# Patient Record
Sex: Male | Born: 1955 | Race: White | Hispanic: No | Smoking: Former smoker
Health system: Southern US, Community
[De-identification: ages and names within clinical notes are randomized; demographics above are authoritative.]

## PROBLEM LIST (undated history)

## (undated) DIAGNOSIS — M1990 Unspecified osteoarthritis, unspecified site: Secondary | ICD-10-CM

## (undated) DIAGNOSIS — I5020 Unspecified systolic (congestive) heart failure: Secondary | ICD-10-CM

## (undated) DIAGNOSIS — I350 Nonrheumatic aortic (valve) stenosis: Secondary | ICD-10-CM

## (undated) DIAGNOSIS — I10 Essential (primary) hypertension: Secondary | ICD-10-CM

## (undated) DIAGNOSIS — I2510 Atherosclerotic heart disease of native coronary artery without angina pectoris: Secondary | ICD-10-CM

## (undated) DIAGNOSIS — J449 Chronic obstructive pulmonary disease, unspecified: Secondary | ICD-10-CM

## (undated) DIAGNOSIS — I509 Heart failure, unspecified: Secondary | ICD-10-CM

## (undated) DIAGNOSIS — I428 Other cardiomyopathies: Secondary | ICD-10-CM

## (undated) DIAGNOSIS — J45909 Unspecified asthma, uncomplicated: Secondary | ICD-10-CM

## (undated) DIAGNOSIS — R0600 Dyspnea, unspecified: Secondary | ICD-10-CM

## (undated) DIAGNOSIS — I499 Cardiac arrhythmia, unspecified: Secondary | ICD-10-CM

## (undated) DIAGNOSIS — R0602 Shortness of breath: Secondary | ICD-10-CM

## (undated) DIAGNOSIS — J189 Pneumonia, unspecified organism: Secondary | ICD-10-CM

## (undated) DIAGNOSIS — R011 Cardiac murmur, unspecified: Secondary | ICD-10-CM

## (undated) HISTORY — PX: CATARACT EXTRACTION: SUR2

## (undated) HISTORY — DX: Nonrheumatic aortic (valve) stenosis: I35.0

## (undated) HISTORY — DX: Shortness of breath: R06.02

## (undated) HISTORY — DX: Chronic obstructive pulmonary disease, unspecified: J44.9

---
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
## 2015-12-06 LAB — PULMONARY FUNCTION TEST

## 2015-12-19 ENCOUNTER — Telehealth: Payer: Self-pay | Admitting: Internal Medicine

## 2015-12-19 NOTE — Telephone Encounter (Signed)
Received records from [facility] for appointment on 01/19/16 with Dr Rennis Golden.  Records given to [facility] (medical records) for Dr Blanchie Dessert schedule on 01/19/16. lp

## 2016-01-19 ENCOUNTER — Encounter: Payer: Self-pay | Admitting: Internal Medicine

## 2016-01-19 ENCOUNTER — Telehealth: Payer: Self-pay | Admitting: Internal Medicine

## 2016-01-19 ENCOUNTER — Other Ambulatory Visit: Payer: Self-pay | Admitting: *Deleted

## 2016-01-19 ENCOUNTER — Ambulatory Visit
Admission: RE | Admit: 2016-01-19 | Discharge: 2016-01-19 | Disposition: A | Discharge status: Home / Self Care | Payer: BLUE CROSS/BLUE SHIELD | Source: Ambulatory Visit | Attending: Internal Medicine | Admitting: Internal Medicine

## 2016-01-19 ENCOUNTER — Ambulatory Visit (INDEPENDENT_AMBULATORY_CARE_PROVIDER_SITE_OTHER): Payer: BLUE CROSS/BLUE SHIELD | Admitting: Internal Medicine

## 2016-01-19 VITALS — BP 98/68 | HR 79 | Ht 69.5 in | Wt 168.6 lb

## 2016-01-19 DIAGNOSIS — Z01818 Encounter for other preprocedural examination: Secondary | ICD-10-CM

## 2016-01-19 DIAGNOSIS — I429 Cardiomyopathy, unspecified: Secondary | ICD-10-CM

## 2016-01-19 DIAGNOSIS — I350 Nonrheumatic aortic (valve) stenosis: Secondary | ICD-10-CM

## 2016-01-19 DIAGNOSIS — Z79899 Other long term (current) drug therapy: Secondary | ICD-10-CM

## 2016-01-19 DIAGNOSIS — R0602 Shortness of breath: Secondary | ICD-10-CM | POA: Diagnosis not present

## 2016-01-19 DIAGNOSIS — I428 Other cardiomyopathies: Secondary | ICD-10-CM | POA: Insufficient documentation

## 2016-01-19 DIAGNOSIS — R5383 Other fatigue: Secondary | ICD-10-CM

## 2016-01-19 DIAGNOSIS — I959 Hypotension, unspecified: Secondary | ICD-10-CM | POA: Insufficient documentation

## 2016-01-19 DIAGNOSIS — Z13220 Encounter for screening for lipoid disorders: Secondary | ICD-10-CM

## 2016-01-19 DIAGNOSIS — D689 Coagulation defect, unspecified: Secondary | ICD-10-CM

## 2016-01-19 DIAGNOSIS — I340 Nonrheumatic mitral (valve) insufficiency: Secondary | ICD-10-CM

## 2016-01-19 NOTE — Progress Notes (Signed)
OFFICE NOTE  Chief Complaint:  Shortness of breath  Primary Care Physician: Abner Greenspan, MD  HPI:  [name] is a 60 y.o. male he was kindly referred to me for evaluation of new onset shortness of breath. He saw his primary care provider who had been following him for an abnormal cardiac murmur in the past. In fact [name] had previously seen a cardiologist in [place], probably more than 5 years ago who did an echocardiogram and then she had valvular heart disease which could get worse over time. He had not followed up with his cardiologist. On presentation last month to his primary care provider he was notably short of breath and underwent a chest x-ray which showed some mild pulmonary edema as well as COPD changes. He underwent office spirometry which indicated obstruction and possible restriction with some bronchodilator reversibility and an FEV1 less than 80% predicted. Subsequently he was referred to an echocardiogram which was performed at [facility]. I reviewed the interpretation which indicates mild LV dilatation and mild global hypokinesis with an EF of 45-50%. There was marketed dilatation of the left atrium, mild aortic insufficiency and severe aortic stenosis. The peak and mean gradients across the valve or 71 mmHg and 50 mmHg, respectively with a calculated aortic valve area of 0.62 cm. There was also mild to moderate mitral regurgitation. The study was performed on 12/01/2015. [name], but is still able to do physical activities. He denies any chest pain. Family history indicates death by natural causes of family members but no known coronary disease.  PMHx:  Past Medical History  Diagnosis Date  . SOBOE (shortness of breath on exertion)   . Severe aortic stenosis   . COPD (chronic obstructive pulmonary disease) [facility])     Past Surgical History  Procedure Laterality Date  . Cataract extraction       FAMHx:  Family History  Problem Relation Age of Onset  . Breast cancer Mother   . Breast cancer Sister     SOCHx:   reports that he has been smoking.  He does not have any smokeless tobacco history on file. His alcohol and drug histories are not on file.  ALLERGIES:  No Known Allergies  ROS: Pertinent items noted in HPI and remainder of comprehensive ROS otherwise negative.  HOME MEDS: Current Outpatient Prescriptions  Medication Sig Dispense Refill  . albuterol (PROVENTIL) (5 MG/ML) 0.5% nebulizer solution Take 2.5 mg by nebulization 2 (two) times daily as needed for wheezing or shortness of breath.    Ailene Ards ELLIPTA 62.5-25 MCG/INH AEPB Take 1 Inhaler by mouth daily.  1  . aspirin 325 MG tablet Take 325 mg by mouth daily.    Marland Kitchen BREO ELLIPTA 100-25 MCG/INH AEPB USE 1 INHALATION DAILY  0  . doxycycline (VIBRA-TABS) 100 MG tablet Take 100 mg by mouth 2 (two) times daily. For 14 days  0  . finasteride (PROSCAR) 5 MG tablet Take 5 mg by mouth daily.  1  . tamsulosin (FLOMAX) 0.4 MG CAPS capsule Take 0.4 mg by mouth daily.  1   No current facility-administered medications for this visit.    LABS/IMAGING: No results found for this or any previous visit (from the past 48 hour(s)). No results found.  WEIGHTS: Wt Readings from Last 3 Encounters:  01/19/16 168 lb 9.6 oz (76.476 kg)    VITALS: BP 98/68 mmHg  Pulse 79  Ht 5' 9.5" (1.765  m)  Wt 168 lb 9.6 oz (76.476 kg)  BMI 24.55 kg/m2  EXAM: General appearance: alert, no distress and thin Neck: JVD - 3 cm above sternal notch and no carotid bruit Lungs: diminished breath sounds bilaterally and rales bibasilar Heart: regular rate and rhythm, S1: normal, S2: decreased intensity and systolic murmur: late systolic 3/6, crescendo at 2nd right intercostal space Abdomen: soft, non-tender; bowel sounds normal; no masses,  no organomegaly Extremities: extremities normal, atraumatic, no cyanosis or edema Pulses: 1+ pulses Skin:  Cool extremities Neurologic: Grossly normal Psych: Pleasant  EKG: Sinus rhythm with frequent PVCs at 79, left atrial enlargement, LVH with repolarization changes and poor R-wave progression anteriorly  ASSESSMENT: 1. 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 Mild to moderate mitral regurgitation with "marked" left atrial enlargement 3. COPD and ongoing tobacco abuse 4. Dilated cardiomyopathy with EF 45-50% 5. Arterial hypotension with decreased peripheral pulses, no claudication  PLAN: 1.   Mr. Nylund has severe aortic stenosis by clinical auscultation of his murmur and recent echocardiogram findings from [place]. I will request the images to review personally. I discussed the natural history of aortic stenosis in this case suspect that it may be either rheumatic or bicuspid etiology but clearly he has become symptomatic with this. He will need an expedited preoperative workup including left heart catheterization next week. We did discuss the risks and benefits of this procedure as well as alternatives and he is willing to proceed. He does appear mildly clinically volume overloaded on exam today however with his low blood pressure and hasn't added diuretic. I like to get lab work including a comprehensive metabolic profile, lipid profile, CBC, BNP and preoperative coagulation studies as soon as possible. He will need referral to CT surgery once we have completed preoperative workup. Advised that if his symptoms worsen over the next several days to weeks that he should present, hospital urgently for expedited workup.  Thanks for the kind referral.  Chrystie Nose, MD, [facility] Attending Cardiologist [facility]  Chrystie Nose 01/19/2016, 10:59 AM

## 2016-01-19 NOTE — Patient Instructions (Signed)
Your physician has requested that you have a cardiac catheterization @ [facility] - NEXT WEEK (with Dr. Rennis Golden preferably). Cardiac catheterization is used to diagnose and/or treat various heart conditions. Doctors may recommend this procedure for a number of different reasons. The most common reason is to evaluate chest pain. Chest pain can be a symptom of coronary artery disease (CAD), and cardiac catheterization can show whether plaque is narrowing or blocking your heart's arteries. This procedure is also used to evaluate the valves, as well as measure the blood flow and oxygen levels in different parts of your heart. For further information please visit [email or web address] Please follow instruction sheet, as given.  Following your catheterization, you will not be allowed to drive for 3 days.  No lifting, pushing, or pulling greater that 10 pounds is allowed for 1 week.  You will be required to have the following tests prior to the procedure:  1. Blood work - the blood work can be done no more than 14 days prior to the procedure.  It can be done at any [facility] lab. There is a lab downstairs on the first floor of this building in [address] and one at [address]. -- CMET, lipid, BNP, PT, PTT, CBC, TSH  2. Chest X-ray - this can be done at [facility] in the [facility] @ [address]

## 2016-01-19 NOTE — Telephone Encounter (Signed)
Wife called in - informed we have work note ready to be picked up. She states they checked the VM i left earlier but are back home. She requested that I email the work note to [email or web address]. Letter emailed per request. Advised to call back if not received by 4pm

## 2016-01-19 NOTE — Telephone Encounter (Signed)
Addressed in another telephone note.  

## 2016-01-19 NOTE — Telephone Encounter (Signed)
Returning your call. °

## 2016-01-19 NOTE — Telephone Encounter (Signed)
Called patient - no answer LM for wife that Dr. Rennis Golden wants him out of work at least 3 weeks and a letter stating this is ready to be picked up.

## 2016-01-20 LAB — LIPID PANEL
Cholesterol: 158 mg/dL (ref 125–200)
HDL: 67 mg/dL (ref >=40)
LDL Cholesterol: 80 mg/dL (ref <130)
Total CHOL/HDL Ratio: 2.4 Ratio (ref <=5.0)
Triglycerides: 55 mg/dL (ref <150)
VLDL: 11 mg/dL (ref <30)

## 2016-01-20 LAB — APTT: APTT: 30 s (ref 22–34)

## 2016-01-20 LAB — CBC
HCT: 49 % (ref 38.5–50.0)
HEMOGLOBIN: 16.4 g/dL (ref 13.2–17.1)
MCH: 32.6 pg (ref 27.0–33.0)
MCHC: 33.5 g/dL (ref 32.0–36.0)
MCV: 97.4 fL (ref 80.0–100.0)
MPV: 10.6 fL (ref 7.5–12.5)
PLATELETS: 152 10*3/uL (ref 140–400)
RBC: 5.03 MIL/uL (ref 4.20–5.80)
RDW: 13.8 % (ref 11.0–15.0)
WBC: 6.9 10*3/uL (ref 3.8–10.8)

## 2016-01-20 LAB — PROTIME-INR
INR: 1.1
Prothrombin Time: 11.2 s (ref 9.0–11.5)

## 2016-01-20 LAB — COMPREHENSIVE METABOLIC PANEL
ALBUMIN: 3.8 g/dL (ref 3.6–5.1)
ALT: 22 U/L (ref 9–46)
AST: 16 U/L (ref 10–35)
Alkaline Phosphatase: 38 U/L — ABNORMAL LOW (ref 40–115)
BILIRUBIN TOTAL: 0.6 mg/dL (ref 0.2–1.2)
BUN: 16 mg/dL (ref 7–25)
CO2: 25 mmol/L (ref 20–31)
CREATININE: 0.83 mg/dL (ref 0.70–1.25)
Calcium: 8.9 mg/dL (ref 8.6–10.3)
Chloride: 103 mmol/L (ref 98–110)
Glucose, Bld: 91 mg/dL (ref 65–99)
Potassium: 4.6 mmol/L (ref 3.5–5.3)
SODIUM: 140 mmol/L (ref 135–146)
TOTAL PROTEIN: 6.1 g/dL (ref 6.1–8.1)

## 2016-01-20 LAB — TSH: TSH: 2.38 m[IU]/L (ref 0.40–4.50)

## 2016-01-20 LAB — BRAIN NATRIURETIC PEPTIDE: BRAIN NATRIURETIC PEPTIDE: 1100.6 pg/mL — AB (ref <100)

## 2016-01-23 ENCOUNTER — Encounter (HOSPITAL_COMMUNITY)
Admission: AD | Disposition: A | Discharge status: Home / Self Care | Payer: Self-pay | Source: Ambulatory Visit | Attending: Cardiothoracic Surgery

## 2016-01-23 ENCOUNTER — Encounter (HOSPITAL_COMMUNITY): Payer: Self-pay | Admitting: General Practice

## 2016-01-23 ENCOUNTER — Other Ambulatory Visit: Payer: Self-pay | Admitting: *Deleted

## 2016-01-23 ENCOUNTER — Inpatient Hospital Stay (HOSPITAL_COMMUNITY): Payer: BLUE CROSS/BLUE SHIELD

## 2016-01-23 ENCOUNTER — Inpatient Hospital Stay (HOSPITAL_COMMUNITY)
Admission: AD | Admit: 2016-01-23 | Discharge: 2016-02-05 | DRG: 216 | Disposition: A | Discharge status: Home / Self Care | Payer: BLUE CROSS/BLUE SHIELD | Source: Ambulatory Visit | Attending: Cardiothoracic Surgery | Admitting: Cardiothoracic Surgery

## 2016-01-23 DIAGNOSIS — I429 Cardiomyopathy, unspecified: Secondary | ICD-10-CM | POA: Diagnosis not present

## 2016-01-23 DIAGNOSIS — N401 Enlarged prostate with lower urinary tract symptoms: Secondary | ICD-10-CM | POA: Diagnosis present

## 2016-01-23 DIAGNOSIS — Z9119 Patient's noncompliance with other medical treatment and regimen: Secondary | ICD-10-CM

## 2016-01-23 DIAGNOSIS — Q231 Congenital insufficiency of aortic valve: Secondary | ICD-10-CM

## 2016-01-23 DIAGNOSIS — F17200 Nicotine dependence, unspecified, uncomplicated: Secondary | ICD-10-CM | POA: Diagnosis present

## 2016-01-23 DIAGNOSIS — I481 Persistent atrial fibrillation: Secondary | ICD-10-CM | POA: Diagnosis not present

## 2016-01-23 DIAGNOSIS — I350 Nonrheumatic aortic (valve) stenosis: Secondary | ICD-10-CM | POA: Diagnosis not present

## 2016-01-23 DIAGNOSIS — J410 Simple chronic bronchitis: Secondary | ICD-10-CM | POA: Diagnosis not present

## 2016-01-23 DIAGNOSIS — I313 Pericardial effusion (noninflammatory): Secondary | ICD-10-CM | POA: Diagnosis present

## 2016-01-23 DIAGNOSIS — I959 Hypotension, unspecified: Secondary | ICD-10-CM | POA: Diagnosis not present

## 2016-01-23 DIAGNOSIS — D696 Thrombocytopenia, unspecified: Secondary | ICD-10-CM | POA: Diagnosis not present

## 2016-01-23 DIAGNOSIS — I509 Heart failure, unspecified: Secondary | ICD-10-CM

## 2016-01-23 DIAGNOSIS — R338 Other retention of urine: Secondary | ICD-10-CM | POA: Diagnosis present

## 2016-01-23 DIAGNOSIS — I480 Paroxysmal atrial fibrillation: Secondary | ICD-10-CM | POA: Diagnosis not present

## 2016-01-23 DIAGNOSIS — Z6823 Body mass index (BMI) 23.0-23.9, adult: Secondary | ICD-10-CM

## 2016-01-23 DIAGNOSIS — E43 Unspecified severe protein-calorie malnutrition: Secondary | ICD-10-CM | POA: Diagnosis present

## 2016-01-23 DIAGNOSIS — I4891 Unspecified atrial fibrillation: Secondary | ICD-10-CM

## 2016-01-23 DIAGNOSIS — I9589 Other hypotension: Secondary | ICD-10-CM | POA: Diagnosis present

## 2016-01-23 DIAGNOSIS — I5021 Acute systolic (congestive) heart failure: Secondary | ICD-10-CM

## 2016-01-23 DIAGNOSIS — D62 Acute posthemorrhagic anemia: Secondary | ICD-10-CM | POA: Diagnosis not present

## 2016-01-23 DIAGNOSIS — R64 Cachexia: Secondary | ICD-10-CM | POA: Diagnosis not present

## 2016-01-23 DIAGNOSIS — Z01818 Encounter for other preprocedural examination: Secondary | ICD-10-CM

## 2016-01-23 DIAGNOSIS — I2510 Atherosclerotic heart disease of native coronary artery without angina pectoris: Secondary | ICD-10-CM | POA: Diagnosis present

## 2016-01-23 DIAGNOSIS — R0602 Shortness of breath: Secondary | ICD-10-CM

## 2016-01-23 DIAGNOSIS — I352 Nonrheumatic aortic (valve) stenosis with insufficiency: Principal | ICD-10-CM | POA: Diagnosis present

## 2016-01-23 DIAGNOSIS — I340 Nonrheumatic mitral (valve) insufficiency: Secondary | ICD-10-CM | POA: Diagnosis present

## 2016-01-23 DIAGNOSIS — I9789 Other postprocedural complications and disorders of the circulatory system, not elsewhere classified: Secondary | ICD-10-CM | POA: Diagnosis not present

## 2016-01-23 DIAGNOSIS — Z952 Presence of prosthetic heart valve: Secondary | ICD-10-CM

## 2016-01-23 DIAGNOSIS — I472 Ventricular tachycardia: Secondary | ICD-10-CM | POA: Diagnosis not present

## 2016-01-23 DIAGNOSIS — N400 Enlarged prostate without lower urinary tract symptoms: Secondary | ICD-10-CM | POA: Diagnosis not present

## 2016-01-23 DIAGNOSIS — I428 Other cardiomyopathies: Secondary | ICD-10-CM

## 2016-01-23 DIAGNOSIS — J939 Pneumothorax, unspecified: Secondary | ICD-10-CM

## 2016-01-23 DIAGNOSIS — J449 Chronic obstructive pulmonary disease, unspecified: Secondary | ICD-10-CM | POA: Diagnosis present

## 2016-01-23 HISTORY — PX: CARDIAC CATHETERIZATION: SHX172

## 2016-01-23 HISTORY — DX: Atherosclerotic heart disease of native coronary artery without angina pectoris: I25.10

## 2016-01-23 HISTORY — DX: Other cardiomyopathies: I42.8

## 2016-01-23 LAB — ECHOCARDIOGRAM COMPLETE
AOPV: 0.14 m/s
AOVTI: 99.1 cm
AV Area VTI index: 0.31 cm2/m2
AV Area VTI: 0.57 cm2
AV VEL mean LVOT/AV: 0.14
AV peak Index: 0.3
AVAREAMEANV: 0.57 cm2
AVAREAMEANVIN: 0.3 cm2/m2
AVG: 48 mmHg
AVPG: 79 mmHg
AVPKVEL: 445 cm/s
CHL CUP AV VEL: 0.6
DOP CAL AO MEAN VELOCITY: 322 cm/s
FS: 13 % — AB (ref 28–44)
Height: 69.5 in
IVS/LV PW RATIO, ED: 0.97
LA ID, A-P, ES: 47 mm
LA diam end sys: 47 mm
LA diam index: 2.44 cm/m2
LA vol index: 49.5 mL/m2
LAVOL: 95.6 mL
LAVOLA4C: 81 mL
LV PW d: 10.4 mm — AB (ref 0.6–1.1)
LVOT SV: 59 mL
LVOT VTI: 14.3 cm
LVOT area: 4.15 cm2
LVOT diameter: 23 mm
LVOT peak VTI: 0.14 cm
LVOT peak vel: 61.3 cm/s
Lateral S' vel: 10.8 cm/s
RV TAPSE: 19.4 mm
Valve area index: 0.31
Valve area: 0.6 cm2
Weight: 2704 oz

## 2016-01-23 LAB — POCT I-STAT 3, VENOUS BLOOD GAS (G3P V)
BICARBONATE: 24.6 meq/L — AB (ref 20.0–24.0)
O2 SAT: 58 %
PO2 VEN: 31 mmHg (ref 31.0–45.0)
TCO2: 26 mmol/L (ref 0–100)
pCO2, Ven: 40.8 mmHg — ABNORMAL LOW (ref 45.0–50.0)
pH, Ven: 7.388 — ABNORMAL HIGH (ref 7.250–7.300)

## 2016-01-23 LAB — CBC
HCT: 46 % (ref 39.0–52.0)
Hemoglobin: 15.5 g/dL (ref 13.0–17.0)
MCH: 32.6 pg (ref 26.0–34.0)
MCHC: 33.7 g/dL (ref 30.0–36.0)
MCV: 96.6 fL (ref 78.0–100.0)
PLATELETS: 125 10*3/uL — AB (ref 150–400)
RBC: 4.76 MIL/uL (ref 4.22–5.81)
RDW: 13.5 % (ref 11.5–15.5)
WBC: 5 10*3/uL (ref 4.0–10.5)

## 2016-01-23 LAB — URINALYSIS, ROUTINE W REFLEX MICROSCOPIC
Bilirubin Urine: NEGATIVE
Glucose, UA: NEGATIVE mg/dL
Hgb urine dipstick: NEGATIVE
Ketones, ur: NEGATIVE mg/dL
Leukocytes, UA: NEGATIVE
Nitrite: NEGATIVE
Protein, ur: NEGATIVE mg/dL
Specific Gravity, Urine: 1.018 (ref 1.005–1.030)
pH: 5 (ref 5.0–8.0)

## 2016-01-23 LAB — CREATININE, SERUM: CREATININE: 0.69 mg/dL (ref 0.61–1.24)

## 2016-01-23 LAB — POCT I-STAT 3, ART BLOOD GAS (G3+)
Acid-base deficit: 3 mmol/L — ABNORMAL HIGH (ref 0.0–2.0)
Bicarbonate: 21.5 mEq/L (ref 20.0–24.0)
O2 SAT: 91 %
PCO2 ART: 36.1 mmHg (ref 35.0–45.0)
TCO2: 23 mmol/L (ref 0–100)
pH, Arterial: 7.383 (ref 7.350–7.450)
pO2, Arterial: 62 mmHg — ABNORMAL LOW (ref 80.0–100.0)

## 2016-01-23 LAB — SURGICAL PCR SCREEN
MRSA, PCR: NEGATIVE
Staphylococcus aureus: NEGATIVE

## 2016-01-23 SURGERY — RIGHT/LEFT HEART CATH AND CORONARY ANGIOGRAPHY

## 2016-01-23 MED ORDER — SODIUM CHLORIDE 0.9 % IV SOLN: 250.0000 mL | INTRAVENOUS | Status: DC | PRN

## 2016-01-23 MED ORDER — ENSURE ENLIVE PO LIQD
237.0000 mL | Freq: Two times a day (BID) | ORAL | Status: DC
  Administered 2016-01-24 (×2): 237 mL via ORAL

## 2016-01-23 MED ORDER — SODIUM CHLORIDE 0.9% FLUSH
3.0000 mL | Freq: Two times a day (BID) | INTRAVENOUS | Status: DC
  Administered 2016-01-24 – 2016-01-28 (×9): 3 mL via INTRAVENOUS

## 2016-01-23 MED ORDER — SODIUM CHLORIDE 0.9% FLUSH: 3.0000 mL | INTRAVENOUS | Status: DC | PRN

## 2016-01-23 MED ORDER — ASPIRIN 81 MG PO CHEW
81.0000 mg | CHEWABLE_TABLET | Freq: Every day | ORAL | Status: DC
  Administered 2016-01-24 – 2016-01-28 (×5): 81 mg via ORAL
  Filled 2016-01-23 (×5): qty 1

## 2016-01-23 MED ORDER — IOPAMIDOL (ISOVUE-370) INJECTION 76%
INTRAVENOUS | Status: AC
  Filled 2016-01-23: qty 100

## 2016-01-23 MED ORDER — VERAPAMIL HCL 2.5 MG/ML IV SOLN
INTRAVENOUS | Status: AC
  Filled 2016-01-23: qty 2

## 2016-01-23 MED ORDER — PNEUMOCOCCAL VAC POLYVALENT 25 MCG/0.5ML IJ INJ
0.5000 mL | INJECTION | INTRAMUSCULAR | Status: AC
  Administered 2016-01-24: 0.5 mL via INTRAMUSCULAR
  Filled 2016-01-23: qty 0.5

## 2016-01-23 MED ORDER — SODIUM CHLORIDE 0.9% FLUSH
3.0000 mL | Freq: Two times a day (BID) | INTRAVENOUS | Status: DC
  Administered 2016-01-23 – 2016-01-27 (×6): 3 mL via INTRAVENOUS

## 2016-01-23 MED ORDER — SODIUM CHLORIDE 0.9% FLUSH
3.0000 mL | Freq: Two times a day (BID) | INTRAVENOUS | Status: DC
  Administered 2016-01-24 – 2016-01-27 (×5): 3 mL via INTRAVENOUS

## 2016-01-23 MED ORDER — TAMSULOSIN HCL 0.4 MG PO CAPS
0.4000 mg | ORAL_CAPSULE | Freq: Every day | ORAL | Status: DC
  Administered 2016-01-23 – 2016-01-25 (×3): 0.4 mg via ORAL
  Filled 2016-01-23 (×3): qty 1

## 2016-01-23 MED ORDER — HEPARIN (PORCINE) IN NACL 2-0.9 UNIT/ML-% IJ SOLN
INTRAMUSCULAR | Status: AC
  Filled 2016-01-23: qty 1000

## 2016-01-23 MED ORDER — FINASTERIDE 5 MG PO TABS
5.0000 mg | ORAL_TABLET | Freq: Every day | ORAL | Status: DC
  Administered 2016-01-23 – 2016-02-05 (×13): 5 mg via ORAL
  Filled 2016-01-23 (×13): qty 1

## 2016-01-23 MED ORDER — ALBUTEROL SULFATE (2.5 MG/3ML) 0.083% IN NEBU: 2.5000 mg | INHALATION_SOLUTION | Freq: Two times a day (BID) | RESPIRATORY_TRACT | Status: DC | PRN

## 2016-01-23 MED ORDER — LIDOCAINE HCL (PF) 1 % IJ SOLN
INTRAMUSCULAR | Status: AC
  Filled 2016-01-23: qty 30

## 2016-01-23 MED ORDER — ONDANSETRON HCL 4 MG/2ML IJ SOLN: 4.0000 mg | Freq: Four times a day (QID) | INTRAMUSCULAR | Status: DC | PRN

## 2016-01-23 MED ORDER — HEPARIN (PORCINE) IN NACL 2-0.9 UNIT/ML-% IJ SOLN
INTRAMUSCULAR | Status: DC | PRN
  Administered 2016-01-23: 1500 mL

## 2016-01-23 MED ORDER — LIDOCAINE HCL (PF) 1 % IJ SOLN
INTRAMUSCULAR | Status: DC | PRN
  Administered 2016-01-23: 10 mL
  Administered 2016-01-23: 20 mL
  Administered 2016-01-23: 1 mL
  Administered 2016-01-23: 20 mL
  Administered 2016-01-23: 2 mL

## 2016-01-23 MED ORDER — SODIUM CHLORIDE 0.9 % IV SOLN
INTRAVENOUS | Status: DC
Start: 2016-01-23 — End: 2016-01-23
  Administered 2016-01-23: 09:00:00 via INTRAVENOUS

## 2016-01-23 MED ORDER — FENTANYL CITRATE (PF) 100 MCG/2ML IJ SOLN
INTRAMUSCULAR | Status: DC | PRN
  Administered 2016-01-23: 25 ug via INTRAVENOUS

## 2016-01-23 MED ORDER — UMECLIDINIUM BROMIDE 62.5 MCG/INH IN AEPB
1.0000 | INHALATION_SPRAY | Freq: Every day | RESPIRATORY_TRACT | Status: DC
  Filled 2016-01-23: qty 7

## 2016-01-23 MED ORDER — UMECLIDINIUM BROMIDE 62.5 MCG/INH IN AEPB
1.0000 | INHALATION_SPRAY | Freq: Every day | RESPIRATORY_TRACT | Status: DC
  Administered 2016-01-24 – 2016-02-05 (×12): 1 via RESPIRATORY_TRACT
  Filled 2016-01-23 (×3): qty 7

## 2016-01-23 MED ORDER — FENTANYL CITRATE (PF) 100 MCG/2ML IJ SOLN
INTRAMUSCULAR | Status: AC
  Filled 2016-01-23: qty 2

## 2016-01-23 MED ORDER — FLUTICASONE FUROATE-VILANTEROL 100-25 MCG/INH IN AEPB
1.0000 | INHALATION_SPRAY | Freq: Every day | RESPIRATORY_TRACT | Status: DC
  Filled 2016-01-23: qty 28

## 2016-01-23 MED ORDER — FLUTICASONE FUROATE-VILANTEROL 100-25 MCG/INH IN AEPB
1.0000 | INHALATION_SPRAY | Freq: Every day | RESPIRATORY_TRACT | Status: DC
  Administered 2016-01-24 – 2016-02-05 (×12): 1 via RESPIRATORY_TRACT
  Filled 2016-01-23 (×3): qty 28

## 2016-01-23 MED ORDER — HEPARIN SODIUM (PORCINE) 5000 UNIT/ML IJ SOLN
5000.0000 [IU] | Freq: Three times a day (TID) | INTRAMUSCULAR | Status: DC
  Administered 2016-01-23 – 2016-01-28 (×16): 5000 [IU] via SUBCUTANEOUS
  Filled 2016-01-23 (×16): qty 1

## 2016-01-23 MED ORDER — ASPIRIN 81 MG PO CHEW: 81.0000 mg | CHEWABLE_TABLET | ORAL | Status: DC

## 2016-01-23 MED ORDER — ACETAMINOPHEN 325 MG PO TABS: 650.0000 mg | ORAL_TABLET | ORAL | Status: DC | PRN

## 2016-01-23 MED ORDER — FUROSEMIDE 10 MG/ML IJ SOLN
40.0000 mg | Freq: Two times a day (BID) | INTRAMUSCULAR | Status: DC
  Administered 2016-01-23 – 2016-01-24 (×2): 40 mg via INTRAVENOUS
  Filled 2016-01-23 (×3): qty 4

## 2016-01-23 MED ORDER — IOPAMIDOL (ISOVUE-370) INJECTION 76%
INTRAVENOUS | Status: DC | PRN
  Administered 2016-01-23: 60 mL via INTRAVENOUS

## 2016-01-23 SURGICAL SUPPLY — 18 items
CATH BALLN WEDGE 5F 110CM (CATHETERS) ×2 IMPLANT
CATH INFINITI 5FR MULTPACK ANG (CATHETERS) ×2 IMPLANT
CATH SWAN GANZ 7F STRAIGHT (CATHETERS) ×2 IMPLANT
COVER PRB 48X5XTLSCP FOLD TPE (BAG) ×1 IMPLANT
COVER PROBE 5X48 (BAG) ×1
GLIDESHEATH SLEND SS 6F .021 (SHEATH) ×2 IMPLANT
KIT HEART LEFT (KITS) ×2 IMPLANT
NEEDLE SMART REG 18GX2-3/4 (NEEDLE) ×2 IMPLANT
PACK CARDIAC CATHETERIZATION (CUSTOM PROCEDURE TRAY) ×2 IMPLANT
SHEATH FAST CATH BRACH 5F 5CM (SHEATH) ×2 IMPLANT
SHEATH PINNACLE 5F 10CM (SHEATH) ×2 IMPLANT
SHEATH PINNACLE 7F 10CM (SHEATH) ×2 IMPLANT
SYR MEDRAD MARK V 150ML (SYRINGE) IMPLANT
TRANSDUCER W/STOPCOCK (MISCELLANEOUS) ×2 IMPLANT
TUBING CIL FLEX 10 FLL-RA (TUBING) ×2 IMPLANT
WIRE EMERALD 3MM-J .025X260CM (WIRE) ×2 IMPLANT
WIRE EMERALD 3MM-J .035X150CM (WIRE) ×2 IMPLANT
WIRE SAFE-T 1.5MM-J .035X260CM (WIRE) ×2 IMPLANT

## 2016-01-23 NOTE — Plan of Care (Signed)
Problem: Phase I Progression Outcomes Goal: Voiding-avoid urinary catheter unless indicated Outcome: Completed/Met Date Met: 01/23/16 Pt voiding well in urinal. Pt has voided about 2L this shift.  Goal: Vascular site scale level 0 - I Vascular Site Scale Level 0: No bruising/bleeding/hematoma Level I (Mild): Bruising/Ecchymosis, minimal bleeding/ooozing, palpable hematoma < 3 cm Level II (Moderate): Bleeding not affecting hemodynamic parameters, pseudoaneurysm, palpable hematoma > 3 cm Level III  (Severe) Bleeding which affects hemodynamic parameters or retroperitoneal hemorrhage   Outcome: Completed/Met Date Met: 01/23/16 Pt's groin is a level 0. Pt's dressing was removed & a bandaid was placed.

## 2016-01-23 NOTE — Progress Notes (Signed)
5 FR Arterial sheath and 7 FR venous sheath removed from right femoral area without complication. Groin is a level zero at this time and patient has been given instructions. Site bandage applied at this time.

## 2016-01-23 NOTE — Progress Notes (Signed)
  Echocardiogram 2D Echocardiogram has been performed.  Delcie Roch 01/23/2016, 4:25 PM

## 2016-01-23 NOTE — Plan of Care (Signed)
Problem: Phase I Progression Outcomes Goal: Voiding-avoid urinary catheter unless indicated Outcome: Completed/Met Date Met: 01/23/16 Pt is voiding well in his urinal. Goal: Vascular site scale level 0 - I Vascular Site Scale Level 0: No bruising/bleeding/hematoma Level I (Mild): Bruising/Ecchymosis, minimal bleeding/ooozing, palpable hematoma < 3 cm Level II (Moderate): Bleeding not affecting hemodynamic parameters, pseudoaneurysm, palpable hematoma > 3 cm Level III  (Severe) Bleeding which affects hemodynamic parameters or retroperitoneal hemorrhage   Outcome: Completed/Met Date Met: 01/23/16 Pt's vascular site is a level 0. Pt's gauze was removed &  A band-aid was placed.

## 2016-01-23 NOTE — Progress Notes (Signed)
Patient blood pressure while laying in bed with head of bed elevated 83/57, retake was 89/65.  RN entered room to assess and patient had just got done using urinal (stood beside bed to use) and was sitting on side of bed when RN entered room.  Blood pressure taken and was 92/62.  Patient asymptomatic denies lightheadedness and dizziness even with position changes.  Azeem on call with Cardiology paged and returned page, was updated of all information in this note.  No new orders at this time.  Will continue to monitor.

## 2016-01-23 NOTE — H&P (Signed)
     INTERVAL PROCEDURE H&P  History and Physical Interval Note:  01/23/2016 8:48 AM  [name] has presented today for their planned procedure. The various methods of treatment have been discussed with the patient and family. After consideration of risks, benefits and other options for treatment, the patient has consented to the procedure.  The patients' outpatient history has been reviewed, patient examined, and no change in status from most recent office note within the past 30 days. I have reviewed the patients' chart and labs and will proceed as planned. Questions were answered to the patient's satisfaction.   Chrystie Nose, MD, [facility] Attending Cardiologist [facility]  Chrystie Nose 01/23/2016, 8:48 AM

## 2016-01-24 ENCOUNTER — Inpatient Hospital Stay (HOSPITAL_COMMUNITY): Payer: BLUE CROSS/BLUE SHIELD

## 2016-01-24 ENCOUNTER — Encounter (HOSPITAL_COMMUNITY): Payer: Self-pay | Admitting: Cardiology

## 2016-01-24 DIAGNOSIS — I5021 Acute systolic (congestive) heart failure: Secondary | ICD-10-CM

## 2016-01-24 DIAGNOSIS — I959 Hypotension, unspecified: Secondary | ICD-10-CM

## 2016-01-24 DIAGNOSIS — I429 Cardiomyopathy, unspecified: Secondary | ICD-10-CM

## 2016-01-24 DIAGNOSIS — I350 Nonrheumatic aortic (valve) stenosis: Secondary | ICD-10-CM

## 2016-01-24 DIAGNOSIS — I2510 Atherosclerotic heart disease of native coronary artery without angina pectoris: Secondary | ICD-10-CM | POA: Diagnosis present

## 2016-01-24 DIAGNOSIS — J449 Chronic obstructive pulmonary disease, unspecified: Secondary | ICD-10-CM | POA: Diagnosis present

## 2016-01-24 DIAGNOSIS — N400 Enlarged prostate without lower urinary tract symptoms: Secondary | ICD-10-CM | POA: Diagnosis present

## 2016-01-24 LAB — BASIC METABOLIC PANEL
Anion gap: 8 (ref 5–15)
BUN: 22 mg/dL — ABNORMAL HIGH (ref 6–20)
CALCIUM: 9.3 mg/dL (ref 8.9–10.3)
CO2: 25 mmol/L (ref 22–32)
CREATININE: 0.69 mg/dL (ref 0.61–1.24)
Chloride: 106 mmol/L (ref 101–111)
GFR calc non Af Amer: >60 mL/min (ref >60)
Glucose, Bld: 104 mg/dL — ABNORMAL HIGH (ref 65–99)
Potassium: 4.1 mmol/L (ref 3.5–5.1)
SODIUM: 139 mmol/L (ref 135–145)

## 2016-01-24 MED ORDER — MORPHINE SULFATE (PF) 4 MG/ML IV SOLN: 4.0000 mg | INTRAVENOUS | Status: DC | PRN

## 2016-01-24 MED ORDER — ENSURE ENLIVE PO LIQD
237.0000 mL | Freq: Three times a day (TID) | ORAL | Status: DC
  Administered 2016-01-24 – 2016-01-28 (×10): 237 mL via ORAL

## 2016-01-24 MED FILL — Verapamil HCl IV Soln 2.5 MG/ML: INTRAVENOUS | Qty: 2 | Status: AC

## 2016-01-24 NOTE — Progress Notes (Signed)
CARDIAC REHAB PHASE I   PRE:  Rate/Rhythm: 68 SR c/ PVCs  BP:  Sitting: 93/73        SaO2: 95 RA  MODE:  Ambulation: 550 ft   POST:  Rate/Rhythm: 93 SR c/ PVCs  BP:  Sitting: 97/76         SaO2: 94 RA  Pt ambulated 550 ft on RA, assist x1, steady gait, tolerated well. Pt c/o mild DOE, states it is much improved, denies any other complaints, appreciative of walk. Cardiac surgery pre-op education completed with pt and wife at bedside. Reviewed IS, sternal precautions, activity progression, cardiac surgery booklet and cardiac surgery guidelines. Left instructions to view cardiac surgery videos as well. Pt and wife verbalized understanding. Pt to recliner after walk, call bell within reach. Will follow.   [id number] Joylene Grapes, RN, BSN 01/24/2016 11:55 AM

## 2016-01-24 NOTE — Progress Notes (Signed)
Initial Nutrition Assessment  DOCUMENTATION CODES:   Severe malnutrition in context of chronic illness  INTERVENTION:    Ensure Enlive PO TID, each supplement provides 350 kcal and 20 grams of protein  NUTRITION DIAGNOSIS:   Malnutrition related to chronic illness as evidenced by severe depletion of body fat, severe depletion of muscle mass, percent weight loss (15%).  GOAL:   Patient will meet greater than or equal to 90% of their needs  MONITOR:   PO intake, Supplement acceptance, Weight trends, Labs, I & O's  REASON FOR ASSESSMENT:   Malnutrition Screening Tool    ASSESSMENT:   60 year old male who presented to the hospital on 7/25 with severe aortic stenosis, class four CHF with severe LV dysfunction and no significant coronary artery disease.   Patient reports that he has been losing weight for the past 6 months or so. He has been eating well, but continues to lose weight. He has had 15% weight loss over the past 6 months. Patient with good intake of meals today. Wife is bringing in food from home. Discussed ways to increase protein intake. Patient likes Ensure.  Nutrition-Focused physical exam completed. Findings are severe fat depletion, severe muscle depletion, and no edema.  Patient with severe PCM. Labs reviewed. Medications reviewed and include lasix, flomax.  Diet Order:  Diet Heart Room service appropriate? Yes; Fluid consistency: Thin  Skin:  Reviewed, no issues  Last BM:  7/25  Height:   Ht Readings from Last 1 Encounters:  01/23/16 5' 9.5" (1.765 m)    Weight:   Wt Readings from Last 1 Encounters:  01/24/16 162 lb 1.6 oz (73.5 kg)    Ideal Body Weight:  74.1 kg  BMI:  Body mass index is 23.59 kg/m.  Estimated Nutritional Needs:   Kcal:  2000-2200  Protein:  100-110 gm  Fluid:  2 L  EDUCATION NEEDS:   No education needs identified at this time  Joaquin Courts, RD, LDN, CNSC Pager [phone] After Hours Pager [phone]

## 2016-01-24 NOTE — Progress Notes (Signed)
Subjective:  SOB improved from admission  Objective:  Vital Signs in the last 24 hours: Temp:  [97.6 F (36.4 C)-98.2 F (36.8 C)] 97.6 F (36.4 C) (07/26 0413) Pulse Rate:  [0-93] 76 (07/26 0413) Resp:  [0-35] 13 (07/26 0413) BP: (83-118)/(57-88) 94/66 (07/26 0413) SpO2:  [0 %-96 %] 92 % (07/26 0413) Weight:  [162 lb 1.6 oz (73.5 kg)] 162 lb 1.6 oz (73.5 kg) (07/26 0413)  Intake/Output from previous day:  Intake/Output Summary (Last 24 hours) at 01/24/16 0834 Last data filed at 01/24/16 0414  Gross per 24 hour  Intake              240 ml  Output             1825 ml  Net            -1585 ml    Physical Exam: General appearance: alert, cooperative, cachectic and no distress Lungs: decreased breath sounds, few crackles Lt base Heart: regular rate and rhythm and 2/6 systolic murmur, decfeased S2 Extremities: no edema Skin: Skin color, texture, turgor normal. No rashes or lesions Neurologic: Grossly normal   Rate: 76  Rhythm: normal sinus rhythm, premature ventricular contractions (PVC) and 4 bt NSVT  Lab Results:  Recent Labs  01/23/16 1436  WBC 5.0  HGB 15.5  PLT 125*    Recent Labs  01/23/16 1436 01/24/16 0539  NA  --  139  K  --  4.1  CL  --  106  CO2  --  25  GLUCOSE  --  104*  BUN  --  22*  CREATININE 0.69 0.69   No results for input(s): TROPONINI in the last 72 hours.  Invalid input(s): CK, MB No results for input(s): INR in the last 72 hours.  Scheduled Meds: . aspirin  81 mg Oral Daily  . feeding supplement (ENSURE ENLIVE)  237 mL Oral BID BM  . finasteride  5 mg Oral Daily  . fluticasone furoate-vilanterol  1 puff Inhalation Daily  . furosemide  40 mg Intravenous BID  . heparin  5,000 Units Subcutaneous Q8H  . sodium chloride flush  3 mL Intravenous Q12H  . sodium chloride flush  3 mL Intravenous Q12H  . sodium chloride flush  3 mL Intravenous Q12H  . tamsulosin  0.4 mg Oral Daily  . umeclidinium bromide  1 puff Inhalation Daily    Continuous Infusions:  PRN Meds:.sodium chloride, sodium chloride, sodium chloride, acetaminophen, albuterol, ondansetron (ZOFRAN) IV, sodium chloride flush, sodium chloride flush, sodium chloride flush   Imaging: Imaging results have been reviewed  Cardiac Studies: Echo 01/23/16 Study Conclusions  - Left ventricle: The cavity size was severely dilated. Systolic   function was moderately to severely reduced. The estimated   ejection fraction was in the range of 30% to 35%. Diffuse   hypokinesis. - Aortic valve: Valve mobility was restricted. There was severe   stenosis. There was moderate regurgitation. Peak velocity (S):   445 cm/s. Mean gradient (S): 48 mm Hg. Valve area (VTI): 0.6   cm^2. Valve area (Vmax): 0.57 cm^2. Valve area (Vmean): 0.57   cm^2. - Mitral valve: Transvalvular velocity was within the normal range.   There was no evidence for stenosis. There was mild regurgitation. - Left atrium: The atrium was severely dilated. - Right ventricle: The cavity size was normal. Wall thickness was   normal. Systolic function was normal. - Right atrium: The atrium was mildly dilated. - Tricuspid valve: There was  no regurgitation. - Inferior vena cava: The vessel was normal in size. The   respirophasic diameter changes were in the normal range (>= 50%),   consistent with normal central venous pressure. - Pericardium, extracardiac: A trivial pericardial effusion was   identified. There was a large left pleural effusion.   Assessment/Plan:   60 y.o. Caucasian male with a history of COPD, AS, and cardiomyopathy, refered to Dr Rennis Golden for new onset dyspnea. Pt noted to be in pulmonary edema, documented by CXR and BNP. He has diuresed 1.5L -7 lbs. Chronic low B/P has otherwise limited medical Rx. Cath done 01/23/16 showed minor CAD, echo showed EF 30-35% with severe AS.   Principal Problem:   Acute systolic (congestive) heart failure (HCC) Active Problems:   Severe aortic stenosis    Non-ischemic cardiomyopathy (HCC)   COPD (chronic obstructive pulmonary disease) (HCC)   Arterial hypotension   CAD- minor CAD at cath    BPH (benign prostatic hyperplasia)   PLAN: MD to see- pt is to get another dose of IV Lasix this am.  Corine Shelter PA-C 01/24/2016, 8:34 AM [phone]

## 2016-01-24 NOTE — Progress Notes (Signed)
1 Day Post-Op Procedure(s) (LRB): Right/Left Heart Cath and Coronary Angiography (N/A) Subjective: Patient examined, echocardiogram, coronary angiogram and right heart cath data personally reviewed and counseled with patient.  Patient presents with severe aortic stenosis, class for CHF with severe LV dysfunction and no significant coronary artery disease. The patient has bilateral pleural effusions and had respiratory distress on admission. The patient has improved with diuretics. He is in sinus rhythm. The patient also has significant COPD and active smoking. Because the patient's severe LV dysfunction and class IV symptoms he should remain hospitalized, his medical condition should be optimized including pulmonary status then aortic valve placement later this admission. Plan surgery  on Monday, July 31.  Objective: Vital signs in last 24 hours: Temp:  [97.6 F (36.4 C)-98.2 F (36.8 C)] 97.6 F (36.4 C) (07/26 0413) Pulse Rate:  [0-93] 76 (07/26 0413) Cardiac Rhythm: Normal sinus rhythm;Bundle branch block (07/26 0703) Resp:  [0-35] 13 (07/26 0413) BP: (83-118)/(57-88) 94/66 (07/26 0413) SpO2:  [0 %-96 %] 92 % (07/26 0413) Weight:  [162 lb 1.6 oz (73.5 kg)] 162 lb 1.6 oz (73.5 kg) (07/26 0413)  Hemodynamic parameters for last 24 hours:  sinus rhythm  Intake/Output from previous day: 07/25 0701 - 07/26 0700 In: 240 [P.O.:240] Out: 1825 [Urine:1825] Intake/Output this shift: No intake/output data recorded.      Physical Exam  General: Chronically ill middle-aged Caucasian male no acute distress HEENT: Normocephalic pupils equal , dentition adequate Neck: Supple without JVD, adenopathy, or bruit Chest: Basilar rales, symmetrical breath sounds, scattered rhonchi rhonchi, no tenderness             or deformity Cardiovascular: Regular rate and rhythm, 3/6 murmur of AS, mild AI murmur, no gallop, peripheral pulses             palpable in all extremities Abdomen:  Soft, nontender,  no palpable mass or organomegaly Extremities: Warm, well-perfused, no clubbing cyanosis edema or tenderness,              no venous stasis changes of the legs Rectal/GU: Deferred Neuro: Grossly non--focal and symmetrical throughout Skin: Clean and dry without rash or ulceration   Lab Results:  Recent Labs  01/23/16 1436  WBC 5.0  HGB 15.5  HCT 46.0  PLT 125*   BMET:  Recent Labs  01/23/16 1436 01/24/16 0539  NA  --  139  K  --  4.1  CL  --  106  CO2  --  25  GLUCOSE  --  104*  BUN  --  22*  CREATININE 0.69 0.69  CALCIUM  --  9.3    PT/INR: No results for input(s): LABPROT, INR in the last 72 hours. ABG    Component Value Date/Time   PHART 7.383 01/23/2016 1136   HCO3 24.6 (H) 01/23/2016 1136   HCO3 21.5 01/23/2016 1136   TCO2 26 01/23/2016 1136   TCO2 23 01/23/2016 1136   ACIDBASEDEF 3.0 (H) 01/23/2016 1136   O2SAT 58.0 01/23/2016 1136   O2SAT 91.0 01/23/2016 1136   CBG (last 3)  No results for input(s): GLUCAP in the last 72 hours.  Assessment/Plan: S/P Procedure(s) (LRB): Right/Left Heart Cath and Coronary Angiography (N/A) Continue with aggressive diuresis for wedge of 28 mmHg Continue bronchodilator therapy for COPD Plan aortic valve replacement with a biologic valve on Monday, July 31.   LOS: 1 day    [name] 01/24/2016

## 2016-01-25 ENCOUNTER — Inpatient Hospital Stay (HOSPITAL_COMMUNITY): Payer: BLUE CROSS/BLUE SHIELD

## 2016-01-25 ENCOUNTER — Encounter (HOSPITAL_COMMUNITY): Payer: BLUE CROSS/BLUE SHIELD

## 2016-01-25 DIAGNOSIS — I350 Nonrheumatic aortic (valve) stenosis: Secondary | ICD-10-CM

## 2016-01-25 DIAGNOSIS — J410 Simple chronic bronchitis: Secondary | ICD-10-CM

## 2016-01-25 LAB — BASIC METABOLIC PANEL
Anion gap: 7 (ref 5–15)
BUN: 28 mg/dL — ABNORMAL HIGH (ref 6–20)
CALCIUM: 9.2 mg/dL (ref 8.9–10.3)
CO2: 27 mmol/L (ref 22–32)
CREATININE: 0.77 mg/dL (ref 0.61–1.24)
Chloride: 104 mmol/L (ref 101–111)
GFR calc non Af Amer: >60 mL/min (ref >60)
GLUCOSE: 108 mg/dL — AB (ref 65–99)
Potassium: 3.9 mmol/L (ref 3.5–5.1)
Sodium: 138 mmol/L (ref 135–145)

## 2016-01-25 LAB — VAS US DOPPLER PRE CABG
LEFT ECA DIAS: -14 cm/s
LEFT VERTEBRAL DIAS: -10 cm/s
Left CCA dist dias: 22 cm/s
Left CCA dist sys: 64 cm/s
Left CCA prox dias: 20 cm/s
Left CCA prox sys: 70 cm/s
Left ICA dist dias: -33 cm/s
Left ICA dist sys: -71 cm/s
Left ICA prox dias: -20 cm/s
Left ICA prox sys: -48 cm/s
RIGHT ECA DIAS: -12 cm/s
Right CCA prox dias: 14 cm/s
Right CCA prox sys: 45 cm/s
Right cca dist sys: -42 cm/s

## 2016-01-25 LAB — HEMOGLOBIN A1C
Hgb A1c MFr Bld: 5.5 % (ref 4.8–5.6)
Mean Plasma Glucose: 111 mg/dL

## 2016-01-25 MED ORDER — FUROSEMIDE 40 MG PO TABS: 40.0000 mg | ORAL_TABLET | Freq: Every day | ORAL | Status: DC

## 2016-01-25 MED ORDER — ALBUMIN HUMAN 25 % IV SOLN
12.5000 g | Freq: Four times a day (QID) | INTRAVENOUS | Status: AC
  Administered 2016-01-25 – 2016-01-26 (×2): 12.5 g via INTRAVENOUS
  Filled 2016-01-25 (×3): qty 50

## 2016-01-25 NOTE — Progress Notes (Signed)
BP 89/56 40 IV lasix held. Pt asymptomatic. Lungs clear. 02 sats 96% room air. Cards paged.

## 2016-01-25 NOTE — Progress Notes (Signed)
Pre-op Cardiac Surgery  Carotid Findings:  No evidence of a significant stenosis noted in bilateral carotid arteries 1-39%.  Upper Extremity Right Left  Brachial Pressures 93 86  Radial Waveforms Triphasic Triphasic  Ulnar Waveforms Triphasic Triphasic  Palmar Arch (Allen's Test) WNL WNL   Findings:   Bilateral palmar arch evaluation appeared within normal limits

## 2016-01-25 NOTE — Progress Notes (Signed)
   Discussed case with Dr. Rennis Golden. Patient is hypotensive with systolic pressures in the upper 70s low 80s, however he is completely asymptomatic. His lasix was held today but he did get Flomax at 0830. We will hold Lasix and Flomax for now. No fluids for now given EF of 30% and fact that he is asymptomatic. RN notified. Hopefully his BP will improve after surgery. Will continue to monitor.

## 2016-01-25 NOTE — Plan of Care (Signed)
Problem: Phase I Progression Outcomes Goal: Pain controlled with appropriate interventions Outcome: Progressing No complaints of pain at this time.  BP 81/66 Cardiology made aware. Discussed continuing to hold lasix, and possibly flomax in am. Pt asymptomatic. Pt educated on not standing alone, call light within reach. Wife at bedside. Sitting in chair comfortably. Will continue to monitor.

## 2016-01-25 NOTE — Care Management Note (Addendum)
Case Management Note  Patient Details  Name: [name] MRN: [id number] Date of Birth: 04-15-1956  Subjective/Objective:  Pt presented for CHF. Pt is from home with family support of wife.                   Action/Plan: CM discussed HH Needs with patient and the significance to daily weights and monitoring nutrition and fluid intake. Per pt he will not need a HHRN at d/c. No further needs from CM at this time.   Expected Discharge Date:                  Expected Discharge Plan:  Home/Self Care  In-House Referral:  NA  Discharge planning Services  CM Consult  Post Acute Care Choice:  NA Choice offered to:  NA  DME Arranged:  N/A DME Agency:  NA  HH Arranged:  NA HH Agency:     Status of Service:  Completed, signed off  If discussed at Long Length of Stay Meetings, dates discussed:    Additional Comments: 01-26-16 [address] Tomi Bamberger, RN,BSN [phone] Cath done 01/23/16 showed minor CAD, echo showed EF 30-35% with severe AS. Plan for AVR on Monday. CM will reassess home needs before d/c.    Elbert Ewings Lamar Laundry, RN 01/25/2016, 3:06 PM

## 2016-01-25 NOTE — Progress Notes (Signed)
Subjective:  No events overnight. Diuresed an additional 1L negative overnight. Now 2.5L Negative. Blood pressure remains low. Creatinine is up slightly at 0.77 (from 0.69)  Objective:  Vital Signs in the last 24 hours: Temp:  [97.3 F (36.3 C)-98.2 F (36.8 C)] 97.7 F (36.5 C) (07/27 0844) Pulse Rate:  [28-77] 65 (07/27 0830) Resp:  [15-31] 21 (07/27 0830) BP: (82-107)/(50-76) 89/56 (07/27 0830) SpO2:  [91 %-95 %] 95 % (07/27 0920) Weight:  [161 lb 12.8 oz (73.4 kg)] 161 lb 12.8 oz (73.4 kg) (07/27 0401)  Intake/Output from previous day:  Intake/Output Summary (Last 24 hours) at 01/25/16 0955 Last data filed at 01/25/16 1610  Gross per 24 hour  Intake              480 ml  Output             1451 ml  Net             -971 ml    Physical Exam: General appearance: alert, cooperative, cachectic and no distress Lungs: decreased breath sounds, few crackles Lt base Heart: regular rate and rhythm and 3/6 systolic murmur, decreased S2 Extremities: no edema Skin: Skin color, texture, turgor normal. No rashes or lesions Neurologic: Grossly normal   Rate: 76  Rhythm: normal sinus rhythm  Lab Results:  Recent Labs  01/23/16 1436  WBC 5.0  HGB 15.5  PLT 125*    Recent Labs  01/24/16 0539 01/25/16 0258  NA 139 138  K 4.1 3.9  CL 106 104  CO2 25 27  GLUCOSE 104* 108*  BUN 22* 28*  CREATININE 0.69 0.77   No results for input(s): TROPONINI in the last 72 hours.  Invalid input(s): CK, MB No results for input(s): INR in the last 72 hours.  Scheduled Meds: . aspirin  81 mg Oral Daily  . feeding supplement (ENSURE ENLIVE)  237 mL Oral TID BM  . finasteride  5 mg Oral Daily  . fluticasone furoate-vilanterol  1 puff Inhalation Daily  . furosemide  40 mg Intravenous BID  . heparin  5,000 Units Subcutaneous Q8H  . sodium chloride flush  3 mL Intravenous Q12H  . sodium chloride flush  3 mL Intravenous Q12H  . sodium chloride flush  3 mL Intravenous Q12H  .  tamsulosin  0.4 mg Oral Daily  . umeclidinium bromide  1 puff Inhalation Daily   Continuous Infusions:  PRN Meds:.sodium chloride, sodium chloride, sodium chloride, acetaminophen, albuterol, morphine injection, ondansetron (ZOFRAN) IV, sodium chloride flush, sodium chloride flush, sodium chloride flush   Imaging: Imaging results have been reviewed  Cardiac Studies: Echo 01/23/16 Study Conclusions  - Left ventricle: The cavity size was severely dilated. Systolic   function was moderately to severely reduced. The estimated   ejection fraction was in the range of 30% to 35%. Diffuse   hypokinesis. - Aortic valve: Valve mobility was restricted. There was severe   stenosis. There was moderate regurgitation. Peak velocity (S):   445 cm/s. Mean gradient (S): 48 mm Hg. Valve area (VTI): 0.6   cm^2. Valve area (Vmax): 0.57 cm^2. Valve area (Vmean): 0.57   cm^2. - Mitral valve: Transvalvular velocity was within the normal range.   There was no evidence for stenosis. There was mild regurgitation. - Left atrium: The atrium was severely dilated. - Right ventricle: The cavity size was normal. Wall thickness was   normal. Systolic function was normal. - Right atrium: The atrium was mildly dilated. -  Tricuspid valve: There was no regurgitation. - Inferior vena cava: The vessel was normal in size. The   respirophasic diameter changes were in the normal range (>= 50%),   consistent with normal central venous pressure. - Pericardium, extracardiac: A trivial pericardial effusion was   identified. There was a large left pleural effusion.   Assessment/Plan:   60 y.o. Caucasian male with a history of COPD, AS, and cardiomyopathy, refered to Dr Rennis Golden for new onset dyspnea. Pt noted to be in pulmonary edema, documented by CXR and BNP. He has diuresed 1.5L -7 lbs. Chronic low B/P has otherwise limited medical Rx. Cath done 01/23/16 showed minor CAD, echo showed EF 30-35% with severe AS.   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 Good response to diuresis - lasix held last night due to hypotension. Will switch to po lasix today for slower diuresis and noted that creatinine starting to rise, but still normal. Monitor bp - he has been asymptomatic with this. Plan for AVR on Monday.  Chrystie Nose, MD, [facility] Attending Cardiologist [facility] 01/25/2016, 9:55 AM [phone]

## 2016-01-26 ENCOUNTER — Inpatient Hospital Stay (HOSPITAL_COMMUNITY): Payer: BLUE CROSS/BLUE SHIELD

## 2016-01-26 DIAGNOSIS — N400 Enlarged prostate without lower urinary tract symptoms: Secondary | ICD-10-CM

## 2016-01-26 LAB — PULMONARY FUNCTION TEST
DL/VA % pred: 93 %
DL/VA: 4.2 ml/min/mmHg/L
DLCO cor % pred: 67 %
DLCO cor: 20.11 ml/min/mmHg
DLCO unc % pred: 69 %
DLCO unc: 20.6 ml/min/mmHg
FEF 25-75 Post: 1.77 L/sec
FEF 25-75 Pre: 1.98 L/sec
FEF2575-%Change-Post: -10 %
FEF2575-%Pred-Post: 66 %
FEF2575-%Pred-Pre: 74 %
FEV1-%Change-Post: -2 %
FEV1-%Pred-Post: 69 %
FEV1-%Pred-Pre: 71 %
FEV1-Post: 2.28 L
FEV1-Pre: 2.35 L
FEV1FVC-%Change-Post: 0 %
FEV1FVC-%Pred-Pre: 101 %
FEV6-%Change-Post: -2 %
FEV6-%Pred-Post: 72 %
FEV6-%Pred-Pre: 74 %
FEV6-Post: 3 L
FEV6-Pre: 3.08 L
FEV6FVC-%Change-Post: 0 %
FEV6FVC-%Pred-Post: 104 %
FEV6FVC-%Pred-Pre: 105 %
FVC-%Change-Post: -2 %
FVC-%Pred-Post: 68 %
FVC-%Pred-Pre: 70 %
FVC-Post: 3.01 L
FVC-Pre: 3.08 L
Post FEV1/FVC ratio: 76 %
Post FEV6/FVC ratio: 100 %
Pre FEV1/FVC ratio: 76 %
Pre FEV6/FVC Ratio: 100 %
RV % pred: 94 %
RV: 2.07 L
TLC % pred: 86 %
TLC: 5.73 L

## 2016-01-26 LAB — BASIC METABOLIC PANEL
Anion gap: 7 (ref 5–15)
BUN: 24 mg/dL — AB (ref 6–20)
CALCIUM: 9.1 mg/dL (ref 8.9–10.3)
CO2: 27 mmol/L (ref 22–32)
CREATININE: 0.74 mg/dL (ref 0.61–1.24)
Chloride: 105 mmol/L (ref 101–111)
GFR calc Af Amer: >60 mL/min (ref >60)
GFR calc non Af Amer: >60 mL/min (ref >60)
GLUCOSE: 104 mg/dL — AB (ref 65–99)
Potassium: 4.3 mmol/L (ref 3.5–5.1)
Sodium: 139 mmol/L (ref 135–145)

## 2016-01-26 MED ORDER — ALBUTEROL SULFATE (2.5 MG/3ML) 0.083% IN NEBU
2.5000 mg | INHALATION_SOLUTION | Freq: Once | RESPIRATORY_TRACT | Status: AC
Start: 2016-01-26 — End: 2016-01-26
  Administered 2016-01-26: 2.5 mg via RESPIRATORY_TRACT

## 2016-01-26 NOTE — Progress Notes (Signed)
Subjective:  No events overnight. Labs stable today. Lasix held yesterday due to hypotension. He is 2L negative. Flomax held as well. BP still low - close to baseline and asymptomatic.  Objective:  Vital Signs in the last 24 hours: Temp:  [97.9 F (36.6 C)-98 F (36.7 C)] 98 F (36.7 C) (07/28 0424) Pulse Rate:  [35-83] 79 (07/28 0424) Resp:  [15-31] 19 (07/28 0424) BP: (77-100)/(48-71) 95/69 (07/28 0424) SpO2:  [90 %-95 %] 90 % (07/28 0424) Weight:  [165 lb 3.2 oz (74.9 kg)] 165 lb 3.2 oz (74.9 kg) (07/28 0424)  Intake/Output from previous day:  Intake/Output Summary (Last 24 hours) at 01/26/16 0854 Last data filed at 01/26/16 0425  Gross per 24 hour  Intake             1250 ml  Output              775 ml  Net              475 ml    Physical Exam: General appearance: alert, cooperative, cachectic and no distress Lungs: decreased breath sounds, few crackles Lt base Heart: regular rate and rhythm and 3/6 systolic murmur, decreased S2 Extremities: no edema Skin: Skin color, texture, turgor normal. No rashes or lesions Neurologic: Grossly normal   Rate: 76  Rhythm: normal sinus rhythm  Lab Results:  Recent Labs  01/23/16 1436  WBC 5.0  HGB 15.5  PLT 125*    Recent Labs  01/25/16 0258 01/26/16 0418  NA 138 139  K 3.9 4.3  CL 104 105  CO2 27 27  GLUCOSE 108* 104*  BUN 28* 24*  CREATININE 0.77 0.74   No results for input(s): TROPONINI in the last 72 hours.  Invalid input(s): CK, MB No results for input(s): INR in the last 72 hours.  Scheduled Meds: . aspirin  81 mg Oral Daily  . feeding supplement (ENSURE ENLIVE)  237 mL Oral TID BM  . finasteride  5 mg Oral Daily  . fluticasone furoate-vilanterol  1 puff Inhalation Daily  . heparin  5,000 Units Subcutaneous Q8H  . sodium chloride flush  3 mL Intravenous Q12H  . sodium chloride flush  3 mL Intravenous Q12H  . sodium chloride flush  3 mL Intravenous Q12H  . umeclidinium bromide  1 puff  Inhalation Daily   Continuous Infusions:  PRN Meds:.sodium chloride, sodium chloride, sodium chloride, acetaminophen, albuterol, morphine injection, ondansetron (ZOFRAN) IV, sodium chloride flush, sodium chloride flush, sodium chloride flush   Imaging: Imaging results have been reviewed  Cardiac Studies: Echo 01/23/16 Study Conclusions  - Left ventricle: The cavity size was severely dilated. Systolic   function was moderately to severely reduced. The estimated   ejection fraction was in the range of 30% to 35%. Diffuse   hypokinesis. - Aortic valve: Valve mobility was restricted. There was severe   stenosis. There was moderate regurgitation. Peak velocity (S):   445 cm/s. Mean gradient (S): 48 mm Hg. Valve area (VTI): 0.6   cm^2. Valve area (Vmax): 0.57 cm^2. Valve area (Vmean): 0.57   cm^2. - Mitral valve: Transvalvular velocity was within the normal range.   There was no evidence for stenosis. There was mild regurgitation. - Left atrium: The atrium was severely dilated. - Right ventricle: The cavity size was normal. Wall thickness was   normal. Systolic function was normal. - Right atrium: The atrium was mildly dilated. - Tricuspid valve: There was no regurgitation. - Inferior vena cava:  The vessel was normal in size. The   respirophasic diameter changes were in the normal range (>= 50%),   consistent with normal central venous pressure. - Pericardium, extracardiac: A trivial pericardial effusion was   identified. There was a large left pleural effusion.   Assessment/Plan:   60 y.o. Caucasian male with a history of COPD, AS, and cardiomyopathy, refered to Dr Rennis Golden for new onset dyspnea. Pt noted to be in pulmonary edema, documented by CXR and BNP. He has diuresed 1.5L -7 lbs. Chronic low B/P has otherwise limited medical Rx. Cath done 01/23/16 showed minor CAD, echo showed EF 30-35% with severe AS.   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 Good response to diuresis. 2L negative, but lasix held due to hypotension. Flomax held as well, however, bp remains low. Suspect it will not improve until after surgery on Monday.  Chrystie Nose, MD, [facility] Attending Cardiologist [facility] 01/26/2016, 8:54 AM [phone]

## 2016-01-27 LAB — SURGICAL PCR SCREEN
MRSA, PCR: NEGATIVE
STAPHYLOCOCCUS AUREUS: NEGATIVE

## 2016-01-27 NOTE — Progress Notes (Signed)
Patient ID: [name], male   DOB: [birth year], 60 y.o.   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 No syncope.  CURRENT MEDS . aspirin  81 mg Oral Daily  . feeding supplement (ENSURE ENLIVE)  237 mL Oral TID BM  . finasteride  5 mg Oral Daily  . fluticasone furoate-vilanterol  1 puff Inhalation Daily  . heparin  5,000 Units Subcutaneous Q8H  . sodium chloride flush  3 mL Intravenous Q12H  . sodium chloride flush  3 mL Intravenous Q12H  . sodium chloride flush  3 mL Intravenous Q12H  . umeclidinium bromide  1 puff Inhalation Daily    OBJECTIVE  Vitals:   01/27/16 0807 01/27/16 1008 01/27/16 1009 01/27/16 1208  BP: 98/64   92/68  Pulse: 71   70  Resp: 18   (!) 25  Temp: 97.6 F (36.4 C)   97.9 F (36.6 C)  TempSrc: Oral   Oral  SpO2: 92% 95% 95% 97%  Weight:      Height:        Intake/Output Summary (Last 24 hours) at 01/27/16 1455 Last data filed at 01/27/16 1209  Gross per 24 hour  Intake              480 ml  Output             1775 ml  Net            -1295 ml   Filed Weights   01/25/16 0401 01/26/16 0424 01/27/16 0425  Weight: 161 lb 12.8 oz (73.4 kg) 165 lb 3.2 oz (74.9 kg) 167 lb 8 oz (76 kg)    PHYSICAL EXAM  General: Pleasant, NAD. Neuro: Alert and oriented X 3. Moves all extremities spontaneously. Psych: Normal affect. HEENT:  Normal  Neck: Supple without bruits or JVD. Lungs:  Resp regular and unlabored, CTA. Heart: RRR with a 2/6 systolic murmur. Reduced A2 Abdomen: Soft, non-tender, non-distended, BS + x 4.  Extremities: No clubbing, cyanosis or edema. DP/PT/Radials 2+ and equal  bilaterally.  Accessory Clinical Findings  CBC No results for input(s): WBC, NEUTROABS, HGB, HCT, MCV, PLT in the last 72 hours. Basic Metabolic Panel  Recent Labs  01/25/16 0258 01/26/16 0418  NA 138 139  K 3.9 4.3  CL 104 105  CO2 27 27  GLUCOSE 108* 104*  BUN 28* 24*  CREATININE 0.77 0.74  CALCIUM 9.2 9.1   Liver Function Tests No results for input(s): AST, ALT, ALKPHOS, BILITOT, PROT, ALBUMIN in the last 72 hours. No results for input(s): LIPASE, AMYLASE in the last 72 hours. Cardiac Enzymes No results for input(s): CKTOTAL, CKMB, CKMBINDEX, TROPONINI in the last 72 hours. BNP Invalid input(s): POCBNP D-Dimer No results for input(s): DDIMER in the last 72 hours. Hemoglobin A1C No results for input(s): HGBA1C in the last 72 hours. Fasting Lipid Panel No results for input(s): CHOL, HDL, LDLCALC, TRIG, CHOLHDL, LDLDIRECT in the last 72 hours. Thyroid Function Tests No results for input(s): TSH, T4TOTAL, T3FREE, THYROIDAB in the last 72 hours.  Invalid input(s): FREET3  TELE  nsr  Radiology/Studies  Dg Chest 2 View  Result Date: 01/19/2016 CLINICAL DATA:  Pre cardiac cath procedure, shortness of breath, swelling EXAM: CHEST  2 VIEW COMPARISON:  None available FINDINGS: Mild cardiomegaly with vascular congestion and interstitial prominence. Moderate pleural effusions present bilaterally with bibasilar compressive atelectasis/ consolidation. CHF is favored over pneumonia. Atherosclerosis of the aorta. No pneumothorax. Trachea is midline. IMPRESSION: Diffuse interstitial edema pattern with moderate pleural effusions. Compressive bibasilar atelectasis/consolidation. Electronically Signed   By: Judie Petit.  Shick M.D.   On: 01/19/2016 15:28   Ct Chest Wo Contrast  Result Date: 01/24/2016 CLINICAL DATA:  Aortic stenosis.  Cough last few days. EXAM: CT CHEST WITHOUT CONTRAST TECHNIQUE: Multidetector CT imaging of the chest was performed following the standard protocol without IV  contrast. COMPARISON:  Chest x-ray 01/19/2016 FINDINGS: Cardiovascular: Mild cardiomegaly. Mild calcified plaque over the left anterior descending, lateral circumflex and right coronary arteries. Calcified plaque over the thoracic aorta. Moderate calcification over the region of the aortic valve. Mild ectasia of the ascending thoracic aorta measuring 3.7 cm in AP diameter. Mediastinum/Nodes: No mediastinal or hilar adenopathy. Remaining mediastinal structures are within normal. Lungs/Pleura: Lungs are adequately inflated with mild centrilobular emphysematous disease over the mid to upper lungs. Tiny calcified granuloma over the right upper lobe. Moderate size bilateral pleural effusions with associated basilar consolidation likely compressive atelectasis. Airways are within normal. Upper Abdomen: 3.1 cm and 2.4 cm left adrenal adenomas. Minimal calcified plaque over the abdominal aorta. Musculoskeletal: Mild degenerate change of the spine. IMPRESSION: Moderate bilateral pleural effusions with associated bibasilar consolidation likely compressive atelectasis. Minimal emphysematous disease. Mild cardiomegaly with atherosclerotic coronary artery disease. Moderate calcification over the region of the aortic valve. Aortic atherosclerosis. Mild ectasia of the ascending thoracic aorta measuring 3.7 cm in AP diameter. Recommend annual imaging followup by CTA or MRA. This recommendation follows 2010 ACCF/AHA/AATS/ACR/ASA/SCA/SCAI/SIR/STS/SVM Guidelines for the Diagnosis and Management of Patients with Thoracic Aortic Disease. Circulation.2010; 121: M841-L244. Left adrenal adenomas. Electronically Signed   By: Elberta Fortis M.D.   On: 01/24/2016 11:52   ASSESSMENT AND PLAN  1. Aortic stenosis - he is pending aortic valve replacement on Monday. No questions.  2. Dyspnea - multi-factorial. He is improved using the incentive spirometry. 3. Non-obstructive CAD - no apparent indication for bypass at time of AVR.  Kayla Weekes,M.D.  01/27/2016 2:55 PM

## 2016-01-27 NOTE — Progress Notes (Signed)
4 Days Post-Op Procedure(s) (LRB): Right/Left Heart Cath and Coronary Angiography (N/A) Subjective: Severe AS with significant LV dysfunction, normal coronaries Maintaining sinus rhythm Stable waiting for aVR July 31 Complains of some shortness of breath with exertion today  Objective: Vital signs in last 24 hours: Temp:  [97.6 F (36.4 C)-98.2 F (36.8 C)] 97.9 F (36.6 C) (07/29 1208) Pulse Rate:  [68-75] 70 (07/29 1208) Cardiac Rhythm: Normal sinus rhythm (07/29 0817) Resp:  [18-25] 25 (07/29 1208) BP: (91-99)/(60-68) 92/68 (07/29 1208) SpO2:  [92 %-97 %] 97 % (07/29 1208) Weight:  [167 lb 8 oz (76 kg)] 167 lb 8 oz (76 kg) (07/29 0425)  Hemodynamic parameters for last 24 hours:  afebrile sinus Intake/Output from previous day: 07/28 0701 - 07/29 0700 In: -  Out: 900 [Urine:900] Intake/Output this shift: Total I/O In: 480 [P.O.:480] Out: 875 [Urine:875]  No evidence of edema Distant breath sounds Systolic murmur consistent with a ES  Lab Results: No results for input(s): WBC, HGB, HCT, PLT in the last 72 hours. BMET:  Recent Labs  01/25/16 0258 01/26/16 0418  NA 138 139  K 3.9 4.3  CL 104 105  CO2 27 27  GLUCOSE 108* 104*  BUN 28* 24*  CREATININE 0.77 0.74  CALCIUM 9.2 9.1    PT/INR: No results for input(s): LABPROT, INR in the last 72 hours. ABG    Component Value Date/Time   PHART 7.383 01/23/2016 1136   HCO3 24.6 (H) 01/23/2016 1136   HCO3 21.5 01/23/2016 1136   TCO2 26 01/23/2016 1136   TCO2 23 01/23/2016 1136   ACIDBASEDEF 3.0 (H) 01/23/2016 1136   O2SAT 58.0 01/23/2016 1136   O2SAT 91.0 01/23/2016 1136   CBG (last 3)  No results for input(s): GLUCAP in the last 72 hours.  Assessment/Plan: S/P Procedure(s) (LRB): Right/Left Heart Cath and Coronary Angiography (N/A) AVR tissue with  valve Monday, July 31   LOS: 4 days    [name] 01/27/2016

## 2016-01-28 LAB — PROTIME-INR
INR: 0.98
Prothrombin Time: 13 seconds (ref 11.4–15.2)

## 2016-01-28 LAB — ABO/RH: ABO/RH(D): O POS

## 2016-01-28 LAB — PREPARE RBC (CROSSMATCH)

## 2016-01-28 LAB — APTT: aPTT: 30 seconds (ref 24–36)

## 2016-01-28 MED ORDER — DIAZEPAM 5 MG PO TABS
5.0000 mg | ORAL_TABLET | Freq: Once | ORAL | Status: AC
  Administered 2016-01-29: 5 mg via ORAL
  Filled 2016-01-28: qty 1

## 2016-01-28 MED ORDER — SODIUM CHLORIDE 0.9 % IV SOLN
INTRAVENOUS | Status: AC
  Administered 2016-01-29: 70 mL/h via INTRAVENOUS
  Administered 2016-01-29: 14 mL/h via INTRAVENOUS
  Filled 2016-01-28: qty 40

## 2016-01-28 MED ORDER — NITROGLYCERIN IN D5W 200-5 MCG/ML-% IV SOLN
2.0000 ug/min | INTRAVENOUS | Status: DC
  Filled 2016-01-28: qty 250

## 2016-01-28 MED ORDER — DEXTROSE 5 % IV SOLN
750.0000 mg | INTRAVENOUS | Status: DC
  Filled 2016-01-28: qty 750

## 2016-01-28 MED ORDER — DEXTROSE 5 % IV SOLN
1.5000 g | INTRAVENOUS | Status: AC
  Administered 2016-01-29: 1.5 g via INTRAVENOUS
  Administered 2016-01-29: .75 g via INTRAVENOUS
  Filled 2016-01-28: qty 1.5

## 2016-01-28 MED ORDER — SODIUM CHLORIDE 0.9 % IV SOLN
INTRAVENOUS | Status: DC
  Filled 2016-01-28: qty 30

## 2016-01-28 MED ORDER — DOPAMINE-DEXTROSE 3.2-5 MG/ML-% IV SOLN
0.0000 ug/kg/min | INTRAVENOUS | Status: AC
  Administered 2016-01-29: 3 ug/kg/min via INTRAVENOUS
  Filled 2016-01-28: qty 250

## 2016-01-28 MED ORDER — TEMAZEPAM 15 MG PO CAPS: 15.0000 mg | ORAL_CAPSULE | Freq: Once | ORAL | Status: DC | PRN

## 2016-01-28 MED ORDER — ALPRAZOLAM 0.25 MG PO TABS: 0.2500 mg | ORAL_TABLET | ORAL | Status: DC | PRN

## 2016-01-28 MED ORDER — CHLORHEXIDINE GLUCONATE 0.12 % MT SOLN
15.0000 mL | Freq: Once | OROMUCOSAL | Status: AC
  Administered 2016-01-29: 15 mL via OROMUCOSAL

## 2016-01-28 MED ORDER — PLASMA-LYTE 148 IV SOLN
INTRAVENOUS | Status: AC
  Administered 2016-01-29: 500 mL
  Filled 2016-01-28: qty 2.5

## 2016-01-28 MED ORDER — MAGNESIUM SULFATE 50 % IJ SOLN
40.0000 meq | INTRAMUSCULAR | Status: DC
  Filled 2016-01-28: qty 10

## 2016-01-28 MED ORDER — SODIUM CHLORIDE 0.9 % IV SOLN
INTRAVENOUS | Status: AC
  Administered 2016-01-29: .8 [IU]/h via INTRAVENOUS
  Filled 2016-01-28: qty 2.5

## 2016-01-28 MED ORDER — PHENYLEPHRINE HCL 10 MG/ML IJ SOLN
30.0000 ug/min | INTRAMUSCULAR | Status: DC
  Filled 2016-01-28: qty 2

## 2016-01-28 MED ORDER — CHLORHEXIDINE GLUCONATE 4 % EX LIQD
60.0000 mL | Freq: Once | CUTANEOUS | Status: AC
  Administered 2016-01-29: 4 via TOPICAL
  Filled 2016-01-28: qty 60

## 2016-01-28 MED ORDER — POTASSIUM CHLORIDE 2 MEQ/ML IV SOLN
80.0000 meq | INTRAVENOUS | Status: DC
  Filled 2016-01-28: qty 40

## 2016-01-28 MED ORDER — BISACODYL 5 MG PO TBEC
5.0000 mg | DELAYED_RELEASE_TABLET | Freq: Once | ORAL | Status: AC
  Administered 2016-01-28: 5 mg via ORAL
  Filled 2016-01-28: qty 1

## 2016-01-28 MED ORDER — CHLORHEXIDINE GLUCONATE 4 % EX LIQD
60.0000 mL | Freq: Once | CUTANEOUS | Status: AC
  Administered 2016-01-28: 4 via TOPICAL
  Filled 2016-01-28: qty 60

## 2016-01-28 MED ORDER — VANCOMYCIN HCL 10 G IV SOLR
1250.0000 mg | INTRAVENOUS | Status: AC
  Administered 2016-01-29: 1250 mg via INTRAVENOUS
  Filled 2016-01-28: qty 1250

## 2016-01-28 MED ORDER — METOPROLOL TARTRATE 12.5 MG HALF TABLET
12.5000 mg | ORAL_TABLET | Freq: Once | ORAL | Status: AC
  Administered 2016-01-29: 12.5 mg via ORAL
  Filled 2016-01-28: qty 1

## 2016-01-28 MED ORDER — DEXMEDETOMIDINE HCL IN NACL 400 MCG/100ML IV SOLN
0.1000 ug/kg/h | INTRAVENOUS | Status: AC
  Administered 2016-01-29: .4 ug/kg/h via INTRAVENOUS
  Filled 2016-01-28: qty 100

## 2016-01-28 MED ORDER — EPINEPHRINE HCL 1 MG/ML IJ SOLN
0.0000 ug/min | INTRAMUSCULAR | Status: DC
  Filled 2016-01-28: qty 4

## 2016-01-28 NOTE — Plan of Care (Signed)
Problem: Consults Goal: Nutrition Consult-if indicated Outcome: Completed/Met Date Met: 01/28/16 Pt receiving ensure supplements per RD recs.

## 2016-01-28 NOTE — Progress Notes (Signed)
PATIENT ID: Mr. Kelsay is a 60 year old man with severe aortic stenosis, COPD, and chronic systolic and diastolic heart failure currently awaiting aortic valve replacement.  SUBJECTIVE:  Feeling well. Denies chest pain or shortness of breath. Denies lightheadedness or presyncope.   PHYSICAL EXAM Vitals:   01/27/16 1941 01/28/16 0010 01/28/16 0500 01/28/16 0803  BP: 90/63 90/64 108/73   Pulse: 75 70 72 65  Resp: (!) 23 17 (!) 25 (!) 24  Temp: 98 F (36.7 C) 97.9 F (36.6 C) 97.8 F (36.6 C) 97.4 F (36.3 C)  TempSrc:    Oral  SpO2: 96% 93% 92% 97%  Weight:   166 lb 4.8 oz (75.4 kg)   Height:       General:  Well-appearing. No acute distress Neck: No JVD Lungs:  Clear to auscultation bilaterally. No crackles, rhonchi, or wheezes. Heart:  Regular rate and rhythm.  III/VI late-peaking systolic murmur at the left upper sternal border. Normal S1. Diminished S2. Abdomen:  Soft, nontender, nondistended. Active bowel sounds Extremities:  Warm and well-perfused. No edema  LABS: No results found for: TROPONINI Results for orders placed or performed during the hospital encounter of 01/23/16 (from the past 24 hour(s))  Surgical pcr screen     Status: None   Collection Time: 01/27/16 11:55 AM  Result Value Ref Range   MRSA, PCR NEGATIVE NEGATIVE   Staphylococcus aureus NEGATIVE NEGATIVE    Intake/Output Summary (Last 24 hours) at 01/28/16 0906 Last data filed at 01/28/16 0600  Gross per 24 hour  Intake              863 ml  Output             2150 ml  Net            -1287 ml    Telemetry:  No events  Echo 01/23/16: Study Conclusions  - Left ventricle: The cavity size was severely dilated. Systolic   function was moderately to severely reduced. The estimated   ejection fraction was in the range of 30% to 35%. Diffuse   hypokinesis. - Aortic valve: Valve mobility was restricted. There was severe   stenosis. There was moderate regurgitation. Peak velocity (S):   445 cm/s.  Mean gradient (S): 48 mm Hg. Valve area (VTI): 0.6   cm^2. Valve area (Vmax): 0.57 cm^2. Valve area (Vmean): 0.57   cm^2. - Mitral valve: Transvalvular velocity was within the normal range.   There was no evidence for stenosis. There was mild regurgitation. - Left atrium: The atrium was severely dilated. - Right ventricle: The cavity size was normal. Wall thickness was   normal. Systolic function was normal. - Right atrium: The atrium was mildly dilated. - Tricuspid valve: There was no regurgitation. - Inferior vena cava: The vessel was normal in size. The   respirophasic diameter changes were in the normal range (>= 50%),   consistent with normal central venous pressure. - Pericardium, extracardiac: A trivial pericardial effusion was   identified. There was a large left pleural effusion.  LHC/RHC 01/23/16:  Mid RCA lesion, 40 %stenosed.  Prox RCA lesion, 10 %stenosed.  Hemodynamic findings consistent with moderate pulmonary hypertension, mitral valve regurgitation and aortic valve stenosis.   Non-obstructive CAD. Mildly reduced cardiac index. PA 58%, PCWP 28,  Mean PA 45.  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 Donata Clay on Monday.   # Chronic systolic and diastolic heart failure:  Mr. Mcbane is not on a beta blocker or ACE-I/ARB due to hypotension.  Will need consideration of ICD if no LVEF improvement in 3 months.   # Non-obstructive CAD: Continue aspirin.  LDL 80.   John Vasconcelos C. Duke Salvia, MD, [facility] 01/28/2016 9:06 AM

## 2016-01-29 ENCOUNTER — Inpatient Hospital Stay (HOSPITAL_COMMUNITY): Payer: BLUE CROSS/BLUE SHIELD

## 2016-01-29 ENCOUNTER — Encounter (HOSPITAL_COMMUNITY): Payer: Self-pay | Admitting: Anesthesiology

## 2016-01-29 ENCOUNTER — Encounter (HOSPITAL_COMMUNITY)
Admission: AD | Disposition: A | Discharge status: Home / Self Care | Payer: Self-pay | Source: Ambulatory Visit | Attending: Cardiothoracic Surgery

## 2016-01-29 ENCOUNTER — Inpatient Hospital Stay (HOSPITAL_COMMUNITY): Payer: BLUE CROSS/BLUE SHIELD | Admitting: Anesthesiology

## 2016-01-29 DIAGNOSIS — I350 Nonrheumatic aortic (valve) stenosis: Secondary | ICD-10-CM

## 2016-01-29 HISTORY — PX: TEE WITHOUT CARDIOVERSION: SHX5443

## 2016-01-29 HISTORY — PX: AORTIC VALVE REPLACEMENT: SHX41

## 2016-01-29 LAB — POCT I-STAT 3, ART BLOOD GAS (G3+)
ACID-BASE EXCESS: 2 mmol/L (ref 0.0–2.0)
Acid-Base Excess: 5 mmol/L — ABNORMAL HIGH (ref 0.0–2.0)
Acid-base deficit: 1 mmol/L (ref 0.0–2.0)
Acid-base deficit: 2 mmol/L (ref 0.0–2.0)
BICARBONATE: 28 meq/L — AB (ref 20.0–24.0)
Bicarbonate: 26.7 mEq/L — ABNORMAL HIGH (ref 20.0–24.0)
Bicarbonate: 30 mEq/L — ABNORMAL HIGH (ref 20.0–24.0)
Bicarbonate: 26.2 mEq/L — ABNORMAL HIGH (ref 20.0–24.0)
Bicarbonate: 24 mEq/L (ref 20.0–24.0)
Bicarbonate: 25 mEq/L — ABNORMAL HIGH (ref 20.0–24.0)
O2 SAT: 100 %
O2 SAT: 96 %
O2 SAT: 95 %
O2 Saturation: 100 %
O2 Saturation: 100 %
O2 Saturation: 96 %
PCO2 ART: 43.9 mmHg (ref 35.0–45.0)
PCO2 ART: 49.6 mmHg — AB (ref 35.0–45.0)
PCO2 ART: 53.9 mmHg — AB (ref 35.0–45.0)
PCO2 ART: 42.1 mmHg (ref 35.0–45.0)
PH ART: 7.314 — AB (ref 7.350–7.450)
PH ART: 7.443 (ref 7.350–7.450)
PH ART: 7.291 — AB (ref 7.350–7.450)
PH ART: 7.33 — AB (ref 7.350–7.450)
PH ART: 7.38 (ref 7.350–7.450)
PO2 ART: 466 mmHg — AB (ref 80.0–100.0)
PO2 ART: 393 mmHg — AB (ref 80.0–100.0)
PO2 ART: 87 mmHg (ref 80.0–100.0)
Patient temperature: 36.1
TCO2: 28 mmol/L (ref 0–100)
TCO2: 31 mmol/L (ref 0–100)
TCO2: 29 mmol/L (ref 0–100)
TCO2: 28 mmol/L (ref 0–100)
TCO2: 25 mmol/L (ref 0–100)
TCO2: 26 mmol/L (ref 0–100)
pCO2 arterial: 52.6 mmHg — ABNORMAL HIGH (ref 35.0–45.0)
pCO2 arterial: 45.5 mmHg — ABNORMAL HIGH (ref 35.0–45.0)
pH, Arterial: 7.359 (ref 7.350–7.450)
pO2, Arterial: 269 mmHg — ABNORMAL HIGH (ref 80.0–100.0)
pO2, Arterial: 91 mmHg (ref 80.0–100.0)
pO2, Arterial: 76 mmHg — ABNORMAL LOW (ref 80.0–100.0)

## 2016-01-29 LAB — GLUCOSE, CAPILLARY
GLUCOSE-CAPILLARY: 118 mg/dL — AB (ref 65–99)
GLUCOSE-CAPILLARY: 120 mg/dL — AB (ref 65–99)
GLUCOSE-CAPILLARY: 134 mg/dL — AB (ref 65–99)
Glucose-Capillary: 129 mg/dL — ABNORMAL HIGH (ref 65–99)
Glucose-Capillary: 50 mg/dL — ABNORMAL LOW (ref 65–99)

## 2016-01-29 LAB — CBC
HCT: 46.3 % (ref 39.0–52.0)
HCT: 39.4 % (ref 39.0–52.0)
HEMATOCRIT: 38.9 % — AB (ref 39.0–52.0)
HEMOGLOBIN: 12.8 g/dL — AB (ref 13.0–17.0)
Hemoglobin: 15.3 g/dL (ref 13.0–17.0)
Hemoglobin: 13 g/dL (ref 13.0–17.0)
MCH: 32.6 pg (ref 26.0–34.0)
MCH: 32.2 pg (ref 26.0–34.0)
MCH: 31.6 pg (ref 26.0–34.0)
MCHC: 33 g/dL (ref 30.0–36.0)
MCHC: 32.9 g/dL (ref 30.0–36.0)
MCHC: 33 g/dL (ref 30.0–36.0)
MCV: 98.5 fL (ref 78.0–100.0)
MCV: 97.7 fL (ref 78.0–100.0)
MCV: 95.6 fL (ref 78.0–100.0)
PLATELETS: 126 10*3/uL — AB (ref 150–400)
Platelets: 86 10*3/uL — ABNORMAL LOW (ref 150–400)
Platelets: 106 10*3/uL — ABNORMAL LOW (ref 150–400)
RBC: 4.7 MIL/uL (ref 4.22–5.81)
RBC: 3.98 MIL/uL — AB (ref 4.22–5.81)
RBC: 4.12 MIL/uL — ABNORMAL LOW (ref 4.22–5.81)
RDW: 13.2 % (ref 11.5–15.5)
RDW: 13.1 % (ref 11.5–15.5)
RDW: 12.7 % (ref 11.5–15.5)
WBC: 5.3 10*3/uL (ref 4.0–10.5)
WBC: 6.6 10*3/uL (ref 4.0–10.5)
WBC: 9 10*3/uL (ref 4.0–10.5)

## 2016-01-29 LAB — POCT I-STAT, CHEM 8
BUN: 20 mg/dL (ref 6–20)
BUN: 16 mg/dL (ref 6–20)
BUN: 19 mg/dL (ref 6–20)
BUN: 16 mg/dL (ref 6–20)
BUN: 16 mg/dL (ref 6–20)
BUN: 12 mg/dL (ref 6–20)
CALCIUM ION: 1.27 mmol/L — AB (ref 1.12–1.23)
CALCIUM ION: 1.24 mmol/L — AB (ref 1.12–1.23)
CHLORIDE: 100 mmol/L — AB (ref 101–111)
CHLORIDE: 100 mmol/L — AB (ref 101–111)
CHLORIDE: 100 mmol/L — AB (ref 101–111)
CREATININE: 0.4 mg/dL — AB (ref 0.61–1.24)
Calcium, Ion: 0.95 mmol/L — ABNORMAL LOW (ref 1.12–1.23)
Calcium, Ion: 1.2 mmol/L (ref 1.12–1.23)
Calcium, Ion: 1.19 mmol/L (ref 1.12–1.23)
Calcium, Ion: 1.17 mmol/L (ref 1.12–1.23)
Chloride: 99 mmol/L — ABNORMAL LOW (ref 101–111)
Chloride: 97 mmol/L — ABNORMAL LOW (ref 101–111)
Chloride: 98 mmol/L — ABNORMAL LOW (ref 101–111)
Creatinine, Ser: 0.6 mg/dL — ABNORMAL LOW (ref 0.61–1.24)
Creatinine, Ser: 0.2 mg/dL — ABNORMAL LOW (ref 0.61–1.24)
Creatinine, Ser: 0.4 mg/dL — ABNORMAL LOW (ref 0.61–1.24)
Creatinine, Ser: 0.4 mg/dL — ABNORMAL LOW (ref 0.61–1.24)
Creatinine, Ser: 0.5 mg/dL — ABNORMAL LOW (ref 0.61–1.24)
GLUCOSE: 98 mg/dL (ref 65–99)
Glucose, Bld: 111 mg/dL — ABNORMAL HIGH (ref 65–99)
Glucose, Bld: 94 mg/dL (ref 65–99)
Glucose, Bld: 129 mg/dL — ABNORMAL HIGH (ref 65–99)
Glucose, Bld: 139 mg/dL — ABNORMAL HIGH (ref 65–99)
Glucose, Bld: 147 mg/dL — ABNORMAL HIGH (ref 65–99)
HCT: 44 % (ref 39.0–52.0)
HCT: 43 % (ref 39.0–52.0)
HEMATOCRIT: 34 % — AB (ref 39.0–52.0)
HEMATOCRIT: 36 % — AB (ref 39.0–52.0)
HEMATOCRIT: 36 % — AB (ref 39.0–52.0)
HEMATOCRIT: 39 % (ref 39.0–52.0)
HEMOGLOBIN: 11.6 g/dL — AB (ref 13.0–17.0)
HEMOGLOBIN: 13.3 g/dL (ref 13.0–17.0)
Hemoglobin: 15 g/dL (ref 13.0–17.0)
Hemoglobin: 14.6 g/dL (ref 13.0–17.0)
Hemoglobin: 12.2 g/dL — ABNORMAL LOW (ref 13.0–17.0)
Hemoglobin: 12.2 g/dL — ABNORMAL LOW (ref 13.0–17.0)
POTASSIUM: 5.3 mmol/L — AB (ref 3.5–5.1)
POTASSIUM: 4.2 mmol/L (ref 3.5–5.1)
POTASSIUM: 4.1 mmol/L (ref 3.5–5.1)
Potassium: 4.5 mmol/L (ref 3.5–5.1)
Potassium: 4.6 mmol/L (ref 3.5–5.1)
Potassium: 4.7 mmol/L (ref 3.5–5.1)
SODIUM: 137 mmol/L (ref 135–145)
SODIUM: 139 mmol/L (ref 135–145)
SODIUM: 137 mmol/L (ref 135–145)
Sodium: 137 mmol/L (ref 135–145)
Sodium: 139 mmol/L (ref 135–145)
Sodium: 137 mmol/L (ref 135–145)
TCO2: 30 mmol/L (ref 0–100)
TCO2: 31 mmol/L (ref 0–100)
TCO2: 37 mmol/L (ref 0–100)
TCO2: 29 mmol/L (ref 0–100)
TCO2: 28 mmol/L (ref 0–100)
TCO2: 25 mmol/L (ref 0–100)

## 2016-01-29 LAB — HEMOGLOBIN AND HEMATOCRIT, BLOOD
HCT: 36.5 % — ABNORMAL LOW (ref 39.0–52.0)
Hemoglobin: 12.6 g/dL — ABNORMAL LOW (ref 13.0–17.0)

## 2016-01-29 LAB — POCT I-STAT 4, (NA,K, GLUC, HGB,HCT)
GLUCOSE: 106 mg/dL — AB (ref 65–99)
HCT: 39 % (ref 39.0–52.0)
Hemoglobin: 13.3 g/dL (ref 13.0–17.0)
POTASSIUM: 4 mmol/L (ref 3.5–5.1)
SODIUM: 141 mmol/L (ref 135–145)

## 2016-01-29 LAB — BASIC METABOLIC PANEL
Anion gap: 7 (ref 5–15)
BUN: 22 mg/dL — AB (ref 6–20)
CO2: 26 mmol/L (ref 22–32)
CREATININE: 0.66 mg/dL (ref 0.61–1.24)
Calcium: 9.5 mg/dL (ref 8.9–10.3)
Chloride: 103 mmol/L (ref 101–111)
GFR calc Af Amer: >60 mL/min (ref >60)
Glucose, Bld: 97 mg/dL (ref 65–99)
POTASSIUM: 4.2 mmol/L (ref 3.5–5.1)
SODIUM: 136 mmol/L (ref 135–145)

## 2016-01-29 LAB — CREATININE, SERUM
Creatinine, Ser: 0.58 mg/dL — ABNORMAL LOW (ref 0.61–1.24)
GFR calc Af Amer: >60 mL/min (ref >60)
GFR calc non Af Amer: >60 mL/min (ref >60)

## 2016-01-29 LAB — PROTIME-INR
INR: 1.35
PROTHROMBIN TIME: 16.8 s — AB (ref 11.4–15.2)

## 2016-01-29 LAB — APTT: APTT: 32 s (ref 24–36)

## 2016-01-29 LAB — MAGNESIUM: Magnesium: 2.4 mg/dL (ref 1.7–2.4)

## 2016-01-29 LAB — PLATELET COUNT: Platelets: 103 10*3/uL — ABNORMAL LOW (ref 150–400)

## 2016-01-29 SURGERY — REPLACEMENT, AORTIC VALVE, OPEN
Anesthesia: General | Site: Chest

## 2016-01-29 MED ORDER — VANCOMYCIN HCL IN DEXTROSE 1-5 GM/200ML-% IV SOLN
1000.0000 mg | Freq: Once | INTRAVENOUS | Status: AC
  Administered 2016-01-29: 1000 mg via INTRAVENOUS
  Filled 2016-01-29: qty 200

## 2016-01-29 MED ORDER — ACETAMINOPHEN 500 MG PO TABS
1000.0000 mg | ORAL_TABLET | Freq: Four times a day (QID) | ORAL | Status: AC
  Administered 2016-01-29 – 2016-02-03 (×13): 1000 mg via ORAL
  Filled 2016-01-29 (×15): qty 2

## 2016-01-29 MED ORDER — LACTATED RINGERS IV SOLN: 500.0000 mL | Freq: Once | INTRAVENOUS | Status: DC | PRN

## 2016-01-29 MED ORDER — PHENYLEPHRINE HCL 10 MG/ML IJ SOLN
0.0000 ug/min | INTRAVENOUS | Status: DC
  Administered 2016-01-29: 60 ug/min via INTRAVENOUS
  Administered 2016-01-30: 10 ug/min via INTRAVENOUS
  Administered 2016-01-30: 70 ug/min via INTRAVENOUS
  Filled 2016-01-29 (×5): qty 2

## 2016-01-29 MED ORDER — HEMOSTATIC AGENTS (NO CHARGE) OPTIME
TOPICAL | Status: DC | PRN
  Administered 2016-01-29: 1 via TOPICAL

## 2016-01-29 MED ORDER — HEPARIN SODIUM (PORCINE) 1000 UNIT/ML IJ SOLN
INTRAMUSCULAR | Status: DC | PRN
  Administered 2016-01-29: 23000 [IU] via INTRAVENOUS

## 2016-01-29 MED ORDER — ACETAMINOPHEN 650 MG RE SUPP
650.0000 mg | Freq: Once | RECTAL | Status: AC
  Administered 2016-01-29: 650 mg via RECTAL

## 2016-01-29 MED ORDER — ANTISEPTIC ORAL RINSE SOLUTION (CORINZ)
7.0000 mL | OROMUCOSAL | Status: DC
  Administered 2016-01-29: 7 mL via OROMUCOSAL

## 2016-01-29 MED ORDER — MIDAZOLAM HCL 10 MG/2ML IJ SOLN
INTRAMUSCULAR | Status: AC
  Filled 2016-01-29: qty 2

## 2016-01-29 MED ORDER — DOCUSATE SODIUM 100 MG PO CAPS
200.0000 mg | ORAL_CAPSULE | Freq: Every day | ORAL | Status: DC
  Administered 2016-01-30 – 2016-02-05 (×7): 200 mg via ORAL
  Filled 2016-01-29 (×7): qty 2

## 2016-01-29 MED ORDER — DEXTROSE 5 % IV SOLN
INTRAVENOUS | Status: DC | PRN
  Administered 2016-01-29: 09:00:00 via INTRAVENOUS

## 2016-01-29 MED ORDER — SODIUM CHLORIDE 0.45 % IV SOLN
INTRAVENOUS | Status: DC | PRN
  Administered 2016-01-29: 14:00:00 via INTRAVENOUS

## 2016-01-29 MED ORDER — INSULIN ASPART 100 UNIT/ML ~~LOC~~ SOLN
0.0000 [IU] | SUBCUTANEOUS | Status: DC
  Administered 2016-01-29 – 2016-01-31 (×4): 2 [IU] via SUBCUTANEOUS
  Administered 2016-01-31: 3 [IU] via SUBCUTANEOUS
  Administered 2016-01-31: 2 [IU] via SUBCUTANEOUS

## 2016-01-29 MED ORDER — PROTAMINE SULFATE 10 MG/ML IV SOLN
INTRAVENOUS | Status: AC
  Filled 2016-01-29: qty 25

## 2016-01-29 MED ORDER — ONDANSETRON HCL 4 MG/2ML IJ SOLN: 4.0000 mg | Freq: Four times a day (QID) | INTRAMUSCULAR | Status: DC | PRN

## 2016-01-29 MED ORDER — SODIUM CHLORIDE 0.9% FLUSH: 3.0000 mL | INTRAVENOUS | Status: DC | PRN

## 2016-01-29 MED ORDER — POTASSIUM CHLORIDE 10 MEQ/50ML IV SOLN: 10.0000 meq | INTRAVENOUS | Status: AC

## 2016-01-29 MED ORDER — ROCURONIUM BROMIDE 50 MG/5ML IV SOLN
INTRAVENOUS | Status: AC
  Filled 2016-01-29: qty 1

## 2016-01-29 MED ORDER — DEXTROSE 5 % IV SOLN
INTRAVENOUS | Status: DC | PRN
  Administered 2016-01-29: 20 ug/min via INTRAVENOUS

## 2016-01-29 MED ORDER — SODIUM CHLORIDE 0.9% FLUSH: 10.0000 mL | INTRAVENOUS | Status: DC | PRN

## 2016-01-29 MED ORDER — SODIUM CHLORIDE 0.9 % IJ SOLN
OROMUCOSAL | Status: DC | PRN
  Administered 2016-01-29: 08:00:00 via TOPICAL

## 2016-01-29 MED ORDER — AMIODARONE HCL IN DEXTROSE 360-4.14 MG/200ML-% IV SOLN: 60.0000 mg/h | INTRAVENOUS | Status: DC

## 2016-01-29 MED ORDER — PROPOFOL 10 MG/ML IV BOLUS
INTRAVENOUS | Status: DC | PRN
  Administered 2016-01-29: 80 mg via INTRAVENOUS

## 2016-01-29 MED ORDER — DEXTROSE 50 % IV SOLN
INTRAVENOUS | Status: AC
  Administered 2016-01-29: 20 mL
  Filled 2016-01-29: qty 50

## 2016-01-29 MED ORDER — VECURONIUM BROMIDE 10 MG IV SOLR
INTRAVENOUS | Status: DC | PRN
  Administered 2016-01-29 (×6): 5 mg via INTRAVENOUS
  Administered 2016-01-29: 10 mg via INTRAVENOUS

## 2016-01-29 MED ORDER — MIDAZOLAM HCL 2 MG/2ML IJ SOLN: 2.0000 mg | INTRAMUSCULAR | Status: DC | PRN

## 2016-01-29 MED ORDER — FENTANYL CITRATE (PF) 100 MCG/2ML IJ SOLN
INTRAMUSCULAR | Status: DC | PRN
  Administered 2016-01-29 (×3): 250 ug via INTRAVENOUS
  Administered 2016-01-29: 500 ug via INTRAVENOUS
  Administered 2016-01-29: 250 ug via INTRAVENOUS

## 2016-01-29 MED ORDER — DEXMEDETOMIDINE HCL IN NACL 200 MCG/50ML IV SOLN
0.0000 ug/kg/h | INTRAVENOUS | Status: DC
  Filled 2016-01-29: qty 50

## 2016-01-29 MED ORDER — ARTIFICIAL TEARS OP OINT
TOPICAL_OINTMENT | OPHTHALMIC | Status: AC
  Filled 2016-01-29: qty 3.5

## 2016-01-29 MED ORDER — ALBUMIN HUMAN 5 % IV SOLN
INTRAVENOUS | Status: DC | PRN
  Administered 2016-01-29 (×2): via INTRAVENOUS

## 2016-01-29 MED ORDER — SODIUM CHLORIDE 0.9 % IV SOLN: INTRAVENOUS | Status: DC

## 2016-01-29 MED ORDER — NITROGLYCERIN IN D5W 200-5 MCG/ML-% IV SOLN: 0.0000 ug/min | INTRAVENOUS | Status: DC

## 2016-01-29 MED ORDER — ALBUMIN HUMAN 5 % IV SOLN
250.0000 mL | INTRAVENOUS | Status: AC | PRN
Start: 2016-01-29 — End: 2016-01-30
  Administered 2016-01-29 (×3): 250 mL via INTRAVENOUS
  Filled 2016-01-29: qty 250

## 2016-01-29 MED ORDER — GLYCOPYRROLATE 0.2 MG/ML IJ SOLN
INTRAMUSCULAR | Status: DC | PRN
  Administered 2016-01-29 (×2): 0.2 mg via INTRAVENOUS

## 2016-01-29 MED ORDER — OXYCODONE HCL 5 MG PO TABS
5.0000 mg | ORAL_TABLET | ORAL | Status: DC | PRN
  Administered 2016-01-30: 5 mg via ORAL
  Administered 2016-01-31: 10 mg via ORAL
  Filled 2016-01-29: qty 2
  Filled 2016-01-29: qty 1

## 2016-01-29 MED ORDER — ASPIRIN 81 MG PO CHEW
324.0000 mg | CHEWABLE_TABLET | Freq: Every day | ORAL | Status: DC
  Filled 2016-01-29: qty 4

## 2016-01-29 MED ORDER — INSULIN REGULAR BOLUS VIA INFUSION
0.0000 [IU] | Freq: Three times a day (TID) | INTRAVENOUS | Status: DC
  Filled 2016-01-29: qty 10

## 2016-01-29 MED ORDER — ASPIRIN EC 325 MG PO TBEC
325.0000 mg | DELAYED_RELEASE_TABLET | Freq: Every day | ORAL | Status: DC
  Administered 2016-01-30 – 2016-02-03 (×5): 325 mg via ORAL
  Filled 2016-01-29 (×5): qty 1

## 2016-01-29 MED ORDER — VECURONIUM BROMIDE 10 MG IV SOLR: INTRAVENOUS | Status: DC | PRN

## 2016-01-29 MED ORDER — MILRINONE LACTATE IN DEXTROSE 20-5 MG/100ML-% IV SOLN
0.3750 ug/kg/min | INTRAVENOUS | Status: AC
  Administered 2016-01-29: .3 ug/kg/min via INTRAVENOUS
  Filled 2016-01-29: qty 100

## 2016-01-29 MED ORDER — AMIODARONE HCL IN DEXTROSE 360-4.14 MG/200ML-% IV SOLN
60.0000 mg/h | INTRAVENOUS | Status: DC
  Administered 2016-01-29: 60 mg/h via INTRAVENOUS
  Filled 2016-01-29: qty 200

## 2016-01-29 MED ORDER — MIDAZOLAM HCL 5 MG/5ML IJ SOLN
INTRAMUSCULAR | Status: DC | PRN
  Administered 2016-01-29: 2 mg via INTRAVENOUS
  Administered 2016-01-29 (×2): 3 mg via INTRAVENOUS
  Administered 2016-01-29: 4 mg via INTRAVENOUS
  Administered 2016-01-29: 2 mg via INTRAVENOUS

## 2016-01-29 MED ORDER — DEXTROSE 5 % IV SOLN
1.5000 g | Freq: Two times a day (BID) | INTRAVENOUS | Status: AC
  Administered 2016-01-29 – 2016-01-31 (×4): 1.5 g via INTRAVENOUS
  Filled 2016-01-29 (×4): qty 1.5

## 2016-01-29 MED ORDER — SODIUM CHLORIDE 0.9 % IV SOLN
INTRAVENOUS | Status: DC
  Filled 2016-01-29: qty 2.5

## 2016-01-29 MED ORDER — AMIODARONE IV BOLUS ONLY 150 MG/100ML
INTRAVENOUS | Status: DC | PRN
  Administered 2016-01-29: 150 mg via INTRAVENOUS

## 2016-01-29 MED ORDER — HEPARIN SODIUM (PORCINE) 1000 UNIT/ML IJ SOLN
INTRAMUSCULAR | Status: AC
  Filled 2016-01-29: qty 1

## 2016-01-29 MED ORDER — BISACODYL 5 MG PO TBEC
10.0000 mg | DELAYED_RELEASE_TABLET | Freq: Every day | ORAL | Status: DC
  Administered 2016-01-30 – 2016-02-05 (×6): 10 mg via ORAL
  Filled 2016-01-29 (×6): qty 2

## 2016-01-29 MED ORDER — TRAMADOL HCL 50 MG PO TABS
50.0000 mg | ORAL_TABLET | ORAL | Status: DC | PRN
  Administered 2016-01-31 (×2): 50 mg via ORAL
  Administered 2016-02-01: 100 mg via ORAL
  Filled 2016-01-29: qty 2
  Filled 2016-01-29 (×2): qty 1

## 2016-01-29 MED ORDER — FAMOTIDINE IN NACL 20-0.9 MG/50ML-% IV SOLN
20.0000 mg | Freq: Two times a day (BID) | INTRAVENOUS | Status: AC
  Administered 2016-01-29: 20 mg via INTRAVENOUS

## 2016-01-29 MED ORDER — LACTATED RINGERS IV SOLN
INTRAVENOUS | Status: DC | PRN
  Administered 2016-01-29 (×2): via INTRAVENOUS

## 2016-01-29 MED ORDER — MIDAZOLAM HCL 2 MG/2ML IJ SOLN
INTRAMUSCULAR | Status: AC
  Filled 2016-01-29: qty 4

## 2016-01-29 MED ORDER — PROTAMINE SULFATE 10 MG/ML IV SOLN
INTRAVENOUS | Status: DC | PRN
  Administered 2016-01-29: 230 mg via INTRAVENOUS

## 2016-01-29 MED ORDER — VECURONIUM BROMIDE 10 MG IV SOLR
INTRAVENOUS | Status: AC
  Filled 2016-01-29: qty 10

## 2016-01-29 MED ORDER — CHLORHEXIDINE GLUCONATE 0.12 % MT SOLN
15.0000 mL | OROMUCOSAL | Status: AC
  Administered 2016-01-29: 15 mL via OROMUCOSAL

## 2016-01-29 MED ORDER — SODIUM CHLORIDE 0.9 % IV SOLN: 250.0000 mL | INTRAVENOUS | Status: DC

## 2016-01-29 MED ORDER — FENTANYL CITRATE (PF) 250 MCG/5ML IJ SOLN
INTRAMUSCULAR | Status: AC
  Filled 2016-01-29: qty 30

## 2016-01-29 MED ORDER — MORPHINE SULFATE (PF) 2 MG/ML IV SOLN
2.0000 mg | INTRAVENOUS | Status: DC | PRN
  Administered 2016-01-30 (×6): 2 mg via INTRAVENOUS
  Filled 2016-01-29 (×5): qty 1

## 2016-01-29 MED ORDER — AMIODARONE HCL IN DEXTROSE 360-4.14 MG/200ML-% IV SOLN
30.0000 mg/h | INTRAVENOUS | Status: DC
  Filled 2016-01-29: qty 200

## 2016-01-29 MED ORDER — ROCURONIUM BROMIDE 100 MG/10ML IV SOLN
INTRAVENOUS | Status: DC | PRN
  Administered 2016-01-29: 50 mg via INTRAVENOUS

## 2016-01-29 MED ORDER — SODIUM CHLORIDE 0.9 % IV SOLN
INTRAVENOUS | Status: DC | PRN
  Administered 2016-01-29: 09:00:00 via INTRAVENOUS

## 2016-01-29 MED ORDER — BISACODYL 10 MG RE SUPP
10.0000 mg | Freq: Every day | RECTAL | Status: DC
  Administered 2016-02-02: 10 mg via RECTAL
  Filled 2016-01-29: qty 1

## 2016-01-29 MED ORDER — METOPROLOL TARTRATE 5 MG/5ML IV SOLN: 2.5000 mg | INTRAVENOUS | Status: DC | PRN

## 2016-01-29 MED ORDER — GLYCOPYRROLATE 0.2 MG/ML IV SOSY
PREFILLED_SYRINGE | INTRAVENOUS | Status: AC
  Filled 2016-01-29: qty 9

## 2016-01-29 MED ORDER — LEVALBUTEROL HCL 1.25 MG/0.5ML IN NEBU: 1.2500 mg | INHALATION_SOLUTION | Freq: Four times a day (QID) | RESPIRATORY_TRACT | Status: DC

## 2016-01-29 MED ORDER — LACTATED RINGERS IV SOLN: INTRAVENOUS | Status: DC

## 2016-01-29 MED ORDER — SODIUM CHLORIDE 0.9% FLUSH
10.0000 mL | Freq: Two times a day (BID) | INTRAVENOUS | Status: DC
  Administered 2016-01-29 – 2016-02-01 (×3): 10 mL

## 2016-01-29 MED ORDER — CETYLPYRIDINIUM CHLORIDE 0.05 % MT LIQD
7.0000 mL | Freq: Two times a day (BID) | OROMUCOSAL | Status: DC
  Administered 2016-01-29 – 2016-02-05 (×8): 7 mL via OROMUCOSAL

## 2016-01-29 MED ORDER — SODIUM CHLORIDE 0.9% FLUSH
3.0000 mL | Freq: Two times a day (BID) | INTRAVENOUS | Status: DC
  Administered 2016-01-30 – 2016-02-05 (×10): 3 mL via INTRAVENOUS

## 2016-01-29 MED ORDER — DOPAMINE-DEXTROSE 3.2-5 MG/ML-% IV SOLN: 0.0000 ug/kg/min | INTRAVENOUS | Status: DC

## 2016-01-29 MED ORDER — MILRINONE LACTATE IN DEXTROSE 20-5 MG/100ML-% IV SOLN
0.1250 ug/kg/min | INTRAVENOUS | Status: DC
  Administered 2016-01-29: 0.3 ug/kg/min via INTRAVENOUS
  Administered 2016-01-30 – 2016-01-31 (×2): 0.125 ug/kg/min via INTRAVENOUS
  Filled 2016-01-29 (×3): qty 100

## 2016-01-29 MED ORDER — LEVALBUTEROL HCL 1.25 MG/0.5ML IN NEBU
1.2500 mg | INHALATION_SOLUTION | Freq: Four times a day (QID) | RESPIRATORY_TRACT | Status: DC
  Administered 2016-01-29: 1.25 mg via RESPIRATORY_TRACT
  Filled 2016-01-29 (×3): qty 0.5

## 2016-01-29 MED ORDER — CHLORHEXIDINE GLUCONATE 0.12% ORAL RINSE (MEDLINE KIT)
15.0000 mL | Freq: Two times a day (BID) | OROMUCOSAL | Status: DC
  Administered 2016-01-29: 15 mL via OROMUCOSAL

## 2016-01-29 MED ORDER — AMIODARONE HCL IN DEXTROSE 360-4.14 MG/200ML-% IV SOLN
30.0000 mg/h | INTRAVENOUS | Status: DC
Start: 2016-01-29 — End: 2016-01-30
  Administered 2016-01-30: 30 mg/h via INTRAVENOUS
  Filled 2016-01-29: qty 200

## 2016-01-29 MED ORDER — LACTATED RINGERS IV SOLN
INTRAVENOUS | Status: DC
  Administered 2016-01-29: 14:00:00 via INTRAVENOUS

## 2016-01-29 MED ORDER — PROPOFOL 10 MG/ML IV BOLUS
INTRAVENOUS | Status: AC
  Filled 2016-01-29: qty 40

## 2016-01-29 MED ORDER — PANTOPRAZOLE SODIUM 40 MG PO TBEC
40.0000 mg | DELAYED_RELEASE_TABLET | Freq: Every day | ORAL | Status: DC
  Administered 2016-01-31 – 2016-02-05 (×6): 40 mg via ORAL
  Filled 2016-01-29 (×6): qty 1

## 2016-01-29 MED ORDER — ARTIFICIAL TEARS OP OINT
TOPICAL_OINTMENT | OPHTHALMIC | Status: DC | PRN
  Administered 2016-01-29: 1 via OPHTHALMIC

## 2016-01-29 MED ORDER — MORPHINE SULFATE (PF) 2 MG/ML IV SOLN
1.0000 mg | INTRAVENOUS | Status: AC | PRN
  Filled 2016-01-29: qty 1

## 2016-01-29 MED ORDER — METOPROLOL TARTRATE 25 MG/10 ML ORAL SUSPENSION: 12.5000 mg | Freq: Two times a day (BID) | ORAL | Status: DC

## 2016-01-29 MED ORDER — EPHEDRINE 5 MG/ML INJ
INTRAVENOUS | Status: AC
  Filled 2016-01-29: qty 10

## 2016-01-29 MED ORDER — METOPROLOL TARTRATE 12.5 MG HALF TABLET
12.5000 mg | ORAL_TABLET | Freq: Two times a day (BID) | ORAL | Status: DC
  Administered 2016-01-31: 12.5 mg via ORAL
  Filled 2016-01-29: qty 1

## 2016-01-29 MED ORDER — MAGNESIUM SULFATE 4 GM/100ML IV SOLN
4.0000 g | Freq: Once | INTRAVENOUS | Status: AC
  Administered 2016-01-29: 4 g via INTRAVENOUS
  Filled 2016-01-29: qty 100

## 2016-01-29 MED ORDER — PHENYLEPHRINE 40 MCG/ML (10ML) SYRINGE FOR IV PUSH (FOR BLOOD PRESSURE SUPPORT)
PREFILLED_SYRINGE | INTRAVENOUS | Status: AC
  Filled 2016-01-29: qty 10

## 2016-01-29 MED ORDER — ACETAMINOPHEN 160 MG/5ML PO SOLN: 1000.0000 mg | Freq: Four times a day (QID) | ORAL | Status: AC

## 2016-01-29 MED ORDER — ACETAMINOPHEN 160 MG/5ML PO SOLN: 650.0000 mg | Freq: Once | ORAL | Status: AC

## 2016-01-29 MED ORDER — 0.9 % SODIUM CHLORIDE (POUR BTL) OPTIME
TOPICAL | Status: DC | PRN
  Administered 2016-01-29: 6000 mL

## 2016-01-29 MED ORDER — ATROPINE SULFATE 0.4 MG/ML IV SOSY
PREFILLED_SYRINGE | INTRAVENOUS | Status: AC
  Filled 2016-01-29: qty 2.5

## 2016-01-29 MED FILL — Magnesium Sulfate Inj 50%: INTRAMUSCULAR | Qty: 10 | Status: AC

## 2016-01-29 MED FILL — Heparin Sodium (Porcine) Inj 1000 Unit/ML: INTRAMUSCULAR | Qty: 30 | Status: AC

## 2016-01-29 MED FILL — Potassium Chloride Inj 2 mEq/ML: INTRAVENOUS | Qty: 40 | Status: AC

## 2016-01-29 SURGICAL SUPPLY — 77 items
ADAPTER CARDIO PERF ANTE/RETRO (ADAPTER) ×4 IMPLANT
BAG DECANTER FOR FLEXI CONT (MISCELLANEOUS) ×4 IMPLANT
BLADE STERNUM SYSTEM 6 (BLADE) ×4 IMPLANT
BLADE SURG 12 STRL SS (BLADE) ×4 IMPLANT
BLADE SURG 15 STRL LF DISP TIS (BLADE) ×2 IMPLANT
BLADE SURG 15 STRL SS (BLADE) ×2
CANISTER SUCTION 2500CC (MISCELLANEOUS) ×4 IMPLANT
CANNULA GUNDRY RCSP 15FR (MISCELLANEOUS) ×4 IMPLANT
CATH CPB KIT VANTRIGT (MISCELLANEOUS) ×4 IMPLANT
CATH HEART VENT LEFT (CATHETERS) ×2 IMPLANT
CATH RETROPLEGIA CORONARY 14FR (CATHETERS) IMPLANT
CATH ROBINSON RED A/P 18FR (CATHETERS) ×12 IMPLANT
CATH THORACIC 36FR RT ANG (CATHETERS) ×4 IMPLANT
CLIP FOGARTY SPRING 6M (CLIP) IMPLANT
CONT SPEC 4OZ CLIKSEAL STRL BL (MISCELLANEOUS) ×4 IMPLANT
COVER SURGICAL LIGHT HANDLE (MISCELLANEOUS) ×8 IMPLANT
CRADLE DONUT ADULT HEAD (MISCELLANEOUS) ×4 IMPLANT
DRAIN CHANNEL 32F RND 10.7 FF (WOUND CARE) ×4 IMPLANT
DRAPE CARDIOVASCULAR INCISE (DRAPES) ×2
DRAPE SLUSH/WARMER DISC (DRAPES) ×4 IMPLANT
DRAPE SRG 135X102X78XABS (DRAPES) ×2 IMPLANT
DRSG AQUACEL AG ADV 3.5X14 (GAUZE/BANDAGES/DRESSINGS) ×4 IMPLANT
ELECT BLADE 4.0 EZ CLEAN MEGAD (MISCELLANEOUS) ×4
ELECT BLADE 6.5 EXT (BLADE) ×4 IMPLANT
ELECT CAUTERY BLADE 6.4 (BLADE) ×4 IMPLANT
ELECT REM PT RETURN 9FT ADLT (ELECTROSURGICAL) ×8
ELECTRODE BLDE 4.0 EZ CLN MEGD (MISCELLANEOUS) ×2 IMPLANT
ELECTRODE REM PT RTRN 9FT ADLT (ELECTROSURGICAL) ×4 IMPLANT
FELT TEFLON 1X6 (MISCELLANEOUS) ×4 IMPLANT
GAUZE SPONGE 4X4 12PLY STRL (GAUZE/BANDAGES/DRESSINGS) ×4 IMPLANT
GLOVE BIO SURGEON STRL SZ7.5 (GLOVE) ×8 IMPLANT
GLOVE BIOGEL M 6.5 STRL (GLOVE) ×12 IMPLANT
GLOVE BIOGEL M 7.0 STRL (GLOVE) ×12 IMPLANT
GLOVE BIOGEL PI IND STRL 6 (GLOVE) ×6 IMPLANT
GLOVE BIOGEL PI INDICATOR 6 (GLOVE) ×6
GOWN STRL REUS W/ TWL LRG LVL3 (GOWN DISPOSABLE) ×12 IMPLANT
GOWN STRL REUS W/TWL LRG LVL3 (GOWN DISPOSABLE) ×12
HEMOSTAT POWDER SURGIFOAM 1G (HEMOSTASIS) ×12 IMPLANT
HEMOSTAT SURGICEL 2X14 (HEMOSTASIS) ×4 IMPLANT
INSERT FOGARTY XLG (MISCELLANEOUS) IMPLANT
KIT BASIN OR (CUSTOM PROCEDURE TRAY) ×4 IMPLANT
KIT ROOM TURNOVER OR (KITS) ×4 IMPLANT
KIT SUCTION CATH 14FR (SUCTIONS) ×4 IMPLANT
LEAD PACING MYOCARDI (MISCELLANEOUS) ×4 IMPLANT
LINE VENT (MISCELLANEOUS) ×8 IMPLANT
NS IRRIG 1000ML POUR BTL (IV SOLUTION) ×28 IMPLANT
PACK OPEN HEART (CUSTOM PROCEDURE TRAY) ×4 IMPLANT
PAD ARMBOARD 7.5X6 YLW CONV (MISCELLANEOUS) ×8 IMPLANT
SET CARDIOPLEGIA MPS 5001102 (MISCELLANEOUS) ×4 IMPLANT
SURGIFLO W/THROMBIN 8M KIT (HEMOSTASIS) ×4 IMPLANT
SUT BONE WAX W31G (SUTURE) ×4 IMPLANT
SUT ETHIBON 2 0 V 52N 30 (SUTURE) ×8 IMPLANT
SUT ETHIBOND 2 0 SH (SUTURE) ×2
SUT ETHIBOND 2 0 SH 36X2 (SUTURE) ×2 IMPLANT
SUT PROLENE 3 0 RB 1 (SUTURE) ×4 IMPLANT
SUT PROLENE 3 0 SH 1 (SUTURE) IMPLANT
SUT PROLENE 3 0 SH DA (SUTURE) IMPLANT
SUT PROLENE 4 0 RB 1 (SUTURE) ×18
SUT PROLENE 4 0 SH DA (SUTURE) ×12 IMPLANT
SUT PROLENE 4-0 RB1 .5 CRCL 36 (SUTURE) ×18 IMPLANT
SUT STEEL 6MS V (SUTURE) ×8 IMPLANT
SUT STEEL SZ 6 DBL 3X14 BALL (SUTURE) ×4 IMPLANT
SUT VIC AB 1 CTX 36 (SUTURE) ×4
SUT VIC AB 1 CTX36XBRD ANBCTR (SUTURE) ×4 IMPLANT
SUT VIC AB 2-0 CTX 27 (SUTURE) IMPLANT
SUT VIC AB 3-0 X1 27 (SUTURE) IMPLANT
SYR 10ML KIT SKIN ADHESIVE (MISCELLANEOUS) IMPLANT
SYSTEM SAHARA CHEST DRAIN ATS (WOUND CARE) ×4 IMPLANT
TAPE CLOTH SURG 4X10 WHT LF (GAUZE/BANDAGES/DRESSINGS) ×4 IMPLANT
TAPE PAPER 2X10 WHT MICROPORE (GAUZE/BANDAGES/DRESSINGS) ×4 IMPLANT
TOWEL OR 17X24 6PK STRL BLUE (TOWEL DISPOSABLE) ×8 IMPLANT
TOWEL OR 17X26 10 PK STRL BLUE (TOWEL DISPOSABLE) ×8 IMPLANT
TRAY FOLEY IC TEMP SENS 16FR (CATHETERS) ×4 IMPLANT
UNDERPAD 30X30 INCONTINENT (UNDERPADS AND DIAPERS) ×4 IMPLANT
VALVE MAGNA EASE AORTIC 25MM (Prosthesis & Implant Heart) ×4 IMPLANT
VENT LEFT HEART 12002 (CATHETERS) ×4
WATER STERILE IRR 1000ML POUR (IV SOLUTION) ×8 IMPLANT

## 2016-01-29 NOTE — Transfer of Care (Signed)
Immediate Anesthesia Transfer of Care Note  Patient: Aikeem Bronstein  Procedure(s) Performed: Procedure(s): AORTIC VALVE REPLACEMENT (AVR) with Magna Ease size 41mm (N/A) TRANSESOPHAGEAL ECHOCARDIOGRAM (TEE) (N/A)  Patient Location: SICU  Anesthesia Type:General  Level of Consciousness: sedated and Patient remains intubated per anesthesia plan  Airway & Oxygen Therapy: Patient remains intubated per anesthesia plan and Patient placed on Ventilator (see vital sign flow sheet for setting)  Post-op Assessment: Report given to RN and Post -op Vital signs reviewed and stable  Post vital signs: Reviewed and stable  Last Vitals:  Vitals:   01/28/16 2054 01/29/16 0031  BP: (!) 86/62 108/73  Pulse:  74  Resp:  (!) 23  Temp:  36.7 C    Last Pain:  Vitals:   01/29/16 0031  TempSrc: Oral  PainSc:       Patients Stated Pain Goal: 0 (01/28/16 2000)  Complications: No apparent anesthesia complications

## 2016-01-29 NOTE — Progress Notes (Signed)
TCTS BRIEF SICU PROGRESS NOTE  Day of Surgery  S/P Procedure(s) (LRB): AORTIC VALVE REPLACEMENT (AVR) with Magna Ease size 46mm (N/A) TRANSESOPHAGEAL ECHOCARDIOGRAM (TEE) (N/A)   Extubated uneventfully NSR w/ PVC's Stable hemodynamics on milrinone, neo and dopamine drips Breathing comfortably w/ O2 sats 95% on 3 L/min Chest tube output low UOP excellent  Plan: Continue routine early postop  Purcell Nails, MD 01/29/2016 7:46 PM

## 2016-01-29 NOTE — Brief Op Note (Signed)
01/23/2016 - 01/29/2016  12:07 PM  PATIENT:  [name]  60 y.o. male  PRE-OPERATIVE DIAGNOSIS:  1. SEVERE AS with LVEF 30-35% 2. MODERATE AI  POST-OPERATIVE DIAGNOSIS:  1. SEVERE AS with LVEF 30-35% 2. MODERATE AI  PROCEDURE:  TRANSESOPHAGEAL ECHOCARDIOGRAM (TEE), MEDIAN STERNOTOMY for AORTIC VALVE REPLACEMENT (AVR) (using a Magna Ease Pericardial Tissue Valve, size 25 mm),   SURGEON:  Surgeon(s) and Role:    * [name], MD - Primary  PHYSICIAN ASSISTANT: Doree Fudge PA-C  ANESTHESIA:   general  EBL:  Total I/O In: 2000 [I.V.:2000] Out: 1550 [Urine:1550]  BLOOD ADMINISTERED:Twp FFP and one PLTS  DRAINS: Chest tubes placed in the mediastinal and pleural spaces   SPECIMEN:  Source of Specimen:  Native AV leaflets  DISPOSITION OF SPECIMEN:  PATHOLOGY  COUNTS CORRECT:  YES  DICTATION: .Dragon Dictation  PLAN OF CARE: Admit to inpatient   PATIENT DISPOSITION:  ICU - intubated and hemodynamically stable.   Delay start of Pharmacological VTE agent (>24hrs) due to surgical blood loss or risk of bleeding: yes  BASELINE WEIGHT: 74 kg

## 2016-01-29 NOTE — Anesthesia Preprocedure Evaluation (Signed)
Anesthesia Evaluation  Patient identified by MRN, date of birth, ID band Patient awake    Reviewed: Allergy & Precautions, NPO status , Patient's Chart, lab work & pertinent test results  Airway Mallampati: II   Neck ROM: full    Dental   Pulmonary shortness of breath, COPD, Current Smoker,    breath sounds clear to auscultation       Cardiovascular + CAD and +CHF  + Valvular Problems/Murmurs AS  Rhythm:regular Rate:Normal  EF 30%.  Diffuse HK.  Severe AS.  AVA 0.6 cm2   Neuro/Psych    GI/Hepatic   Endo/Other    Renal/GU      Musculoskeletal   Abdominal   Peds  Hematology   Anesthesia Other Findings   Reproductive/Obstetrics                             Anesthesia Physical Anesthesia Plan  ASA: IV  Anesthesia Plan: General   Post-op Pain Management:    Induction: Intravenous  Airway Management Planned: Oral ETT  Additional Equipment: Arterial line, PA Cath, TEE, Ultrasound Guidance Line Placement and CVP  Intra-op Plan:   Post-operative Plan: Post-operative intubation/ventilation  Informed Consent: I have reviewed the patients History and Physical, chart, labs and discussed the procedure including the risks, benefits and alternatives for the proposed anesthesia with the patient or authorized representative who has indicated his/her understanding and acceptance.     Plan Discussed with: CRNA, Anesthesiologist and Surgeon  Anesthesia Plan Comments:         Anesthesia Quick Evaluation

## 2016-01-29 NOTE — Anesthesia Procedure Notes (Signed)
Procedure Name: Intubation Date/Time: 01/29/2016 8:42 AM Performed by: Wray Kearns A Pre-anesthesia Checklist: Patient identified, Emergency Drugs available, Suction available and Patient being monitored Patient Re-evaluated:Patient Re-evaluated prior to inductionOxygen Delivery Method: Circle System Utilized Preoxygenation: Pre-oxygenation with 100% oxygen Intubation Type: IV induction and Cricoid Pressure applied Ventilation: Mask ventilation without difficulty Laryngoscope Size: Mac and 4 Grade View: Grade II Tube type: Oral Tube size: 8.0 mm Number of attempts: 1 Airway Equipment and Method: Stylet and Oral airway Placement Confirmation: ETT inserted through vocal cords under direct vision,  positive ETCO2 and breath sounds checked- equal and bilateral Secured at: 23 cm Tube secured with: Tape Dental Injury: Teeth and Oropharynx as per pre-operative assessment

## 2016-01-29 NOTE — Progress Notes (Signed)
The patient was examined and preop studies reviewed. There has been no change from the prior exam and the patient is ready for surgery.  plan AVR on T [place] today

## 2016-01-29 NOTE — Progress Notes (Signed)
Pt assessed for SICU wean protocol. Pt is not awake enough at this time.

## 2016-01-29 NOTE — Procedures (Signed)
Extubation Procedure Note  Patient Details:   Name: Kinley Yarnell DOB: 01-24-1956 MRN: [id number]   Airway Documentation:     Evaluation  O2 sats: stable throughout Complications: No apparent complications Patient did tolerate procedure well. Bilateral Breath Sounds: Clear, Diminished   Yes  Pt extubated to a 4lpm [place], tolerating well  Melanee Spry 01/29/2016, 6:10 PM

## 2016-01-29 NOTE — Anesthesia Procedure Notes (Signed)
Procedures

## 2016-01-29 NOTE — Progress Notes (Signed)
  Echocardiogram Echocardiogram Transesophageal has been performed.  Leta Jungling M 01/29/2016, 9:02 AM

## 2016-01-30 ENCOUNTER — Encounter (HOSPITAL_COMMUNITY): Payer: Self-pay | Admitting: Cardiothoracic Surgery

## 2016-01-30 ENCOUNTER — Inpatient Hospital Stay (HOSPITAL_COMMUNITY): Payer: BLUE CROSS/BLUE SHIELD

## 2016-01-30 LAB — CBC
HCT: 38.5 % — ABNORMAL LOW (ref 39.0–52.0)
HEMATOCRIT: 37.7 % — AB (ref 39.0–52.0)
HEMOGLOBIN: 12.5 g/dL — AB (ref 13.0–17.0)
Hemoglobin: 13 g/dL (ref 13.0–17.0)
MCH: 31.3 pg (ref 26.0–34.0)
MCH: 32.3 pg (ref 26.0–34.0)
MCHC: 33.2 g/dL (ref 30.0–36.0)
MCHC: 33.8 g/dL (ref 30.0–36.0)
MCV: 94.5 fL (ref 78.0–100.0)
MCV: 95.5 fL (ref 78.0–100.0)
PLATELETS: 99 10*3/uL — AB (ref 150–400)
Platelets: 106 10*3/uL — ABNORMAL LOW (ref 150–400)
RBC: 3.99 MIL/uL — ABNORMAL LOW (ref 4.22–5.81)
RBC: 4.03 MIL/uL — ABNORMAL LOW (ref 4.22–5.81)
RDW: 12.6 % (ref 11.5–15.5)
RDW: 13 % (ref 11.5–15.5)
WBC: 7.8 10*3/uL (ref 4.0–10.5)
WBC: 7.9 10*3/uL (ref 4.0–10.5)

## 2016-01-30 LAB — GLUCOSE, CAPILLARY
GLUCOSE-CAPILLARY: 18 mg/dL — AB (ref 65–99)
GLUCOSE-CAPILLARY: 108 mg/dL — AB (ref 65–99)
GLUCOSE-CAPILLARY: 100 mg/dL — AB (ref 65–99)
Glucose-Capillary: 113 mg/dL — ABNORMAL HIGH (ref 65–99)
Glucose-Capillary: 114 mg/dL — ABNORMAL HIGH (ref 65–99)
Glucose-Capillary: 117 mg/dL — ABNORMAL HIGH (ref 65–99)
Glucose-Capillary: 139 mg/dL — ABNORMAL HIGH (ref 65–99)

## 2016-01-30 LAB — POCT I-STAT, CHEM 8
BUN: 10 mg/dL (ref 6–20)
CALCIUM ION: 1.18 mmol/L (ref 1.12–1.23)
CHLORIDE: 97 mmol/L — AB (ref 101–111)
CREATININE: 0.5 mg/dL — AB (ref 0.61–1.24)
GLUCOSE: 121 mg/dL — AB (ref 65–99)
HCT: 38 % — ABNORMAL LOW (ref 39.0–52.0)
Hemoglobin: 12.9 g/dL — ABNORMAL LOW (ref 13.0–17.0)
Potassium: 4.4 mmol/L (ref 3.5–5.1)
Sodium: 133 mmol/L — ABNORMAL LOW (ref 135–145)
TCO2: 24 mmol/L (ref 0–100)

## 2016-01-30 LAB — CREATININE, SERUM
CREATININE: 0.55 mg/dL — AB (ref 0.61–1.24)
GFR calc Af Amer: >60 mL/min (ref >60)

## 2016-01-30 LAB — MAGNESIUM
MAGNESIUM: 1.9 mg/dL (ref 1.7–2.4)
Magnesium: 1.8 mg/dL (ref 1.7–2.4)

## 2016-01-30 LAB — BASIC METABOLIC PANEL
Anion gap: 4 — ABNORMAL LOW (ref 5–15)
BUN: 10 mg/dL (ref 6–20)
CHLORIDE: 101 mmol/L (ref 101–111)
CO2: 27 mmol/L (ref 22–32)
CREATININE: 0.55 mg/dL — AB (ref 0.61–1.24)
Calcium: 8.5 mg/dL — ABNORMAL LOW (ref 8.9–10.3)
GFR calc non Af Amer: >60 mL/min (ref >60)
GLUCOSE: 125 mg/dL — AB (ref 65–99)
Potassium: 4 mmol/L (ref 3.5–5.1)
Sodium: 132 mmol/L — ABNORMAL LOW (ref 135–145)

## 2016-01-30 MED ORDER — BOOST / RESOURCE BREEZE PO LIQD
1.0000 | Freq: Three times a day (TID) | ORAL | Status: DC
  Administered 2016-01-30 – 2016-02-04 (×4): 1 via ORAL

## 2016-01-30 MED ORDER — GUAIFENESIN ER 600 MG PO TB12
1200.0000 mg | ORAL_TABLET | Freq: Two times a day (BID) | ORAL | Status: DC
  Administered 2016-01-30 – 2016-02-05 (×12): 1200 mg via ORAL
  Filled 2016-01-30 (×12): qty 2

## 2016-01-30 MED ORDER — LEVALBUTEROL HCL 1.25 MG/0.5ML IN NEBU
1.2500 mg | INHALATION_SOLUTION | Freq: Four times a day (QID) | RESPIRATORY_TRACT | Status: DC | PRN
  Administered 2016-01-30: 1.25 mg via RESPIRATORY_TRACT
  Filled 2016-01-30: qty 0.5

## 2016-01-30 MED ORDER — TAMSULOSIN HCL 0.4 MG PO CAPS
0.4000 mg | ORAL_CAPSULE | Freq: Every day | ORAL | Status: DC
  Administered 2016-01-31 – 2016-02-05 (×6): 0.4 mg via ORAL
  Filled 2016-01-30 (×6): qty 1

## 2016-01-30 MED ORDER — AMIODARONE HCL 200 MG PO TABS
200.0000 mg | ORAL_TABLET | Freq: Two times a day (BID) | ORAL | Status: DC
  Administered 2016-01-30 – 2016-02-02 (×8): 200 mg via ORAL
  Filled 2016-01-30 (×8): qty 1

## 2016-01-30 MED ORDER — INSULIN ASPART 100 UNIT/ML ~~LOC~~ SOLN: 0.0000 [IU] | SUBCUTANEOUS | Status: DC

## 2016-01-30 MED FILL — Lidocaine HCl IV Inj 20 MG/ML: INTRAVENOUS | Qty: 5 | Status: AC

## 2016-01-30 MED FILL — Electrolyte-R (PH 7.4) Solution: INTRAVENOUS | Qty: 3000 | Status: AC

## 2016-01-30 MED FILL — Sodium Chloride IV Soln 0.9%: INTRAVENOUS | Qty: 3000 | Status: AC

## 2016-01-30 MED FILL — Heparin Sodium (Porcine) Inj 1000 Unit/ML: INTRAMUSCULAR | Qty: 20 | Status: AC

## 2016-01-30 MED FILL — Sodium Bicarbonate IV Soln 8.4%: INTRAVENOUS | Qty: 50 | Status: AC

## 2016-01-30 MED FILL — Mannitol IV Soln 20%: INTRAVENOUS | Qty: 500 | Status: AC

## 2016-01-30 NOTE — Anesthesia Postprocedure Evaluation (Signed)
Anesthesia Post Note  Patient: [name]  Procedure(s) Performed: Procedure(s) (LRB): AORTIC VALVE REPLACEMENT (AVR) with Magna Ease size 26mm (N/A) TRANSESOPHAGEAL ECHOCARDIOGRAM (TEE) (N/A)  Patient location during evaluation: ICU Anesthesia Type: General Level of consciousness: sedated and patient remains intubated per anesthesia plan Pain management: pain level controlled Vital Signs Assessment: post-procedure vital signs reviewed and stable Respiratory status: patient on ventilator - see flowsheet for VS and patient remains intubated per anesthesia plan Cardiovascular status: stable Anesthetic complications: no    Last Vitals:  Vitals:   01/30/16 0915 01/30/16 0930  BP:    Pulse: 77 74  Resp: (!) 36 (!) 32  Temp: 37.7 C     Last Pain:  Vitals:   01/30/16 0800  TempSrc:   PainSc: 1                  Damary Doland S

## 2016-01-30 NOTE — Progress Notes (Signed)
Dr. Donata Clay performed NTS at bedside to remove mucus/phlegm that pt was struggling to cough up on his own.  Pt now resting comfortably in bed, educated with return demonstration about deep breathing and coughing and incentive spirometry.  Per MD, mucinex and flutter valve added to orders. Will obtain flutter valve from RRT tonight.  Will continue to monitor.  Peyton Bottoms, RN 7:59 PM 01/30/16

## 2016-01-30 NOTE — Progress Notes (Signed)
1 Day Post-Op Procedure(s) (LRB): AORTIC VALVE REPLACEMENT (AVR) with Magna Ease size 53mm (N/A) TRANSESOPHAGEAL ECHOCARDIOGRAM (TEE) (N/A) Subjective: Patient mobilize out of bed to chair Heavy airway secretions-history of COPD Nasotracheal suctioned at bedside with heavy secretions Will add Mucinex to bronchodilators  Objective: Vital signs in last 24 hours: Temp:  [97.8 F (36.6 C)-99.9 F (37.7 C)] 98.9 F (37.2 C) (08/01 1940) Pulse Rate:  [39-87] 74 (08/01 2000) Cardiac Rhythm: Normal sinus rhythm (08/01 1600) Resp:  [19-38] 26 (08/01 2000) BP: (91-109)/(54-79) 98/76 (08/01 1915) SpO2:  [93 %-99 %] 96 % (08/01 2000) Weight:  [164 lb 10.9 oz (74.7 kg)] 164 lb 10.9 oz (74.7 kg) (08/01 0500)  Hemodynamic parameters for last 24 hours: PAP: (24-38)/(7-20) 29/15 CO:  [4.9 L/min-8.6 L/min] 4.9 L/min CI:  [2.5 L/min/m2-4.4 L/min/m2] 2.5 L/min/m2  Intake/Output from previous day: 07/31 0701 - 08/01 0700 In: 6784.9 [I.V.:5287.9; Blood:347; IV Piggyback:1150] Out: 6445 [Urine:5225; Blood:750; Chest Tube:470] Intake/Output this shift: Total I/O In: 380.3 [I.V.:380.3] Out: 145 [Urine:135; Chest Tube:10]       Exam    General- alert and comfortable   Lungs- clear without rales, wheezes   Cor- regular rate and rhythm, no murmur , gallop   Abdomen- soft, non-tender   Extremities - warm, non-tender, minimal edema   Neuro- oriented, appropriate, no focal weakness   Lab Results:  Recent Labs  01/30/16 0328 01/30/16 1737 01/30/16 1739  WBC 7.8 7.9  --   HGB 12.5* 13.0 12.9*  HCT 37.7* 38.5* 38.0*  PLT 106* 99*  --    BMET:  Recent Labs  01/29/16 0426  01/30/16 0328 01/30/16 1737 01/30/16 1739  NA 136  < > 132*  --  133*  K 4.2  < > 4.0  --  4.4  CL 103  < > 101  --  97*  CO2 26  --  27  --   --   GLUCOSE 97  < > 125*  --  121*  BUN 22*  < > 10  --  10  CREATININE 0.66  < > 0.55* 0.55* 0.50*  CALCIUM 9.5  --  8.5*  --   --   < > = values in this interval not  displayed.  PT/INR:  Recent Labs  01/29/16 1358  LABPROT 16.8*  INR 1.35   ABG    Component Value Date/Time   PHART 7.380 01/29/2016 1859   HCO3 25.0 (H) 01/29/2016 1859   TCO2 24 01/30/2016 1739   ACIDBASEDEF 2.0 01/29/2016 1801   O2SAT 95.0 01/29/2016 1859   CBG (last 3)   Recent Labs  01/30/16 1139 01/30/16 1658 01/30/16 1920  GLUCAP 108* 100* 139*    Assessment/Plan: S/P Procedure(s) (LRB): AORTIC VALVE REPLACEMENT (AVR) with Magna Ease size 67mm (N/A) TRANSESOPHAGEAL ECHOCARDIOGRAM (TEE) (N/A) Continue pulmonary toilet and diuresis   LOS: 7 days    [name] 01/30/2016

## 2016-01-30 NOTE — Progress Notes (Addendum)
Nutrition Follow Up  DOCUMENTATION CODES:   Severe malnutrition in context of chronic illness  INTERVENTION:    Boost Breeze po TID, each supplement provides 250 kcal and 9 grams of protein  NUTRITION DIAGNOSIS:   Malnutrition related to chronic illness as evidenced by severe depletion of body fat, severe depletion of muscle mass, percent weight loss (15%), ongoing  GOAL:   Patient will meet greater than or equal to 90% of their needs, progressing  MONITOR:   PO intake, Supplement acceptance, Weight trends, Labs, I & O's  ASSESSMENT:   60 year old male who presented to the hospital on 7/25 with severe aortic stenosis, class four CHF with severe LV dysfunction and no significant coronary artery disease.   Patient s/p procedures 7/31: MEDIAN STERNOTOMY for AORTIC VALVE REPLACEMENT (AVR)  Transferred from 3W-CPCU to 2S-SICU post-op. Pt identified with malnutrition which is ongoing. Eating well prior to surgery; was receiving Ensure Enlive. Wife was also bringing pt food from home. Will add Boost Breeze as pt on Clear Liquids at this time.  Diet Order:  Diet clear liquid Room service appropriate? Yes; Fluid consistency: Thin  Skin:  Reviewed, no issues  Last BM:  7/25  Height:   Ht Readings from Last 1 Encounters:  01/23/16 5' 9.5" (1.765 m)    Weight:   Wt Readings from Last 1 Encounters:  01/30/16 164 lb 10.9 oz (74.7 kg)    Ideal Body Weight:  74.1 kg  BMI:  Body mass index is 23.97 kg/m.  Estimated Nutritional Needs:   Kcal:  2000-2200  Protein:  100-110 gm  Fluid:  2 L  EDUCATION NEEDS:   No education needs identified at this time  Maureen Chatters, RD, LDN Pager #: [phone] After-Hours Pager #: [phone]

## 2016-01-30 NOTE — Op Note (Signed)
[name], [name] NO.:  [id number]  MEDICAL RECORD NO.:  [id number]  LOCATION:  2S14C                        FACILITY:  [facility]  PHYSICIAN:  [name], M.D.  DATE OF BIRTH:  06/08/1956  DATE OF PROCEDURE:  01/29/2016 DATE OF DISCHARGE:                              OPERATIVE REPORT   OPERATION:  Aortic valve replacement with a 25 mm pericardial tissue valve [facility] Ease, serial [id number]).  PREOPERATIVE DIAGNOSIS:  Severe bicuspid aortic stenosis and insufficiency.  POSTOPERATIVE DIAGNOSIS:  Severe bicuspid aortic stenosis and insufficiency.  ANESTHESIA:  General.  SURGEON:  [name], M.D.  ASSISTANT:  Doree Fudge, PA-C  CLINICAL NOTE:  The patient is a 60 year old, Caucasian male, smoker with COPD who presented to the hospital with symptoms of class 4 CHF including orthopnea, PND, edema, and pleural effusions.  Echocardiogram showed a severely calcified bicuspid aortic valve with severe stenosis, LVH, with LV dysfunction.  Right heart catheterization demonstrated moderate pulmonary hypertension.  Coronary angiogram showed no significant CAD.  He was hospitalized and was treated for his heart failure and prepared for aortic valve replacement.  Prior to surgery, I examined the patient in his hospital room and reviewed results of his echo and cardiac catheterization.  I discussed the indications and expected benefits of aortic valve replacement for treatment of his severe aortic stenosis and insufficiency.  I discussed the major aspects of the operation including the use of general anesthesia and cardiopulmonary bypass, the location of the surgical incisions, and the expected postoperative hospital recovery.  I reviewed with the patient the potential risks to him of aortic valve replacement including risk of stroke, bleeding requiring transfusion, MI, postoperative pulmonary problems including pleural effusion,  infection, arrhythmias including heart block requiring pacemaker, and death.  After reviewing these issues, he demonstrated his understanding and agreed to proceed with surgery under what I felt was an informed consent.  OPERATIVE FINDINGS: 1. Severely calcified and stenotic bicuspid aortic valve with almost     no mobility. 2. Severe LV dysfunction which improved after AVR. 3. No packed cell transfusions required for the surgery. 4. Successful replacement of AVR with a normal functioning 25 mm     pericardial tissue valve without AI.  OPERATIVE PROCEDURE:  The patient was brought to the operating room and placed supine on the operating room table.  General anesthesia was induced under invasive hemodynamic monitoring.  The chest, abdomen, and legs were prepped with Betadine and draped as a sterile field.  A sternal incision was made.  The sternum was retracted and the pericardium was opened.  There was moderate pericardial effusion.  Both pleural spaces were opened and 900 mL of clear fluid was drained from each pleural space.  Pursestrings were placed in the ascending aorta and right atrium and heparin was administered.  When the ACT was documented as being therapeutic, the patient was cannulated and placed on cardiopulmonary bypass.  An LV vent was placed via the right superior pulmonary vein and cardioplegia cannulas were placed both antegrade aortic and retrograde coronary sinus cardioplegia.  The patient was cooled to 32 degrees and an aortic crossclamp was applied.  A liter of cold blood  cardioplegia was delivered in split doses between the antegrade aortic and retrograde coronary sinus catheters.  There was good cardioplegic arrest and septal temperature dropped less than 12 degrees.  Cardioplegia was delivered every 20 minutes while the crossclamp was placed.  A transverse aortotomy was performed.  The aortic valve was inspected. It was heavily calcified, bicuspid, and  immobile.  It was excised.  The annulus was debrided of calcium.  The outflow tract was irrigated with copious amounts of cold saline.  The annulus was sized to a 29 mm pericardial Edwards valve.  Subannular 2-0 Ethibond pledgeted sutures were then placed around the circumference of the annulus numbering 15 sutures total.  The valve was prepared according to protocol.  The sutures were placed through the sewing ring of the valve was seated and sutures were tied.  The valve was checked and found to confirm the annulus well. There was no evidence of obstruction of the coronary ostia or paces between the sutures which could cause a perivalvular leak.  The aortotomy was closed in two layers using running 4-0 Prolene.  The patient was rewarmed.  Air was vented from the coronaries with a dose of retrograde warm blood cardioplegia in the usual de-airing maneuvers.  The crossclamp was removed.  The heart resumed a spontaneous rhythm.  The cardioplegia cannulas were removed.  The patient was rewarmed and reperfused.  Temporary pacing wires were applied.  The aortotomy was hemostatic.  The patient was adequately reperfused and he was weaned off cardiopulmonary bypass on low-dose Milrinone.  Echo showed improved global LV function.  The prosthetic valve was working normally.  Protamine was administered without adverse reaction.  The cannulas were removed.  The mediastinum was irrigated.  Superior pericardial fat was closed over the aorta. Anterior mediastinal chest tube was placed and brought out through separate incision.  The sternum was closed with interrupted steel wire. The pectoralis fascia was closed running #1 Vicryl.  The subcutaneous and skin layers were closed in running Vicryl and sterile dressings were applied.  Total cardiopulmonary bypass time was 104 minutes.     [name], M.D.     PV/MEDQ  D:  01/30/2016  T:  01/30/2016  Job:  [id number]

## 2016-01-30 NOTE — Care Management Note (Signed)
Case Management Note  Patient Details  Name: [name] MRN: [id number] Date of Birth: August 17, 1955  Subjective/Objective:  Pt presented for CHF. Pt is from home with family support of wife.                   Action/Plan: CM discussed HH Needs with patient and the significance to daily weights and monitoring nutrition and fluid intake. Per pt he will not need a HHRN at d/c. No further needs from CM at this time.   Expected Discharge Date:                  Expected Discharge Plan:  Home/Self Care  In-House Referral:  NA  Discharge planning Services  CM Consult  Post Acute Care Choice:  NA Choice offered to:  NA  DME Arranged:  N/A DME Agency:  NA  HH Arranged:  NA HH Agency:     Status of Service:  Completed, signed off  If discussed at Long Length of Stay Meetings, dates discussed:    Additional Comments: 01-26-16 [address] Tomi Bamberger, RN,BSN [phone] Cath done 01/23/16 showed minor CAD, echo showed EF 30-35% with severe AS. Plan for AVR on Monday. CM will reassess home needs before d/c.   01/30/2016 Pt continues to pleasantly refuse HH services  - CM will continue to follow for discharge needs Raynald Blend, RN, BSN [phone]    Cherylann Parr, RN 01/30/2016, 11:06 AM

## 2016-01-31 ENCOUNTER — Inpatient Hospital Stay (HOSPITAL_COMMUNITY): Payer: BLUE CROSS/BLUE SHIELD

## 2016-01-31 ENCOUNTER — Telehealth: Payer: Self-pay | Admitting: Internal Medicine

## 2016-01-31 LAB — BASIC METABOLIC PANEL
Anion gap: 6 (ref 5–15)
BUN: 11 mg/dL (ref 6–20)
CO2: 27 mmol/L (ref 22–32)
Calcium: 8.7 mg/dL — ABNORMAL LOW (ref 8.9–10.3)
Chloride: 98 mmol/L — ABNORMAL LOW (ref 101–111)
Creatinine, Ser: 0.55 mg/dL — ABNORMAL LOW (ref 0.61–1.24)
GFR calc Af Amer: >60 mL/min (ref >60)
GFR calc non Af Amer: >60 mL/min (ref >60)
Glucose, Bld: 118 mg/dL — ABNORMAL HIGH (ref 65–99)
Potassium: 4.3 mmol/L (ref 3.5–5.1)
Sodium: 131 mmol/L — ABNORMAL LOW (ref 135–145)

## 2016-01-31 LAB — GLUCOSE, CAPILLARY
GLUCOSE-CAPILLARY: 111 mg/dL — AB (ref 65–99)
GLUCOSE-CAPILLARY: 120 mg/dL — AB (ref 65–99)
GLUCOSE-CAPILLARY: 126 mg/dL — AB (ref 65–99)
Glucose-Capillary: 124 mg/dL — ABNORMAL HIGH (ref 65–99)
Glucose-Capillary: 119 mg/dL — ABNORMAL HIGH (ref 65–99)

## 2016-01-31 LAB — CBC
HCT: 37.7 % — ABNORMAL LOW (ref 39.0–52.0)
Hemoglobin: 12.4 g/dL — ABNORMAL LOW (ref 13.0–17.0)
MCH: 31.8 pg (ref 26.0–34.0)
MCHC: 32.9 g/dL (ref 30.0–36.0)
MCV: 96.7 fL (ref 78.0–100.0)
Platelets: 95 10*3/uL — ABNORMAL LOW (ref 150–400)
RBC: 3.9 MIL/uL — ABNORMAL LOW (ref 4.22–5.81)
RDW: 13.2 % (ref 11.5–15.5)
WBC: 7.1 10*3/uL (ref 4.0–10.5)

## 2016-01-31 MED ORDER — FUROSEMIDE 10 MG/ML IJ SOLN
20.0000 mg | Freq: Two times a day (BID) | INTRAMUSCULAR | Status: AC
  Administered 2016-01-31 (×2): 20 mg via INTRAVENOUS
  Filled 2016-01-31 (×2): qty 2

## 2016-01-31 MED ORDER — POTASSIUM CHLORIDE CRYS ER 20 MEQ PO TBCR
20.0000 meq | EXTENDED_RELEASE_TABLET | Freq: Once | ORAL | Status: AC
  Administered 2016-01-31: 20 meq via ORAL
  Filled 2016-01-31: qty 1

## 2016-01-31 NOTE — Progress Notes (Addendum)
[facility] DAILY ICU PROGRESS NOTE                   [address]            [place] [address]          [phone]   2 Days Post-Op Procedure(s) (LRB): AORTIC VALVE REPLACEMENT (AVR) with Magna Ease size 25mm (N/A) TRANSESOPHAGEAL ECHOCARDIOGRAM (TEE) (N/A)  Total Length of Stay:  LOS: 8 days   Subjective: Patient states Mucinex helped with cough.  Objective: Vital signs in last 24 hours: Temp:  [97.8 F (36.6 C)-99.9 F (37.7 C)] 98.5 F (36.9 C) (08/02 0841) Pulse Rate:  [39-88] 74 (08/02 0800) Cardiac Rhythm: Normal sinus rhythm (08/02 0800) Resp:  [18-38] 28 (08/02 0800) BP: (91-116)/(51-82) 106/72 (08/02 0800) SpO2:  [92 %-99 %] 95 % (08/02 0800) Weight:  [163 lb 9.3 oz (74.2 kg)] 163 lb 9.3 oz (74.2 kg) (08/02 0500)  Filed Weights   01/29/16 0600 01/30/16 0500 01/31/16 0500  Weight: 163 lb 3.2 oz (74 kg) 164 lb 10.9 oz (74.7 kg) 163 lb 9.3 oz (74.2 kg)    Weight change: -1 lb 1.6 oz (-0.5 kg)   Hemodynamic parameters for last 24 hours: PAP: (27-33)/(9-17) 29/15 CO:  [4.9 L/min-6.8 L/min] 4.9 L/min CI:  [2.5 L/min/m2-3.5 L/min/m2] 2.5 L/min/m2  Intake/Output from previous day: 08/01 0701 - 08/02 0700 In: 2806.4 [P.O.:1350; I.V.:1406.4; IV Piggyback:50] Out: 2025 [Urine:1915; Chest Tube:110]  Intake/Output this shift: Total I/O In: 162.8 [P.O.:120; I.V.:42.8] Out: 225 [Urine:225]  Current Meds: Scheduled Meds: . acetaminophen  1,000 mg Oral Q6H   Or  . acetaminophen (TYLENOL) oral liquid 160 mg/5 mL  1,000 mg Per Tube Q6H  . amiodarone  200 mg Oral BID  . antiseptic oral rinse  7 mL Mouth Rinse BID  . aspirin EC  325 mg Oral Daily   Or  . aspirin  324 mg Per Tube Daily  . bisacodyl  10 mg Oral Daily   Or  . bisacodyl  10 mg Rectal Daily  . cefUROXime (ZINACEF)  IV  1.5 g Intravenous Q12H  . docusate sodium  200 mg Oral Daily  . feeding supplement  1 Container Oral TID BM  . finasteride  5 mg Oral Daily  . fluticasone  furoate-vilanterol  1 puff Inhalation Daily  . guaiFENesin  1,200 mg Oral BID  . insulin aspart  0-24 Units Subcutaneous Q4H  . metoprolol tartrate  12.5 mg Oral BID   Or  . metoprolol tartrate  12.5 mg Per Tube BID  . pantoprazole  40 mg Oral Daily  . sodium chloride flush  10-40 mL Intracatheter Q12H  . sodium chloride flush  3 mL Intravenous Q12H  . tamsulosin  0.4 mg Oral Daily  . umeclidinium bromide  1 puff Inhalation Daily   Continuous Infusions: . sodium chloride Stopped (01/30/16 2100)  . sodium chloride    . sodium chloride Stopped (01/30/16 2000)  . lactated ringers 20 mL/hr at 01/31/16 0800  . lactated ringers Stopped (01/30/16 2000)  . milrinone 0.125 mcg/kg/min (01/31/16 0800)  . phenylephrine (NEO-SYNEPHRINE) Adult infusion Stopped (01/31/16 0145)   PRN Meds:.sodium chloride, levalbuterol, metoprolol, midazolam, morphine injection, ondansetron (ZOFRAN) IV, oxyCODONE, sodium chloride flush, sodium chloride flush, traMADol  General appearance: alert, cooperative and no distress Neurologic: intact Heart: RRR, no murmur Lungs: Slightly diminished at bases Abdomen: soft, non-tender; bowel sounds normal; no masses,  no organomegaly Extremities: No lower extremity edema Wound: Aquacel intact  Lab Results: CBC: Recent Labs  01/30/16 1737 01/30/16 1739 01/31/16 0235  WBC 7.9  --  7.1  HGB 13.0 12.9* 12.4*  HCT 38.5* 38.0* 37.7*  PLT 99*  --  95*   BMET:  Recent Labs  01/30/16 0328  01/30/16 1739 01/31/16 0235  NA 132*  --  133* 131*  K 4.0  --  4.4 4.3  CL 101  --  97* 98*  CO2 27  --   --  27  GLUCOSE 125*  --  121* 118*  BUN 10  --  10 11  CREATININE 0.55*  < > 0.50* 0.55*  CALCIUM 8.5*  --   --  8.7*  < > = values in this interval not displayed.  CMET: Lab Results  Component Value Date   WBC 7.1 01/31/2016   HGB 12.4 (L) 01/31/2016   HCT 37.7 (L) 01/31/2016   PLT 95 (L) 01/31/2016   GLUCOSE 118 (H) 01/31/2016   CHOL 158 01/19/2016   TRIG 55  01/19/2016   HDL 67 01/19/2016   LDLCALC 80 01/19/2016   ALT 22 01/19/2016   AST 16 01/19/2016   NA 131 (L) 01/31/2016   K 4.3 01/31/2016   CL 98 (L) 01/31/2016   CREATININE 0.55 (L) 01/31/2016   BUN 11 01/31/2016   CO2 27 01/31/2016   TSH 2.38 01/19/2016   INR 1.35 01/29/2016   HGBA1C 5.5 01/24/2016    PT/INR:  Recent Labs  01/29/16 1358  LABPROT 16.8*  INR 1.35   Radiology: Dg Chest Port 1 View  Result Date: 01/31/2016 CLINICAL DATA:  Aortic valve replacement EXAM: PORTABLE CHEST 1 VIEW COMPARISON:  01/30/2016 FINDINGS: Interval removal of Swan-Ganz catheter. Right jugular sheath remains in place. No pneumothorax Bibasilar airspace disease left greater than right is unchanged. Probable small left effusion also unchanged. Negative for edema. Aortic valve replacement unchanged IMPRESSION: Swan-Ganz catheter removed Bibasilar airspace disease left greater than right is unchanged. This may represent atelectasis or pneumonia. Electronically Signed   By: Marlan Palau M.D.   On: 01/31/2016 07:15     Assessment/Plan: S/P Procedure(s) (LRB): AORTIC VALVE REPLACEMENT (AVR) with Magna Ease size 50mm (N/A) TRANSESOPHAGEAL ECHOCARDIOGRAM (TEE) (N/A)  1. CV-SR in the 70's this am. On Milrinone drip,Amiodarone 200 mg bid and Lopressor 12.5 mg bid. Will check Co ox in am as trying to wean Milrinone. 2. Pulmonary-History of COPD. On 2 liters of oxygen via [place]. Wean to room air as tolerates. CXR this am shows cardiomegaly, small left pleural effusion, bibasilar atelectasis.Remove chest tube.Continue Mucinex and Ellipta. Encourage incentive spirometer and flutter valve. 3. Volume Overload- Will give Lasix 20 mg bid today 4. ABL anemia-H and H stable at 12.4 and 37.7 5. Mild thrombocytopenia-platelets 95,000 6. CBGs 113/111/120. Pre op HGA1C 5.5.  Will stop accu checks and SS PRN at transfer. 7. Remove foley 8. Advance diet 9. Hope to transfer to floor in am    Elenore Rota 01/31/2016 8:55 AM   Patient with severe CHF and low EF from long-standing severe aortic stenosis Patient with preop severe COPD Weaning milrinone off slowly Continue with diuresis Amiodarone to maintain sinus rhythm for patient with severe LVH and poor LV function  patient examined and medical record reviewed,agree with above note. [name] 01/31/2016

## 2016-01-31 NOTE — Progress Notes (Signed)
Patient ID: Hilman Catapano, male   DOB: January 29, 1956, 60 y.o.   MRN: [id number] EVENING ROUNDS NOTE :     [address]       [place] [address]             [phone]                 2 Days Post-Op Procedure(s) (LRB): AORTIC VALVE REPLACEMENT (AVR) with Magna Ease size 41mm (N/A) TRANSESOPHAGEAL ECHOCARDIOGRAM (TEE) (N/A)  Total Length of Stay:  LOS: 8 days  BP 105/67   Pulse 67   Temp 98.6 F (37 C) (Oral)   Resp (!) 26   Ht 5' 9.5" (1.765 m)   Wt 163 lb 9.3 oz (74.2 kg)   SpO2 93%   BMI 23.81 kg/m   .Intake/Output      08/02 0701 - 08/03 0700   P.O. 120   I.V. (mL/kg) 241 (3.2)   IV Piggyback 50   Total Intake(mL/kg) 411 (5.5)   Urine (mL/kg/hr) 1525 (1.7)   Chest Tube    Total Output 1525   Net -1114         . sodium chloride Stopped (01/30/16 2100)  . sodium chloride    . sodium chloride Stopped (01/30/16 2000)  . lactated ringers Stopped (01/31/16 1700)  . lactated ringers Stopped (01/30/16 2000)  . phenylephrine (NEO-SYNEPHRINE) Adult infusion Stopped (01/31/16 0145)     Lab Results  Component Value Date   WBC 7.1 01/31/2016   HGB 12.4 (L) 01/31/2016   HCT 37.7 (L) 01/31/2016   PLT 95 (L) 01/31/2016   GLUCOSE 118 (H) 01/31/2016   CHOL 158 01/19/2016   TRIG 55 01/19/2016   HDL 67 01/19/2016   LDLCALC 80 01/19/2016   ALT 22 01/19/2016   AST 16 01/19/2016   NA 131 (L) 01/31/2016   K 4.3 01/31/2016   CL 98 (L) 01/31/2016   CREATININE 0.55 (L) 01/31/2016   BUN 11 01/31/2016   CO2 27 01/31/2016   TSH 2.38 01/19/2016   INR 1.35 01/29/2016   HGBA1C 5.5 01/24/2016   Stable day Renal function stable  Delight Ovens MD  Beeper [phone] Office [phone] 01/31/2016 7:18 PM

## 2016-01-31 NOTE — Telephone Encounter (Signed)
Received Echo CD from [facility] as requested by DR [facility].  Given to [facility] for Dr Rennis Golden to review. lp

## 2016-02-01 ENCOUNTER — Inpatient Hospital Stay (HOSPITAL_COMMUNITY): Payer: BLUE CROSS/BLUE SHIELD

## 2016-02-01 LAB — TYPE AND SCREEN
ABO/RH(D): O POS
Antibody Screen: NEGATIVE
Unit division: 0
Unit division: 0

## 2016-02-01 LAB — GLUCOSE, CAPILLARY
GLUCOSE-CAPILLARY: 123 mg/dL — AB (ref 65–99)
GLUCOSE-CAPILLARY: 154 mg/dL — AB (ref 65–99)
Glucose-Capillary: 109 mg/dL — ABNORMAL HIGH (ref 65–99)
Glucose-Capillary: 116 mg/dL — ABNORMAL HIGH (ref 65–99)
Glucose-Capillary: 129 mg/dL — ABNORMAL HIGH (ref 65–99)
Glucose-Capillary: 143 mg/dL — ABNORMAL HIGH (ref 65–99)

## 2016-02-01 LAB — CARBOXYHEMOGLOBIN
CARBOXYHEMOGLOBIN: 2 % — AB (ref 0.5–1.5)
METHEMOGLOBIN: 0.8 % (ref 0.0–1.5)
O2 SAT: 72.2 %
Total hemoglobin: 12 g/dL — ABNORMAL LOW (ref 13.5–18.0)

## 2016-02-01 LAB — CBC
HEMATOCRIT: 35.8 % — AB (ref 39.0–52.0)
Hemoglobin: 11.9 g/dL — ABNORMAL LOW (ref 13.0–17.0)
MCH: 32.2 pg (ref 26.0–34.0)
MCHC: 33.2 g/dL (ref 30.0–36.0)
MCV: 96.8 fL (ref 78.0–100.0)
PLATELETS: 95 10*3/uL — AB (ref 150–400)
RBC: 3.7 MIL/uL — AB (ref 4.22–5.81)
RDW: 12.9 % (ref 11.5–15.5)
WBC: 6.8 10*3/uL (ref 4.0–10.5)

## 2016-02-01 LAB — BASIC METABOLIC PANEL
ANION GAP: 7 (ref 5–15)
BUN: 13 mg/dL (ref 6–20)
CO2: 32 mmol/L (ref 22–32)
Calcium: 9 mg/dL (ref 8.9–10.3)
Chloride: 95 mmol/L — ABNORMAL LOW (ref 101–111)
Creatinine, Ser: 0.53 mg/dL — ABNORMAL LOW (ref 0.61–1.24)
GLUCOSE: 120 mg/dL — AB (ref 65–99)
POTASSIUM: 4.1 mmol/L (ref 3.5–5.1)
Sodium: 134 mmol/L — ABNORMAL LOW (ref 135–145)

## 2016-02-01 MED ORDER — ONDANSETRON HCL 4 MG/2ML IJ SOLN: 4.0000 mg | Freq: Four times a day (QID) | INTRAMUSCULAR | Status: DC | PRN

## 2016-02-01 MED ORDER — INSULIN ASPART 100 UNIT/ML ~~LOC~~ SOLN
0.0000 [IU] | Freq: Three times a day (TID) | SUBCUTANEOUS | Status: DC
  Administered 2016-02-01 (×2): 2 [IU] via SUBCUTANEOUS

## 2016-02-01 MED ORDER — MOVING RIGHT ALONG BOOK
Freq: Once | Status: AC
  Administered 2016-02-01: 08:00:00
  Filled 2016-02-01: qty 1

## 2016-02-01 MED ORDER — SODIUM CHLORIDE 0.9% FLUSH: 3.0000 mL | INTRAVENOUS | Status: DC | PRN

## 2016-02-01 MED ORDER — ENSURE ENLIVE PO LIQD
237.0000 mL | Freq: Three times a day (TID) | ORAL | Status: DC
  Administered 2016-02-02 – 2016-02-04 (×4): 237 mL via ORAL

## 2016-02-01 MED ORDER — ONDANSETRON HCL 4 MG PO TABS
4.0000 mg | ORAL_TABLET | Freq: Four times a day (QID) | ORAL | Status: DC | PRN
Start: 2016-02-01 — End: 2016-02-05

## 2016-02-01 MED ORDER — FUROSEMIDE 10 MG/ML IJ SOLN
20.0000 mg | Freq: Two times a day (BID) | INTRAMUSCULAR | Status: AC
  Administered 2016-02-01 (×2): 20 mg via INTRAVENOUS
  Filled 2016-02-01 (×2): qty 2

## 2016-02-01 MED ORDER — ALUM & MAG HYDROXIDE-SIMETH 200-200-20 MG/5ML PO SUSP
15.0000 mL | ORAL | Status: DC | PRN
Start: 2016-02-01 — End: 2016-02-05

## 2016-02-01 MED ORDER — SODIUM CHLORIDE 0.9 % IV SOLN: 250.0000 mL | INTRAVENOUS | Status: DC | PRN

## 2016-02-01 MED ORDER — FUROSEMIDE 40 MG PO TABS
40.0000 mg | ORAL_TABLET | Freq: Every day | ORAL | Status: DC
  Administered 2016-02-01: 40 mg via ORAL
  Filled 2016-02-01: qty 1

## 2016-02-01 MED ORDER — POTASSIUM CHLORIDE CRYS ER 20 MEQ PO TBCR
20.0000 meq | EXTENDED_RELEASE_TABLET | Freq: Every day | ORAL | Status: DC
  Administered 2016-02-02 – 2016-02-05 (×4): 20 meq via ORAL
  Filled 2016-02-01 (×4): qty 1

## 2016-02-01 MED ORDER — CARVEDILOL 6.25 MG PO TABS
6.2500 mg | ORAL_TABLET | Freq: Two times a day (BID) | ORAL | Status: DC
  Administered 2016-02-01: 6.25 mg via ORAL
  Filled 2016-02-01 (×2): qty 1

## 2016-02-01 MED ORDER — SODIUM CHLORIDE 0.9% FLUSH
3.0000 mL | Freq: Two times a day (BID) | INTRAVENOUS | Status: DC
  Administered 2016-02-01 – 2016-02-03 (×4): 3 mL via INTRAVENOUS

## 2016-02-01 MED ORDER — MAGNESIUM SULFATE 2 GM/50ML IV SOLN
2.0000 g | Freq: Once | INTRAVENOUS | Status: AC
  Administered 2016-02-01: 2 g via INTRAVENOUS
  Filled 2016-02-01: qty 50

## 2016-02-01 MED ORDER — POTASSIUM CHLORIDE 10 MEQ/50ML IV SOLN
10.0000 meq | INTRAVENOUS | Status: AC
  Administered 2016-02-01 (×2): 10 meq via INTRAVENOUS
  Filled 2016-02-01: qty 50

## 2016-02-01 NOTE — Progress Notes (Signed)
CARDIAC REHAB PHASE I   PRE:  Rate/Rhythm: 64 SR c/ PVCs  BP:  Sitting: 90/67        SaO2: 93 RA  MODE:  Ambulation: 440 ft   POST:  Rate/Rhythm: 78 SR c/ PVCs  BP:  Sitting: 106/65         SaO2: 98 RA  Pt ambulated 440 ft on RA, IV, foley catheter, rolling walker, assist x1, steady gait, tolerated well with no complaints. Pt BP somewhat low, pt asymptomatic. Frequent PVCs. Pt states he can sometimes feel his "heart skip." Pt to recliner after walk, feet elevated, call bell within reach. Will follow.  [id number] Joylene Grapes, RN, BSN 02/01/2016 2:21 PM

## 2016-02-01 NOTE — Progress Notes (Signed)
Introducer d/c'd per order and per protocol. Pressure bandage applied. Pt instructed to lie in bed for one hour. Call bell and phone within reach. Wife at bedside. Will continue to monitor.

## 2016-02-01 NOTE — Progress Notes (Addendum)
3 Days Post-Op Procedure(s) (LRB): AORTIC VALVE REPLACEMENT (AVR) with Magna Ease size 9mm (N/A) TRANSESOPHAGEAL ECHOCARDIOGRAM (TEE) (N/A) Subjective: Status post aVR with bioprosthetic valve for severe bicuspid AS-AI with LV dilatation and cardiomyopathy  Patient with postop intermittent A. fib and PVCs on oral amiodarone Patient had severe preoperative heart failure with large bilateral pleural effusions and is on postoperative Lasix  Patient has severe COPD and has been on preop and postop bronchodilator therapy.  Patient has severe malnutrition and is on nutritional supplementation  Patient has been ambulating, he is now off milrinone and his transferred to stepdown for further recovery and titration of medications.  Objective: Vital signs in last 24 hours: Temp:  [97.8 F (36.6 C)-98.6 F (37 C)] 98.1 F (36.7 C) (08/03 1530) Pulse Rate:  [59-79] 67 (08/03 1530) Cardiac Rhythm: Heart block;Normal sinus rhythm;Bundle branch block (08/03 1016) Resp:  [15-34] 20 (08/03 1530) BP: (94-116)/(56-81) 94/56 (08/03 1530) SpO2:  [90 %-96 %] 93 % (08/03 1530) Weight:  [160 lb 0.9 oz (72.6 kg)] 160 lb 0.9 oz (72.6 kg) (08/03 0500)  Hemodynamic parameters for last 24 hours:  stable  Intake/Output from previous day: 08/02 0701 - 08/03 0700 In: 781 [P.O.:480; I.V.:251; IV Piggyback:50] Out: 3345 [Urine:3345] Intake/Output this shift: Total I/O In: -  Out: 140 [Urine:140]  Alert and comfortable Scattered rhonchi No murmur of AI Minimal peripheral edema Neuro intact  Lab Results:  Recent Labs  01/31/16 0235 02/01/16 0510  WBC 7.1 6.8  HGB 12.4* 11.9*  HCT 37.7* 35.8*  PLT 95* 95*   BMET:  Recent Labs  01/31/16 0235 02/01/16 0510  NA 131* 134*  K 4.3 4.1  CL 98* 95*  CO2 27 32  GLUCOSE 118* 120*  BUN 11 13  CREATININE 0.55* 0.53*  CALCIUM 8.7* 9.0    PT/INR: No results for input(s): LABPROT, INR in the last 72 hours. ABG    Component Value Date/Time   PHART 7.380 01/29/2016 1859   HCO3 25.0 (H) 01/29/2016 1859   TCO2 24 01/30/2016 1739   ACIDBASEDEF 2.0 01/29/2016 1801   O2SAT 72.2 02/01/2016 0515   CBG (last 3)   Recent Labs  02/01/16 0341 02/01/16 0712 02/01/16 1133  GLUCAP 123* 109* 143*    Assessment/Plan: S/P Procedure(s) (LRB): AORTIC VALVE REPLACEMENT (AVR) with Magna Ease size 23mm (N/A) TRANSESOPHAGEAL ECHOCARDIOGRAM (TEE) (N/A) Mobilize Diuresis Plan for transfer to step-down: see transfer orders Do not plan Coumadin for this patient due to compliance issues. Smoking cessation has been discussed multiple times with patient.  LOS: 9 days    [name] 02/01/2016

## 2016-02-01 NOTE — Progress Notes (Signed)
CCMD called stating pt had a 22 beat run of V-tach. RN checked pt. Pt AAOx4. No complaints of pain. Just states he's ready for a nap. PA paged. Will continue to monitor.

## 2016-02-01 NOTE — Progress Notes (Signed)
Patient lying in bed, no needs at this time. Will let us know if he wants to walk again Call light within reach.

## 2016-02-01 NOTE — Progress Notes (Signed)
Patient ambulated from 2S14 to 2W21 with wheelchair-minimal assist., patient ambulated well. Patient up to chair, placed on tele, call bell within reach, receiving RN at bedside. SCDs and other patient belongings at bedside. Wife walked over with [place]. No questions from receiving RN or patient/family.  Hermina Barters, RN

## 2016-02-02 ENCOUNTER — Inpatient Hospital Stay (HOSPITAL_COMMUNITY): Payer: BLUE CROSS/BLUE SHIELD

## 2016-02-02 DIAGNOSIS — I4891 Unspecified atrial fibrillation: Secondary | ICD-10-CM

## 2016-02-02 LAB — CBC
HCT: 33.3 % — ABNORMAL LOW (ref 39.0–52.0)
Hemoglobin: 11.2 g/dL — ABNORMAL LOW (ref 13.0–17.0)
MCH: 32.2 pg (ref 26.0–34.0)
MCHC: 33.6 g/dL (ref 30.0–36.0)
MCV: 95.7 fL (ref 78.0–100.0)
Platelets: 127 10*3/uL — ABNORMAL LOW (ref 150–400)
RBC: 3.48 MIL/uL — ABNORMAL LOW (ref 4.22–5.81)
RDW: 12.8 % (ref 11.5–15.5)
WBC: 6.1 10*3/uL (ref 4.0–10.5)

## 2016-02-02 LAB — BASIC METABOLIC PANEL
Anion gap: 8 (ref 5–15)
BUN: 16 mg/dL (ref 6–20)
CO2: 31 mmol/L (ref 22–32)
Calcium: 8.9 mg/dL (ref 8.9–10.3)
Chloride: 94 mmol/L — ABNORMAL LOW (ref 101–111)
Creatinine, Ser: 0.66 mg/dL (ref 0.61–1.24)
GFR calc Af Amer: >60 mL/min (ref >60)
GFR calc non Af Amer: >60 mL/min (ref >60)
Glucose, Bld: 110 mg/dL — ABNORMAL HIGH (ref 65–99)
Potassium: 3.8 mmol/L (ref 3.5–5.1)
Sodium: 133 mmol/L — ABNORMAL LOW (ref 135–145)

## 2016-02-02 LAB — GLUCOSE, CAPILLARY
GLUCOSE-CAPILLARY: 113 mg/dL — AB (ref 65–99)
GLUCOSE-CAPILLARY: 115 mg/dL — AB (ref 65–99)

## 2016-02-02 MED ORDER — CARVEDILOL 3.125 MG PO TABS
3.1250 mg | ORAL_TABLET | Freq: Two times a day (BID) | ORAL | Status: DC
  Administered 2016-02-02 – 2016-02-03 (×2): 3.125 mg via ORAL
  Filled 2016-02-02 (×2): qty 1

## 2016-02-02 MED ORDER — FUROSEMIDE 40 MG PO TABS
40.0000 mg | ORAL_TABLET | Freq: Every day | ORAL | Status: DC
  Administered 2016-02-02 – 2016-02-05 (×4): 40 mg via ORAL
  Filled 2016-02-02 (×4): qty 1

## 2016-02-02 MED ORDER — POTASSIUM CHLORIDE CRYS ER 20 MEQ PO TBCR
20.0000 meq | EXTENDED_RELEASE_TABLET | Freq: Once | ORAL | Status: AC
  Administered 2016-02-02: 20 meq via ORAL
  Filled 2016-02-02: qty 1

## 2016-02-02 NOTE — Progress Notes (Addendum)
    Subjective:  Denies CP or dyspnea   Objective:  Vitals:   02/01/16 1014 02/01/16 1530 02/01/16 2107 02/02/16 0420  BP: 101/69 (!) 94/56 (!) 107/56 (!) 93/53  Pulse: 67 67 72   Resp: (!) 22 20 18    Temp: 98.1 F (36.7 C) 98.1 F (36.7 C) 98.2 F (36.8 C) 97.8 F (36.6 C)  TempSrc: Oral Oral Oral Oral  SpO2: 95% 93% 95% 93%  Weight:    157 lb (71.2 kg)  Height:        Intake/Output from previous day:  Intake/Output Summary (Last 24 hours) at 02/02/16 0856 Last data filed at 02/02/16 0434  Gross per 24 hour  Intake              240 ml  Output             2605 ml  Net            -2365 ml    Physical Exam: Physical exam: Well-developed well-nourished in no acute distress.  Skin is warm and dry.  HEENT is normal.  Neck is supple.  Chest mildly diminished BS bases Cardiovascular exam is regular rate and rhythm.  Abdominal exam nontender or distended. No masses palpated. Extremities show no edema. neuro grossly intact    Lab Results: Basic Metabolic Panel:  Recent Labs  91/91/66 1737  02/01/16 0510 02/02/16 0255  NA  --   < > 134* 133*  K  --   < > 4.1 3.8  CL  --   < > 95* 94*  CO2  --   < > 32 31  GLUCOSE  --   < > 120* 110*  BUN  --   < > 13 16  CREATININE 0.55*  < > 0.53* 0.66  CALCIUM  --   < > 9.0 8.9  MG 1.9  --   --   --   < > = values in this interval not displayed. CBC:  Recent Labs  02/01/16 0510 02/02/16 0255  WBC 6.8 6.1  HGB 11.9* 11.2*  HCT 35.8* 33.3*  MCV 96.8 95.7  PLT 95* 127*     Assessment/Plan:  1 status post aortic valve replacement-patient doing well postoperatively. We'll arrange repeat echocardiogram approximately 3 months after discharge for baseline scan status post AVR. 2 Cardiomyopathy-hopefully LV function will improve following AVR. Continue carvedilol. We will try and add an ACE inhibitor later if blood pressure allows. LV function will be reassessed in 3 months with repeat echocardiogram. 3 nonsustained  ventricular tachycardia-noted on telemetry. Continue beta blocker. 4 postoperative volume excess-continue Lasix at present dose. 5 postoperative atrial fibrillation-patient remains in sinus rhythm. Continue amiodarone. Decrease to 200 mg daily in approximately 4 weeks. 6 severe COPD  Olga Millers 02/02/2016, 8:56 AM

## 2016-02-02 NOTE — Progress Notes (Signed)
CARDIAC REHAB PHASE I   PRE:  Rate/Rhythm: 67 SR PVCs  BP:  Supine:   Sitting: 105/56  Standing:    SaO2: 97%RA  MODE:  Ambulation: 550 ft   POST:  Rate/Rhythm: 90 SR PVCs  BP:  Supine:   Sitting: 113/77  Standing:    SaO2: 95%RA 1025-1122 Pt walked 550 ft with hand held asst with steady gait. Tolerated well. Gave CHF booklet and reviewed zones with pt and wife who voiced understanding. Gave low sodium handouts and discussed 2000 mg restriction. Discussed smoking cessation and gave handout and fake cigarette. Pt plans to quit cold [place]. Discussed CRP 2 and will refer to [place]. Pt has seen discharge video. Encouraged IS , flutter valve and sternal precautions.   Luetta Nutting, RN BSN  02/02/2016 11:20 AM

## 2016-02-02 NOTE — Progress Notes (Signed)
Patient woke up sweating. Glucose was 113, not in any pain vitals stable. Room door opened so room would cool down, pt giving cold water. Will continue to monitor.

## 2016-02-02 NOTE — Discharge Summary (Signed)
Physician Discharge Summary  Patient ID: [name] MRN: [id number] DOB/AGE: 1956/04/20 60 y.o.  Admit date: 01/23/2016 Discharge date: 02/05/2016  Admission Diagnoses: Severe aortic stenosis  Discharge Diagnoses:  Principal Problem:   Acute systolic (congestive) heart failure (HCC) Active Problems:   Severe aortic stenosis   Non-ischemic cardiomyopathy (HCC)   Arterial hypotension   COPD (chronic obstructive pulmonary disease) (HCC)   CAD- minor CAD at cath    BPH (benign prostatic hyperplasia)   Aortic stenosis, severe   Atrial fibrillation [facility])  Patient Active Problem List   Diagnosis Date Noted  . Atrial fibrillation (HCC) 02/02/2016  . Aortic stenosis, severe 01/29/2016  . CAD- minor CAD at cath  01/24/2016  . BPH (benign prostatic hyperplasia) 01/24/2016  . COPD (chronic obstructive pulmonary disease) (HCC)   . Acute systolic (congestive) heart failure (HCC) 01/23/2016  . Severe aortic stenosis 01/19/2016  . Non-ischemic cardiomyopathy (HCC) 01/19/2016  . Shortness of breath 01/19/2016  . Other fatigue 01/19/2016  . Screening for lipid disorders 01/19/2016  . Arterial hypotension 01/19/2016  . MR (mitral regurgitation) 01/19/2016    HPI:  [name] is a 60 y.o. male he was kindly referred to me for evaluation of new onset shortness of breath. He saw his primary care provider who had been following him for an abnormal cardiac murmur in the past. In fact [name] had previously seen a cardiologist in [place], probably more than 5 years ago who did an echocardiogram and then she had valvular heart disease which could get worse over time. He had not followed up with his cardiologist. On presentation last month to his primary care provider he was notably short of breath and underwent a chest x-ray which showed some mild pulmonary edema as well as COPD changes. He underwent office spirometry which indicated obstruction and possible restriction with some bronchodilator  reversibility and an FEV1 less than 80% predicted. Subsequently he was referred to an echocardiogram which was performed at [facility]. I reviewed the interpretation which indicates mild LV dilatation and mild global hypokinesis with an EF of 45-50%. There was marketed dilatation of the left atrium, mild aortic insufficiency and severe aortic stenosis. The peak and mean gradients across the valve or 71 mmHg and 50 mmHg, respectively with a calculated aortic valve area of 0.62 cm. There was also mild to moderate mitral regurgitation. The study was performed on 12/01/2015. [name], but is still able to do physical activities. He denies any chest pain. Family history indicates death by natural causes of family members but no known coronary disease.  The patient underwent cardiac catheterization on 01/23/2016. He was then referred to [name] MD for surgical opinion and he agreed with recommendations to proceed with aortic valve replacement.    Discharged Condition: good  Hospital Course: The patient presented with severe aortic stenosis and class IV CHF with severe LV dysfunction. He underwent cardiac catheterization. He was not felt to require CABG and on 01/29/2016 he was taken to the operating room where he underwent aortic valve replacement as described low in the note. He tolerated the procedure well was taken to the surgical intensive care unit in stable condition  Post operative Hospital course: He was extubated uneventfully and has remained neurologically intact. All routine lines, monitors and drainage devices have been discontinued in the standard fashion. He did have postoperative atrial fibrillation and was placed on amiodarone. He has required aggressive postop bronchodilator  and pulmonary toilet therapy for COPD. He did initially require some inotropic support but this has been weaned off. He has significant  postoperative volume overload but is responding to Lasix and will need further as an outpatient. Patient is not felt to be a Coumadin candidate per Dr. Donata Clay. He has an expected acute blood loss anemia which has stabilized. He has a mild thrombocytopenia which is improved. Blood sugars have been under adequate control. He did have some difficulty with urinary retention and has been started on Flomax in addition to his Proscar. He has been able to void on his own. Incisions are healing well. He is progressing well with his rehabilitation. He unfortunately was in a fib with a rate of 90-110's the morning of 08/05. As a result, I increased his Amiodarone to 400 mg bid and Coreg to 6.25 mg bid. He was also started on Coumadin. He is currently on Coumadin 5 mg and his last INR was 1.05.He will be given 7.5 mg of Coumadin tonight and go home on 5 mg of Coumadin. He will need to call to have his PT and INR checked on Wednesday 02/07/2016. He remains in a flutter with a controlled rate. Because of his labile BP, he has not been receiving Coreg 6.25 mg bid daily. As a result, I will decrease this to 3.125 mg bid and decrease his Amiodarone was well. In addition, per Dr. Donata Clay, he is to remain on Lasix 40 mg daily (has low LVEF). He is felt to be stable for discharge today.   Consults: cardiology  Significant Diagnostic Studies: routine post-op labs/CXR's  Treatments: surgery:  DATE OF PROCEDURE:  01/29/2016 DATE OF DISCHARGE:                              OPERATIVE REPORT   OPERATION:  Aortic valve replacement with a 25 mm pericardial tissue valve [facility] Ease, serial #[id number]).  PREOPERATIVE DIAGNOSIS:  Severe bicuspid aortic stenosis and insufficiency.  POSTOPERATIVE DIAGNOSIS:  Severe bicuspid aortic stenosis and insufficiency.  ANESTHESIA:  General.  SURGEON:  [name], M.D.  ASSISTANT:  Doree Fudge, PA-C Discharge Exam: Blood pressure 115/76, pulse (!)  101, temperature 98.7 F (37.1 C), temperature source Oral, resp. rate 20, height 5' 9.5" (1.765 m), weight 157 lb 6.4 oz (71.4 kg), SpO2 95 %.  Cardiovascular: IRRR IRRR, no murmur Pulmonary: Slightly diminished at bases Abdomen: Soft, non tender, bowel sounds present. Extremities: No lower extremity edema. Wounds: Clean and dry.  No erythema or signs of infection.  Disposition: Final discharge disposition not confirmed  Discharge Instructions    Amb Referral to Cardiac Rehabilitation    Complete by:  As directed   Referring to [place] Phase 2   Diagnosis:  Valve Replacement   Valve:  Aortic       Medication List    TAKE these medications   albuterol (5 MG/ML) 0.5% nebulizer solution Commonly known as:  PROVENTIL Take 2.5 mg by nebulization 2 (two) times daily as needed for wheezing or shortness of breath.   amiodarone 200 MG tablet Commonly known as:  PACERONE Take 1 tablet (200 mg total) by mouth 2 (two) times daily. For one week;then take Amiodarone 200 mg by mouth daily thereafter   aspirin 81 MG tablet Take 1 tablet (81 mg total) by mouth daily. What changed:  medication strength  how much to take   BREO ELLIPTA 100-25 MCG/INH Aepb Generic  drug:  fluticasone furoate-vilanterol USE 1 INHALATION DAILY   carvedilol 3.125 MG tablet Commonly known as:  COREG Take 1 tablet (3.125 mg total) by mouth 2 (two) times daily with a meal.   finasteride 5 MG tablet Commonly known as:  PROSCAR Take 5 mg by mouth daily.   furosemide 40 MG tablet Commonly known as:  LASIX Take 1 tablet (40 mg total) by mouth daily.   INCRUSE ELLIPTA 62.5 MCG/INH Aepb Generic drug:  umeclidinium bromide Take 1 puff by mouth daily.   oxyCODONE 5 MG immediate release tablet Commonly known as:  Oxy IR/ROXICODONE Take 1 tablet (5 mg total) by mouth every 4 (four) hours as needed for severe pain.   potassium chloride SA 20 MEQ tablet Commonly known as:  K-DUR,KLOR-CON Take 1 tablet (20  mEq total) by mouth daily.   tamsulosin 0.4 MG Caps capsule Commonly known as:  FLOMAX Take 0.4 mg by mouth daily.   warfarin 5 MG tablet Commonly known as:  COUMADIN Take 1 tablet (5 mg total) by mouth one time only at 6 PM.     The patient has been discharged on:   1.Beta Blocker:  Yes [ y  ]                              No   [   ]                              If No, reason:  2.Ace Inhibitor/ARB: Yes [   ]                                     No  [ n   ]                                     If No, reason:BP low  3.Statin:   Yes [   ]                  No  [n   ]                  If No, reason:No CAD  4.Marlowe KaysValentino Hue  [ y  ]                  No   [   ]                  If No, reason:   Follow-up Information    Mikey Bussing, MD .   Specialty:  Cardiothoracic Surgery Why:  Appointment to see the surgeon on 02/28/16  at 2 PM. Please obtain a chest x-ray [facility] at 1:30 PM. [place] is located in the same office complex. [place] [facility] is located in the same office complex as the [facility] adviser. Contact information: [address] [place] [address] [phone]        Chrystie Nose, MD .   Specialty:  Cardiology Why:  Call to have PT and INR (on Coumadin for a fib, INR goal 2-2.5) drawn on Wednesday 02/07/2016. Also, call for a 2 week follow up appointment Call for a 2 week follow up appointment  Contact information: [address] [place] [address] [place] [address] [phone]            Signed: Ardelle Balls PA-C 02/05/2016, 9:30 AM

## 2016-02-02 NOTE — Progress Notes (Addendum)
External pacing wires discontinued per order.  Pt tolerated well.  Bedrest until 1220.  Prune juice also given per PA care instruction.

## 2016-02-02 NOTE — Progress Notes (Addendum)
      [address]       [place] [address]             [phone]        4 Days Post-Op Procedure(s) (LRB): AORTIC VALVE REPLACEMENT (AVR) with Magna Ease size 79mm (N/A) TRANSESOPHAGEAL ECHOCARDIOGRAM (TEE) (N/A)  Subjective: Patient without specific complaints this am.  Objective: Vital signs in last 24 hours: Temp:  [97.8 F (36.6 C)-98.2 F (36.8 C)] 97.8 F (36.6 C) (08/04 0420) Pulse Rate:  [67-72] 72 (08/03 2107) Cardiac Rhythm: Heart block;Normal sinus rhythm (08/04 0702) Resp:  [18-25] 18 (08/03 2107) BP: (93-107)/(53-80) 93/53 (08/04 0420) SpO2:  [92 %-95 %] 93 % (08/04 0420) Weight:  [157 lb (71.2 kg)] 157 lb (71.2 kg) (08/04 0420)  Pre op weight 74 kg Current Weight  02/02/16 157 lb (71.2 kg)       Intake/Output from previous day: 08/03 0701 - 08/04 0700 In: 240 [P.O.:240] Out: 2640 [Urine:2640]   Physical Exam:  Cardiovascular: RRR, no murmur Pulmonary: Slightly diminished at bases Abdomen: Soft, non tender, bowel sounds present. Extremities: No lower extremity edema. Wounds: Clean and dry.  No erythema or signs of infection.  Lab Results: CBC: Recent Labs  02/01/16 0510 02/02/16 0255  WBC 6.8 6.1  HGB 11.9* 11.2*  HCT 35.8* 33.3*  PLT 95* 127*   BMET:  Recent Labs  02/01/16 0510 02/02/16 0255  NA 134* 133*  K 4.1 3.8  CL 95* 94*  CO2 32 31  GLUCOSE 120* 110*  BUN 13 16  CREATININE 0.53* 0.66  CALCIUM 9.0 8.9    PT/INR:  Lab Results  Component Value Date   INR 1.35 01/29/2016   INR 0.98 01/28/2016   INR 1.1 01/19/2016   ABG:  INR: Will add last result for INR, ABG once components are confirmed Will add last 4 CBG results once components are confirmed  Assessment/Plan:  1. CV - Previous intermittent a fib.First degree heart block and labile BP this am. On Coreg 6.25 mg bid and Amiodarone 200 mg bid. Will decrease Coreg and monitor BP and HR. Per Dr. Donata Clay, NOT a Coumadin candidate. 2.   Pulmonary - On room air. History of COPD. Continue current medication. CXR this am shows no pneumothorax, small bilateral pleural effusions and atelectasis, and improving pulmonary edema. Encourage incentive spirometer 3. Volume Overload - On Lasix 40 mg daily. Will hold this am and give this afternoon if BP improved 4.  Acute blood loss anemia - H and H stable at 11.2 and 33.3 5. Mild thrombocytopenia-platelets up to 127,000 6. Supplement potassium 7. CBGs 154/113/115. Pre op HGA1C 5.5. Stop accu checks and SS PRN 8. Per patient request, prune juice this am 9. Remove EPW 10. Urinary retention-started on Flomax and already on Proscar. Will give voiding trial. If fails, will go home with foley 11. Hopefully, home this weekend  ZIMMERMAN,DONIELLE MPA-C 02/02/2016,8:40 AM  I have seen and examined the patient and agree with the assessment and plan as outlined.  Voiding urine w/out difficulty since foley removed.  Tentatively plan d/c home tomorrow  Purcell Nails, MD 02/02/2016 3:40 PM

## 2016-02-02 NOTE — Progress Notes (Signed)
Pt voiding without difficulty, bladder scan showed 45cc urine.  Pt also reports successfully having three bowel movements.

## 2016-02-02 NOTE — Progress Notes (Signed)
Patient sitting up in chair, no needs at this time, call light within reach 

## 2016-02-02 NOTE — Progress Notes (Signed)
Foley catheter discontinued per order.  RN to complete bladder scan at 1430 and notify PA if 200cc or greater.

## 2016-02-03 DIAGNOSIS — I480 Paroxysmal atrial fibrillation: Secondary | ICD-10-CM

## 2016-02-03 MED ORDER — WARFARIN - PHYSICIAN DOSING INPATIENT: Freq: Every day | Status: DC

## 2016-02-03 MED ORDER — WARFARIN VIDEO
Freq: Once | Status: AC
  Administered 2016-02-03: 16:00:00

## 2016-02-03 MED ORDER — CARVEDILOL 6.25 MG PO TABS
6.2500 mg | ORAL_TABLET | Freq: Two times a day (BID) | ORAL | Status: DC
  Administered 2016-02-04: 6.25 mg via ORAL
  Filled 2016-02-03 (×3): qty 1

## 2016-02-03 MED ORDER — WARFARIN SODIUM 5 MG PO TABS
5.0000 mg | ORAL_TABLET | Freq: Every day | ORAL | Status: DC
  Administered 2016-02-03 – 2016-02-04 (×2): 5 mg via ORAL
  Filled 2016-02-03 (×2): qty 1

## 2016-02-03 MED ORDER — COUMADIN BOOK
Freq: Once | Status: AC
  Administered 2016-02-03: 16:00:00
  Filled 2016-02-03: qty 1

## 2016-02-03 MED ORDER — AMIODARONE HCL 200 MG PO TABS
400.0000 mg | ORAL_TABLET | Freq: Two times a day (BID) | ORAL | Status: DC
  Administered 2016-02-03 – 2016-02-05 (×5): 400 mg via ORAL
  Filled 2016-02-03 (×5): qty 2

## 2016-02-03 MED ORDER — ASPIRIN EC 81 MG PO TBEC
81.0000 mg | DELAYED_RELEASE_TABLET | Freq: Every day | ORAL | Status: DC
  Administered 2016-02-04 – 2016-02-05 (×2): 81 mg via ORAL
  Filled 2016-02-03 (×2): qty 1

## 2016-02-03 NOTE — Progress Notes (Signed)
Central monitoring called to advise patient in A fib. HR went up briefly to 129 and came back down. Patient lying in bed, currently asymptomatic. Patient has also been doing a lot of coughing trying to get phlegm up. She will advise if any changes, will continue to monitor

## 2016-02-03 NOTE — Progress Notes (Signed)
CARDIAC REHAB PHASE I   PRE:  Rate/Rhythm: 105 a fib  BP:  Sitting: 118/63        SaO2: 97 RA  MODE:  Ambulation: 690 ft   POST:  Rate/Rhythm: 106 a fib  BP:  Sitting: 127/85         SaO2: 95 RA  Pt ambulated 690 ft on RA, independent, steady gait, tolerated well with no complaints. Pt states he has no questions regarding education at this time. Encouraged additional ambulation x2 today. Pt to recliner after walk, call bell within reach.  [phone] Joylene Grapes, RN, BSN 02/03/2016 10:53 AM

## 2016-02-03 NOTE — Progress Notes (Signed)
Per RN report this am patient in atrial fib,currently on monitor patient rate between 90-121 Atrial Fib. Doree Fudge PAC on floor into see patient will monitor patient. BP 101/70 Trystan Akhtar, Randall An RN

## 2016-02-03 NOTE — Progress Notes (Signed)
    Subjective:  Denies CP or dyspnea; mild palpitations   Objective:  Vitals:   02/02/16 1215 02/02/16 2018 02/03/16 0443 02/03/16 0759  BP: 105/67 (!) 96/56 100/64 101/70  Pulse: 71 71 68 (!) 116  Resp:  20 20   Temp:  98 F (36.7 C) 98.7 F (37.1 C)   TempSrc:   Oral   SpO2: 97% 98% 94%   Weight:   157 lb 12.8 oz (71.6 kg)   Height:        Intake/Output from previous day:  Intake/Output Summary (Last 24 hours) at 02/03/16 0834 Last data filed at 02/03/16 0441  Gross per 24 hour  Intake                0 ml  Output              700 ml  Net             -700 ml    Physical Exam: Physical exam: Well-developed well-nourished in no acute distress.  Skin is warm and dry.  HEENT is normal.  Neck is supple.  Chest diminished BS LLL Cardiovascular exam is irregular Abdominal exam nontender or distended. No masses palpated. Extremities show no edema. neuro grossly intact    Lab Results: Basic Metabolic Panel:  Recent Labs  41/74/08 0510 02/02/16 0255  NA 134* 133*  K 4.1 3.8  CL 95* 94*  CO2 32 31  GLUCOSE 120* 110*  BUN 13 16  CREATININE 0.53* 0.66  CALCIUM 9.0 8.9   CBC:  Recent Labs  02/01/16 0510 02/02/16 0255  WBC 6.8 6.1  HGB 11.9* 11.2*  HCT 35.8* 33.3*  MCV 96.8 95.7  PLT 95* 127*     Assessment/Plan:  1 status post aortic valve replacement-patient doing well postoperatively. We'll arrange repeat echocardiogram approximately 3 months after discharge for baseline scan status post AVR. 2 Cardiomyopathy-hopefully LV function will improve following AVR. Continue carvedilol. We will try and add an ACE inhibitor later if blood pressure allows. LV function will be reassessed in 3 months with repeat echocardiogram. 3 nonsustained ventricular tachycardia-Continue beta blocker. 4 postoperative volume excess-continue Lasix at present dose. 5 postoperative atrial fibrillation-patient back in atrial fibrillation. Continue amiodarone but increase to  400 mg BID for 2 weeks; then 200 mg daily thereafter; increase coreg to 6.25 BID; watch BP; would begin coumadin if ok with CVTS.  6 severe COPD  Olga Millers 02/03/2016, 8:34 AM

## 2016-02-03 NOTE — Progress Notes (Signed)
Patient ambulated in hallway independently will monitor patient. Luevenia Mcavoy Jessup RN  

## 2016-02-03 NOTE — Progress Notes (Signed)
Patient has watched the coumadin video and has the coumadin book. Tionne Dayhoff, Randall An RN

## 2016-02-03 NOTE — Progress Notes (Addendum)
      [address]       [place] [address]             [phone]        5 Days Post-Op Procedure(s) (LRB): AORTIC VALVE REPLACEMENT (AVR) with Magna Ease size 60mm (N/A) TRANSESOPHAGEAL ECHOCARDIOGRAM (TEE) (N/A)  Subjective: Patient was hoping to go home today.  Objective: Vital signs in last 24 hours: Temp:  [98 F (36.7 C)-98.7 F (37.1 C)] 98.7 F (37.1 C) (08/05 0443) Pulse Rate:  [67-116] 116 (08/05 0759) Cardiac Rhythm: Atrial fibrillation (08/05 0752) Resp:  [20] 20 (08/05 0443) BP: (96-117)/(56-73) 101/70 (08/05 0759) SpO2:  [94 %-98 %] 94 % (08/05 0443) Weight:  [157 lb 12.8 oz (71.6 kg)] 157 lb 12.8 oz (71.6 kg) (08/05 0443)  Pre op weight 74 kg Current Weight  02/03/16 157 lb 12.8 oz (71.6 kg)       Intake/Output from previous day: 08/04 0701 - 08/05 0700 In: -  Out: 700 [Urine:700]   Physical Exam:  Cardiovascular: IRRR IRRR, no murmur Pulmonary: Slightly diminished at bases Abdomen: Soft, non tender, bowel sounds present. Extremities: No lower extremity edema. Wounds: Clean and dry.  No erythema or signs of infection.  Lab Results: CBC:  Recent Labs  02/01/16 0510 02/02/16 0255  WBC 6.8 6.1  HGB 11.9* 11.2*  HCT 35.8* 33.3*  PLT 95* 127*   BMET:   Recent Labs  02/01/16 0510 02/02/16 0255  NA 134* 133*  K 4.1 3.8  CL 95* 94*  CO2 32 31  GLUCOSE 120* 110*  BUN 13 16  CREATININE 0.53* 0.66  CALCIUM 9.0 8.9    PT/INR:  Lab Results  Component Value Date   INR 1.35 01/29/2016   INR 0.98 01/28/2016   INR 1.1 01/19/2016   ABG:  INR: Will add last result for INR, ABG once components are confirmed Will add last 4 CBG results once components are confirmed  Assessment/Plan:  1. CV - Previous intermittent a fib.In a fib with HR 90-110's this am.. On Coreg 3.125 mg bid and Amiodarone 200 mg bid.  Will increase Amiodarone and Coreg. He will need anticoagulation so will start Coumadin. Per cardiology, will  need echo in 3 months to assess LV function. 2.  Pulmonary - On room air. History of COPD. Continue current medication. Encourage incentive spirometer 3.  Acute blood loss anemia - H and H stable at 11.2 and 33.3 4. Mild thrombocytopenia-platelets up to 127,000 5. Urinary retention resolved-ContinueFlomax and already on Proscar. Voiding on own. 6. Volume overload-On Lasix 40 mg daily 7. Patient to be started on Coumadin for persistent a fib. Once able to determine dose, will discharge  ZIMMERMAN,DONIELLE MPA-C 02/03/2016,8:20 AM    I have seen and examined the patient and agree with the assessment and plan as outlined.  Back in persistent rate-controlled Afib.  Start warfarin and hold D/C  Purcell Nails, MD 02/03/2016 1:43 PM

## 2016-02-04 LAB — BASIC METABOLIC PANEL
ANION GAP: 7 (ref 5–15)
BUN: 18 mg/dL (ref 6–20)
CHLORIDE: 99 mmol/L — AB (ref 101–111)
CO2: 30 mmol/L (ref 22–32)
CREATININE: 0.68 mg/dL (ref 0.61–1.24)
Calcium: 9 mg/dL (ref 8.9–10.3)
GFR calc non Af Amer: >60 mL/min (ref >60)
Glucose, Bld: 110 mg/dL — ABNORMAL HIGH (ref 65–99)
POTASSIUM: 3.9 mmol/L (ref 3.5–5.1)
SODIUM: 136 mmol/L (ref 135–145)

## 2016-02-04 LAB — PROTIME-INR
INR: 1.06
Prothrombin Time: 13.9 seconds (ref 11.4–15.2)

## 2016-02-04 MED ORDER — POTASSIUM CHLORIDE CRYS ER 20 MEQ PO TBCR
20.0000 meq | EXTENDED_RELEASE_TABLET | Freq: Once | ORAL | Status: AC
  Administered 2016-02-04: 20 meq via ORAL
  Filled 2016-02-04: qty 1

## 2016-02-04 NOTE — Progress Notes (Signed)
Patient SBP consistently  In 90s this AM, per hold parameters Coreg not given this AM will monitor patient. Billyjoe Go, Randall An RN

## 2016-02-04 NOTE — Progress Notes (Signed)
Patient ambulated in hallway this AM independently, will monitor patient. Nirvan Laban, [employer]

## 2016-02-04 NOTE — Progress Notes (Signed)
    Subjective:  Denies CP or dyspnea; mild palpitations; no dizziness   Objective:  Vitals:   02/04/16 0433 02/04/16 0806 02/04/16 0811 02/04/16 0812  BP: 106/73  (!) 98/57 (!) 97/57  Pulse: 63     Resp: 20     Temp: 97.8 F (36.6 C)     TempSrc: Oral     SpO2: 96% 97%    Weight: 158 lb 12.8 oz (72 kg)     Height:        Intake/Output from previous day:  Intake/Output Summary (Last 24 hours) at 02/04/16 0826 Last data filed at 02/03/16 1517  Gross per 24 hour  Intake              240 ml  Output                0 ml  Net              240 ml    Physical Exam: Physical exam: Well-developed well-nourished in no acute distress.  Skin is warm and dry.  HEENT is normal.  Neck is supple.  Chest diminished BS LLL Cardiovascular exam is irregular Abdominal exam nontender or distended. No masses palpated. Extremities show no edema. neuro grossly intact    Lab Results: Basic Metabolic Panel:  Recent Labs  84/66/59 0255 02/04/16 0256  NA 133* 136  K 3.8 3.9  CL 94* 99*  CO2 31 30  GLUCOSE 110* 110*  BUN 16 18  CREATININE 0.66 0.68  CALCIUM 8.9 9.0   CBC:  Recent Labs  02/02/16 0255  WBC 6.1  HGB 11.2*  HCT 33.3*  MCV 95.7  PLT 127*     Assessment/Plan:  1 status post aortic valve replacement-patient doing well postoperatively. We will arrange repeat echocardiogram approximately 3 months after discharge for baseline scan status post AVR. 2 Cardiomyopathy-hopefully LV function will improve following AVR. Continue carvedilol. We will try and add an ACE inhibitor later as outpt if blood pressure allows. LV function will be reassessed in 3 months with repeat echocardiogram. 3 nonsustained ventricular tachycardia-no further episodes on telemetry; continue beta blocker. 4 postoperative volume excess-continue Lasix at present dose. 5 postoperative atrial fibrillation-patient remains in atrial fibrillation. Continue amiodarone 400 mg BID for 2 weeks; then 200  mg daily thereafter; continue coreg 6.25 BID; BP borderline and cannot advance; HR appears to be reasonably well controlled; continue coumadin; fu as outpt and if atrial fibrillation persists, could arrange DCCV. 6 severe COPD  Olga Millers 02/04/2016, 8:26 AM

## 2016-02-04 NOTE — Progress Notes (Addendum)
      [address]       [place] [address]             [phone]        6 Days Post-Op Procedure(s) (LRB): AORTIC VALVE REPLACEMENT (AVR) with Magna Ease size 6mm (N/A) TRANSESOPHAGEAL ECHOCARDIOGRAM (TEE) (N/A)  Subjective: Patient was hoping to go home today.  Objective: Vital signs in last 24 hours: Temp:  [97.5 F (36.4 C)-97.9 F (36.6 C)] 97.8 F (36.6 C) (08/06 0433) Pulse Rate:  [63-95] 63 (08/06 0433) Cardiac Rhythm: Atrial fibrillation (08/06 0812) Resp:  [18-20] 20 (08/06 0433) BP: (86-106)/(50-73) 97/57 (08/06 0812) SpO2:  [96 %-99 %] 97 % (08/06 0806) Weight:  [158 lb 12.8 oz (72 kg)] 158 lb 12.8 oz (72 kg) (08/06 0433)  Pre op weight 74 kg Current Weight  02/04/16 158 lb 12.8 oz (72 kg)       Intake/Output from previous day: 08/05 0701 - 08/06 0700 In: 240 [P.O.:240] Out: -    Physical Exam:  Cardiovascular: IRRR IRRR, no murmur Pulmonary: Slightly diminished at bases Abdomen: Soft, non tender, bowel sounds present. Extremities: No lower extremity edema. Wounds: Clean and dry.  No erythema or signs of infection.  Lab Results: CBC:  Recent Labs  02/02/16 0255  WBC 6.1  HGB 11.2*  HCT 33.3*  PLT 127*   BMET:   Recent Labs  02/02/16 0255 02/04/16 0256  NA 133* 136  K 3.8 3.9  CL 94* 99*  CO2 31 30  GLUCOSE 110* 110*  BUN 16 18  CREATININE 0.66 0.68  CALCIUM 8.9 9.0    PT/INR:  Lab Results  Component Value Date   INR 1.06 02/04/2016   INR 1.35 01/29/2016   INR 0.98 01/28/2016   ABG:  INR: Will add last result for INR, ABG once components are confirmed Will add last 4 CBG results once components are confirmed  Assessment/Plan:  1. CV -A fib/fl with controlled rate. On Coreg 6.25 mg bid, Amiodarone 400 mg bid, and Coumadin. INR 1.06. Continue current Coumadin dose.Per cardiology, will need echo in 3 months to assess LV function. 2.  Pulmonary - On room air. History of COPD. Continue current  medication. Encourage incentive spirometer 3.  Acute blood loss anemia - Last H and H stable at 11.2 and 33.3 4. Mild thrombocytopenia-platelets up to 127,000 5. Urinary retention resolved-ContinueFlomax and already on Proscar. Voiding on own. 6. Volume overload-On Lasix 40 mg daily 7. Supplement potassium 8. Possibly discharge in am,once know Coumadin dose  ZIMMERMAN,DONIELLE MPA-C 02/04/2016,8:40 AM   8:40 AM   I have seen and examined the patient and agree with the assessment and plan as outlined.  Purcell Nails, MD 02/04/2016 10:53 AM

## 2016-02-04 NOTE — Progress Notes (Signed)
Patient ambulated in hallway independently will monitor patient. Lori Liew Jessup RN  

## 2016-02-05 LAB — PROTIME-INR
INR: 1.05
Prothrombin Time: 13.7 seconds (ref 11.4–15.2)

## 2016-02-05 MED ORDER — CARVEDILOL 3.125 MG PO TABS: 3.1250 mg | ORAL_TABLET | Freq: Two times a day (BID) | ORAL | 1 refills | Status: DC

## 2016-02-05 MED ORDER — AMIODARONE HCL 200 MG PO TABS: 200.0000 mg | ORAL_TABLET | Freq: Two times a day (BID) | ORAL | 1 refills | Status: DC

## 2016-02-05 MED ORDER — OXYCODONE HCL 5 MG PO TABS: 5.0000 mg | ORAL_TABLET | ORAL | 0 refills | Status: DC | PRN

## 2016-02-05 MED ORDER — ASPIRIN 81 MG PO TABS: 81.0000 mg | ORAL_TABLET | Freq: Every day | ORAL | Status: DC

## 2016-02-05 MED ORDER — CARVEDILOL 3.125 MG PO TABS
3.1250 mg | ORAL_TABLET | Freq: Two times a day (BID) | ORAL | Status: DC
  Administered 2016-02-05: 3.125 mg via ORAL
  Filled 2016-02-05: qty 1

## 2016-02-05 MED ORDER — WARFARIN SODIUM 7.5 MG PO TABS: 7.5000 mg | ORAL_TABLET | Freq: Once | ORAL | Status: DC

## 2016-02-05 MED ORDER — WARFARIN SODIUM 5 MG PO TABS: 5.0000 mg | ORAL_TABLET | Freq: Once | ORAL | 1 refills | Status: DC

## 2016-02-05 MED ORDER — POTASSIUM CHLORIDE CRYS ER 20 MEQ PO TBCR: 20.0000 meq | EXTENDED_RELEASE_TABLET | Freq: Every day | ORAL | 0 refills | Status: DC

## 2016-02-05 MED ORDER — FUROSEMIDE 40 MG PO TABS: 40.0000 mg | ORAL_TABLET | Freq: Every day | ORAL | 0 refills | Status: DC

## 2016-02-05 NOTE — Progress Notes (Addendum)
      [address]       [name] [address]             [phone]        7 Days Post-Op Procedure(s) (LRB): AORTIC VALVE REPLACEMENT (AVR) with Magna Ease size 80mm (N/A) TRANSESOPHAGEAL ECHOCARDIOGRAM (TEE) (N/A)  Subjective: Patient is hoping to go home today.  Objective: Vital signs in last 24 hours: Temp:  [98.4 F (36.9 C)-98.7 F (37.1 C)] 98.7 F (37.1 C) (08/07 0322) Pulse Rate:  [81-101] 81 (08/07 0322) Cardiac Rhythm: Atrial fibrillation (08/06 1902) Resp:  [19-20] 20 (08/07 0322) BP: (78-115)/(42-100) 115/76 (08/07 0322) SpO2:  [95 %-97 %] 95 % (08/07 0322) Weight:  [157 lb 6.4 oz (71.4 kg)] 157 lb 6.4 oz (71.4 kg) (08/07 0322)  Pre op weight 74 kg Current Weight  02/05/16 157 lb 6.4 oz (71.4 kg)       Intake/Output from previous day: 08/06 0701 - 08/07 0700 In: 360 [P.O.:360] Out: -    Physical Exam:  Cardiovascular: IRRR IRRR, no murmur Pulmonary: Slightly diminished at bases Abdomen: Soft, non tender, bowel sounds present. Extremities: No lower extremity edema. Wounds: Clean and dry.  No erythema or signs of infection.  Lab Results: CBC: No results for input(s): WBC, HGB, HCT, PLT in the last 72 hours. BMET:   Recent Labs  02/04/16 0256  NA 136  K 3.9  CL 99*  CO2 30  GLUCOSE 110*  BUN 18  CREATININE 0.68  CALCIUM 9.0    PT/INR:  Lab Results  Component Value Date   INR 1.05 02/05/2016   INR 1.06 02/04/2016   INR 1.35 01/29/2016   ABG:  INR: Will add last result for INR, ABG once components are confirmed Will add last 4 CBG results once components are confirmed  Assessment/Plan:  1. CV -A fib/fl with controlled rate. On Coreg 6.25 mg bid, Amiodarone 400 mg bid, and Coumadin. INR 1.05. SBP mostly low 100's and 90's this am. Will decrease Coreg to 3.125 mg bid (as not consistently getting 6.25 mg) and decrease Amiodarone.Will give Coumadin 7.5 mg tonight and send home on 5 mg..Per cardiology, will need  echo in 3 months to assess LV function. 2.  Pulmonary - On room air. History of COPD. Continue current medication. Encourage incentive spirometer 3.  Acute blood loss anemia - Last H and H stable at 11.2 and 33.3 4. Mild thrombocytopenia-platelets up to 127,000 5. Urinary retention resolved-ContinueFlomax and already on Proscar. Voiding on own. 6. Volume overload-On Lasix 40 mg daily. Per Dr. Donata Clay, daily Lasix to continue at discharge (low EF) 7. Hope to discharge later today  ZIMMERMAN,DONIELLE MPA-C 02/05/2016,7:29 AM   7:29 AM   Patient seen and examined, agree with above I would prefer to keep until INR bumps, but he is anxious to go home and understands the risks associated with a fib/ flutter with regards to stroke. Will need close outpatient follow up.  Salvatore Decent Dorris Fetch, MD [facility] [phone]

## 2016-02-05 NOTE — Progress Notes (Signed)
Patient given discharge instructions, medication list, and paper prescriptions. Follow up appointments reviewed and listed on discharge papers. IV was dcd and telemetry box removed. Will discharge home as ordered. Lilyanah Celestin, Randall An RN

## 2016-02-07 ENCOUNTER — Encounter (INDEPENDENT_AMBULATORY_CARE_PROVIDER_SITE_OTHER): Payer: Self-pay

## 2016-02-07 ENCOUNTER — Ambulatory Visit (INDEPENDENT_AMBULATORY_CARE_PROVIDER_SITE_OTHER): Payer: BLUE CROSS/BLUE SHIELD | Admitting: Pharmacist Clinician (PhC)/ Clinical Pharmacy Specialist

## 2016-02-07 DIAGNOSIS — I480 Paroxysmal atrial fibrillation: Secondary | ICD-10-CM | POA: Diagnosis not present

## 2016-02-07 DIAGNOSIS — Z7901 Long term (current) use of anticoagulants: Secondary | ICD-10-CM | POA: Diagnosis not present

## 2016-02-07 LAB — POCT INR: INR: 1.1

## 2016-02-07 NOTE — Progress Notes (Signed)

## 2016-02-12 ENCOUNTER — Ambulatory Visit (INDEPENDENT_AMBULATORY_CARE_PROVIDER_SITE_OTHER): Payer: BLUE CROSS/BLUE SHIELD | Admitting: Pharmacist

## 2016-02-12 ENCOUNTER — Telehealth: Payer: Self-pay | Admitting: Internal Medicine

## 2016-02-12 DIAGNOSIS — Z7901 Long term (current) use of anticoagulants: Secondary | ICD-10-CM

## 2016-02-12 DIAGNOSIS — I480 Paroxysmal atrial fibrillation: Secondary | ICD-10-CM

## 2016-02-12 LAB — POCT INR: INR: 1.9

## 2016-02-12 NOTE — Telephone Encounter (Signed)
Noted.   Routed to MD as FYI.

## 2016-02-12 NOTE — Telephone Encounter (Signed)
New message         Gardiner Ramus called to inform you that pt is now enrolled in a case management program. If you have any questions please call.

## 2016-02-20 ENCOUNTER — Ambulatory Visit (INDEPENDENT_AMBULATORY_CARE_PROVIDER_SITE_OTHER): Payer: BLUE CROSS/BLUE SHIELD | Admitting: Pharmacist Clinician (PhC)/ Clinical Pharmacy Specialist

## 2016-02-20 ENCOUNTER — Encounter: Payer: Self-pay | Admitting: Internal Medicine

## 2016-02-20 ENCOUNTER — Ambulatory Visit (INDEPENDENT_AMBULATORY_CARE_PROVIDER_SITE_OTHER): Payer: BLUE CROSS/BLUE SHIELD | Admitting: Internal Medicine

## 2016-02-20 VITALS — BP 100/62 | HR 82 | Ht 69.0 in | Wt 166.6 lb

## 2016-02-20 DIAGNOSIS — Z7901 Long term (current) use of anticoagulants: Secondary | ICD-10-CM | POA: Diagnosis not present

## 2016-02-20 DIAGNOSIS — I429 Cardiomyopathy, unspecified: Secondary | ICD-10-CM

## 2016-02-20 DIAGNOSIS — I5022 Chronic systolic (congestive) heart failure: Secondary | ICD-10-CM

## 2016-02-20 DIAGNOSIS — I483 Typical atrial flutter: Secondary | ICD-10-CM | POA: Diagnosis not present

## 2016-02-20 DIAGNOSIS — Z954 Presence of other heart-valve replacement: Secondary | ICD-10-CM | POA: Diagnosis not present

## 2016-02-20 DIAGNOSIS — I480 Paroxysmal atrial fibrillation: Secondary | ICD-10-CM | POA: Diagnosis not present

## 2016-02-20 DIAGNOSIS — Z952 Presence of prosthetic heart valve: Secondary | ICD-10-CM

## 2016-02-20 LAB — POCT INR: INR: 2.5

## 2016-02-20 MED ORDER — FINASTERIDE 5 MG PO TABS: 5.0000 mg | ORAL_TABLET | Freq: Every day | ORAL | 1 refills | Status: AC

## 2016-02-20 NOTE — Patient Instructions (Addendum)
Your physician recommends that you schedule a follow-up appointment in 3-4 weeks with Dr. Rennis Golden.  Dr. Rennis Golden has refilled your finasteride. Please obtain future refills from PCP.

## 2016-02-20 NOTE — Progress Notes (Signed)
OFFICE NOTE  Chief Complaint:  Hospital follow-up  Primary Care Physician: Abner Greenspan, MD  HPI:  [name] is a 60 y.o. male he was kindly referred to me for evaluation of new onset shortness of breath. He saw his primary care provider who had been following him for an abnormal cardiac murmur in the past. In fact [name] had previously seen a cardiologist in [place], probably more than 5 years ago who did an echocardiogram and then she had valvular heart disease which could get worse over time. He had not followed up with his cardiologist. On presentation last month to his primary care provider he was notably short of breath and underwent a chest x-ray which showed some mild pulmonary edema as well as COPD changes. He underwent office spirometry which indicated obstruction and possible restriction with some bronchodilator reversibility and an FEV1 less than 80% predicted. Subsequently he was referred to an echocardiogram which was performed at [facility]. I reviewed the interpretation which indicates mild LV dilatation and mild global hypokinesis with an EF of 45-50%. There was marketed dilatation of the left atrium, mild aortic insufficiency and severe aortic stenosis. The peak and mean gradients across the valve or 71 mmHg and 50 mmHg, respectively with a calculated aortic valve area of 0.62 cm. There was also mild to moderate mitral regurgitation. The study was performed on 12/01/2015. Over the past month [name] is become somewhat more short of breath, but is still able to do physical activities. He denies any chest pain. Family history indicates death by natural causes of family members but no known coronary disease.  02/20/2016  [name] today returns today for hospital follow-up. He underwent fairly urgent cardiac catheterization by myself, the results of which were as follows:   Mid RCA lesion, 40 %stenosed.  Prox RCA lesion, 10 %stenosed.  Hemodynamic findings  consistent with moderate pulmonary hypertension, mitral valve regurgitation and aortic valve stenosis.   Non-obstructive CAD. Mildly reduced cardiac index. PA 58%, PCWP 28,  Mean PA 45. BNP was 1100 and CXR shows pulmonary edema. A repeat echocardiogram showed that EF has decreased even lower to 20-25%. He was admitted for diuresis and CT surgery was consulted for aortic valve replacement. After diuresis he underwent aortic valve replacement with a 25 mm pericardial Edwards magna ease valve on 01/29/2016, at which time he was found to have severe bicuspid aortic valve stenosis and insufficiency. He also had significant bilateral pleural effusions which were drained with chest tubes. Recovery was uneventful except that he developed atrial fibrillation on or about 02/01/2016. He was started on oral amiodarone and warfarin. After discharge he is INR remains subtherapeutic, but recently had increased to 1.9. A repeat INR checked today was 2.5. An EKG in the office today demonstrates atrial flutter with variable A-V response at 82. There is LVH by voltage. [name] reports reports feeling much better. He is not symptomatic with his atrial flutter. He is about to start cardiac rehabilitation at [facility].  PMHx:  Past Medical History:  Diagnosis Date  . CAD (coronary artery disease) 01/23/2016   minor CAD at cath-40% RCA  . COPD (chronic obstructive pulmonary disease) (HCC)   . Non-ischemic cardiomyopathy (HCC) 01/23/2016   EF 30-35%  . Severe aortic stenosis   . SOBOE (shortness of breath on exertion)     Past Surgical History:  Procedure Laterality Date  . AORTIC VALVE REPLACEMENT N/A 01/29/2016   Procedure: AORTIC VALVE REPLACEMENT (AVR) with Magna Ease size  25mm;  Surgeon: Kerin PernaPeter Van Trigt, MD;  [facility];  Service: Open Heart Surgery;  Laterality: N/A;  . CARDIAC CATHETERIZATION N/A 01/23/2016   Procedure: Right/Left Heart Cath and Coronary Angiography;  Surgeon: Chrystie NoseKenneth C Adonay Scheier, MD;   [facility];  Service: Cardiovascular;  Laterality: N/A;  . CATARACT EXTRACTION  12/2002 & 09/2007  . SHOULDER SURGERY Right 1971  . TEE WITHOUT CARDIOVERSION N/A 01/29/2016   Procedure: TRANSESOPHAGEAL ECHOCARDIOGRAM (TEE);  Surgeon: Kerin PernaPeter Van Trigt, MD;  [facility];  Service: Open Heart Surgery;  Laterality: N/A;    FAMHx:  Family History  Problem Relation Age of Onset  . Breast cancer Mother   . Breast cancer Sister     SOCHx:   reports that he has quit smoking. His smoking use included Cigarettes. He has a 45.00 pack-year smoking history. He uses smokeless tobacco. He reports that he drinks about 7.2 oz of alcohol per week . He reports that he does not use drugs.  ALLERGIES:  Allergies  Allergen Reactions  . No Known Allergies     ROS: Pertinent items noted in HPI and remainder of comprehensive ROS otherwise negative.  HOME MEDS: Current Outpatient Prescriptions  Medication Sig Dispense Refill  . albuterol (PROVENTIL) (5 MG/ML) 0.5% nebulizer solution Take 2.5 mg by nebulization 2 (two) times daily as needed for wheezing or shortness of breath.    Marland Kitchen. amiodarone (PACERONE) 200 MG tablet Take 1 tablet (200 mg total) by mouth 2 (two) times daily. For one week;then take Amiodarone 200 mg by mouth daily thereafter 60 tablet 1  . aspirin 81 MG tablet Take 1 tablet (81 mg total) by mouth daily.    Marland Kitchen. BREO ELLIPTA 100-25 MCG/INH AEPB USE 1 INHALATION DAILY  0  . carvedilol (COREG) 3.125 MG tablet Take 1 tablet (3.125 mg total) by mouth 2 (two) times daily with a meal. 60 tablet 1  . finasteride (PROSCAR) 5 MG tablet Take 1 tablet (5 mg total) by mouth daily. 30 tablet 1  . furosemide (LASIX) 40 MG tablet Take 1 tablet (40 mg total) by mouth daily. 30 tablet 0  . INCRUSE ELLIPTA 62.5 MCG/INH AEPB Take 1 puff by mouth daily.  1  . oxyCODONE (OXY IR/ROXICODONE) 5 MG immediate release tablet Take 1 tablet (5 mg total) by mouth every 4 (four) hours as needed for severe  pain. 30 tablet 0  . potassium chloride SA (K-DUR,KLOR-CON) 20 MEQ tablet Take 1 tablet (20 mEq total) by mouth daily. 30 tablet 0  . tamsulosin (FLOMAX) 0.4 MG CAPS capsule Take 0.4 mg by mouth daily.  1  . warfarin (COUMADIN) 5 MG tablet Take 1 tablet (5 mg total) by mouth one time only at 6 PM. 30 tablet 1   No current facility-administered medications for this visit.     LABS/IMAGING: No results found for this or any previous visit (from the past 48 hour(s)). No results found.  WEIGHTS: Wt Readings from Last 3 Encounters:  02/20/16 166 lb 9.6 oz (75.6 kg)  02/05/16 157 lb 6.4 oz (71.4 kg)  01/19/16 168 lb 9.6 oz (76.5 kg)    VITALS: BP 100/62 (BP Location: Right Arm, Patient Position: Sitting, Cuff Size: Normal)   Pulse 82   Ht 5\' 9"  (1.753 m)   Wt 166 lb 9.6 oz (75.6 kg)   BMI 24.60 kg/m   EXAM: General appearance: alert and no distress Neck: no carotid bruit and no JVD Lungs: clear to auscultation bilaterally Heart: regular  rate and rhythm, S1, S2 normal, no murmur, click, rub or gallop Abdomen: soft, non-tender; bowel sounds normal; no masses,  no organomegaly Extremities: extremities normal, atraumatic, no cyanosis or edema Pulses: 2+ and symmetric Skin: Skin color, texture, turgor normal. No rashes or lesions Neurologic: Grossly normal Psych: Pleasant  EKG: Atrial flutter with variable A-V response at 82, incomplete left bundle branch block and LVH by voltage  ASSESSMENT: 1. Severe symptomatic bicuspid calcific aortic stenosis - s/p 25 mm Edwards MagnaEase tissue valve (12/2015) 2. Mild to moderate mitral regurgitation with "marked" left atrial enlargement 3. COPD and ongoing tobacco abuse 4. Dilated cardiomyopathy with EF 45-50% -> reduced to 20-25% perioperatively 5. Persistent postoperative atrial flutter-CHADSVASC score of 3 (on warfarin)  PLAN: 1.   Mr. Sabori is recovering well from recent aortic valve surgery. LV EF had dropped significantly to 20-25%  perioperatively from 45-50% about one month earlier. This is likely due to LV failure in the setting of severe fixed afterload given his aortic stenosis. I suspect LV function has probably started to recover although he did go into A. fib/atrial flutter and remains in atrial flutter with rate control currently. Given his CHADSVASC score 3 he is on warfarin. We talked about reestablishing sinus rhythm which I would recommend after one month of consecutive therapeutic INR is on a weekly basis. Today his INR is therapeutic and therefore will continue to check it weekly with the plan of cardioversion next month. I would like to leave him on Lasix daily for now although he appears euvolemic. We will reassess LV function and probably 1-2 months. He has follow-up with Dr. Maren Beach in about 2 weeks.  Follow-up with me in one month.  Chrystie Nose, MD, [facility] Attending Cardiologist [facility]  Chrystie Nose 02/20/2016, 12:11 PM

## 2016-02-27 ENCOUNTER — Other Ambulatory Visit: Payer: Self-pay | Admitting: Cardiothoracic Surgery

## 2016-02-27 ENCOUNTER — Telehealth: Payer: Self-pay | Admitting: Internal Medicine

## 2016-02-27 DIAGNOSIS — Z952 Presence of prosthetic heart valve: Secondary | ICD-10-CM

## 2016-02-27 NOTE — Telephone Encounter (Signed)
Faxed Cardiac Rehab Referral Form signed by Dr Rennis Golden to  [facility].  lp

## 2016-02-28 ENCOUNTER — Ambulatory Visit
Admission: RE | Admit: 2016-02-28 | Discharge: 2016-02-28 | Disposition: A | Discharge status: Home / Self Care | Payer: BLUE CROSS/BLUE SHIELD | Source: Ambulatory Visit | Attending: Cardiothoracic Surgery | Admitting: Cardiothoracic Surgery

## 2016-02-28 ENCOUNTER — Ambulatory Visit (INDEPENDENT_AMBULATORY_CARE_PROVIDER_SITE_OTHER): Payer: Self-pay | Admitting: Cardiothoracic Surgery

## 2016-02-28 VITALS — BP 119/74 | HR 64 | Resp 20 | Ht 69.0 in | Wt 166.0 lb

## 2016-02-28 DIAGNOSIS — Z952 Presence of prosthetic heart valve: Secondary | ICD-10-CM

## 2016-02-28 DIAGNOSIS — Z954 Presence of other heart-valve replacement: Secondary | ICD-10-CM

## 2016-02-28 NOTE — Progress Notes (Signed)
PCP is HODGES,BETH, MD Referring Provider is Rennis Golden Lisette Abu, MD  Chief Complaint  Patient presents with  . Routine Post Op    f/u from surgery with CXR s/p AVR 01/29/16  Patient returns for one month follow-up after aVR  HPI: Very nice 60 year old gentleman reformed smoker presented with advanced heart failure with bilateral pleural effusions and low ejection fraction from severe aortic stenosis. He underwent aortic valve replacement with a biologic pericardial tissue valve. Ejection fraction is around 30%. Symptomatically he is significantly improved since aVR. The patient indicates he can sleep now supine and denies PND. Surgical incision is well-healed. He is walking daily. He has started cardiac rehabilitation.  The patient has had postoperative atrial fibrillation-flutter and remains on Coumadin. Outpatient cardioversion has been recommended by Dr. Rennis Golden if the patient does not convert to sinus rhythm on amiodarone.  Chest x-ray taken today shows no pleural effusions and stable cardiac silhouette.  Past Medical History:  Diagnosis Date  . CAD (coronary artery disease) 01/23/2016   minor CAD at cath-40% RCA  . COPD (chronic obstructive pulmonary disease) (HCC)   . Non-ischemic cardiomyopathy (HCC) 01/23/2016   EF 30-35%  . Severe aortic stenosis   . SOBOE (shortness of breath on exertion)     Past Surgical History:  Procedure Laterality Date  . AORTIC VALVE REPLACEMENT N/A 01/29/2016   Procedure: AORTIC VALVE REPLACEMENT (AVR) with Magna Ease size 56mm;  Surgeon: [name], MD;  Location: [facility] OR;  Service: Open Heart Surgery;  Laterality: N/A;  . CARDIAC CATHETERIZATION N/A 01/23/2016   Procedure: Right/Left Heart Cath and Coronary Angiography;  Surgeon: Chrystie Nose, MD;  Location: [facility] INVASIVE CV LAB;  Service: Cardiovascular;  Laterality: N/A;  . CATARACT EXTRACTION  12/2002 & 09/2007  . SHOULDER SURGERY Right 1971  . TEE WITHOUT CARDIOVERSION N/A 01/29/2016   Procedure:  TRANSESOPHAGEAL ECHOCARDIOGRAM (TEE);  Surgeon: [name], MD;  Location: [facility] OR;  Service: Open Heart Surgery;  Laterality: N/A;    Family History  Problem Relation Age of Onset  . Breast cancer Mother   . Breast cancer Sister     Social History Social History  Substance Use Topics  . Smoking status: Former Smoker    Packs/day: 1.00    Years: 45.00    Types: Cigarettes  . Smokeless tobacco: Current User  . Alcohol use 7.2 oz/week    12 Standard drinks or equivalent per week     Comment: beer    Current Outpatient Prescriptions  Medication Sig Dispense Refill  . albuterol (PROVENTIL) (5 MG/ML) 0.5% nebulizer solution Take 2.5 mg by nebulization 2 (two) times daily as needed for wheezing or shortness of breath.    Marland Kitchen amiodarone (PACERONE) 200 MG tablet Take 1 tablet (200 mg total) by mouth 2 (two) times daily. For one week;then take Amiodarone 200 mg by mouth daily thereafter 60 tablet 1  . aspirin 81 MG tablet Take 1 tablet (81 mg total) by mouth daily.    Marland Kitchen BREO ELLIPTA 100-25 MCG/INH AEPB USE 1 INHALATION DAILY  0  . carvedilol (COREG) 3.125 MG tablet Take 1 tablet (3.125 mg total) by mouth 2 (two) times daily with a meal. 60 tablet 1  . finasteride (PROSCAR) 5 MG tablet Take 1 tablet (5 mg total) by mouth daily. 30 tablet 1  . furosemide (LASIX) 40 MG tablet Take 1 tablet (40 mg total) by mouth daily. 30 tablet 0  . INCRUSE ELLIPTA 62.5 MCG/INH AEPB Take 1 puff by mouth  daily.  1  . oxyCODONE (OXY IR/ROXICODONE) 5 MG immediate release tablet Take 1 tablet (5 mg total) by mouth every 4 (four) hours as needed for severe pain. 30 tablet 0  . potassium chloride SA (K-DUR,KLOR-CON) 20 MEQ tablet Take 1 tablet (20 mEq total) by mouth daily. 30 tablet 0  . tamsulosin (FLOMAX) 0.4 MG CAPS capsule Take 0.4 mg by mouth daily.  1  . warfarin (COUMADIN) 5 MG tablet Take 1 tablet (5 mg total) by mouth one time only at 6 PM. 30 tablet 1   No current facility-administered medications for  this visit.     Allergies  Allergen Reactions  . No Known Allergies     Review of Systems   Improving appetite and strength Walking 30 minutes daily No ankle edema No fever No cough  BP 119/74 (BP Location: Left Arm, Patient Position: Sitting, Cuff Size: Normal)   Pulse 64   Resp 20   Ht 5\' 9"  (1.753 m)   Wt 166 lb (75.3 kg)   SpO2 98% Comment: RA  BMI 24.51 kg/m  Physical Exam      Exam    Sternal incision stable and well-healed   General- alert and comfortable   Lungs- clear without rales, wheezes   Cor- irregular heart rate rate and rhythm, no murmur , gallop   Abdomen- soft, non-tender   Extremities - warm, non-tender, minimal edema   Neuro- oriented, appropriate, no focal weakness   Diagnostic Tests: Chest x-ray clear  Impression: Excellent early recovery after aVR for severe AS  with severe LV dysfunction and heart failure Postoperative atrial fibrillation on Coumadin  Plan: Patient can resume driving and normal activity levels but lifting limit is 20 pounds or less until 3 months after surgery. He knows to continue aspirin 81 mg daily. Hopefully after he regains sinus rhythm Coumadin can be stopped later this year. Patient return here as needed. Mikey BussingPeter Van Trigt III, MD [facility] [phone]

## 2016-02-29 ENCOUNTER — Ambulatory Visit (INDEPENDENT_AMBULATORY_CARE_PROVIDER_SITE_OTHER): Payer: BLUE CROSS/BLUE SHIELD | Admitting: Pharmacist

## 2016-02-29 DIAGNOSIS — Z7901 Long term (current) use of anticoagulants: Secondary | ICD-10-CM | POA: Diagnosis not present

## 2016-02-29 DIAGNOSIS — I480 Paroxysmal atrial fibrillation: Secondary | ICD-10-CM | POA: Diagnosis not present

## 2016-02-29 LAB — POCT INR: INR: 2.7

## 2016-02-29 MED ORDER — WARFARIN SODIUM 5 MG PO TABS: ORAL_TABLET | ORAL | 1 refills | Status: DC

## 2016-02-29 MED ORDER — POTASSIUM CHLORIDE CRYS ER 20 MEQ PO TBCR: 20.0000 meq | EXTENDED_RELEASE_TABLET | Freq: Every day | ORAL | 1 refills | Status: DC

## 2016-02-29 MED ORDER — FUROSEMIDE 40 MG PO TABS: 40.0000 mg | ORAL_TABLET | Freq: Every day | ORAL | 1 refills | Status: DC

## 2016-03-01 ENCOUNTER — Other Ambulatory Visit: Payer: Self-pay | Admitting: Physician Assistant

## 2016-03-02 ENCOUNTER — Other Ambulatory Visit: Payer: Self-pay | Admitting: Physician Assistant

## 2016-03-05 ENCOUNTER — Other Ambulatory Visit: Payer: Self-pay | Admitting: *Deleted

## 2016-03-05 MED ORDER — CARVEDILOL 3.125 MG PO TABS: 3.1250 mg | ORAL_TABLET | Freq: Two times a day (BID) | ORAL | 3 refills | Status: DC

## 2016-03-05 MED ORDER — CARVEDILOL 3.125 MG PO TABS
3.1250 mg | ORAL_TABLET | Freq: Two times a day (BID) | ORAL | 3 refills | Status: DC
Start: 2016-03-05 — End: 2017-04-04

## 2016-03-05 MED ORDER — FUROSEMIDE 40 MG PO TABS: 40.0000 mg | ORAL_TABLET | Freq: Every day | ORAL | 3 refills | Status: DC

## 2016-03-05 MED ORDER — POTASSIUM CHLORIDE CRYS ER 20 MEQ PO TBCR: 20.0000 meq | EXTENDED_RELEASE_TABLET | Freq: Every day | ORAL | 3 refills | Status: DC

## 2016-03-05 MED ORDER — AMIODARONE HCL 200 MG PO TABS: 200.0000 mg | ORAL_TABLET | Freq: Every day | ORAL | 3 refills | Status: DC

## 2016-03-07 ENCOUNTER — Ambulatory Visit (INDEPENDENT_AMBULATORY_CARE_PROVIDER_SITE_OTHER): Payer: BLUE CROSS/BLUE SHIELD | Admitting: Pharmacist

## 2016-03-07 DIAGNOSIS — I480 Paroxysmal atrial fibrillation: Secondary | ICD-10-CM

## 2016-03-07 DIAGNOSIS — Z7901 Long term (current) use of anticoagulants: Secondary | ICD-10-CM | POA: Diagnosis not present

## 2016-03-07 LAB — POCT INR: INR: 2.7

## 2016-03-07 NOTE — Progress Notes (Signed)
That's fine .Marland Kitchen Lets wait until then.  Dr. Rexene Edison

## 2016-03-14 ENCOUNTER — Ambulatory Visit (INDEPENDENT_AMBULATORY_CARE_PROVIDER_SITE_OTHER): Payer: BLUE CROSS/BLUE SHIELD | Admitting: Pharmacist Clinician (PhC)/ Clinical Pharmacy Specialist

## 2016-03-14 DIAGNOSIS — I480 Paroxysmal atrial fibrillation: Secondary | ICD-10-CM

## 2016-03-14 DIAGNOSIS — Z7901 Long term (current) use of anticoagulants: Secondary | ICD-10-CM

## 2016-03-14 LAB — POCT INR: INR: 2.2

## 2016-03-22 ENCOUNTER — Ambulatory Visit (INDEPENDENT_AMBULATORY_CARE_PROVIDER_SITE_OTHER): Payer: BLUE CROSS/BLUE SHIELD | Admitting: Internal Medicine

## 2016-03-22 ENCOUNTER — Encounter: Payer: Self-pay | Admitting: Internal Medicine

## 2016-03-22 ENCOUNTER — Ambulatory Visit (INDEPENDENT_AMBULATORY_CARE_PROVIDER_SITE_OTHER): Payer: BLUE CROSS/BLUE SHIELD | Admitting: Pharmacist

## 2016-03-22 VITALS — BP 110/66 | HR 89 | Ht 69.0 in | Wt 178.4 lb

## 2016-03-22 DIAGNOSIS — Z7901 Long term (current) use of anticoagulants: Secondary | ICD-10-CM | POA: Diagnosis not present

## 2016-03-22 DIAGNOSIS — I480 Paroxysmal atrial fibrillation: Secondary | ICD-10-CM

## 2016-03-22 DIAGNOSIS — R5383 Other fatigue: Secondary | ICD-10-CM | POA: Diagnosis not present

## 2016-03-22 DIAGNOSIS — Z954 Presence of other heart-valve replacement: Secondary | ICD-10-CM

## 2016-03-22 DIAGNOSIS — I483 Typical atrial flutter: Secondary | ICD-10-CM

## 2016-03-22 DIAGNOSIS — Z952 Presence of prosthetic heart valve: Secondary | ICD-10-CM

## 2016-03-22 DIAGNOSIS — Z01818 Encounter for other preprocedural examination: Secondary | ICD-10-CM

## 2016-03-22 DIAGNOSIS — D689 Coagulation defect, unspecified: Secondary | ICD-10-CM

## 2016-03-22 LAB — BASIC METABOLIC PANEL
BUN: 22 mg/dL (ref 7–25)
CALCIUM: 9.2 mg/dL (ref 8.6–10.3)
CHLORIDE: 101 mmol/L (ref 98–110)
CO2: 28 mmol/L (ref 20–31)
Creat: 0.94 mg/dL (ref 0.70–1.25)
GLUCOSE: 117 mg/dL — AB (ref 65–99)
POTASSIUM: 4.4 mmol/L (ref 3.5–5.3)
SODIUM: 140 mmol/L (ref 135–146)

## 2016-03-22 LAB — CBC
HCT: 43.1 % (ref 38.5–50.0)
HEMOGLOBIN: 14.3 g/dL (ref 13.2–17.1)
MCH: 31.2 pg (ref 27.0–33.0)
MCHC: 33.2 g/dL (ref 32.0–36.0)
MCV: 93.9 fL (ref 80.0–100.0)
MPV: 10.3 fL (ref 7.5–12.5)
PLATELETS: 216 10*3/uL (ref 140–400)
RBC: 4.59 MIL/uL (ref 4.20–5.80)
RDW: 14.3 % (ref 11.0–15.0)
WBC: 6.4 10*3/uL (ref 3.8–10.8)

## 2016-03-22 LAB — TSH: TSH: 7.64 mIU/L — ABNORMAL HIGH (ref 0.40–4.50)

## 2016-03-22 LAB — POCT INR: INR: 2.9

## 2016-03-22 NOTE — Patient Instructions (Addendum)
Medication Instructions:  Your physician recommends that you continue on your current medications as directed. Please refer to the Current Medication list given to you today.  Labwork: Your physician recommends that you return for lab work in: 5-7 DAYS BEFORE CARDIOVERSION.  Testing/Procedures: Your physician has recommended that you have a Cardioversion (DCCV). Electrical Cardioversion uses a jolt of electricity to your heart either through paddles or wired patches attached to your chest. This is a controlled, usually prescheduled, procedure. Defibrillation is done under light anesthesia in the hospital, and you usually go home the day of the procedure. This is done to get your heart back into a normal rhythm. You are not awake for the procedure. Please see the instruction sheet given to you today.  Follow-Up: Your physician recommends that you schedule a follow-up appointment in: AFTER CARDIOVERION.  Any Other Special Instructions Will Be Listed Below (If Applicable).  NEXT APPT  DATE:______________________________________  TIME:______________________________________AM/PM    If you need a refill on your cardiac medications before your next appointment, please call your pharmacy.

## 2016-03-22 NOTE — Progress Notes (Signed)
OFFICE NOTE  Chief Complaint:  Follow-up atrial flutter  Primary Care Physician: Nonnie Done., MD  HPI:  [name] is a 60 y.o. male he was kindly referred to me for evaluation of new onset shortness of breath. He saw his primary care provider who had been following him for an abnormal cardiac murmur in the past. In fact Mr. Rion had previously seen a cardiologist in [place], probably more than 5 years ago who did an echocardiogram and then she had valvular heart disease which could get worse over time. He had not followed up with his cardiologist. On presentation last month to his primary care provider he was notably short of breath and underwent a chest x-ray which showed some mild pulmonary edema as well as COPD changes. He underwent office spirometry which indicated obstruction and possible restriction with some bronchodilator reversibility and an FEV1 less than 80% predicted. Subsequently he was referred to an echocardiogram which was performed at [facility]. I reviewed the interpretation which indicates mild LV dilatation and mild global hypokinesis with an EF of 45-50%. There was marketed dilatation of the left atrium, mild aortic insufficiency and severe aortic stenosis. The peak and mean gradients across the valve or 71 mmHg and 50 mmHg, respectively with a calculated aortic valve area of 0.62 cm. There was also mild to moderate mitral regurgitation. The study was performed on 12/01/2015. [name], but is still able to do physical activities. He denies any chest pain. Family history indicates death by natural causes of family members but no known coronary disease.  02/20/2016  Mr. Eble today returns today for hospital follow-up. He underwent fairly urgent cardiac catheterization by myself, the results of which were as follows:   Mid RCA lesion, 40 %stenosed.  Prox RCA lesion, 10 %stenosed.  Hemodynamic  findings consistent with moderate pulmonary hypertension, mitral valve regurgitation and aortic valve stenosis.   Non-obstructive CAD. Mildly reduced cardiac index. PA 58%, PCWP 28,  Mean PA 45. BNP was 1100 and CXR shows pulmonary edema. A repeat echocardiogram showed that EF has decreased even lower to 20-25%. He was admitted for diuresis and CT surgery was consulted for aortic valve replacement. After diuresis he underwent aortic valve replacement with a 25 mm pericardial Edwards magna ease valve on 01/29/2016, at which time he was found to have severe bicuspid aortic valve stenosis and insufficiency. He also had significant bilateral pleural effusions which were drained with chest tubes. Recovery was uneventful except that he developed atrial fibrillation on or about 02/01/2016. He was started on oral amiodarone and warfarin. After discharge he is INR remains subtherapeutic, but recently had increased to 1.9. A repeat INR checked today was 2.5. An EKG in the office today demonstrates atrial flutter with variable A-V response at 82. There is LVH by voltage. [name] reports reports feeling much better. He is not symptomatic with his atrial flutter. He is about to start cardiac rehabilitation at [facility].  03/22/2016  [name] returns today for follow-up. He remains in atrial flutter which is rate controlled. Overall he seems to be recovering from surgery. He's now had at least one month of consistent therapeutic INRs between 2 and 3. He recently saw Dr. Donata Clay in follow-up who felt that he was doing well from a cardiac standpoint. We had discussed cardioversion as an option if he remained in atrial flutter which is persistent. I did discuss risks and benefits of  electrical cardioversion with him today and he is agreeable to proceed.  PMHx:  Past Medical History:  Diagnosis Date  . CAD (coronary artery disease) 01/23/2016   minor CAD at cath-40% RCA  . COPD (chronic obstructive pulmonary  disease) (HCC)   . Non-ischemic cardiomyopathy (HCC) 01/23/2016   EF 30-35%  . Severe aortic stenosis   . SOBOE (shortness of breath on exertion)     Past Surgical History:  Procedure Laterality Date  . AORTIC VALVE REPLACEMENT N/A 01/29/2016   Procedure: AORTIC VALVE REPLACEMENT (AVR) with Magna Ease size 25mm;  Surgeon: Kerin PernaPeter Van Trigt, MD;  [facility];  Service: Open Heart Surgery;  Laterality: N/A;  . CARDIAC CATHETERIZATION N/A 01/23/2016   Procedure: Right/Left Heart Cath and Coronary Angiography;  Surgeon: Chrystie NoseKenneth C Y[place]ocks, MD;  [facility];  Service: Cardiovascular;  Laterality: N/A;  . CATARACT EXTRACTION  12/2002 & 09/2007  . SHOULDER SURGERY Right 1971  . TEE WITHOUT CARDIOVERSION N/A 01/29/2016   Procedure: TRANSESOPHAGEAL ECHOCARDIOGRAM (TEE);  Surgeon: Kerin PernaPeter Van Trigt, MD;  [facility];  Service: Open Heart Surgery;  Laterality: N/A;    FAMHx:  Family History  Problem Relation Age of Onset  . Breast cancer Mother   . Breast cancer Sister     SOCHx:   reports that he has quit smoking. His smoking use included Cigarettes. He has a 45.00 pack-year smoking history. He uses smokeless tobacco. He reports that he drinks about 7.2 oz of alcohol per week . He reports that he does not use drugs.  ALLERGIES:  Allergies  Allergen Reactions  . No Known Allergies     ROS: Pertinent items noted in HPI and remainder of comprehensive ROS otherwise negative.  HOME MEDS: Current Outpatient Prescriptions  Medication Sig Dispense Refill  . albuterol (PROVENTIL) (5 MG/ML) 0.5% nebulizer solution Take 2.5 mg by nebulization 2 (two) times daily as needed for wheezing or shortness of breath.    Marland Kitchen. amiodarone (PACERONE) 200 MG tablet Take 1 tablet (200 mg total) by mouth daily. 90 tablet 3  . aspirin 81 MG tablet Take 1 tablet (81 mg total) by mouth daily.    Marland Kitchen. BREO ELLIPTA 100-25 MCG/INH AEPB USE 1 INHALATION DAILY  0  . carvedilol (COREG) 3.125 MG tablet Take 1  tablet (3.125 mg total) by mouth 2 (two) times daily with a meal. 180 tablet 3  . finasteride (PROSCAR) 5 MG tablet Take 1 tablet (5 mg total) by mouth daily. 30 tablet 1  . furosemide (LASIX) 40 MG tablet Take 1 tablet (40 mg total) by mouth daily. 90 tablet 3  . INCRUSE ELLIPTA 62.5 MCG/INH AEPB Take 1 puff by mouth daily.  1  . oxyCODONE (OXY IR/ROXICODONE) 5 MG immediate release tablet Take 1 tablet (5 mg total) by mouth every 4 (four) hours as needed for severe pain. 30 tablet 0  . potassium chloride SA (K-DUR,KLOR-CON) 20 MEQ tablet Take 1 tablet (20 mEq total) by mouth daily. 90 tablet 3  . tamsulosin (FLOMAX) 0.4 MG CAPS capsule Take 0.4 mg by mouth daily.  1  . warfarin (COUMADIN) 5 MG tablet Take 1 or 1.5 tablets daily as directed by Coumadin clinic 45 tablet 1   No current facility-administered medications for this visit.     LABS/IMAGING: No results found for this or any previous visit (from the past 48 hour(s)). No results found.  WEIGHTS: Wt Readings from Last 3 Encounters:  03/22/16 178 lb 6.4 oz (80.9 kg)  02/28/16 166 lb (75.3 kg)  02/20/16 166 lb 9.6 oz (75.6 kg)    VITALS: BP 110/66 (BP Location: Right Arm, Patient Position: Sitting, Cuff Size: Normal)   Pulse 89   Ht 5\' 9"  (1.753 m)   Wt 178 lb 6.4 oz (80.9 kg)   SpO2 96%   BMI 26.35 kg/m   EXAM: General appearance: alert and no distress Neck: no carotid bruit and no JVD Lungs: clear to auscultation bilaterally Heart: regular rate and rhythm, S1, S2 normal, no murmur, click, rub or gallop Abdomen: soft, non-tender; bowel sounds normal; no masses,  no organomegaly Extremities: extremities normal, atraumatic, no cyanosis or edema Pulses: 2+ and symmetric Skin: Skin color, texture, turgor normal. No rashes or lesions Neurologic: Grossly normal Psych: Pleasant  EKG: Atrial flutter with variable A-V response at 89, LVH with repolarization abnormality  ASSESSMENT: 1. Severe symptomatic bicuspid calcific  aortic stenosis - s/p 25 mm Edwards MagnaEase tissue valve (12/2015) 2. Mild to moderate mitral regurgitation with "marked" left atrial enlargement 3. COPD and ongoing tobacco abuse 4. Dilated cardiomyopathy with EF 45-50% -> reduced to 20-25% perioperatively 5. Persistent postoperative atrial flutter-CHADSVASC score of 3 (on warfarin)  PLAN: 1.   Mr. Colin remains in persistent atrial flutter. His INR has been therapeutic with consecutive weekly checks. He is a good candidate for electrical cardioversion. We discussed the risks and benefits as well as alternatives to cardioversion today and he is willing to proceed. We'll try to schedule that next week. Follow-up with me approximately one month afterwards.  Chrystie Nose, MD, [facility] Attending Cardiologist [facility]  Lisette Abu [facility] 03/22/2016, 9:24 AM

## 2016-03-23 ENCOUNTER — Other Ambulatory Visit: Payer: Self-pay | Admitting: Physician Assistant

## 2016-03-26 ENCOUNTER — Encounter (HOSPITAL_COMMUNITY): Payer: Self-pay | Admitting: *Deleted

## 2016-03-26 ENCOUNTER — Ambulatory Visit (HOSPITAL_COMMUNITY): Payer: BLUE CROSS/BLUE SHIELD | Admitting: Anesthesiology

## 2016-03-26 ENCOUNTER — Other Ambulatory Visit: Payer: Self-pay | Admitting: *Deleted

## 2016-03-26 ENCOUNTER — Ambulatory Visit (HOSPITAL_COMMUNITY)
Admission: RE | Admit: 2016-03-26 | Discharge: 2016-03-26 | Disposition: A | Discharge status: Home / Self Care | Payer: BLUE CROSS/BLUE SHIELD | Source: Ambulatory Visit | Attending: Internal Medicine | Admitting: Internal Medicine

## 2016-03-26 ENCOUNTER — Encounter (HOSPITAL_COMMUNITY)
Admission: RE | Disposition: A | Discharge status: Home / Self Care | Payer: Self-pay | Source: Ambulatory Visit | Attending: Internal Medicine

## 2016-03-26 DIAGNOSIS — I2510 Atherosclerotic heart disease of native coronary artery without angina pectoris: Secondary | ICD-10-CM | POA: Insufficient documentation

## 2016-03-26 DIAGNOSIS — Z87891 Personal history of nicotine dependence: Secondary | ICD-10-CM | POA: Insufficient documentation

## 2016-03-26 DIAGNOSIS — I4892 Unspecified atrial flutter: Secondary | ICD-10-CM | POA: Diagnosis present

## 2016-03-26 DIAGNOSIS — I483 Typical atrial flutter: Secondary | ICD-10-CM

## 2016-03-26 DIAGNOSIS — I509 Heart failure, unspecified: Secondary | ICD-10-CM | POA: Insufficient documentation

## 2016-03-26 DIAGNOSIS — I4891 Unspecified atrial fibrillation: Secondary | ICD-10-CM | POA: Insufficient documentation

## 2016-03-26 DIAGNOSIS — J449 Chronic obstructive pulmonary disease, unspecified: Secondary | ICD-10-CM | POA: Diagnosis not present

## 2016-03-26 HISTORY — PX: CARDIOVERSION: SHX1299

## 2016-03-26 LAB — PROTIME-INR
INR: 2.58
PROTHROMBIN TIME: 28.1 s — AB (ref 11.4–15.2)

## 2016-03-26 SURGERY — CARDIOVERSION
Anesthesia: Monitor Anesthesia Care

## 2016-03-26 MED ORDER — PROPOFOL 10 MG/ML IV BOLUS
INTRAVENOUS | Status: DC | PRN
  Administered 2016-03-26: 100 mg via INTRAVENOUS

## 2016-03-26 MED ORDER — SODIUM CHLORIDE 0.9 % IV SOLN
INTRAVENOUS | Status: DC | PRN
  Administered 2016-03-26: 11:00:00 via INTRAVENOUS

## 2016-03-26 MED ORDER — SODIUM CHLORIDE 0.9 % IV SOLN: INTRAVENOUS | Status: DC

## 2016-03-26 MED ORDER — LIDOCAINE HCL (CARDIAC) 20 MG/ML IV SOLN
INTRAVENOUS | Status: DC | PRN
  Administered 2016-03-26: 30 mg via INTRATRACHEAL

## 2016-03-26 NOTE — Transfer of Care (Signed)
Immediate Anesthesia Transfer of Care Note  Patient: [name]  Procedure(s) Performed: Procedure(s): CARDIOVERSION (N/A)  Patient Location: Endoscopy Unit  Anesthesia Type:MAC  Level of Consciousness: awake, alert  and oriented  Airway & Oxygen Therapy: Patient Spontanous Breathing and Patient connected to nasal cannula oxygen  Post-op Assessment: Report given to RN, Post -op Vital signs reviewed and stable and Patient moving all extremities X 4  Post vital signs: Reviewed and stable  Last Vitals:  Vitals:   03/26/16 1026 03/26/16 1123  BP: (!) 157/87 120/70  Pulse: 90 70  Resp: 20 20  Temp: 36.9 C     Last Pain:  Vitals:   03/26/16 1026  TempSrc: Oral         Complications: No apparent anesthesia complications

## 2016-03-26 NOTE — Anesthesia Postprocedure Evaluation (Signed)
Anesthesia Post Note  Patient: [name]  Procedure(s) Performed: Procedure(s) (LRB): CARDIOVERSION (N/A)  Patient location during evaluation: Endoscopy Anesthesia Type: General Level of consciousness: awake, awake and alert and oriented Pain management: pain level controlled Vital Signs Assessment: post-procedure vital signs reviewed and stable Respiratory status: spontaneous breathing, nonlabored ventilation and respiratory function stable Cardiovascular status: blood pressure returned to baseline Anesthetic complications: no    Last Vitals:  Vitals:   03/26/16 1130 03/26/16 1140  BP: 120/70 134/83  Pulse: 67 71  Resp: (!) 23 19  Temp:      Last Pain:  Vitals:   03/26/16 1123  TempSrc: Oral                 Janene Yousuf COKER

## 2016-03-26 NOTE — Anesthesia Procedure Notes (Signed)
Procedure Name: MAC Date/Time: 03/26/2016 1:11 AM Performed by: Carmela Rima Pre-anesthesia Checklist: Timeout performed, Patient being monitored, Suction available, Emergency Drugs available and Patient identified Patient Re-evaluated:Patient Re-evaluated prior to inductionOxygen Delivery Method: Ambu bag Preoxygenation: Pre-oxygenation with 100% oxygen Intubation Type: IV induction

## 2016-03-26 NOTE — Discharge Instructions (Signed)
Electrical Cardioversion °Electrical cardioversion is the delivery of a jolt of electricity to change the rhythm of the heart. Sticky patches or metal paddles are placed on the chest to deliver the electricity from a device. This is done to restore a normal rhythm. A rhythm that is too fast or not regular keeps the heart from pumping well. °Electrical cardioversion is done in an emergency if:  °· There is low or no blood pressure as a result of the heart rhythm.   °· Normal rhythm must be restored as fast as possible to protect the brain and heart from further damage.   °· It may save a life. °Cardioversion may be done for heart rhythms that are not immediately life threatening, such as atrial fibrillation or flutter, in which:  °· The heart is beating too fast or is not regular.   °· Medicine to change the rhythm has not worked.   °· It is safe to wait in order to allow time for preparation. °· Symptoms of the abnormal rhythm are bothersome. °· The risk of stroke and other serious problems can be reduced. °LET YOUR HEALTH CARE PROVIDER KNOW ABOUT:  °· Any allergies you have. °· All medicines you are taking, including vitamins, herbs, eye drops, creams, and over-the-counter medicines. °· Previous problems you or members of your family have had with the use of anesthetics.   °· Any blood disorders you have.   °· Previous surgeries you have had.   °· Medical conditions you have. °RISKS AND COMPLICATIONS  °Generally, this is a safe procedure. However, problems can occur and include:  °· Breathing problems related to the anesthetic used. °· A blood clot that breaks free and travels to other parts of your body. This could cause a stroke or other problems. The risk of this is lowered by use of blood-thinning medicine (anticoagulant) prior to the procedure. °· Cardiac arrest (rare). °BEFORE THE PROCEDURE  °· You may have tests to detect blood clots in your heart and to evaluate heart function.  °· You may start taking  anticoagulants so your blood does not clot as easily.   °· Medicines may be given to help stabilize your heart rate and rhythm. °PROCEDURE °· You will be given medicine through an IV tube to reduce discomfort and make you sleepy (sedative).   °· An electrical shock will be delivered. °AFTER THE PROCEDURE °Your heart rhythm will be watched to make sure it does not change.  °  °This information is not intended to replace advice given to you by your health care provider. Make sure you discuss any questions you have with your health care provider. °  °Document Released: 06/07/2002 Document Revised: 07/08/2014 Document Reviewed: 12/30/2012 °Elsevier Interactive Patient Education ©2016 [employer] Inc[employer] ° °

## 2016-03-26 NOTE — CV Procedure (Signed)
    CARDIOVERSION NOTE  Procedure: Electrical Cardioversion Indications:  Atrial Flutter  Procedure Details:  Consent: Risks of procedure as well as the alternatives and risks of each were explained to the (patient/caregiver).  Consent for procedure obtained.  Time Out: Verified patient identification, verified procedure, site/side was marked, verified correct patient position, special equipment/implants available, medications/allergies/relevent history reviewed, required imaging and test results available.  Performed  Patient placed on cardiac monitor, pulse oximetry, supplemental oxygen as necessary.  Sedation given: Propofol per anesthesia Pacer pads placed anterior and posterior chest.  Cardioverted 1 time(s).  Cardioverted at 150J biphasic.  Impression: Findings: Post procedure EKG shows: NSR Complications: None Patient did tolerate procedure well.  Plan: 1. Successful DCCV to NSR with a single 150J biphasic shock. 2. Continue warfarin.  3. Follow-up with me in November.  Time Spent Directly with the Patient:  30 minutes   Chrystie Nose, MD, [facility] Attending Cardiologist [facility]  Chrystie Nose 03/26/2016, 11:27 AM

## 2016-03-26 NOTE — Anesthesia Preprocedure Evaluation (Addendum)
Anesthesia Evaluation  Patient identified by MRN, date of birth, ID band Patient awake    Reviewed: Allergy & Precautions, H&P , NPO status , Patient's Chart, lab work & pertinent test results  Airway Mallampati: I  TM Distance: >3 FB     Dental  (+) Edentulous Upper, Edentulous Lower, Dental Advidsory Given   Pulmonary shortness of breath and with exertion, COPD,  COPD inhaler, former smoker,    breath sounds clear to auscultation       Cardiovascular + CAD and +CHF  Atrial Fibrillation  Rhythm:Irregular Rate:Normal     Neuro/Psych    GI/Hepatic   Endo/Other    Renal/GU      Musculoskeletal   Abdominal   Peds  Hematology   Anesthesia Other Findings - Left ventricle: The cavity size was severely dilated. Systolic   function was moderately to severely reduced. The estimated   ejection fraction was in the range of 30% to 35%. Diffuse   hypokinesis. - Aortic valve: Valve mobility was restricted. There was severe   stenosis. There was moderate regurgitation. Peak velocity (S):   445 cm/s. Mean gradient (S): 48 mm Hg. Valve area (VTI): 0.6   cm^2. Valve area (Vmax): 0.57 cm^2. Valve area (Vmean): 0.57   cm^2. - Mitral valve: Transvalvular velocity was within the normal range.   There was no evidence for stenosis. There was mild regurgitation. - Left atrium: The atrium was severely dilated. - Right ventricle: The cavity size was normal. Wall thickness was   normal. Systolic function was normal. - Right atrium: The atrium was mildly dilated. - Tricuspid valve: There was no regurgitation. - Inferior vena cava: The vessel was normal in size. The   respirophasic diameter changes were in the normal range (>= 50%),   consistent with normal central venous pressure. - Pericardium, extracardiac: A trivial pericardial effusion was   identified. There was a large left pleural effusion.  Reproductive/Obstetrics                            Anesthesia Physical Anesthesia Plan  ASA: III  Anesthesia Plan:    Post-op Pain Management:    Induction:   Airway Management Planned:   Additional Equipment:   Intra-op Plan:   Post-operative Plan:   Informed Consent:   Dental Advisory Given  Plan Discussed with: Anesthesiologist and CRNA  Anesthesia Plan Comments:         Anesthesia Quick Evaluation

## 2016-03-26 NOTE — H&P (Signed)
    INTERVAL PROCEDURE H&P  History and Physical Interval Note:  03/26/2016 10:58 AM  [name] has presented today for their planned procedure. The various methods of treatment have been discussed with the patient and family. After consideration of risks, benefits and other options for treatment, the patient has consented to the procedure.  The patients' outpatient history has been reviewed, patient examined, and no change in status from most recent office note within the past 30 days. I have reviewed the patients' chart and labs and will proceed as planned. Questions were answered to the patient's satisfaction.   Chrystie Nose, MD, [facility] Attending Cardiologist [facility]  Chrystie Nose 03/26/2016, 10:58 AM

## 2016-03-28 ENCOUNTER — Ambulatory Visit (INDEPENDENT_AMBULATORY_CARE_PROVIDER_SITE_OTHER): Payer: BLUE CROSS/BLUE SHIELD | Admitting: Pharmacist Clinician (PhC)/ Clinical Pharmacy Specialist

## 2016-03-28 DIAGNOSIS — I480 Paroxysmal atrial fibrillation: Secondary | ICD-10-CM | POA: Diagnosis not present

## 2016-03-28 DIAGNOSIS — Z7901 Long term (current) use of anticoagulants: Secondary | ICD-10-CM | POA: Diagnosis not present

## 2016-03-28 LAB — POCT INR: INR: 2.9

## 2016-04-10 ENCOUNTER — Telehealth: Payer: Self-pay | Admitting: Internal Medicine

## 2016-04-10 NOTE — Telephone Encounter (Signed)
New message      Pt is returning to work oct 23rd.  He needs a work to return to work.  Please include any restrictions in the note.  Pt will pick up the note this Friday the 13th when he is here for a coumadin check.  Please call if there is a problem

## 2016-04-10 NOTE — Telephone Encounter (Signed)
Message sent to Dr.Hilty for advice when can patient return to work and if any restrictions.

## 2016-04-12 ENCOUNTER — Ambulatory Visit (INDEPENDENT_AMBULATORY_CARE_PROVIDER_SITE_OTHER): Payer: BLUE CROSS/BLUE SHIELD | Admitting: Pharmacist Clinician (PhC)/ Clinical Pharmacy Specialist

## 2016-04-12 DIAGNOSIS — I480 Paroxysmal atrial fibrillation: Secondary | ICD-10-CM

## 2016-04-12 DIAGNOSIS — Z7901 Long term (current) use of anticoagulants: Secondary | ICD-10-CM | POA: Diagnosis not present

## 2016-04-12 LAB — POCT INR: INR: 3.3

## 2016-04-12 NOTE — Telephone Encounter (Signed)
No restrictions.  -Dr. Rexene Edison

## 2016-04-12 NOTE — Telephone Encounter (Signed)
Informed patient awaiting for information from Dr Rennis Golden in regards to  Any restriction and return back to work

## 2016-04-12 NOTE — Telephone Encounter (Signed)
LEFT MESSAGE FOR PATIENT  LETTER HAS BEEN SENT TO PATIENT VIA MYCHART IF ACTUAL LETTER IS NEEDED. PLEASE CONTACT OFFICE.

## 2016-04-12 NOTE — Telephone Encounter (Signed)
Spoke to wife. She states patient is not able to access mychart. Patient or wife will pick letter on Monday 10/ 16/17 from office

## 2016-04-15 ENCOUNTER — Other Ambulatory Visit: Payer: Self-pay | Admitting: Pharmacist Clinician (PhC)/ Clinical Pharmacy Specialist

## 2016-04-15 MED ORDER — RIVAROXABAN 20 MG PO TABS: 20.0000 mg | ORAL_TABLET | Freq: Every day | ORAL | 6 refills | Status: DC

## 2016-04-23 ENCOUNTER — Other Ambulatory Visit: Payer: Self-pay | Admitting: Physician Assistant

## 2016-04-23 ENCOUNTER — Other Ambulatory Visit: Payer: Self-pay | Admitting: Internal Medicine

## 2016-05-10 ENCOUNTER — Other Ambulatory Visit: Payer: Self-pay | Admitting: Physician Assistant

## 2016-05-21 ENCOUNTER — Encounter: Payer: Self-pay | Admitting: Internal Medicine

## 2016-05-21 ENCOUNTER — Ambulatory Visit (INDEPENDENT_AMBULATORY_CARE_PROVIDER_SITE_OTHER): Payer: BLUE CROSS/BLUE SHIELD | Admitting: Internal Medicine

## 2016-05-21 VITALS — BP 148/80 | HR 57 | Ht 69.0 in | Wt 187.5 lb

## 2016-05-21 DIAGNOSIS — I480 Paroxysmal atrial fibrillation: Secondary | ICD-10-CM | POA: Diagnosis not present

## 2016-05-21 DIAGNOSIS — I428 Other cardiomyopathies: Secondary | ICD-10-CM

## 2016-05-21 DIAGNOSIS — R5383 Other fatigue: Secondary | ICD-10-CM

## 2016-05-21 DIAGNOSIS — Z952 Presence of prosthetic heart valve: Secondary | ICD-10-CM | POA: Diagnosis not present

## 2016-05-21 MED ORDER — AMIODARONE HCL 200 MG PO TABS: 200.0000 mg | ORAL_TABLET | Freq: Every day | ORAL | 0 refills | Status: DC

## 2016-05-21 NOTE — Progress Notes (Signed)
OFFICE NOTE  Chief Complaint:  Follow-up cardioversion  Primary Care Physician: Nonnie Done., MD  HPI:  [name] is a 60 y.o. male he was kindly referred to me for evaluation of new onset shortness of breath. He saw his primary care provider who had been following him for an abnormal cardiac murmur in the past. In fact [name] had previously seen a cardiologist in [place], probably more than 5 years ago who did an echocardiogram and then she had valvular heart disease which could get worse over time. He had not followed up with his cardiologist. On presentation last month to his primary care provider he was notably short of breath and underwent a chest x-ray which showed some mild pulmonary edema as well as COPD changes. He underwent office spirometry which indicated obstruction and possible restriction with some bronchodilator reversibility and an FEV1 less than 80% predicted. Subsequently he was referred to an echocardiogram which was performed at [facility]. I reviewed the interpretation which indicates mild LV dilatation and mild global hypokinesis with an EF of 45-50%. There was marketed dilatation of the left atrium, mild aortic insufficiency and severe aortic stenosis. The peak and mean gradients across the valve or 71 mmHg and 50 mmHg, respectively with a calculated aortic valve area of 0.62 cm. There was also mild to moderate mitral regurgitation. The study was performed on 12/01/2015. [name], but is still able to do physical activities. He denies any chest pain. Family history indicates death by natural causes of family members but no known coronary disease.  02/20/2016  [name] today returns today for hospital follow-up. He underwent fairly urgent cardiac catheterization by myself, the results of which were as follows:   Mid RCA lesion, 40 %stenosed.  Prox RCA lesion, 10 %stenosed.  Hemodynamic  findings consistent with moderate pulmonary hypertension, mitral valve regurgitation and aortic valve stenosis.   Non-obstructive CAD. Mildly reduced cardiac index. PA 58%, PCWP 28,  Mean PA 45. BNP was 1100 and CXR shows pulmonary edema. A repeat echocardiogram showed that EF has decreased even lower to 20-25%. He was admitted for diuresis and CT surgery was consulted for aortic valve replacement. After diuresis he underwent aortic valve replacement with a 25 mm pericardial Edwards magna ease valve on 01/29/2016, at which time he was found to have severe bicuspid aortic valve stenosis and insufficiency. He also had significant bilateral pleural effusions which were drained with chest tubes. Recovery was uneventful except that he developed atrial fibrillation on or about 02/01/2016. He was started on oral amiodarone and warfarin. After discharge he is INR remains subtherapeutic, but recently had increased to 1.9. A repeat INR checked today was 2.5. An EKG in the office today demonstrates atrial flutter with variable A-V response at 82. There is LVH by voltage. [name] reports reports feeling much better. He is not symptomatic with his atrial flutter. He is about to start cardiac rehabilitation at [facility].  03/22/2016  [name] returns today for follow-up. He remains in atrial flutter which is rate controlled. Overall he seems to be recovering from surgery. He's now had at least one month of consistent therapeutic INRs between 2 and 3. He recently saw Dr. Donata Clay in follow-up who felt that he was doing well from a cardiac standpoint. We had discussed cardioversion as an option if he remained in atrial flutter which is persistent. I did discuss risks and benefits of electrical  cardioversion with him today and he is agreeable to proceed.  05/21/2016  [name] returns for follow-up today. He is maintaining sinus rhythm on low dose amiodarone after cardioversion. He reports some improvement in  his symptoms. He still reports daytime fatigue and is noted to snore - I wonder if he has OSA. He says he is almost out of amiodarone. He denies worsening dyspnea or weight gain.  PMHx:  Past Medical History:  Diagnosis Date  . CAD (coronary artery disease) 01/23/2016   minor CAD at cath-40% RCA  . COPD (chronic obstructive pulmonary disease) (HCC)   . Non-ischemic cardiomyopathy (HCC) 01/23/2016   EF 30-35%  . Severe aortic stenosis   . SOBOE (shortness of breath on exertion)     Past Surgical History:  Procedure Laterality Date  . AORTIC VALVE REPLACEMENT N/A 01/29/2016   Procedure: AORTIC VALVE REPLACEMENT (AVR) with Magna Ease size 25mm;  Surgeon: [name], MD;  Location: [facility] OR;  Service: Open Heart Surgery;  Laterality: N/A;  . CARDIAC CATHETERIZATION N/A 01/23/2016   Procedure: Right/Left Heart Cath and Coronary Angiography;  Surgeon: Chrystie Nose, MD;  Location: [facility] INVASIVE CV LAB;  Service: Cardiovascular;  Laterality: N/A;  . CARDIOVERSION N/A 03/26/2016   Procedure: CARDIOVERSION;  Surgeon: Chrystie Nose, MD;  Location: [facility] ENDOSCOPY;  Service: Cardiovascular;  Laterality: N/A;  . CATARACT EXTRACTION  12/2002 & 09/2007  . SHOULDER SURGERY Right 1971  . TEE WITHOUT CARDIOVERSION N/A 01/29/2016   Procedure: TRANSESOPHAGEAL ECHOCARDIOGRAM (TEE);  Surgeon: [name], MD;  Location: [facility] OR;  Service: Open Heart Surgery;  Laterality: N/A;    FAMHx:  Family History  Problem Relation Age of Onset  . Breast cancer Mother   . Breast cancer Sister     SOCHx:   reports that he has quit smoking. His smoking use included Cigarettes. He has a 45.00 pack-year smoking history. He uses smokeless tobacco. He reports that he drinks about 7.2 oz of alcohol per week . He reports that he does not use drugs.  ALLERGIES:  Allergies  Allergen Reactions  . No Known Allergies     ROS: Pertinent items noted in HPI and remainder of comprehensive ROS otherwise negative.  HOME  MEDS: Current Outpatient Prescriptions  Medication Sig Dispense Refill  . albuterol (PROVENTIL) (5 MG/ML) 0.5% nebulizer solution Take 2.5 mg by nebulization 2 (two) times daily as needed for wheezing or shortness of breath.    Marland Kitchen amiodarone (PACERONE) 200 MG tablet Take 1 tablet (200 mg total) by mouth daily. 90 tablet 3  . aspirin 81 MG tablet Take 1 tablet (81 mg total) by mouth daily.    Marland Kitchen BREO ELLIPTA 100-25 MCG/INH AEPB USE 1 INHALATION DAILY  0  . carvedilol (COREG) 3.125 MG tablet Take 1 tablet (3.125 mg total) by mouth 2 (two) times daily with a meal. 180 tablet 3  . finasteride (PROSCAR) 5 MG tablet Take 1 tablet (5 mg total) by mouth daily. 30 tablet 1  . furosemide (LASIX) 40 MG tablet Take 1 tablet (40 mg total) by mouth daily. 90 tablet 3  . INCRUSE ELLIPTA 62.5 MCG/INH AEPB Take 1 puff by mouth daily.  1  . oxyCODONE (OXY IR/ROXICODONE) 5 MG immediate release tablet Take 1 tablet (5 mg total) by mouth every 4 (four) hours as needed for severe pain. 30 tablet 0  . potassium chloride SA (K-DUR,KLOR-CON) 20 MEQ tablet Take 1 tablet (20 mEq total) by mouth daily. 90 tablet 3  .  rivaroxaban (XARELTO) 20 MG TABS tablet Take 1 tablet (20 mg total) by mouth daily with supper. 30 tablet 6  . tamsulosin (FLOMAX) 0.4 MG CAPS capsule Take 0.4 mg by mouth daily.  1   No current facility-administered medications for this visit.     LABS/IMAGING: No results found for this or any previous visit (from the past 48 hour(s)). No results found.  WEIGHTS: Wt Readings from Last 3 Encounters:  05/21/16 187 lb 8 oz (85 kg)  03/26/16 178 lb (80.7 kg)  03/22/16 178 lb 6.4 oz (80.9 kg)    VITALS: BP (!) 148/80 (BP Location: Left Arm, Patient Position: Sitting, Cuff Size: Normal)   Pulse (!) 57   Ht 5\' 9"  (1.753 m)   Wt 187 lb 8 oz (85 kg)   BMI 27.69 kg/m   EXAM: General appearance: alert and no distress Neck: no carotid bruit and no JVD Lungs: clear to auscultation bilaterally Heart:  regular rate and rhythm, S1, S2 normal, no murmur, click, rub or gallop Abdomen: soft, non-tender; bowel sounds normal; no masses,  no organomegaly Extremities: extremities normal, atraumatic, no cyanosis or edema Pulses: 2+ and symmetric Skin: Skin color, texture, turgor normal. No rashes or lesions Neurologic: Grossly normal Psych: Pleasant  EKG: Sinus bradycardia, T wave abnormalities laterally  ASSESSMENT: 1. Severe symptomatic bicuspid calcific aortic stenosis - s/p 25 mm Edwards MagnaEase tissue valve (12/2015) 2. Mild to moderate mitral regurgitation with "marked" left atrial enlargement 3. COPD and ongoing tobacco abuse 4. Dilated cardiomyopathy with EF 45-50% -> reduced to 20-25% perioperatively 5. Persistent postoperative atrial flutter-CHADSVASC score of 3 (on warfarin), s/p cardioversion and maintaining sinus  PLAN: 1.   Mr. Jeanella Crazeierce is doing better now that he is back in sinus rhythm. I would like for him to remain on amiodarone until at least the beginning of 2018. Plan to see him back then. He will need a repeat echo and hopefully he will have had some improvement in LVEF.  Chrystie NoseKenneth C. Normajean Nash, MD, [facility] Attending Cardiologist [facility]  Chrystie NoseKenneth C Ryn Peine 05/21/2016, 9:16 AM

## 2016-05-21 NOTE — Patient Instructions (Signed)
Medication Instructions:  Your physician recommends that you continue on your current medications as directed. Please refer to the Current Medication list given to you today.`  Labwork: None   Testing/Procedures: Your physician has requested that you have an echocardiogram. Echocardiography is a painless test that uses sound waves to create images of your heart. It provides your doctor with information about the size and shape of your heart and how well your heart's chambers and valves are working. This procedure takes approximately one hour. There are no restrictions for this procedure.  SCHEDULE IN 3 MONTHS   Follow-Up: Your physician recommends that you schedule a follow-up appointment in: After ECHO has been completed WITH DR HILTY  Any Other Special Instructions Will Be Listed Below (If Applicable).     If you need a refill on your cardiac medications before your next appointment, please call your pharmacy.

## 2016-08-12 ENCOUNTER — Ambulatory Visit (HOSPITAL_COMMUNITY): Payer: BLUE CROSS/BLUE SHIELD | Attending: Internal Medicine

## 2016-08-12 ENCOUNTER — Other Ambulatory Visit: Payer: Self-pay

## 2016-08-12 DIAGNOSIS — I341 Nonrheumatic mitral (valve) prolapse: Secondary | ICD-10-CM | POA: Diagnosis not present

## 2016-08-12 DIAGNOSIS — Z953 Presence of xenogenic heart valve: Secondary | ICD-10-CM | POA: Diagnosis not present

## 2016-08-12 DIAGNOSIS — Z87891 Personal history of nicotine dependence: Secondary | ICD-10-CM | POA: Diagnosis not present

## 2016-08-12 DIAGNOSIS — J449 Chronic obstructive pulmonary disease, unspecified: Secondary | ICD-10-CM | POA: Diagnosis not present

## 2016-08-12 DIAGNOSIS — Z952 Presence of prosthetic heart valve: Secondary | ICD-10-CM

## 2016-08-12 DIAGNOSIS — I428 Other cardiomyopathies: Secondary | ICD-10-CM | POA: Insufficient documentation

## 2016-08-22 ENCOUNTER — Ambulatory Visit: Payer: BLUE CROSS/BLUE SHIELD | Admitting: Internal Medicine

## 2016-08-28 ENCOUNTER — Encounter: Payer: Self-pay | Admitting: Internal Medicine

## 2016-08-28 ENCOUNTER — Ambulatory Visit (INDEPENDENT_AMBULATORY_CARE_PROVIDER_SITE_OTHER): Payer: BLUE CROSS/BLUE SHIELD | Admitting: Internal Medicine

## 2016-08-28 VITALS — BP 182/94 | HR 63 | Ht 69.5 in | Wt 203.6 lb

## 2016-08-28 DIAGNOSIS — I739 Peripheral vascular disease, unspecified: Secondary | ICD-10-CM | POA: Diagnosis not present

## 2016-08-28 DIAGNOSIS — I428 Other cardiomyopathies: Secondary | ICD-10-CM

## 2016-08-28 DIAGNOSIS — Z952 Presence of prosthetic heart valve: Secondary | ICD-10-CM | POA: Diagnosis not present

## 2016-08-28 DIAGNOSIS — I480 Paroxysmal atrial fibrillation: Secondary | ICD-10-CM | POA: Diagnosis not present

## 2016-08-28 DIAGNOSIS — Z79899 Other long term (current) drug therapy: Secondary | ICD-10-CM

## 2016-08-28 MED ORDER — VALSARTAN 160 MG PO TABS: 160.0000 mg | ORAL_TABLET | Freq: Every day | ORAL | 11 refills | Status: DC

## 2016-08-28 MED ORDER — BREO ELLIPTA 100-25 MCG/INH IN AEPB: INHALATION_SPRAY | RESPIRATORY_TRACT | 0 refills | Status: DC

## 2016-08-28 NOTE — Progress Notes (Signed)
OFFICE NOTE  Chief Complaint:  No complaints  Primary Care Physician: Nonnie Done., MD  HPI:  [name] is a 61 y.o. male he was kindly referred to me for evaluation of new onset shortness of breath. He saw his primary care provider who had been following him for an abnormal cardiac murmur in the past. In fact [name] had previously seen a cardiologist in [place], probably more than 5 years ago who did an echocardiogram and then she had valvular heart disease which could get worse over time. He had not followed up with his cardiologist. On presentation last month to his primary care provider he was notably short of breath and underwent a chest x-ray which showed some mild pulmonary edema as well as COPD changes. He underwent office spirometry which indicated obstruction and possible restriction with some bronchodilator reversibility and an FEV1 less than 80% predicted. Subsequently he was referred to an echocardiogram which was performed at [facility]. I reviewed the interpretation which indicates mild LV dilatation and mild global hypokinesis with an EF of 45-50%. There was marketed dilatation of the left atrium, mild aortic insufficiency and severe aortic stenosis. The peak and mean gradients across the valve or 71 mmHg and 50 mmHg, respectively with a calculated aortic valve area of 0.62 cm. There was also mild to moderate mitral regurgitation. The study was performed on 12/01/2015. [name], but is still able to do physical activities. He denies any chest pain. Family history indicates death by natural causes of family members but no known coronary disease.  02/20/2016  [name] today returns today for hospital follow-up. He underwent fairly urgent cardiac catheterization by myself, the results of which were as follows:   Mid RCA lesion, 40 %stenosed.  Prox RCA lesion, 10 %stenosed.  Hemodynamic findings  consistent with moderate pulmonary hypertension, mitral valve regurgitation and aortic valve stenosis.   Non-obstructive CAD. Mildly reduced cardiac index. PA 58%, PCWP 28,  Mean PA 45. BNP was 1100 and CXR shows pulmonary edema. A repeat echocardiogram showed that EF has decreased even lower to 20-25%. He was admitted for diuresis and CT surgery was consulted for aortic valve replacement. After diuresis he underwent aortic valve replacement with a 25 mm pericardial Edwards magna ease valve on 01/29/2016, at which time he was found to have severe bicuspid aortic valve stenosis and insufficiency. He also had significant bilateral pleural effusions which were drained with chest tubes. Recovery was uneventful except that he developed atrial fibrillation on or about 02/01/2016. He was started on oral amiodarone and warfarin. After discharge he is INR remains subtherapeutic, but recently had increased to 1.9. A repeat INR checked today was 2.5. An EKG in the office today demonstrates atrial flutter with variable A-V response at 82. There is LVH by voltage. [name] reports reports feeling much better. He is not symptomatic with his atrial flutter. He is about to start cardiac rehabilitation at [facility].  03/22/2016  [name] returns today for follow-up. He remains in atrial flutter which is rate controlled. Overall he seems to be recovering from surgery. He's now had at least one month of consistent therapeutic INRs between 2 and 3. He recently saw Dr. Donata Clay in follow-up who felt that he was doing well from a cardiac standpoint. We had discussed cardioversion as an option if he remained in atrial flutter which is persistent. I did discuss risks and benefits of electrical  cardioversion with him today and he is agreeable to proceed.  05/21/2016  [name] returns for follow-up today. He is maintaining sinus rhythm on low dose amiodarone after cardioversion. He reports some improvement in his  symptoms. He still reports daytime fatigue and is noted to snore - I wonder if he has OSA. He says he is almost out of amiodarone. He denies worsening dyspnea or weight gain.  08/28/2016  [name] is seen today in follow-up. Overall he feels well although he recently has noted elevated blood pressure. He was seen at his pulmonologist office and blood pressure was over 200 systolic and greater than 100 diastolic. Today blood pressure was 182/94. Previously blood pressure had been well controlled. He has had an improvement in LV function after surgery - echo performed this month showed an improvement in LVEF up to 45-50% however it is not totally normalized. He denies any recurrent atrial fibrillation since he underwent recent cardioversion. He is on amiodarone but has weaned down to 200 mg daily. EKG today shows sinus rhythm with mild lateral ST and T-wave changes. He also reports today that he's been having some problems with left lower extremity pain. Particularly notes that after walking about 150 yards he gets some pain in his left calf. He noted this during cardiac rehabilitation and it does seem to persist. He's concerned about PAD.  PMHx:  Past Medical History:  Diagnosis Date  . CAD (coronary artery disease) 01/23/2016   minor CAD at cath-40% RCA  . COPD (chronic obstructive pulmonary disease) (HCC)   . Non-ischemic cardiomyopathy (HCC) 01/23/2016   EF 30-35%  . Severe aortic stenosis   . SOBOE (shortness of breath on exertion)     Past Surgical History:  Procedure Laterality Date  . AORTIC VALVE REPLACEMENT N/A 01/29/2016   Procedure: AORTIC VALVE REPLACEMENT (AVR) with Magna Ease size 25mm;  Surgeon: [name], MD;  Location: [facility] OR;  Service: Open Heart Surgery;  Laterality: N/A;  . CARDIAC CATHETERIZATION N/A 01/23/2016   Procedure: Right/Left Heart Cath and Coronary Angiography;  Surgeon: Chrystie Nose, MD;  Location: [facility] INVASIVE CV LAB;  Service: Cardiovascular;  Laterality:  N/A;  . CARDIOVERSION N/A 03/26/2016   Procedure: CARDIOVERSION;  Surgeon: Chrystie Nose, MD;  Location: [facility] ENDOSCOPY;  Service: Cardiovascular;  Laterality: N/A;  . CATARACT EXTRACTION  12/2002 & 09/2007  . SHOULDER SURGERY Right 1971  . TEE WITHOUT CARDIOVERSION N/A 01/29/2016   Procedure: TRANSESOPHAGEAL ECHOCARDIOGRAM (TEE);  Surgeon: [name], MD;  Location: [facility] OR;  Service: Open Heart Surgery;  Laterality: N/A;    FAMHx:  Family History  Problem Relation Age of Onset  . Breast cancer Mother   . Breast cancer Sister     SOCHx:   reports that he has quit smoking. His smoking use included Cigarettes. He has a 45.00 pack-year smoking history. He uses smokeless tobacco. He reports that he drinks about 7.2 oz of alcohol per week . He reports that he does not use drugs.  ALLERGIES:  Allergies  Allergen Reactions  . No Known Allergies     ROS: Pertinent items noted in HPI and remainder of comprehensive ROS otherwise negative.  HOME MEDS: Current Outpatient Prescriptions  Medication Sig Dispense Refill  . aspirin 81 MG tablet Take 1 tablet (81 mg total) by mouth daily.    . carvedilol (COREG) 3.125 MG tablet Take 1 tablet (3.125 mg total) by mouth 2 (two) times daily with a meal. 180 tablet 3  .  finasteride (PROSCAR) 5 MG tablet Take 1 tablet (5 mg total) by mouth daily. 30 tablet 1  . furosemide (LASIX) 40 MG tablet Take 1 tablet (40 mg total) by mouth daily. 90 tablet 3  . potassium chloride SA (K-DUR,KLOR-CON) 20 MEQ tablet Take 1 tablet (20 mEq total) by mouth daily. 90 tablet 3  . PROAIR RESPICLICK 108 (90 Base) MCG/ACT AEPB Inhale 2 puffs into the lungs as directed.  3  . rivaroxaban (XARELTO) 20 MG TABS tablet Take 1 tablet (20 mg total) by mouth daily with supper. 30 tablet 6  . tamsulosin (FLOMAX) 0.4 MG CAPS capsule Take 0.4 mg by mouth daily.  1  . BREO ELLIPTA 100-25 MCG/INH AEPB USE 1 INHALATION DAILY  0  . valsartan (DIOVAN) 160 MG tablet Take 1 tablet (160 mg  total) by mouth daily. 30 tablet 11   No current facility-administered medications for this visit.     LABS/IMAGING: No results found for this or any previous visit (from the past 48 hour(s)). No results found.  WEIGHTS: Wt Readings from Last 3 Encounters:  08/28/16 203 lb 9.6 oz (92.4 kg)  05/21/16 187 lb 8 oz (85 kg)  03/26/16 178 lb (80.7 kg)    VITALS: BP (!) 182/94   Pulse 63   Ht 5' 9.5" (1.765 m)   Wt 203 lb 9.6 oz (92.4 kg)   BMI 29.64 kg/m   EXAM: General appearance: alert and no distress Neck: no carotid bruit and no JVD Lungs: clear to auscultation bilaterally Heart: regular rate and rhythm, S1, S2 normal, no murmur, click, rub or gallop Abdomen: soft, non-tender; bowel sounds normal; no masses,  no organomegaly Extremities: extremities normal, atraumatic, no cyanosis or edema Pulses: Diminished left dorsalis pedis pulse Skin: Skin color, texture, turgor normal. No rashes or lesions Neurologic: Grossly normal Psych: Pleasant  EKG: Normal sinus rhythm at 63, ST and T-wave abdomen was laterally  ASSESSMENT: 1. Severe symptomatic bicuspid calcific aortic stenosis - s/p 25 mm Edwards MagnaEase tissue valve (12/2015) 2. Mild to moderate mitral regurgitation with "marked" left atrial enlargement 3. COPD and ongoing tobacco abuse 4. Dilated cardiomyopathy with EF 45-50% -> reduced to 20-25% perioperatively, now improved to 45-50% (2018) 5. Persistent postoperative atrial flutter-CHADSVASC score of 3 (on warfarin), s/p cardioversion and maintaining sinus 6. LLE pain with ambulation, possible claudication  PLAN: 1.   [name] is reporting some left lower extremity pain with ambulation, which could be claudication. This could also be pseudo-claudication. He does have a mildly reduced posterior tibial pulse although his left foot is warm. I would like to get lower extremity arterial Dopplers. Surprisingly he did not have arterial Dopplers prior to valve surgery,  probably because he had no significant coronary disease and did not require coronary artery bypass grafting. Shanley, blood pressure is markedly elevated today. This is been a recent phenomenon. He is guideline indicated to be on ACE inhibitor or ARB for his heart failure in addition to carvedilol. I would like to start a valsartan 160 mg daily in addition to his current dose of carvedilol. We may need to uptitrate medications which I'll review when he returns in about a month. Finally advised him to discontinue his amiodarone today and I think there is a high likelihood of maintaining sinus rhythm as his A. fib most likely was related to his surgery. He should remain on Xarelto and we provided a co-pay assistance card today as well.  Chrystie Nose, MD, [facility] Attending Cardiologist [facility]  Lisette Abu Annabella Elford 08/28/2016, 10:21 AM

## 2016-08-28 NOTE — Patient Instructions (Signed)
Medication Instructions:  STOP AMIODARONE START VALSARTAN 160MG  DAILY   If you need a refill on your cardiac medications before your next appointment, please call your pharmacy.  Labwork: BMP AFTER TAKING VALSARTAN FOR 1 WEEK AT [facility] ON THE 1ST FLOOR  Testing/Procedures: Your physician has requested that you have a lower extremity arterial duplex. This test is an ultrasound of the arteries in the legs or arms. It looks at arterial blood flow in the legs and arms. Allow one hour for Lower and Upper Arterial scans. There are no restrictions or special instructions  Follow-Up: Your physician recommends that you schedule a follow-up appointment in: AFTER Us~ 1 MONTH WITH DR HILTY   Thank you for choosing [facility] at [facility]!!    DR Rennis Golden, MD Marcelino Duster, LPN

## 2016-09-03 ENCOUNTER — Other Ambulatory Visit: Payer: Self-pay | Admitting: Internal Medicine

## 2016-09-03 DIAGNOSIS — I739 Peripheral vascular disease, unspecified: Secondary | ICD-10-CM

## 2016-09-06 LAB — BASIC METABOLIC PANEL
BUN: 15 mg/dL (ref 7–25)
CALCIUM: 9 mg/dL (ref 8.6–10.3)
CHLORIDE: 100 mmol/L (ref 98–110)
CO2: 29 mmol/L (ref 20–31)
CREATININE: 0.91 mg/dL (ref 0.70–1.25)
Glucose, Bld: 99 mg/dL (ref 65–99)
Potassium: 4.9 mmol/L (ref 3.5–5.3)
Sodium: 137 mmol/L (ref 135–146)

## 2016-09-11 ENCOUNTER — Ambulatory Visit (HOSPITAL_COMMUNITY)
Admission: RE | Admit: 2016-09-11 | Discharge: 2016-09-11 | Disposition: A | Discharge status: Home / Self Care | Payer: BLUE CROSS/BLUE SHIELD | Source: Ambulatory Visit | Attending: Cardiovascular Disease | Admitting: Cardiovascular Disease

## 2016-09-11 DIAGNOSIS — J449 Chronic obstructive pulmonary disease, unspecified: Secondary | ICD-10-CM | POA: Diagnosis not present

## 2016-09-11 DIAGNOSIS — I10 Essential (primary) hypertension: Secondary | ICD-10-CM | POA: Diagnosis not present

## 2016-09-11 DIAGNOSIS — I2510 Atherosclerotic heart disease of native coronary artery without angina pectoris: Secondary | ICD-10-CM | POA: Insufficient documentation

## 2016-09-11 DIAGNOSIS — I739 Peripheral vascular disease, unspecified: Secondary | ICD-10-CM | POA: Diagnosis not present

## 2016-09-11 DIAGNOSIS — I70203 Unspecified atherosclerosis of native arteries of extremities, bilateral legs: Secondary | ICD-10-CM | POA: Insufficient documentation

## 2016-10-03 ENCOUNTER — Encounter: Payer: Self-pay | Admitting: Internal Medicine

## 2016-10-03 ENCOUNTER — Ambulatory Visit (INDEPENDENT_AMBULATORY_CARE_PROVIDER_SITE_OTHER): Payer: BLUE CROSS/BLUE SHIELD | Admitting: Internal Medicine

## 2016-10-03 VITALS — BP 162/84 | HR 66 | Ht 69.0 in | Wt 210.0 lb

## 2016-10-03 DIAGNOSIS — I428 Other cardiomyopathies: Secondary | ICD-10-CM

## 2016-10-03 DIAGNOSIS — I10 Essential (primary) hypertension: Secondary | ICD-10-CM | POA: Insufficient documentation

## 2016-10-03 DIAGNOSIS — Z952 Presence of prosthetic heart valve: Secondary | ICD-10-CM

## 2016-10-03 DIAGNOSIS — I739 Peripheral vascular disease, unspecified: Secondary | ICD-10-CM

## 2016-10-03 DIAGNOSIS — K9289 Other specified diseases of the digestive system: Secondary | ICD-10-CM | POA: Insufficient documentation

## 2016-10-03 DIAGNOSIS — Z7901 Long term (current) use of anticoagulants: Secondary | ICD-10-CM

## 2016-10-03 MED ORDER — VALSARTAN 320 MG PO TABS: 160.0000 mg | ORAL_TABLET | Freq: Every day | ORAL | 3 refills | Status: DC

## 2016-10-03 MED ORDER — VALSARTAN 320 MG PO TABS: 320.0000 mg | ORAL_TABLET | Freq: Every day | ORAL | 3 refills | Status: DC

## 2016-10-03 NOTE — Patient Instructions (Addendum)
Medication Instructions: The Valsartan has been increased to 320 mg once a day.   Follow-Up: Your physician recommends that you schedule a follow-up appointment in 3 months with Dr. Rennis Golden.   Any Additional Special Instructions Will Be Listed Below (If Applicable). Dr. Rennis Golden recommends ALIGN probiotic over the counter   If you need a refill on your cardiac medications before your next appointment, please call your pharmacy.

## 2016-10-03 NOTE — Progress Notes (Signed)
OFFICE NOTE  Chief Complaint:  Blood pressure is high, "lots of gas"  Primary Care Physician: Nonnie Done., MD  HPI:  [name] is a 61 y.o. male he was kindly referred to me for evaluation of new onset shortness of breath. He saw his primary care provider who had been following him for an abnormal cardiac murmur in the past. In fact [name] had previously seen a cardiologist in [place], probably more than 5 years ago who did an echocardiogram and then she had valvular heart disease which could get worse over time. He had not followed up with his cardiologist. On presentation last month to his primary care provider he was notably short of breath and underwent a chest x-ray which showed some mild pulmonary edema as well as COPD changes. He underwent office spirometry which indicated obstruction and possible restriction with some bronchodilator reversibility and an FEV1 less than 80% predicted. Subsequently he was referred to an echocardiogram which was performed at [facility]. I reviewed the interpretation which indicates mild LV dilatation and mild global hypokinesis with an EF of 45-50%. There was marketed dilatation of the left atrium, mild aortic insufficiency and severe aortic stenosis. The peak and mean gradients across the valve or 71 mmHg and 50 mmHg, respectively with a calculated aortic valve area of 0.62 cm. There was also mild to moderate mitral regurgitation. The study was performed on 12/01/2015. [name], but is still able to do physical activities. He denies any chest pain. Family history indicates death by natural causes of family members but no known coronary disease.  02/20/2016  [name] today returns today for hospital follow-up. He underwent fairly urgent cardiac catheterization by myself, the results of which were as follows:   Mid RCA lesion, 40 %stenosed.  Prox RCA lesion, 10  %stenosed.  Hemodynamic findings consistent with moderate pulmonary hypertension, mitral valve regurgitation and aortic valve stenosis.   Non-obstructive CAD. Mildly reduced cardiac index. PA 58%, PCWP 28,  Mean PA 45. BNP was 1100 and CXR shows pulmonary edema. A repeat echocardiogram showed that EF has decreased even lower to 20-25%. He was admitted for diuresis and CT surgery was consulted for aortic valve replacement. After diuresis he underwent aortic valve replacement with a 25 mm pericardial Edwards magna ease valve on 01/29/2016, at which time he was found to have severe bicuspid aortic valve stenosis and insufficiency. He also had significant bilateral pleural effusions which were drained with chest tubes. Recovery was uneventful except that he developed atrial fibrillation on or about 02/01/2016. He was started on oral amiodarone and warfarin. After discharge he is INR remains subtherapeutic, but recently had increased to 1.9. A repeat INR checked today was 2.5. An EKG in the office today demonstrates atrial flutter with variable A-V response at 82. There is LVH by voltage. Mr. Leung reports reports feeling much better. He is not symptomatic with his atrial flutter. He is about to start cardiac rehabilitation at [facility].  03/22/2016  [name] returns today for follow-up. He remains in atrial flutter which is rate controlled. Overall he seems to be recovering from surgery. He's now had at least one month of consistent therapeutic INRs between 2 and 3. He recently saw Dr. Donata Clay in follow-up who felt that he was doing well from a cardiac standpoint. We had discussed cardioversion as an option if he remained in atrial flutter which is persistent. I did discuss  risks and benefits of electrical cardioversion with him today and he is agreeable to proceed.  05/21/2016  [name] returns for follow-up today. He is maintaining sinus rhythm on low dose amiodarone after cardioversion. He  reports some improvement in his symptoms. He still reports daytime fatigue and is noted to snore - I wonder if he has OSA. He says he is almost out of amiodarone. He denies worsening dyspnea or weight gain.  08/28/2016  [name] is seen today in follow-up. Overall he feels well although he recently has noted elevated blood pressure. He was seen at his pulmonologist office and blood pressure was over 200 systolic and greater than 100 diastolic. Today blood pressure was 182/94. Previously blood pressure had been well controlled. He has had an improvement in LV function after surgery - echo performed this month showed an improvement in LVEF up to 45-50% however it is not totally normalized. He denies any recurrent atrial fibrillation since he underwent recent cardioversion. He is on amiodarone but has weaned down to 200 mg daily. EKG today shows sinus rhythm with mild lateral ST and T-wave changes. He also reports today that he's been having some problems with left lower extremity pain. Particularly notes that after walking about 150 yards he gets some pain in his left calf. He noted this during cardiac rehabilitation and it does seem to persist. He's concerned about PAD.  10/03/2016  [name] is seen today follow-up. He reports that his blood pressure recently has been very elevated. Generally at home it runs between 1 6170 systolic. He says he doesn't feel well when it's at that level. He recently had peripheral lower extremity arterial Dopplers which indicated a reduced ABI 0.61 on the left and 1.1 on the right. He has been complaining of left lower extremity claudication. I've gone ahead and referred him to Dr. Kirke Corin for evaluation of this. He is also complaining of a lot of gas and bloating. He says he's tried simethicone without relief. His bowel movements are fairly normal. He says he thinks is related to medications however it is not clear which medication would cause this and I do not typically hear  complaints of this. He could've been related to surgery.  PMHx:  Past Medical History:  Diagnosis Date  . CAD (coronary artery disease) 01/23/2016   minor CAD at cath-40% RCA  . COPD (chronic obstructive pulmonary disease) (HCC)   . Non-ischemic cardiomyopathy (HCC) 01/23/2016   EF 30-35%  . Severe aortic stenosis   . SOBOE (shortness of breath on exertion)     Past Surgical History:  Procedure Laterality Date  . AORTIC VALVE REPLACEMENT N/A 01/29/2016   Procedure: AORTIC VALVE REPLACEMENT (AVR) with Magna Ease size 44mm;  Surgeon: [name], MD;  Location: [facility] OR;  Service: Open Heart Surgery;  Laterality: N/A;  . CARDIAC CATHETERIZATION N/A 01/23/2016   Procedure: Right/Left Heart Cath and Coronary Angiography;  Surgeon: Chrystie Nose, MD;  Location: [facility] INVASIVE CV LAB;  Service: Cardiovascular;  Laterality: N/A;  . CARDIOVERSION N/A 03/26/2016   Procedure: CARDIOVERSION;  Surgeon: Chrystie Nose, MD;  Location: [facility] ENDOSCOPY;  Service: Cardiovascular;  Laterality: N/A;  . CATARACT EXTRACTION  12/2002 & 09/2007  . SHOULDER SURGERY Right 1971  . TEE WITHOUT CARDIOVERSION N/A 01/29/2016   Procedure: TRANSESOPHAGEAL ECHOCARDIOGRAM (TEE);  Surgeon: [name], MD;  Location: [facility] OR;  Service: Open Heart Surgery;  Laterality: N/A;    FAMHx:  Family History  Problem Relation Age of  Onset  . Breast cancer Mother   . Breast cancer Sister     SOCHx:   reports that he has quit smoking. His smoking use included Cigarettes. He has a 45.00 pack-year smoking history. He uses smokeless tobacco. He reports that he drinks about 7.2 oz of alcohol per week . He reports that he does not use drugs.  ALLERGIES:  Allergies  Allergen Reactions  . No Known Allergies     ROS: Pertinent items noted in HPI and remainder of comprehensive ROS otherwise negative.  HOME MEDS: Current Outpatient Prescriptions  Medication Sig Dispense Refill  . aspirin 81 MG tablet Take 1 tablet (81 mg total)  by mouth daily.    Marland Kitchen BREO ELLIPTA 100-25 MCG/INH AEPB USE 1 INHALATION DAILY  0  . carvedilol (COREG) 3.125 MG tablet Take 1 tablet (3.125 mg total) by mouth 2 (two) times daily with a meal. 180 tablet 3  . finasteride (PROSCAR) 5 MG tablet Take 1 tablet (5 mg total) by mouth daily. 30 tablet 1  . furosemide (LASIX) 40 MG tablet Take 1 tablet (40 mg total) by mouth daily. 90 tablet 3  . potassium chloride SA (K-DUR,KLOR-CON) 20 MEQ tablet Take 1 tablet (20 mEq total) by mouth daily. 90 tablet 3  . PROAIR RESPICLICK 108 (90 Base) MCG/ACT AEPB Inhale 2 puffs into the lungs as directed.  3  . rivaroxaban (XARELTO) 20 MG TABS tablet Take 1 tablet (20 mg total) by mouth daily with supper. 30 tablet 6  . tamsulosin (FLOMAX) 0.4 MG CAPS capsule Take 0.4 mg by mouth daily.  1  . valsartan (DIOVAN) 320 MG tablet Take 0.5 tablets (160 mg total) by mouth daily. 90 tablet 3   No current facility-administered medications for this visit.     LABS/IMAGING: No results found for this or any previous visit (from the past 48 hour(s)). No results found.  WEIGHTS: Wt Readings from Last 3 Encounters:  10/03/16 210 lb (95.3 kg)  08/28/16 203 lb 9.6 oz (92.4 kg)  05/21/16 187 lb 8 oz (85 kg)    VITALS: BP (!) 162/84   Pulse 66   Ht 5\' 9"  (1.753 m)   Wt 210 lb (95.3 kg)   BMI 31.01 kg/m   EXAM: General appearance: alert and no distress Lungs: clear to auscultation bilaterally Heart: regular rate and rhythm Abdomen: distended, tympanitic  EKG: Deferred  ASSESSMENT: 1. Severe symptomatic bicuspid calcific aortic stenosis - s/p 25 mm Edwards MagnaEase tissue valve (12/2015) 2. Mild to moderate mitral regurgitation with "marked" left atrial enlargement 3. COPD and ongoing tobacco abuse 4. Dilated cardiomyopathy with EF 45-50% -> reduced to 20-25% perioperatively, now improved to 45-50% (2018) 5. Persistent postoperative atrial flutter-CHADSVASC score of 3 (on warfarin), s/p cardioversion and  maintaining sinus 6. LLE pain with ambulation, possible claudication 7. Uncontrolled hypertension 8. Gas/bloating  PLAN: 1.   Mr. Hurrell is reporting left lower chimney claudication is found to have a reduced ABI to 0.61. I referred him to Dr. Kirke Corin for evaluation and management. There is a scheduled follow-up next week. Blood pressures been uncontrolled and I will increase his valsartan and up to 320 mg daily. He can double up the dose will prescribe him the 320 mg tablet. He is also complaining of gas and bloating today which was not improved with [employer] the cone. He has had a colonoscopy in the past and was due for one shortly. I encouraged him to follow-up with his gastroenterologist and in the meantime could  try a probiotic such as align.  Follow-up with me in 3 months.  Chrystie Nose, MD, [facility] Attending Cardiologist [facility]  Chrystie Nose 10/03/2016, 9:31 AM

## 2016-10-08 ENCOUNTER — Encounter: Payer: Self-pay | Admitting: Cardiovascular Disease

## 2016-10-08 ENCOUNTER — Ambulatory Visit (INDEPENDENT_AMBULATORY_CARE_PROVIDER_SITE_OTHER): Payer: BLUE CROSS/BLUE SHIELD | Admitting: Cardiovascular Disease

## 2016-10-08 VITALS — BP 114/80 | HR 64 | Ht 69.5 in | Wt 208.0 lb

## 2016-10-08 DIAGNOSIS — I739 Peripheral vascular disease, unspecified: Secondary | ICD-10-CM | POA: Diagnosis not present

## 2016-10-08 DIAGNOSIS — I5022 Chronic systolic (congestive) heart failure: Secondary | ICD-10-CM | POA: Diagnosis not present

## 2016-10-08 LAB — BASIC METABOLIC PANEL
BUN: 13 mg/dL (ref 7–25)
CALCIUM: 9 mg/dL (ref 8.6–10.3)
CO2: 25 mmol/L (ref 20–31)
Chloride: 101 mmol/L (ref 98–110)
Creat: 1.01 mg/dL (ref 0.70–1.25)
GLUCOSE: 99 mg/dL (ref 65–99)
Potassium: 4.5 mmol/L (ref 3.5–5.3)
Sodium: 137 mmol/L (ref 135–146)

## 2016-10-08 LAB — CBC
HCT: 47.6 % (ref 38.5–50.0)
HEMOGLOBIN: 16.5 g/dL (ref 13.2–17.1)
MCH: 33.2 pg — AB (ref 27.0–33.0)
MCHC: 34.7 g/dL (ref 32.0–36.0)
MCV: 95.8 fL (ref 80.0–100.0)
MPV: 9.6 fL (ref 7.5–12.5)
PLATELETS: 187 10*3/uL (ref 140–400)
RBC: 4.97 MIL/uL (ref 4.20–5.80)
RDW: 14 % (ref 11.0–15.0)
WBC: 5.3 10*3/uL (ref 3.8–10.8)

## 2016-10-08 LAB — PROTIME-INR
INR: 1.2 — AB
PROTHROMBIN TIME: 12.3 s — AB (ref 9.0–11.5)

## 2016-10-08 NOTE — Progress Notes (Signed)
Cardiology Office Note   Date:  10/08/2016   ID:  [name], DOB 11-14-1955, MRN [id number]  PCP:  Nonnie Done., MD  Cardiologist:  Dr. Rennis Golden  Chief Complaint  Patient presents with  . New Patient (Initial Visit)    abnormal ale dopplers, evaluation for claudification      History of Present Illness: [name] is a 61 y.o. male who was referred By Dr. Rennis Golden for evaluation and management of peripheral arterial disease. The patient has known history of chronic systolic heart failure due to nonischemic cardiomyopathy in the setting of severe aortic stenosis due to bicuspid aortic valve. He underwent successful aortic valve replacement in July 2017 with subsequent improvement in LV systolic function. He has other chronic medical conditions that include previous tobacco use. He quit smoking in June 2018. He is not diabetic. Family history is negative for coronary artery disease. The patient is on anticoagulation with Xarelto for postoperative atrial flutter. The patient noted severe left calf pain with walking which started last year and was mostly noticeable when he tried to start cardiac rehabilitation after his valve surgery. He could not do treadmill at all. His symptoms gradually progressed and currently he is able to walk only about 50 yards before having to stop and rest. He usually has to rest for about 5 minutes before he can resume. This has significantly affected his ability to perform activities of daily living and also has affected his functioning at work. He works in [employer].     Past Medical History:  Diagnosis Date  . CAD (coronary artery disease) 01/23/2016   minor CAD at cath-40% RCA  . COPD (chronic obstructive pulmonary disease) (HCC)   . Non-ischemic cardiomyopathy (HCC) 01/23/2016   EF 30-35%  . Severe aortic stenosis   . SOBOE (shortness of breath on exertion)     Past Surgical History:  Procedure Laterality Date  . AORTIC VALVE REPLACEMENT  N/A 01/29/2016   Procedure: AORTIC VALVE REPLACEMENT (AVR) with Magna Ease size 25mm;  Surgeon: [name], MD;  Location: [facility] OR;  Service: Open Heart Surgery;  Laterality: N/A;  . CARDIAC CATHETERIZATION N/A 01/23/2016   Procedure: Right/Left Heart Cath and Coronary Angiography;  Surgeon: Chrystie Nose, MD;  Location: [facility] INVASIVE CV LAB;  Service: Cardiovascular;  Laterality: N/A;  . CARDIOVERSION N/A 03/26/2016   Procedure: CARDIOVERSION;  Surgeon: Chrystie Nose, MD;  Location: [facility] ENDOSCOPY;  Service: Cardiovascular;  Laterality: N/A;  . CATARACT EXTRACTION  12/2002 & 09/2007  . SHOULDER SURGERY Right 1971  . TEE WITHOUT CARDIOVERSION N/A 01/29/2016   Procedure: TRANSESOPHAGEAL ECHOCARDIOGRAM (TEE);  Surgeon: [name], MD;  Location: [facility] OR;  Service: Open Heart Surgery;  Laterality: N/A;     Current Outpatient Prescriptions  Medication Sig Dispense Refill  . aspirin 81 MG tablet Take 1 tablet (81 mg total) by mouth daily.    Marland Kitchen BREO ELLIPTA 100-25 MCG/INH AEPB USE 1 INHALATION DAILY  0  . carvedilol (COREG) 3.125 MG tablet Take 1 tablet (3.125 mg total) by mouth 2 (two) times daily with a meal. 180 tablet 3  . finasteride (PROSCAR) 5 MG tablet Take 1 tablet (5 mg total) by mouth daily. 30 tablet 1  . furosemide (LASIX) 40 MG tablet Take 1 tablet (40 mg total) by mouth daily. 90 tablet 3  . potassium chloride SA (K-DUR,KLOR-CON) 20 MEQ tablet Take 1 tablet (20 mEq total) by mouth daily. 90 tablet 3  . PROAIR RESPICLICK 108 (90 Base)  MCG/ACT AEPB Inhale 2 puffs into the lungs as directed.  3  . rivaroxaban (XARELTO) 20 MG TABS tablet Take 1 tablet (20 mg total) by mouth daily with supper. 30 tablet 6  . tamsulosin (FLOMAX) 0.4 MG CAPS capsule Take 0.4 mg by mouth daily.  1  . valsartan (DIOVAN) 320 MG tablet Take 1 tablet (320 mg total) by mouth daily. 90 tablet 3   No current facility-administered medications for this visit.     Allergies:   No known allergies    Social  History:  The patient  reports that he has quit smoking. His smoking use included Cigarettes. He has a 45.00 pack-year smoking history. He uses smokeless tobacco. He reports that he drinks about 7.2 oz of alcohol per week . He reports that he does not use drugs.   Family History:  The patient's family history includes Breast cancer in his mother and sister.    ROS:  Please see the history of present illness.   Otherwise, review of systems are positive for none.   All other systems are reviewed and negative.    PHYSICAL EXAM: VS:  BP 114/80 (BP Location: Left Arm, Patient Position: Sitting, Cuff Size: Normal)   Pulse 64   Ht 5' 9.5" (1.765 m)   Wt 208 lb (94.3 kg)   BMI 30.28 kg/m  , BMI Body mass index is 30.28 kg/m. GEN: Well nourished, well developed, in no acute distress  HEENT: normal  Neck: no JVD, carotid bruits, or masses Cardiac: RRR; no murmurs, rubs, or gallops,no edema  Respiratory:  clear to auscultation bilaterally, normal work of breathing GI: soft, nontender, nondistended, + BS MS: no deformity or atrophy  Skin: warm and dry, no rash Neuro:  Strength and sensation are intact Psych: euthymic mood, full affect  vascular: Radial pulses +2 bilaterally. Femoral pulses +2 on the right and +1 on the left. Posterior tibial is +2 on the right and 0 on the left. Dorsalis pedis is absent bilaterally.   EKG:  EKG is not ordered today.    Recent Labs: 01/19/2016: ALT 22; Brain Natriuretic Peptide 1,100.6 01/30/2016: Magnesium 1.9 03/22/2016: Hemoglobin 14.3; Platelets 216; TSH 7.64 09/06/2016: BUN 15; Creat 0.91; Potassium 4.9; Sodium 137    Lipid Panel    Component Value Date/Time   CHOL 158 01/19/2016 1029   TRIG 55 01/19/2016 1029   HDL 67 01/19/2016 1029   CHOLHDL 2.4 01/19/2016 1029   VLDL 11 01/19/2016 1029   LDLCALC 80 01/19/2016 1029      Wt Readings from Last 3 Encounters:  10/08/16 208 lb (94.3 kg)  10/03/16 210 lb (95.3 kg)  08/28/16 203 lb 9.6 oz (92.4  kg)       PAD Screen 10/08/2016  Previous PAD dx? No  Previous surgical procedure? No  Pain with walking? Yes  Subsides with rest? No  Feet/toe relief with dangling? No  Painful, non-healing ulcers? No  Extremities discolored? No      ASSESSMENT AND PLAN:  1.  Peripheral arterial disease with severe left calf claudication (Rutherford class III): The patient attempted exercising in cardiac rehabilitation but was not able to advance due to severity of his symptoms. He underwent noninvasive vascular evaluation which showed normal ABI on the right side and moderately reduced on the left at 0.61. Duplex showed an occluded distal left common femoral artery and short segment occlusion of the proximal and mid SFA. Given the severity of his symptoms and no improvement with rehabilitation, I recommend  proceeding with abdominal angiogram with lower extremity angiography and possible endovascular intervention. I explained to him that he might require surgical revascularization. Hold Xarelto 2 days before the procedure.  2. Chronic systolic heart failure: He appears to be euvolemic on current dose of furosemide.  3. Status post aortic valve replacement with bioprosthetic valve. He seems stable.   Disposition:   FU with me in 1 month  Signed,  Lorine Bears, MD  10/08/2016 11:13 AM    [facility]

## 2016-10-08 NOTE — Patient Instructions (Addendum)
Medication Instructions:  Your physician recommends that you continue on your current medications as directed. Please refer to the Current Medication list given to you today.  Labwork: Your physician recommends that you have lab work today: BMP, CBC and PT/INR  Testing/Procedures: Your physician has requested that you have a peripheral vascular angiogram. This exam is performed at the hospital. During this exam IV contrast is used to look at arterial blood flow. Please review the information sheet given for details.    [facility] CARDIOVASCULAR DIVISION [facility] [address] [place] [address] Dept: [phone] Loc: [phone]  [name]  10/08/2016  You are scheduled for a Peripheral Angiogram on Wednesday, April 18 with Dr. Lorine Bears.  1. Please arrive at the [facility] (Main Entrance A) at [facility]: [address] [place], [place] [address] at 6:30 AM (two hours before your procedure to ensure your preparation). Free valet parking service is available.   Special note: Every effort is made to have your procedure done on time. Please understand that emergencies sometimes delay scheduled procedures.  2. Diet: Do not eat or drink anything after midnight prior to your procedure except sips of water to take medications.  3. Labs: Pre-procedure labs will be drawn today.   4. Medication instructions in preparation for your procedure:  Stop taking Xarelto (Rivaroxaban) on Monday, April 16. You will take your last dosage on Sunday night.   On the morning of your procedure, take your Aspirin and any morning medicines NOT listed above.  You may use sips of water.  5. Plan for one night stay--bring personal belongings.  6. Bring a current list of your medications and current insurance cards.  7. You MUST have a responsible person to drive you home.  8. Someone MUST be with you the first 24 hours after you  arrive home or your discharge will be delayed.  9. Please wear clothes that are easy to get on and off and wear slip-on shoes.  Thank you for allowing [place] to care for you!   -- [facility]   Follow-Up: Your physician recommends that you schedule a follow-up appointment in: 1 MONTH with Dr Kirke Corin   Any Other Special Instructions Will Be Listed Below (If Applicable).     If you need a refill on your cardiac medications before your next appointment, please call your pharmacy.

## 2016-10-16 ENCOUNTER — Encounter (HOSPITAL_COMMUNITY)
Admission: RE | Disposition: A | Discharge status: Home / Self Care | Payer: Self-pay | Source: Ambulatory Visit | Attending: Cardiovascular Disease

## 2016-10-16 ENCOUNTER — Ambulatory Visit (HOSPITAL_COMMUNITY)
Admission: RE | Admit: 2016-10-16 | Discharge: 2016-10-16 | Disposition: A | Discharge status: Home / Self Care | Payer: BLUE CROSS/BLUE SHIELD | Source: Ambulatory Visit | Attending: Cardiovascular Disease | Admitting: Cardiovascular Disease

## 2016-10-16 DIAGNOSIS — Z7901 Long term (current) use of anticoagulants: Secondary | ICD-10-CM | POA: Diagnosis not present

## 2016-10-16 DIAGNOSIS — I70213 Atherosclerosis of native arteries of extremities with intermittent claudication, bilateral legs: Secondary | ICD-10-CM | POA: Diagnosis not present

## 2016-10-16 DIAGNOSIS — I70201 Unspecified atherosclerosis of native arteries of extremities, right leg: Secondary | ICD-10-CM | POA: Insufficient documentation

## 2016-10-16 DIAGNOSIS — J449 Chronic obstructive pulmonary disease, unspecified: Secondary | ICD-10-CM | POA: Insufficient documentation

## 2016-10-16 DIAGNOSIS — Z87891 Personal history of nicotine dependence: Secondary | ICD-10-CM | POA: Diagnosis not present

## 2016-10-16 DIAGNOSIS — Z7982 Long term (current) use of aspirin: Secondary | ICD-10-CM | POA: Diagnosis not present

## 2016-10-16 DIAGNOSIS — Z953 Presence of xenogenic heart valve: Secondary | ICD-10-CM | POA: Diagnosis not present

## 2016-10-16 DIAGNOSIS — Z8774 Personal history of (corrected) congenital malformations of heart and circulatory system: Secondary | ICD-10-CM | POA: Insufficient documentation

## 2016-10-16 DIAGNOSIS — I2510 Atherosclerotic heart disease of native coronary artery without angina pectoris: Secondary | ICD-10-CM | POA: Diagnosis not present

## 2016-10-16 DIAGNOSIS — I428 Other cardiomyopathies: Secondary | ICD-10-CM | POA: Insufficient documentation

## 2016-10-16 DIAGNOSIS — I4892 Unspecified atrial flutter: Secondary | ICD-10-CM | POA: Diagnosis not present

## 2016-10-16 DIAGNOSIS — I70212 Atherosclerosis of native arteries of extremities with intermittent claudication, left leg: Secondary | ICD-10-CM | POA: Diagnosis not present

## 2016-10-16 DIAGNOSIS — I739 Peripheral vascular disease, unspecified: Secondary | ICD-10-CM | POA: Diagnosis present

## 2016-10-16 DIAGNOSIS — I5022 Chronic systolic (congestive) heart failure: Secondary | ICD-10-CM | POA: Diagnosis not present

## 2016-10-16 HISTORY — PX: ABDOMINAL AORTOGRAM W/LOWER EXTREMITY: CATH118223

## 2016-10-16 SURGERY — ABDOMINAL AORTOGRAM W/LOWER EXTREMITY
Anesthesia: LOCAL

## 2016-10-16 MED ORDER — SODIUM CHLORIDE 0.9 % IV SOLN: 250.0000 mL | INTRAVENOUS | Status: DC | PRN

## 2016-10-16 MED ORDER — MIDAZOLAM HCL 2 MG/2ML IJ SOLN
INTRAMUSCULAR | Status: AC
  Filled 2016-10-16: qty 2

## 2016-10-16 MED ORDER — ASPIRIN 81 MG PO CHEW: 81.0000 mg | CHEWABLE_TABLET | ORAL | Status: DC

## 2016-10-16 MED ORDER — SODIUM CHLORIDE 0.9% FLUSH: 3.0000 mL | INTRAVENOUS | Status: DC | PRN

## 2016-10-16 MED ORDER — HEPARIN (PORCINE) IN NACL 2-0.9 UNIT/ML-% IJ SOLN
INTRAMUSCULAR | Status: DC | PRN
  Administered 2016-10-16: 1000 mL

## 2016-10-16 MED ORDER — LIDOCAINE HCL 1 % IJ SOLN
INTRAMUSCULAR | Status: AC
  Filled 2016-10-16: qty 20

## 2016-10-16 MED ORDER — FENTANYL CITRATE (PF) 100 MCG/2ML IJ SOLN
INTRAMUSCULAR | Status: AC
  Filled 2016-10-16: qty 2

## 2016-10-16 MED ORDER — SODIUM CHLORIDE 0.9 % IV SOLN: INTRAVENOUS | Status: AC

## 2016-10-16 MED ORDER — FENTANYL CITRATE (PF) 100 MCG/2ML IJ SOLN
INTRAMUSCULAR | Status: DC | PRN
Start: 2016-10-16 — End: 2016-10-16
  Administered 2016-10-16: 25 ug via INTRAVENOUS

## 2016-10-16 MED ORDER — LIDOCAINE HCL (PF) 1 % IJ SOLN
INTRAMUSCULAR | Status: DC | PRN
  Administered 2016-10-16: 11 mL via INTRADERMAL

## 2016-10-16 MED ORDER — MIDAZOLAM HCL 2 MG/2ML IJ SOLN
INTRAMUSCULAR | Status: DC | PRN
  Administered 2016-10-16: 1 mg via INTRAVENOUS

## 2016-10-16 MED ORDER — SODIUM CHLORIDE 0.9 % IV SOLN
INTRAVENOUS | Status: DC
  Administered 2016-10-16: 07:00:00 via INTRAVENOUS

## 2016-10-16 MED ORDER — HEPARIN (PORCINE) IN NACL 2-0.9 UNIT/ML-% IJ SOLN
INTRAMUSCULAR | Status: AC
Start: 2016-10-16 — End: 2016-10-16
  Filled 2016-10-16: qty 1000

## 2016-10-16 MED ORDER — IODIXANOL 320 MG/ML IV SOLN
INTRAVENOUS | Status: DC | PRN
  Administered 2016-10-16: 110 mL via INTRA_ARTERIAL

## 2016-10-16 MED ORDER — SODIUM CHLORIDE 0.9% FLUSH: 3.0000 mL | Freq: Two times a day (BID) | INTRAVENOUS | Status: DC

## 2016-10-16 SURGICAL SUPPLY — 14 items
CATH ANGIO 5F PIGTAIL 65CM (CATHETERS) ×2 IMPLANT
CATH CROSS OVER TEMPO 5F (CATHETERS) ×2 IMPLANT
CATH STRAIGHT 5FR 65CM (CATHETERS) ×2 IMPLANT
COVER PRB 48X5XTLSCP FOLD TPE (BAG) ×3 IMPLANT
COVER PROBE 5X48 (BAG) ×3
KIT MICROINTRODUCER STIFF 5F (SHEATH) ×2 IMPLANT
KIT PV (KITS) ×2 IMPLANT
SHEATH PINNACLE 5F 10CM (SHEATH) ×2 IMPLANT
STOPCOCK MORSE 400PSI 3WAY (MISCELLANEOUS) ×2 IMPLANT
SYRINGE MEDRAD AVANTA MACH 7 (SYRINGE) ×2 IMPLANT
TRANSDUCER W/STOPCOCK (MISCELLANEOUS) ×2 IMPLANT
TRAY PV CATH (CUSTOM PROCEDURE TRAY) ×2 IMPLANT
TUBING CIL FLEX 10 FLL-RA (TUBING) ×2 IMPLANT
WIRE HITORQ VERSACORE ST 145CM (WIRE) ×2 IMPLANT

## 2016-10-16 NOTE — Progress Notes (Addendum)
Site area: Right groin a 5 french arterial sheath was removed  Site Prior to Removal:  Level 0  Pressure Applied For 20 MINUTES    Bedrest Beginning at 1000am  Manual:   Yes.    Patient Status During Pull:  stable  Post Pull Groin Site:  Level 0  Post Pull Instructions Given:  Yes.    Post Pull Pulses Present:  Yes.    Dressing Applied:  Yes.    Pressure dressing applied  Comments:  VS remain stable during sheath pull.

## 2016-10-16 NOTE — Interval H&P Note (Signed)
History and Physical Interval Note:  10/16/2016 8:40 AM  [name]  has presented today for surgery, with the diagnosis of pvd  The various methods of treatment have been discussed with the patient and family. After consideration of risks, benefits and other options for treatment, the patient has consented to  Procedure(s): Abdominal Aortogram w/Lower Extremity (N/A) as a surgical intervention .  The patient's history has been reviewed, patient examined, no change in status, stable for surgery.  I have reviewed the patient's chart and labs.  Questions were answered to the patient's satisfaction.     Lorine Bears

## 2016-10-16 NOTE — Consult Note (Addendum)
 [facility] [place]  Patient name: [name] Record MRN: [id number] DOB: 01/18/1956 Sex: male   REFERRING PROVIDER:    Dr. Arida   REASON FOR CONSULT:    Left leg claudication  HISTORY OF PRESENT ILLNESS:   [name] is a 61 y.o. male who was referred By Dr. Hilty for evaluation and management of peripheral arterial disease. The patient has known history of chronic systolic heart failure due to nonischemic cardiomyopathy in the setting of severe aortic stenosis due to bicuspid aortic valve. He underwent successful aortic valve replacement in July 2017 with subsequent improvement in LV systolic function. He has other chronic medical conditions that include previous tobacco use. He quit smoking in June 2018. He is not diabetic. Family history is negative for coronary artery disease.  The patient is on anticoagulation with Xarelto for postoperative atrial flutter. The patient noted severe left calf pain with walking which started last year and was mostly noticeable when he tried to start cardiac rehabilitation after his valve surgery. He could not do treadmill at all. His symptoms gradually progressed and currently he is able to walk only about 50 yards before having to stop and rest. He usually has to rest for about 5 minutes before he can resume. This has significantly affected his ability to perform activities of daily living and also has affected his functioning at work. He works in construction.   He underwent angiography by Dr. Arida today and has distal CFA anf proximal SFA/PFA stenosis.  Femoral endarterectomy has been recommended.  He does not have any non-healing wounds.  He is a former smoker.  He is not on a statin.   PAST MEDICAL HISTORY    Past Medical History:  Diagnosis Date  . CAD (coronary artery disease) 01/23/2016   minor CAD at cath-40% RCA  . COPD (chronic obstructive pulmonary disease) (HCC)   .  Non-ischemic cardiomyopathy (HCC) 01/23/2016   EF 30-35%  . Severe aortic stenosis   . SOBOE (shortness of breath on exertion)      FAMILY HISTORY   Family History  Problem Relation Age of Onset  . Breast cancer Mother   . Breast cancer Sister     SOCIAL HISTORY:   Social History   Social History  . Marital status: Married    Spouse name: N/A  . Number of children: 4  . Years of education: 10   Occupational History  . construction     [employer]   Social History Main Topics  . Smoking status: Former Smoker    Packs/day: 1.00    Years: 45.00    Types: Cigarettes  . Smokeless tobacco: Current User  . Alcohol use 7.2 oz/week    12 Standard drinks or equivalent per week     Comment: beer  . Drug use: No  . Sexual activity: Not on file   Other Topics Concern  . Not on file   Social History Narrative  . No narrative on file    ALLERGIES:    No Known Allergies  CURRENT MEDICATIONS:    Current Facility-Administered Medications  Medication Dose Route Frequency Provider Last Rate Last Dose  . 0.9 %  sodium chloride infusion  250 mL Intravenous PRN Muhammad A Arida, MD      . 0.9 %  sodium chloride infusion   Intravenous Continuous Muhammad A Arida, MD 75 mL/hr at 10/16/16 0651    . 0.9 %  sodium chloride infusion   Intravenous Continuous Muhammad   A Arida, MD 75 mL/hr at 10/16/16 0933 75 mL/hr at 10/16/16 0933  . 0.9 %  sodium chloride infusion  250 mL Intravenous PRN Muhammad A Arida, MD      . aspirin chewable tablet 81 mg  81 mg Oral Pre-Cath Muhammad A Arida, MD   Stopped at 10/16/16 0651  . sodium chloride flush (NS) 0.9 % injection 3 mL  3 mL Intravenous Q12H Muhammad A Arida, MD      . sodium chloride flush (NS) 0.9 % injection 3 mL  3 mL Intravenous PRN Muhammad A Arida, MD      . sodium chloride flush (NS) 0.9 % injection 3 mL  3 mL Intravenous Q12H Muhammad A Arida, MD      . sodium chloride flush (NS) 0.9 % injection 3 mL  3 mL Intravenous  PRN Muhammad A Arida, MD        REVIEW OF SYSTEMS:   [X] denotes positive finding, [ ] denotes negative finding Cardiac  Comments:  Chest pain or chest pressure:    Shortness of breath upon exertion:    Short of breath when lying flat:    Irregular heart rhythm:        Vascular    Pain in calf, thigh, or hip brought on by ambulation: x   Pain in feet at night that wakes you up from your sleep:     Blood clot in your veins:    Leg swelling:         Pulmonary    Oxygen at home:    Productive cough:     Wheezing:         Neurologic    Sudden weakness in arms or legs:     Sudden numbness in arms or legs:     Sudden onset of difficulty speaking or slurred speech:    Temporary loss of vision in one eye:     Problems with dizziness:         Gastrointestinal    Blood in stool:      Vomited blood:         Genitourinary    Burning when urinating:     Blood in urine:        Psychiatric    Major depression:         Hematologic    Bleeding problems:    Problems with blood clotting too easily:        Skin    Rashes or ulcers:        Constitutional    Fever or chills:     PHYSICAL EXAM:   Vitals:   10/16/16 1045 10/16/16 1049 10/16/16 1100 10/16/16 1130  BP: (!) 164/93 (!) 146/78 (!) 159/92 (!) 161/88  Pulse: (!) 58 (!) 58 65 61  Resp:      Temp:      TempSrc:      SpO2: 97% 94% 90% 91%  Weight:      Height:        GENERAL: The patient is a well-nourished male, in no acute distress. The vital signs are documented above. CARDIAC: There is a regular rate and rhythm.  VASCULAR: non-palpable left pedal pulses PULMONARY: Nonlabored respirations ABDOMEN: Soft and non-tender with normal pitched bowel sounds.  MUSCULOSKELETAL: There are no major deformities or cyanosis. NEUROLOGIC: No focal weakness or paresthesias are detected. SKIN: There are no ulcers or rashes noted. PSYCHIATRIC: The patient has a normal affect.  STUDIES:   I have reviewed   his angiogram  which shows distal CFA stenosis and proximal SFA/PFa stenosis with moderate distal SFA disease  ASSESSMENT and PLAN   We discussed proceeding with left femoral endarterectomy with patch angioplasty.  He will need to be off anticoagulation.  He does not need a lovenox bridge (d/w Dr Arida)  We discussed the risks and benefits of the operation including but not limited to infection, wound healing, long term patency and potentially additional procedures to his distal SFA.  He wants  To get this done as soon as possible.  Statin therapy should be initiated   Wells Brabham, MD [facility] [place] Tel [phone] Pager [phone] 

## 2016-10-16 NOTE — Discharge Instructions (Signed)
Resume Xarelto tomorrow.   Angiogram, Care After This sheet gives you information about how to care for yourself after your procedure. Your doctor may also give you more specific instructions. If you have problems or questions, contact your doctor. Follow these instructions at home: Insertion site care   Follow instructions from your doctor about how to take care of your long, thin tube (catheter) insertion area. Make sure you:  Wash your hands with soap and water before you change your bandage (dressing). If you cannot use soap and water, use hand sanitizer.  Change your bandage as told by your doctor.  Leave stitches (sutures), skin glue, or skin tape (adhesive) strips in place. They may need to stay in place for 2 weeks or longer. If tape strips get loose and curl up, you may trim the loose edges. Do not remove tape strips completely unless your doctor says it is okay.  Do not take baths, swim, or use a hot tub until your doctor says it is okay.  You may shower 24-48 hours after the procedure or as told by your doctor.  Gently wash the area with plain soap and water.  Pat the area dry with a clean towel.  Do not rub the area. This may cause bleeding.  Do not apply powder or lotion to the area. Keep the area clean and dry.  Check your insertion area every day for signs of infection. Check for:  More redness, swelling, or pain.  Fluid or blood.  Warmth.  Pus or a bad smell. Activity   Rest as told by your doctor, usually for 1-2 days.  Do not lift anything that is heavier than 10 lbs. (4.5 kg) or as told by your doctor.  Do not drive for 24 hours if you were given a medicine to help you relax (sedative).  Do not drive or use heavy machinery while taking prescription pain medicine. General instructions   Go back to your normal activities as told by your doctor, usually in about a week. Ask your doctor what activities are safe for you.  If the insertion area starts to  bleed, lie flat and put pressure on the area. If the bleeding does not stop, get help right away. This is an emergency.  Drink enough fluid to keep your pee (urine) clear or pale yellow.  Take over-the-counter and prescription medicines only as told by your doctor.  Keep all follow-up visits as told by your doctor. This is important. Contact a doctor if:  You have a fever.  You have chills.  You have more redness, swelling, or pain around your insertion area.  You have fluid or blood coming from your insertion area.  The insertion area feels warm to the touch.  You have pus or a bad smell coming from your insertion area.  You have more bruising around the insertion area.  Blood collects in the tissue around the insertion area (hematoma) that may be painful to the touch. Get help right away if:  You have a lot of pain in the insertion area.  The insertion area swells very fast.  The insertion area is bleeding, and the bleeding does not stop after holding steady pressure on the area.  The area near or just beyond the insertion area becomes pale, cool, tingly, or numb. These symptoms may be an emergency. Do not wait to see if the symptoms will go away. Get medical help right away. Call your local emergency services (911 in the [place]  Do not drive yourself to the hospital. Summary  After the procedure, it is common to have bruising and tenderness at the long, thin tube insertion area.  After the procedure, it is important to rest and drink plenty of fluids.  Do not take baths, swim, or use a hot tub until your doctor says it is okay to do so. You may shower 24-48 hours after the procedure or as told by your doctor.  If the insertion area starts to bleed, lie flat and put pressure on the area. If the bleeding does not stop, get help right away. This is an emergency. This information is not intended to replace advice given to you by your health care provider. Make sure you  discuss any questions you have with your health care provider. Document Released: 09/13/2008 Document Revised: 06/11/2016 Document Reviewed: 06/11/2016 Elsevier Interactive Patient Education  2017 [employer].

## 2016-10-16 NOTE — H&P (View-Only) (Signed)
Cardiology Office Note   Date:  10/08/2016   ID:  [name], DOB 11-14-1955, MRN [id number]  PCP:  Nonnie Done., MD  Cardiologist:  Dr. Rennis Golden  Chief Complaint  Patient presents with  . New Patient (Initial Visit)    abnormal ale dopplers, evaluation for claudification      History of Present Illness: [name] is a 61 y.o. male who was referred By Dr. Rennis Golden for evaluation and management of peripheral arterial disease. The patient has known history of chronic systolic heart failure due to nonischemic cardiomyopathy in the setting of severe aortic stenosis due to bicuspid aortic valve. He underwent successful aortic valve replacement in July 2017 with subsequent improvement in LV systolic function. He has other chronic medical conditions that include previous tobacco use. He quit smoking in June 2018. He is not diabetic. Family history is negative for coronary artery disease. The patient is on anticoagulation with Xarelto for postoperative atrial flutter. The patient noted severe left calf pain with walking which started last year and was mostly noticeable when he tried to start cardiac rehabilitation after his valve surgery. He could not do treadmill at all. His symptoms gradually progressed and currently he is able to walk only about 50 yards before having to stop and rest. He usually has to rest for about 5 minutes before he can resume. This has significantly affected his ability to perform activities of daily living and also has affected his functioning at work. He works in [employer].     Past Medical History:  Diagnosis Date  . CAD (coronary artery disease) 01/23/2016   minor CAD at cath-40% RCA  . COPD (chronic obstructive pulmonary disease) (HCC)   . Non-ischemic cardiomyopathy (HCC) 01/23/2016   EF 30-35%  . Severe aortic stenosis   . SOBOE (shortness of breath on exertion)     Past Surgical History:  Procedure Laterality Date  . AORTIC VALVE REPLACEMENT  N/A 01/29/2016   Procedure: AORTIC VALVE REPLACEMENT (AVR) with Magna Ease size 25mm;  Surgeon: [name], MD;  [facility];  Service: Open Heart Surgery;  Laterality: N/A;  . CARDIAC CATHETERIZATION N/A 01/23/2016   Procedure: Right/Left Heart Cath and Coronary Angiography;  Surgeon: Chrystie Nose, MD;  Location: [facility] INVASIVE CV LAB;  Service: Cardiovascular;  Laterality: N/A;  . CARDIOVERSION N/A 03/26/2016   Procedure: CARDIOVERSION;  Surgeon: Chrystie Nose, MD;  Location: [facility] ENDOSCOPY;  Service: Cardiovascular;  Laterality: N/A;  . CATARACT EXTRACTION  12/2002 & 09/2007  . SHOULDER SURGERY Right 1971  . TEE WITHOUT CARDIOVERSION N/A 01/29/2016   Procedure: TRANSESOPHAGEAL ECHOCARDIOGRAM (TEE);  Surgeon: [name], MD;  Location: [facility] OR;  Service: Open Heart Surgery;  Laterality: N/A;     Current Outpatient Prescriptions  Medication Sig Dispense Refill  . aspirin 81 MG tablet Take 1 tablet (81 mg total) by mouth daily.    Marland Kitchen BREO ELLIPTA 100-25 MCG/INH AEPB USE 1 INHALATION DAILY  0  . carvedilol (COREG) 3.125 MG tablet Take 1 tablet (3.125 mg total) by mouth 2 (two) times daily with a meal. 180 tablet 3  . finasteride (PROSCAR) 5 MG tablet Take 1 tablet (5 mg total) by mouth daily. 30 tablet 1  . furosemide (LASIX) 40 MG tablet Take 1 tablet (40 mg total) by mouth daily. 90 tablet 3  . potassium chloride SA (K-DUR,KLOR-CON) 20 MEQ tablet Take 1 tablet (20 mEq total) by mouth daily. 90 tablet 3  . PROAIR RESPICLICK 108 (90 Base)  MCG/ACT AEPB Inhale 2 puffs into the lungs as directed.  3  . rivaroxaban (XARELTO) 20 MG TABS tablet Take 1 tablet (20 mg total) by mouth daily with supper. 30 tablet 6  . tamsulosin (FLOMAX) 0.4 MG CAPS capsule Take 0.4 mg by mouth daily.  1  . valsartan (DIOVAN) 320 MG tablet Take 1 tablet (320 mg total) by mouth daily. 90 tablet 3   No current facility-administered medications for this visit.     Allergies:   No known allergies    Social  History:  The patient  reports that he has quit smoking. His smoking use included Cigarettes. He has a 45.00 pack-year smoking history. He uses smokeless tobacco. He reports that he drinks about 7.2 oz of alcohol per week . He reports that he does not use drugs.   Family History:  The patient's family history includes Breast cancer in his mother and sister.    ROS:  Please see the history of present illness.   Otherwise, review of systems are positive for none.   All other systems are reviewed and negative.    PHYSICAL EXAM: VS:  BP 114/80 (BP Location: Left Arm, Patient Position: Sitting, Cuff Size: Normal)   Pulse 64   Ht 5' 9.5" (1.765 m)   Wt 208 lb (94.3 kg)   BMI 30.28 kg/m  , BMI Body mass index is 30.28 kg/m. GEN: Well nourished, well developed, in no acute distress  HEENT: normal  Neck: no JVD, carotid bruits, or masses Cardiac: RRR; no murmurs, rubs, or gallops,no edema  Respiratory:  clear to auscultation bilaterally, normal work of breathing GI: soft, nontender, nondistended, + BS MS: no deformity or atrophy  Skin: warm and dry, no rash Neuro:  Strength and sensation are intact Psych: euthymic mood, full affect  vascular: Radial pulses +2 bilaterally. Femoral pulses +2 on the right and +1 on the left. Posterior tibial is +2 on the right and 0 on the left. Dorsalis pedis is absent bilaterally.   EKG:  EKG is not ordered today.    Recent Labs: 01/19/2016: ALT 22; Brain Natriuretic Peptide 1,100.6 01/30/2016: Magnesium 1.9 03/22/2016: Hemoglobin 14.3; Platelets 216; TSH 7.64 09/06/2016: BUN 15; Creat 0.91; Potassium 4.9; Sodium 137    Lipid Panel    Component Value Date/Time   CHOL 158 01/19/2016 1029   TRIG 55 01/19/2016 1029   HDL 67 01/19/2016 1029   CHOLHDL 2.4 01/19/2016 1029   VLDL 11 01/19/2016 1029   LDLCALC 80 01/19/2016 1029      Wt Readings from Last 3 Encounters:  10/08/16 208 lb (94.3 kg)  10/03/16 210 lb (95.3 kg)  08/28/16 203 lb 9.6 oz (92.4  kg)       PAD Screen 10/08/2016  Previous PAD dx? No  Previous surgical procedure? No  Pain with walking? Yes  Subsides with rest? No  Feet/toe relief with dangling? No  Painful, non-healing ulcers? No  Extremities discolored? No      ASSESSMENT AND PLAN:  1.  Peripheral arterial disease with severe left calf claudication (Rutherford class III): The patient attempted exercising in cardiac rehabilitation but was not able to advance due to severity of his symptoms. He underwent noninvasive vascular evaluation which showed normal ABI on the right side and moderately reduced on the left at 0.61. Duplex showed an occluded distal left common femoral artery and short segment occlusion of the proximal and mid SFA. Given the severity of his symptoms and no improvement with rehabilitation, I recommend  proceeding with abdominal angiogram with lower extremity angiography and possible endovascular intervention. I explained to him that he might require surgical revascularization. Hold Xarelto 2 days before the procedure.  2. Chronic systolic heart failure: He appears to be euvolemic on current dose of furosemide.  3. Status post aortic valve replacement with bioprosthetic valve. He seems stable.   Disposition:   FU with me in 1 month  Signed,  Lorine Bears, MD  10/08/2016 11:13 AM    [facility]

## 2016-10-17 ENCOUNTER — Encounter (HOSPITAL_COMMUNITY): Payer: Self-pay | Admitting: Cardiovascular Disease

## 2016-10-17 ENCOUNTER — Other Ambulatory Visit: Payer: Self-pay

## 2016-10-24 NOTE — Pre-Procedure Instructions (Signed)
[name]  10/24/2016      [id number] - [place], [place] - [address] [facility] [place] [address] Phone: [phone] Fax: [phone]    Your procedure is scheduled on May 4  Report to [facility] Admitting at 0800 A.M.  Call this number if you have problems the morning of surgery:  [phone]   Remember:  Do not eat food or drink liquids after midnight.   Take these medicines the morning of surgery with A SIP OF WATER BREO ELLIPTA, carvedilol (COREG, finasteride (PROSCAR), potassium chloride, PROAIR RESPICLICK, tamsulosin (FLOMAX)   FOLLOW PHYSICIANS INSTRUCTIONS ABOUT XARELTO  7 days prior to surgery STOP taking any Aspirin, Aleve, Naproxen, Ibuprofen, Motrin, Advil, Goody's, BC's, all herbal medications, fish oil, and all vitamins    Do not wear jewelry  Do not wear lotions, powders, or cologne, or deoderant.   Men may shave face and neck.  Do not bring valuables to the hospital.  [facility] is not responsible for any belongings or valuables.  Contacts, dentures or bridgework may not be worn into surgery.  Leave your suitcase in the car.  After surgery it may be brought to your room.  For patients admitted to the hospital, discharge time will be determined by your treatment team.  Patients discharged the day of surgery will not be allowed to drive home.    Special instructions:   [place]- Preparing For Surgery  Before surgery, you can play an important role. Because skin is not sterile, your skin needs to be as free of germs as possible. You can reduce the number of germs on your skin by washing with CHG (chlorahexidine gluconate) Soap before surgery.  CHG is an antiseptic cleaner which kills germs and bonds with the skin to continue killing germs even after washing.  Please do not use if you have an allergy to CHG or antibacterial soaps. If your skin becomes reddened/irritated stop using the CHG.  Do not shave  (including legs and underarms) for at least 48 hours prior to first CHG shower. It is OK to shave your face.  Please follow these instructions carefully.   1. Shower the NIGHT BEFORE SURGERY and the MORNING OF SURGERY with CHG.   2. If you chose to wash your hair, wash your hair first as usual with your normal shampoo.  3. After you shampoo, rinse your hair and body thoroughly to remove the shampoo.  4. Use CHG as you would any other liquid soap. You can apply CHG directly to the skin and wash gently with a scrungie or a clean washcloth.   5. Apply the CHG Soap to your body ONLY FROM THE NECK DOWN.  Do not use on open wounds or open sores. Avoid contact with your eyes, ears, mouth and genitals (private parts). Wash genitals (private parts) with your normal soap.  6. Wash thoroughly, paying special attention to the area where your surgery will be performed.  7. Thoroughly rinse your body with warm water from the neck down.  8. DO NOT shower/wash with your normal soap after using and rinsing off the CHG Soap.  9. Pat yourself dry with a CLEAN TOWEL.   10. Wear CLEAN PAJAMAS   11. Place CLEAN SHEETS on your bed the night of your first shower and DO NOT SLEEP WITH PETS.    Day of Surgery: Do not apply any deodorants/lotions. Please wear clean clothes to the hospital/surgery center.  Please read over the following fact sheets that you were given.

## 2016-10-25 ENCOUNTER — Encounter (HOSPITAL_COMMUNITY): Payer: Self-pay

## 2016-10-25 ENCOUNTER — Encounter (HOSPITAL_COMMUNITY)
Admission: RE | Admit: 2016-10-25 | Discharge: 2016-10-25 | Disposition: A | Discharge status: Home / Self Care | Payer: BLUE CROSS/BLUE SHIELD | Source: Ambulatory Visit | Attending: Surgery | Admitting: Surgery

## 2016-10-25 DIAGNOSIS — I428 Other cardiomyopathies: Secondary | ICD-10-CM | POA: Diagnosis not present

## 2016-10-25 DIAGNOSIS — Z79899 Other long term (current) drug therapy: Secondary | ICD-10-CM | POA: Insufficient documentation

## 2016-10-25 DIAGNOSIS — I509 Heart failure, unspecified: Secondary | ICD-10-CM | POA: Insufficient documentation

## 2016-10-25 DIAGNOSIS — J449 Chronic obstructive pulmonary disease, unspecified: Secondary | ICD-10-CM | POA: Insufficient documentation

## 2016-10-25 DIAGNOSIS — Z0183 Encounter for blood typing: Secondary | ICD-10-CM | POA: Diagnosis not present

## 2016-10-25 DIAGNOSIS — Z01812 Encounter for preprocedural laboratory examination: Secondary | ICD-10-CM | POA: Insufficient documentation

## 2016-10-25 DIAGNOSIS — Z87891 Personal history of nicotine dependence: Secondary | ICD-10-CM | POA: Insufficient documentation

## 2016-10-25 DIAGNOSIS — I771 Stricture of artery: Secondary | ICD-10-CM | POA: Insufficient documentation

## 2016-10-25 DIAGNOSIS — I4891 Unspecified atrial fibrillation: Secondary | ICD-10-CM | POA: Diagnosis not present

## 2016-10-25 DIAGNOSIS — Z7901 Long term (current) use of anticoagulants: Secondary | ICD-10-CM | POA: Insufficient documentation

## 2016-10-25 DIAGNOSIS — I110 Hypertensive heart disease with heart failure: Secondary | ICD-10-CM | POA: Insufficient documentation

## 2016-10-25 DIAGNOSIS — Z7982 Long term (current) use of aspirin: Secondary | ICD-10-CM | POA: Diagnosis not present

## 2016-10-25 DIAGNOSIS — I2510 Atherosclerotic heart disease of native coronary artery without angina pectoris: Secondary | ICD-10-CM | POA: Diagnosis not present

## 2016-10-25 DIAGNOSIS — M79605 Pain in left leg: Secondary | ICD-10-CM | POA: Diagnosis not present

## 2016-10-25 HISTORY — DX: Unspecified asthma, uncomplicated: J45.909

## 2016-10-25 HISTORY — DX: Heart failure, unspecified: I50.9

## 2016-10-25 HISTORY — DX: Essential (primary) hypertension: I10

## 2016-10-25 HISTORY — DX: Pneumonia, unspecified organism: J18.9

## 2016-10-25 HISTORY — DX: Cardiac arrhythmia, unspecified: I49.9

## 2016-10-25 HISTORY — DX: Cardiac murmur, unspecified: R01.1

## 2016-10-25 HISTORY — DX: Dyspnea, unspecified: R06.00

## 2016-10-25 LAB — COMPREHENSIVE METABOLIC PANEL
ALBUMIN: 4 g/dL (ref 3.5–5.0)
ALT: 36 U/L (ref 17–63)
ANION GAP: 10 (ref 5–15)
AST: 33 U/L (ref 15–41)
Alkaline Phosphatase: 52 U/L (ref 38–126)
BUN: 11 mg/dL (ref 6–20)
CALCIUM: 9.2 mg/dL (ref 8.9–10.3)
CHLORIDE: 103 mmol/L (ref 101–111)
CO2: 25 mmol/L (ref 22–32)
Creatinine, Ser: 0.89 mg/dL (ref 0.61–1.24)
GFR calc non Af Amer: >60 mL/min (ref >60)
GLUCOSE: 110 mg/dL — AB (ref 65–99)
POTASSIUM: 4.8 mmol/L (ref 3.5–5.1)
SODIUM: 138 mmol/L (ref 135–145)
Total Bilirubin: 0.6 mg/dL (ref 0.3–1.2)
Total Protein: 6.7 g/dL (ref 6.5–8.1)

## 2016-10-25 LAB — CBC
HCT: 46.9 % (ref 39.0–52.0)
HEMOGLOBIN: 16.3 g/dL (ref 13.0–17.0)
MCH: 33 pg (ref 26.0–34.0)
MCHC: 34.8 g/dL (ref 30.0–36.0)
MCV: 94.9 fL (ref 78.0–100.0)
PLATELETS: 158 10*3/uL (ref 150–400)
RBC: 4.94 MIL/uL (ref 4.22–5.81)
RDW: 13.3 % (ref 11.5–15.5)
WBC: 4.5 10*3/uL (ref 4.0–10.5)

## 2016-10-25 LAB — TYPE AND SCREEN
ABO/RH(D): O POS
ANTIBODY SCREEN: NEGATIVE

## 2016-10-25 LAB — URINALYSIS, ROUTINE W REFLEX MICROSCOPIC
BILIRUBIN URINE: NEGATIVE
Glucose, UA: NEGATIVE mg/dL
Hgb urine dipstick: NEGATIVE
KETONES UR: NEGATIVE mg/dL
LEUKOCYTES UA: NEGATIVE
NITRITE: NEGATIVE
PH: 6 (ref 5.0–8.0)
PROTEIN: NEGATIVE mg/dL
Specific Gravity, Urine: 1.004 — ABNORMAL LOW (ref 1.005–1.030)

## 2016-10-25 LAB — SURGICAL PCR SCREEN
MRSA, PCR: NEGATIVE
STAPHYLOCOCCUS AUREUS: NEGATIVE

## 2016-10-25 NOTE — Progress Notes (Addendum)
PCP - Cheri Rous Cardiologist - Hilty  Chest x-ray - 02/28/16 EKG - 08/28/16 Stress Test - denies ECHO - 08/12/16 Cardiac Cath - 01/23/16, 10/16/16  Sleep Study - has one scheduled for 11/08/16   Last dose of xarelto to be 10/28/16 No instructions for aspirin. Dr. Estanislado Spire office called regarding aspirin will notify patient when those instructions are received patient is aware.   Sending to anesthesia for review due to cardiac history  11:35 AM Patient to stop Aspirin 7 days prior to surgery per Dr. Rennis Golden patient verbalized understanding   Patient denies shortness of breath, fever, cough and chest pain at PAT appointment   Patient verbalized understanding of instructions that was given to them at the PAT appointment. Patient expressed that there were no further questions.  Patient was also instructed that they will need to review over the PAT instructions again at home before the surgery.

## 2016-10-28 NOTE — Progress Notes (Addendum)
Anesthesia Chart Review:  Pt is a 61 year old male scheduled for L femoral endarterectomy, patch angioplasty on 11/01/2016 with Coral Else, M.D.  - PCP is Cheri Rous, MD - Cardiologist is Zoila Shutter, MD who referred pt for claudication work up after last office visit 10/03/16  PMH includes: CAD, non-ischemic cardiomyopathy, CHF, atrial fibrillation, HTN, severe aortic stenosis (a/P aVR 01/29/16), COPD, asthma. From her smoker. BMI 31.  Medications include: ASA 81 mg, Breo Ellipta, carvedilol, Lasix, potassium, xarelto, valsartan. Pt to hold ASA 7 days before surgery. Last dose xarelto 10/28/16.   Preoperative labs reviewed.  PT/PTT will be obtained DOS.   CXR 02/28/16:  1. Near complete resolution of left pleural effusion. 2. Residual elevation of left hemidiaphragm and minimal left basilar atelectasis. 3. No other acute cardiopulmonary disease. 4. Atherosclerosis of the thoracic aorta.  EKG 08/28/16: NSR. ST and T-wave abnormality, consider lateral ischemia.  Echo 08/12/16:  - Left ventricle: The cavity size was mildly dilated. Wall thickness was increased in a pattern of mild LVH. Systolic function was mildly reduced. The estimated ejection fraction was in the range of 45% to 50%. Doppler parameters are consistent with abnormal left ventricular relaxation (grade 1 diastolic dysfunction). - Aortic valve: AV prosthesis opens well Peak nad mean gradients through the valve are 23 and 12 mm Hg respectively. There was trivial regurgitation. - Mitral valve: MV is mildly thickened. There is intermittent prolapse of posterior mitral leaflet Trace MR. - Left atrium: The atrium was moderately dilated. - Right atrium: The atrium was mildly dilated.  Carotid duplex 01/25/16:  1. Carotid Duplex Evaluation - Mild intimal thickening noted in bilateral carotid arteries without evidence of a significant stenosis - 1-39%. 2. Palmar Arch Evaluation - Doppler waveforms remain within normal limits bilateral  radial and ulnar arteries with compressions.  Cardiac cath 01/23/16:   Mid RCA lesion, 40 %stenosed.  Prox RCA lesion, 10 %stenosed.  Hemodynamic findings consistent with moderate pulmonary hypertension, mitral valve regurgitation and aortic valve stenosis.  If PT/PTT acceptable DOS, I anticipate pt can proceed as scheduled.   Rica Mast, FNP-BC [facility]/Anesthesiology Phone: [phone] 10/28/2016 3:34 PM

## 2016-11-01 ENCOUNTER — Inpatient Hospital Stay (HOSPITAL_COMMUNITY)
Admission: RE | Admit: 2016-11-01 | Discharge: 2016-11-02 | DRG: 271 | Disposition: A | Discharge status: Home / Self Care | Payer: BLUE CROSS/BLUE SHIELD | Source: Ambulatory Visit | Attending: Surgery | Admitting: Surgery

## 2016-11-01 ENCOUNTER — Inpatient Hospital Stay (HOSPITAL_COMMUNITY): Payer: BLUE CROSS/BLUE SHIELD | Admitting: Certified Registered Nurse Anesthetist

## 2016-11-01 ENCOUNTER — Encounter (HOSPITAL_COMMUNITY): Payer: Self-pay | Admitting: *Deleted

## 2016-11-01 ENCOUNTER — Encounter (HOSPITAL_COMMUNITY)
Admission: RE | Disposition: A | Discharge status: Home / Self Care | Payer: Self-pay | Source: Ambulatory Visit | Attending: Surgery

## 2016-11-01 ENCOUNTER — Inpatient Hospital Stay (HOSPITAL_COMMUNITY): Payer: BLUE CROSS/BLUE SHIELD | Admitting: Anesthesiology

## 2016-11-01 DIAGNOSIS — I110 Hypertensive heart disease with heart failure: Secondary | ICD-10-CM | POA: Diagnosis present

## 2016-11-01 DIAGNOSIS — I70212 Atherosclerosis of native arteries of extremities with intermittent claudication, left leg: Secondary | ICD-10-CM

## 2016-11-01 DIAGNOSIS — I429 Cardiomyopathy, unspecified: Secondary | ICD-10-CM | POA: Diagnosis present

## 2016-11-01 DIAGNOSIS — I5022 Chronic systolic (congestive) heart failure: Secondary | ICD-10-CM | POA: Diagnosis present

## 2016-11-01 DIAGNOSIS — I739 Peripheral vascular disease, unspecified: Secondary | ICD-10-CM | POA: Diagnosis present

## 2016-11-01 DIAGNOSIS — Z87891 Personal history of nicotine dependence: Secondary | ICD-10-CM | POA: Diagnosis not present

## 2016-11-01 DIAGNOSIS — I70202 Unspecified atherosclerosis of native arteries of extremities, left leg: Secondary | ICD-10-CM | POA: Diagnosis present

## 2016-11-01 DIAGNOSIS — Z952 Presence of prosthetic heart valve: Secondary | ICD-10-CM | POA: Diagnosis not present

## 2016-11-01 DIAGNOSIS — J449 Chronic obstructive pulmonary disease, unspecified: Secondary | ICD-10-CM | POA: Diagnosis present

## 2016-11-01 DIAGNOSIS — I2510 Atherosclerotic heart disease of native coronary artery without angina pectoris: Secondary | ICD-10-CM | POA: Diagnosis present

## 2016-11-01 DIAGNOSIS — I70292 Other atherosclerosis of native arteries of extremities, left leg: Secondary | ICD-10-CM | POA: Diagnosis present

## 2016-11-01 HISTORY — PX: ENDARTERECTOMY FEMORAL: SHX5804

## 2016-11-01 HISTORY — PX: PATCH ANGIOPLASTY: SHX6230

## 2016-11-01 LAB — PROTIME-INR
INR: 1.08
PROTHROMBIN TIME: 14 s (ref 11.4–15.2)

## 2016-11-01 LAB — CREATININE, SERUM
CREATININE: 0.79 mg/dL (ref 0.61–1.24)
GFR calc Af Amer: >60 mL/min (ref >60)
GFR calc non Af Amer: >60 mL/min (ref >60)

## 2016-11-01 LAB — CBC
HEMATOCRIT: 44.2 % (ref 39.0–52.0)
HEMOGLOBIN: 15.2 g/dL (ref 13.0–17.0)
MCH: 33.1 pg (ref 26.0–34.0)
MCHC: 34.4 g/dL (ref 30.0–36.0)
MCV: 96.3 fL (ref 78.0–100.0)
Platelets: 136 10*3/uL — ABNORMAL LOW (ref 150–400)
RBC: 4.59 MIL/uL (ref 4.22–5.81)
RDW: 13.5 % (ref 11.5–15.5)
WBC: 7.4 10*3/uL (ref 4.0–10.5)

## 2016-11-01 LAB — APTT: aPTT: 33 seconds (ref 24–36)

## 2016-11-01 SURGERY — ENDARTERECTOMY, FEMORAL
Anesthesia: General | Site: Groin | Laterality: Left

## 2016-11-01 MED ORDER — METOPROLOL TARTRATE 5 MG/5ML IV SOLN: 2.0000 mg | INTRAVENOUS | Status: DC | PRN

## 2016-11-01 MED ORDER — PHENYLEPHRINE HCL 10 MG/ML IJ SOLN
INTRAMUSCULAR | Status: DC | PRN
  Administered 2016-11-01 (×4): 80 ug via INTRAVENOUS

## 2016-11-01 MED ORDER — LACTATED RINGERS IV SOLN
INTRAVENOUS | Status: DC | PRN
  Administered 2016-11-01 (×2): via INTRAVENOUS

## 2016-11-01 MED ORDER — ATORVASTATIN CALCIUM 10 MG PO TABS: 10.0000 mg | ORAL_TABLET | Freq: Every day | ORAL | 11 refills | Status: DC

## 2016-11-01 MED ORDER — MORPHINE SULFATE (PF) 4 MG/ML IV SOLN: 2.0000 mg | INTRAVENOUS | Status: DC | PRN

## 2016-11-01 MED ORDER — TAMSULOSIN HCL 0.4 MG PO CAPS
0.4000 mg | ORAL_CAPSULE | Freq: Every day | ORAL | Status: DC
  Administered 2016-11-02: 0.4 mg via ORAL
  Filled 2016-11-01: qty 1

## 2016-11-01 MED ORDER — LABETALOL HCL 5 MG/ML IV SOLN: 10.0000 mg | INTRAVENOUS | Status: DC | PRN

## 2016-11-01 MED ORDER — CHLORHEXIDINE GLUCONATE CLOTH 2 % EX PADS: 6.0000 | MEDICATED_PAD | Freq: Once | CUTANEOUS | Status: DC

## 2016-11-01 MED ORDER — FINASTERIDE 5 MG PO TABS
5.0000 mg | ORAL_TABLET | Freq: Every day | ORAL | Status: DC
  Administered 2016-11-02: 5 mg via ORAL
  Filled 2016-11-01: qty 1

## 2016-11-01 MED ORDER — FUROSEMIDE 40 MG PO TABS
40.0000 mg | ORAL_TABLET | Freq: Every day | ORAL | Status: DC
  Administered 2016-11-02: 40 mg via ORAL
  Filled 2016-11-01 (×2): qty 1

## 2016-11-01 MED ORDER — OXYCODONE-ACETAMINOPHEN 5-325 MG PO TABS
1.0000 | ORAL_TABLET | ORAL | Status: DC | PRN
  Administered 2016-11-01 – 2016-11-02 (×2): 2 via ORAL
  Filled 2016-11-01 (×2): qty 2

## 2016-11-01 MED ORDER — PROPOFOL 10 MG/ML IV BOLUS
INTRAVENOUS | Status: AC
  Filled 2016-11-01: qty 20

## 2016-11-01 MED ORDER — OXYCODONE HCL 5 MG/5ML PO SOLN: 5.0000 mg | Freq: Once | ORAL | Status: DC | PRN

## 2016-11-01 MED ORDER — POTASSIUM CHLORIDE CRYS ER 20 MEQ PO TBCR
20.0000 meq | EXTENDED_RELEASE_TABLET | Freq: Every day | ORAL | Status: DC
  Administered 2016-11-02: 20 meq via ORAL
  Filled 2016-11-01: qty 1

## 2016-11-01 MED ORDER — ROCURONIUM BROMIDE 100 MG/10ML IV SOLN
INTRAVENOUS | Status: DC | PRN
  Administered 2016-11-01 (×2): 10 mg via INTRAVENOUS
  Administered 2016-11-01: 20 mg via INTRAVENOUS
  Administered 2016-11-01: 10 mg via INTRAVENOUS
  Administered 2016-11-01: 20 mg via INTRAVENOUS
  Administered 2016-11-01: 50 mg via INTRAVENOUS

## 2016-11-01 MED ORDER — FENTANYL CITRATE (PF) 100 MCG/2ML IJ SOLN
INTRAMUSCULAR | Status: AC
  Administered 2016-11-01: 50 ug via INTRAVENOUS
  Filled 2016-11-01: qty 2

## 2016-11-01 MED ORDER — MIDAZOLAM HCL 5 MG/5ML IJ SOLN
INTRAMUSCULAR | Status: DC | PRN
  Administered 2016-11-01: .5 mg via INTRAVENOUS
  Administered 2016-11-01: 1 mg via INTRAVENOUS

## 2016-11-01 MED ORDER — SODIUM CHLORIDE 0.9 % IV SOLN
INTRAVENOUS | Status: DC | PRN
  Administered 2016-11-01: 12:00:00 500 mL

## 2016-11-01 MED ORDER — ACETAMINOPHEN 650 MG RE SUPP: 325.0000 mg | RECTAL | Status: DC | PRN

## 2016-11-01 MED ORDER — GUAIFENESIN-DM 100-10 MG/5ML PO SYRP: 15.0000 mL | ORAL_SOLUTION | ORAL | Status: DC | PRN

## 2016-11-01 MED ORDER — FENTANYL CITRATE (PF) 100 MCG/2ML IJ SOLN
INTRAMUSCULAR | Status: DC | PRN
  Administered 2016-11-01 (×5): 50 ug via INTRAVENOUS

## 2016-11-01 MED ORDER — DEXTROSE 5 % IV SOLN
1.5000 g | INTRAVENOUS | Status: AC
  Administered 2016-11-01: 1.5 g via INTRAVENOUS
  Filled 2016-11-01: qty 1.5

## 2016-11-01 MED ORDER — PANTOPRAZOLE SODIUM 40 MG PO TBEC
40.0000 mg | DELAYED_RELEASE_TABLET | Freq: Every day | ORAL | Status: DC
  Administered 2016-11-01 – 2016-11-02 (×2): 40 mg via ORAL
  Filled 2016-11-01 (×2): qty 1

## 2016-11-01 MED ORDER — SODIUM CHLORIDE 0.9 % IV SOLN: INTRAVENOUS | Status: DC

## 2016-11-01 MED ORDER — FENTANYL CITRATE (PF) 250 MCG/5ML IJ SOLN
INTRAMUSCULAR | Status: AC
  Filled 2016-11-01: qty 5

## 2016-11-01 MED ORDER — SUGAMMADEX SODIUM 200 MG/2ML IV SOLN
INTRAVENOUS | Status: DC | PRN
  Administered 2016-11-01: 200 mg via INTRAVENOUS

## 2016-11-01 MED ORDER — ONDANSETRON HCL 4 MG/2ML IJ SOLN: 4.0000 mg | Freq: Four times a day (QID) | INTRAMUSCULAR | Status: DC | PRN

## 2016-11-01 MED ORDER — ALBUTEROL SULFATE 108 (90 BASE) MCG/ACT IN AEPB: 2.0000 | INHALATION_SPRAY | Freq: Four times a day (QID) | RESPIRATORY_TRACT | Status: DC | PRN

## 2016-11-01 MED ORDER — PHENOL 1.4 % MT LIQD: 1.0000 | OROMUCOSAL | Status: DC | PRN

## 2016-11-01 MED ORDER — OXYCODONE HCL 5 MG PO TABS: 5.0000 mg | ORAL_TABLET | Freq: Once | ORAL | Status: DC | PRN

## 2016-11-01 MED ORDER — FENTANYL CITRATE (PF) 100 MCG/2ML IJ SOLN
25.0000 ug | INTRAMUSCULAR | Status: DC | PRN
  Administered 2016-11-01 (×2): 50 ug via INTRAVENOUS

## 2016-11-01 MED ORDER — ONDANSETRON HCL 4 MG/2ML IJ SOLN: 4.0000 mg | Freq: Once | INTRAMUSCULAR | Status: DC | PRN

## 2016-11-01 MED ORDER — ASPIRIN 81 MG PO TABS: 81.0000 mg | ORAL_TABLET | Freq: Every day | ORAL | Status: DC

## 2016-11-01 MED ORDER — ALUM & MAG HYDROXIDE-SIMETH 200-200-20 MG/5ML PO SUSP: 15.0000 mL | ORAL | Status: DC | PRN

## 2016-11-01 MED ORDER — DOCUSATE SODIUM 100 MG PO CAPS
100.0000 mg | ORAL_CAPSULE | Freq: Every day | ORAL | Status: DC
  Administered 2016-11-02: 100 mg via ORAL
  Filled 2016-11-01: qty 1

## 2016-11-01 MED ORDER — SODIUM CHLORIDE 0.9 % IV SOLN: 500.0000 mL | Freq: Once | INTRAVENOUS | Status: DC | PRN

## 2016-11-01 MED ORDER — FLUTICASONE FUROATE-VILANTEROL 100-25 MCG/INH IN AEPB
1.0000 | INHALATION_SPRAY | Freq: Every day | RESPIRATORY_TRACT | Status: DC
  Filled 2016-11-01 (×2): qty 28

## 2016-11-01 MED ORDER — HEPARIN SODIUM (PORCINE) 1000 UNIT/ML IJ SOLN
INTRAMUSCULAR | Status: DC | PRN
  Administered 2016-11-01: 9000 [IU] via INTRAVENOUS

## 2016-11-01 MED ORDER — ASPIRIN EC 81 MG PO TBEC
81.0000 mg | DELAYED_RELEASE_TABLET | Freq: Every day | ORAL | Status: DC
  Administered 2016-11-02: 81 mg via ORAL
  Filled 2016-11-01: qty 1

## 2016-11-01 MED ORDER — ENOXAPARIN SODIUM 30 MG/0.3ML ~~LOC~~ SOLN: 30.0000 mg | SUBCUTANEOUS | Status: DC

## 2016-11-01 MED ORDER — BISACODYL 10 MG RE SUPP
10.0000 mg | Freq: Every day | RECTAL | Status: DC | PRN
Start: 2016-11-01 — End: 2016-11-02

## 2016-11-01 MED ORDER — ACETAMINOPHEN 325 MG PO TABS: 325.0000 mg | ORAL_TABLET | ORAL | Status: DC | PRN

## 2016-11-01 MED ORDER — OXYCODONE-ACETAMINOPHEN 5-325 MG PO TABS: 1.0000 | ORAL_TABLET | Freq: Four times a day (QID) | ORAL | 0 refills | Status: DC | PRN

## 2016-11-01 MED ORDER — LACTATED RINGERS IV SOLN
INTRAVENOUS | Status: DC
  Administered 2016-11-01: 09:00:00 via INTRAVENOUS

## 2016-11-01 MED ORDER — 0.9 % SODIUM CHLORIDE (POUR BTL) OPTIME
TOPICAL | Status: DC | PRN
  Administered 2016-11-01: 1000 mL

## 2016-11-01 MED ORDER — FENTANYL CITRATE (PF) 100 MCG/2ML IJ SOLN: 25.0000 ug | INTRAMUSCULAR | Status: DC | PRN

## 2016-11-01 MED ORDER — POLYETHYLENE GLYCOL 3350 17 G PO PACK: 17.0000 g | PACK | Freq: Every day | ORAL | Status: DC | PRN

## 2016-11-01 MED ORDER — ONDANSETRON HCL 4 MG/2ML IJ SOLN
INTRAMUSCULAR | Status: DC | PRN
Start: 2016-11-01 — End: 2016-11-01
  Administered 2016-11-01: 4 mg via INTRAVENOUS

## 2016-11-01 MED ORDER — HYDRALAZINE HCL 20 MG/ML IJ SOLN: 5.0000 mg | INTRAMUSCULAR | Status: DC | PRN

## 2016-11-01 MED ORDER — SODIUM CHLORIDE 0.9 % IV SOLN
INTRAVENOUS | Status: DC
  Administered 2016-11-01: 18:00:00 via INTRAVENOUS

## 2016-11-01 MED ORDER — MIDAZOLAM HCL 2 MG/2ML IJ SOLN
INTRAMUSCULAR | Status: AC
  Filled 2016-11-01: qty 2

## 2016-11-01 MED ORDER — PROPOFOL 10 MG/ML IV BOLUS
INTRAVENOUS | Status: DC | PRN
  Administered 2016-11-01: 160 mg via INTRAVENOUS

## 2016-11-01 MED ORDER — IRBESARTAN 300 MG PO TABS
300.0000 mg | ORAL_TABLET | Freq: Every day | ORAL | Status: DC
  Administered 2016-11-01 – 2016-11-02 (×2): 300 mg via ORAL
  Filled 2016-11-01 (×2): qty 1

## 2016-11-01 MED ORDER — MAGNESIUM SULFATE 2 GM/50ML IV SOLN: 2.0000 g | Freq: Every day | INTRAVENOUS | Status: DC | PRN

## 2016-11-01 MED ORDER — DEXTROSE 5 % IV SOLN
1.5000 g | Freq: Two times a day (BID) | INTRAVENOUS | Status: AC
  Administered 2016-11-01 – 2016-11-02 (×2): 1.5 g via INTRAVENOUS
  Filled 2016-11-01 (×2): qty 1.5

## 2016-11-01 MED ORDER — PHENYLEPHRINE HCL 10 MG/ML IJ SOLN
INTRAMUSCULAR | Status: DC | PRN
  Administered 2016-11-01: 10 ug/min via INTRAVENOUS

## 2016-11-01 MED ORDER — LIDOCAINE HCL (CARDIAC) 20 MG/ML IV SOLN
INTRAVENOUS | Status: DC | PRN
  Administered 2016-11-01: 40 mg via INTRAVENOUS

## 2016-11-01 MED ORDER — HEMOSTATIC AGENTS (NO CHARGE) OPTIME
TOPICAL | Status: DC | PRN
  Administered 2016-11-01: 1 via TOPICAL

## 2016-11-01 MED ORDER — ALBUTEROL SULFATE (2.5 MG/3ML) 0.083% IN NEBU: 2.5000 mg | INHALATION_SOLUTION | Freq: Four times a day (QID) | RESPIRATORY_TRACT | Status: DC | PRN

## 2016-11-01 MED ORDER — ALIGN 4 MG PO CAPS: 4.0000 mg | ORAL_CAPSULE | Freq: Every day | ORAL | Status: DC

## 2016-11-01 MED ORDER — POTASSIUM CHLORIDE CRYS ER 20 MEQ PO TBCR: 20.0000 meq | EXTENDED_RELEASE_TABLET | Freq: Every day | ORAL | Status: DC | PRN

## 2016-11-01 MED ORDER — NAPHAZOLINE-GLYCERIN 0.012-0.2 % OP SOLN
1.0000 [drp] | Freq: Four times a day (QID) | OPHTHALMIC | Status: DC | PRN
  Administered 2016-11-01: 2 [drp] via OPHTHALMIC
  Filled 2016-11-01: qty 15

## 2016-11-01 MED ORDER — CARVEDILOL 3.125 MG PO TABS
3.1250 mg | ORAL_TABLET | Freq: Two times a day (BID) | ORAL | Status: DC
  Administered 2016-11-01 – 2016-11-02 (×2): 3.125 mg via ORAL
  Filled 2016-11-01 (×2): qty 1

## 2016-11-01 MED ORDER — PROTAMINE SULFATE 10 MG/ML IV SOLN
INTRAVENOUS | Status: DC | PRN
  Administered 2016-11-01: 50 mg via INTRAVENOUS

## 2016-11-01 MED ORDER — RISAQUAD PO CAPS
1.0000 | ORAL_CAPSULE | Freq: Every day | ORAL | Status: DC
  Administered 2016-11-02: 1 via ORAL
  Filled 2016-11-01: qty 1

## 2016-11-01 SURGICAL SUPPLY — 48 items
CANISTER SUCT 3000ML PPV (MISCELLANEOUS) ×3 IMPLANT
CATH EMB 5FR 80CM (CATHETERS) ×3 IMPLANT
CLIP TI MEDIUM 24 (CLIP) ×3 IMPLANT
CLIP TI WIDE RED SMALL 24 (CLIP) ×3 IMPLANT
DERMABOND ADVANCED (GAUZE/BANDAGES/DRESSINGS) ×2
DERMABOND ADVANCED .7 DNX12 (GAUZE/BANDAGES/DRESSINGS) ×1 IMPLANT
DRAIN CHANNEL 15F RND FF W/TCR (WOUND CARE) IMPLANT
DRAPE X-RAY CASS 24X20 (DRAPES) IMPLANT
ELECT REM PT RETURN 9FT ADLT (ELECTROSURGICAL) ×3
ELECTRODE REM PT RTRN 9FT ADLT (ELECTROSURGICAL) ×1 IMPLANT
EVACUATOR SILICONE 100CC (DRAIN) IMPLANT
GLOVE BIO SURGEON STRL SZ 6.5 (GLOVE) ×2 IMPLANT
GLOVE BIO SURGEONS STRL SZ 6.5 (GLOVE) ×1
GLOVE BIOGEL PI IND STRL 6 (GLOVE) ×1 IMPLANT
GLOVE BIOGEL PI IND STRL 6.5 (GLOVE) ×2 IMPLANT
GLOVE BIOGEL PI IND STRL 7.5 (GLOVE) ×1 IMPLANT
GLOVE BIOGEL PI INDICATOR 6 (GLOVE) ×2
GLOVE BIOGEL PI INDICATOR 6.5 (GLOVE) ×4
GLOVE BIOGEL PI INDICATOR 7.5 (GLOVE) ×2
GLOVE SURG SS PI 6.5 STRL IVOR (GLOVE) ×3 IMPLANT
GLOVE SURG SS PI 7.5 STRL IVOR (GLOVE) ×3 IMPLANT
GOWN STRL REUS W/ TWL LRG LVL3 (GOWN DISPOSABLE) ×3 IMPLANT
GOWN STRL REUS W/ TWL XL LVL3 (GOWN DISPOSABLE) ×1 IMPLANT
GOWN STRL REUS W/TWL LRG LVL3 (GOWN DISPOSABLE) ×6
GOWN STRL REUS W/TWL XL LVL3 (GOWN DISPOSABLE) ×2
HEMOSTAT SNOW SURGICEL 2X4 (HEMOSTASIS) IMPLANT
KIT BASIN OR (CUSTOM PROCEDURE TRAY) ×3 IMPLANT
KIT ROOM TURNOVER OR (KITS) ×3 IMPLANT
NS IRRIG 1000ML POUR BTL (IV SOLUTION) ×6 IMPLANT
PACK PERIPHERAL VASCULAR (CUSTOM PROCEDURE TRAY) ×3 IMPLANT
PAD ARMBOARD 7.5X6 YLW CONV (MISCELLANEOUS) ×6 IMPLANT
PATCH VASC XENOSURE 1CMX6CM (Vascular Products) ×2 IMPLANT
PATCH VASC XENOSURE 1X6 (Vascular Products) ×1 IMPLANT
POWDER SURGICEL 3.0 GRAM (HEMOSTASIS) ×3 IMPLANT
SET COLLECT BLD 21X3/4 12 (NEEDLE) IMPLANT
STOPCOCK 4 WAY LG BORE MALE ST (IV SETS) ×3 IMPLANT
SUT ETHILON 3 0 PS 1 (SUTURE) IMPLANT
SUT PROLENE 5 0 C 1 24 (SUTURE) ×3 IMPLANT
SUT PROLENE 6 0 BV (SUTURE) ×6 IMPLANT
SUT VIC AB 2-0 CT1 27 (SUTURE) ×4
SUT VIC AB 2-0 CT1 TAPERPNT 27 (SUTURE) ×2 IMPLANT
SUT VIC AB 3-0 SH 27 (SUTURE) ×2
SUT VIC AB 3-0 SH 27X BRD (SUTURE) ×1 IMPLANT
SUT VICRYL 4-0 PS2 18IN ABS (SUTURE) ×6 IMPLANT
SYR 3ML LL SCALE MARK (SYRINGE) ×3 IMPLANT
TUBING EXTENTION W/L.L. (IV SETS) IMPLANT
UNDERPAD 30X30 (UNDERPADS AND DIAPERS) ×3 IMPLANT
WATER STERILE IRR 1000ML POUR (IV SOLUTION) ×3 IMPLANT

## 2016-11-01 NOTE — Interval H&P Note (Signed)
History and Physical Interval Note:  11/01/2016 10:22 AM  [name]  has presented today for surgery, with the diagnosis of Left superficial femoral artery stenosis I77.1; Left calf pain M79.605  The various methods of treatment have been discussed with the patient and family. After consideration of risks, benefits and other options for treatment, the patient has consented to  Procedure(s): ENDARTERECTOMY FEMORAL (Left) PATCH ANGIOPLASTY (Left) as a surgical intervention .  The patient's history has been reviewed, patient examined, no change in status, stable for surgery.  I have reviewed the patient's chart and labs.  Questions were answered to the patient's satisfaction.     [name], [place]  No interval changes.  Plan for left femoral endarterectomy

## 2016-11-01 NOTE — Transfer of Care (Signed)
Immediate Anesthesia Transfer of Care Note  Patient: STRIKER SAHR  Procedure(s) Performed: Procedure(s): ENDARTERECTOMY FEMORAL (Left) PATCH ANGIOPLASTY (Left)  Patient Location: PACU  Anesthesia Type:General  Level of Consciousness: awake, alert , oriented and patient cooperative  Airway & Oxygen Therapy: Patient Spontanous Breathing and Patient connected to face mask oxygen  Post-op Assessment: Report given to RN and Post -op Vital signs reviewed and stable  Post vital signs: Reviewed and stable  Last Vitals:  Vitals:   11/01/16 1335 11/01/16 1345  BP: (!) 153/79   Pulse: 72 66  Resp: 11 (!) 8  Temp: 36.7 C     Last Pain:  Vitals:   11/01/16 1335  TempSrc:   PainSc: 0-No pain         Complications: No apparent anesthesia complications

## 2016-11-01 NOTE — Progress Notes (Signed)
  [facility] Day of Surgery Note  Subjective:  Complaining of something in right eye. Minimal soreness left groin.   Vitals:   11/01/16 1430 11/01/16 1500  BP: (!) 145/79 (!) 145/78  Pulse: 72 72  Resp: 11 10  Temp: 98.1 F (36.7 C) 98.3 F (36.8 C)   Right eye without erythema or swelling. No visible foreign objects seen.  Left groin incision intact. No hematoma. Minimal ecchymosis around incision. 1+ left DP pulse. 2+ left PT pulse.  Assessment/Plan:  This is a 61 y.o. male who is s/p left common femoral endarterectomy  Stable post-op. Eye drops ordered for right eye discomfort. Anticipate d/c tomorrow.   [name], [place] Pager: [phone] 11/01/2016 4:25 PM

## 2016-11-01 NOTE — Anesthesia Procedure Notes (Signed)
Procedure Name: Intubation Date/Time: 11/01/2016 11:09 AM Performed by: Shirlyn Goltz Pre-anesthesia Checklist: Patient identified, Emergency Drugs available, Suction available and Patient being monitored Patient Re-evaluated:Patient Re-evaluated prior to inductionOxygen Delivery Method: Circle system utilized Preoxygenation: Pre-oxygenation with 100% oxygen Intubation Type: IV induction Ventilation: Oral airway inserted - appropriate to patient size Laryngoscope Size: Mac and 4 Grade View: Grade II Tube type: Oral Tube size: 7.5 mm Number of attempts: 1 Airway Equipment and Method: Stylet and Oral airway Secured at: 23 cm Tube secured with: Tape Dental Injury: Teeth and Oropharynx as per pre-operative assessment

## 2016-11-01 NOTE — Anesthesia Postprocedure Evaluation (Addendum)
Anesthesia Post Note  Patient: [name]  Procedure(s) Performed: Procedure(s) (LRB): ENDARTERECTOMY FEMORAL (Left) PATCH ANGIOPLASTY (Left)  Patient location during evaluation: PACU Anesthesia Type: General Level of consciousness: awake, awake and alert and oriented Pain management: pain level controlled Vital Signs Assessment: post-procedure vital signs reviewed and stable Respiratory status: spontaneous breathing, nonlabored ventilation and respiratory function stable Cardiovascular status: blood pressure returned to baseline Anesthetic complications: no       Last Vitals:  Vitals:   11/01/16 1430 11/01/16 1500  BP: (!) 145/79 (!) 145/78  Pulse: 72 72  Resp: 11 10  Temp: 36.7 C 36.8 C    Last Pain:  Vitals:   11/01/16 1500  TempSrc: Oral  PainSc:                  Jacqueline Spofford COKER

## 2016-11-01 NOTE — Op Note (Signed)
    Patient name: [name] MRN: [id number] DOB: June 30, 1956 Sex: male  11/01/2016 Pre-operative Diagnosis: left leg claudication Post-operative diagnosis:  Same Surgeon:  Durene Cal Assistants:  Doreatha Massed Procedure:   Left common femoral, superficial femoral, profunda femoral and external iliac endarterectomy with patch angioplasty Anesthesia:  general Blood Loss:  See anesthesia record Specimens:  none  Findings:  Near occlusive plaque in the CFA and PFA origin.  Endarterectomy was 2 cm in the CFA and 3 cm in the SFA.  Bovine patch was used  Indications:  61 yo with left leg claudication and near occlusion in the CFA and PFA,  HE has distal SFA disease which is amenable to percutanenous intervention if his symptoms are not resolved after endarterectomy  Procedure:  The patient was identified in the holding area and taken to [facility] OR ROOM 16  The patient was then placed supine on the table. general anesthesia was administered.  The patient was prepped and draped in the usual sterile fashion.  A time out was called and antibiotics were administered.  A longitudinal incision was made in the left groin.  The CFA, PFA, and SFA were individually isolated.  He was fully heparanized.  Once this had circulated, the vessels were occluded.  A #11 blade was used to make a arteriotomy in the CFA which was extended with potts scissors longitudinally.  He had near occlusive plaque in the CFA and origin of the PFA.  The endarterectomy extended 2 cm n the CFA and 3 cm in the SFA.  A elevator was used to perform endarterectomy.  I place a #5 Fogarty catheter into the EIA for proximal control so that I could reach up and perform endarterectomy of the distal EIA.  Excellent inflow was established.  All potential embolic debris was removed.  Patch angioplasty was performed with a bovine pericardial patch and 5-0 prolene.  Appropriate flushing maneuvers were done prior to closing the patch.  He ahd excellent  doppler signals in the SFA,CFA, and PFA.  50 mg of protamine was given.  Once hemostasis was good, the incision was closed with multiple layers of vicryl and dermabond   Disposition:  To PACU stable   V. Durene Cal, M.D. [facility] [place] Office: [phone] Pager:  [phone]

## 2016-11-01 NOTE — H&P (View-Only) (Signed)
[facility] [place]  Patient name: [name] MRN: [id number] DOB: August 18, 1955 Sex: male   REFERRING PROVIDER:    Dr. Kirke Corin   REASON FOR CONSULT:    Left leg claudication  HISTORY OF PRESENT ILLNESS:   [name] is a 61 y.o. male who was referred By Dr. Rennis Golden for evaluation and management of peripheral arterial disease. The patient has known history of chronic systolic heart failure due to nonischemic cardiomyopathy in the setting of severe aortic stenosis due to bicuspid aortic valve. He underwent successful aortic valve replacement in July 2017 with subsequent improvement in LV systolic function. He has other chronic medical conditions that include previous tobacco use. He quit smoking in June 2018. He is not diabetic. Family history is negative for coronary artery disease.  The patient is on anticoagulation with Xarelto for postoperative atrial flutter. The patient noted severe left calf pain with walking which started last year and was mostly noticeable when he tried to start cardiac rehabilitation after his valve surgery. He could not do treadmill at all. His symptoms gradually progressed and currently he is able to walk only about 50 yards before having to stop and rest. He usually has to rest for about 5 minutes before he can resume. This has significantly affected his ability to perform activities of daily living and also has affected his functioning at work. He works in [employer].   He underwent angiography by Dr. Kirke Corin today and has distal CFA anf proximal SFA/PFA stenosis.  Femoral endarterectomy has been recommended.  He does not have any non-healing wounds.  He is a former smoker.  He is not on a statin.   PAST MEDICAL HISTORY    Past Medical History:  Diagnosis Date  . CAD (coronary artery disease) 01/23/2016   minor CAD at cath-40% RCA  . COPD (chronic obstructive pulmonary disease) (HCC)   .  Non-ischemic cardiomyopathy (HCC) 01/23/2016   EF 30-35%  . Severe aortic stenosis   . SOBOE (shortness of breath on exertion)      FAMILY HISTORY   Family History  Problem Relation Age of Onset  . Breast cancer Mother   . Breast cancer Sister     SOCIAL HISTORY:   Social History   Social History  . Marital status: Married    Spouse name: N/A  . Number of children: 4  . Years of education: 10   Occupational History  . Chartered certified accountant   Social History Main Topics  . Smoking status: Former Smoker    Packs/day: 1.00    Years: 45.00    Types: Cigarettes  . Smokeless tobacco: Current User  . Alcohol use 7.2 oz/week    12 Standard drinks or equivalent per week     Comment: beer  . Drug use: No  . Sexual activity: Not on file   Other Topics Concern  . Not on file   Social History Narrative  . No narrative on file    ALLERGIES:    No Known Allergies  CURRENT MEDICATIONS:    Current Facility-Administered Medications  Medication Dose Route Frequency Provider Last Rate Last Dose  . 0.9 %  sodium chloride infusion  250 mL Intravenous PRN [name], MD      . 0.9 %  sodium chloride infusion   Intravenous Continuous [name], MD 75 mL/hr at 10/16/16 [phone]    . 0.9 %  sodium chloride infusion   Intravenous Continuous Jerolyn Center  Argentina Donovan, MD 75 mL/hr at 10/16/16 0933 75 mL/hr at 10/16/16 0933  . 0.9 %  sodium chloride infusion  250 mL Intravenous PRN [name], MD      . aspirin chewable tablet 81 mg  81 mg Oral Pre-Cath [name], MD   Stopped at 10/16/16 [phone]  . sodium chloride flush (NS) 0.9 % injection 3 mL  3 mL Intravenous Q12H [name], MD      . sodium chloride flush (NS) 0.9 % injection 3 mL  3 mL Intravenous PRN [name], MD      . sodium chloride flush (NS) 0.9 % injection 3 mL  3 mL Intravenous Q12H [name], MD      . sodium chloride flush (NS) 0.9 % injection 3 mL  3 mL Intravenous  PRN [name], MD        REVIEW OF SYSTEMS:   [X]  denotes positive finding, [ ]  denotes negative finding Cardiac  Comments:  Chest pain or chest pressure:    Shortness of breath upon exertion:    Short of breath when lying flat:    Irregular heart rhythm:        Vascular    Pain in calf, thigh, or hip brought on by ambulation: x   Pain in feet at night that wakes you up from your sleep:     Blood clot in your veins:    Leg swelling:         Pulmonary    Oxygen at home:    Productive cough:     Wheezing:         Neurologic    Sudden weakness in arms or legs:     Sudden numbness in arms or legs:     Sudden onset of difficulty speaking or slurred speech:    Temporary loss of vision in one eye:     Problems with dizziness:         Gastrointestinal    Blood in stool:      Vomited blood:         Genitourinary    Burning when urinating:     Blood in urine:        Psychiatric    Major depression:         Hematologic    Bleeding problems:    Problems with blood clotting too easily:        Skin    Rashes or ulcers:        Constitutional    Fever or chills:     PHYSICAL EXAM:   Vitals:   10/16/16 1045 10/16/16 1049 10/16/16 1100 10/16/16 1130  BP: (!) 164/93 (!) 146/78 (!) 159/92 (!) 161/88  Pulse: (!) 58 (!) 58 65 61  Resp:      Temp:      TempSrc:      SpO2: 97% 94% 90% 91%  Weight:      Height:        GENERAL: The patient is a well-nourished male, in no acute distress. The vital signs are documented above. CARDIAC: There is a regular rate and rhythm.  VASCULAR: non-palpable left pedal pulses PULMONARY: Nonlabored respirations ABDOMEN: Soft and non-tender with normal pitched bowel sounds.  MUSCULOSKELETAL: There are no major deformities or cyanosis. NEUROLOGIC: No focal weakness or paresthesias are detected. SKIN: There are no ulcers or rashes noted. PSYCHIATRIC: The patient has a normal affect.  STUDIES:   I have reviewed  his angiogram  which shows distal CFA stenosis and proximal SFA/PFa stenosis with moderate distal SFA disease  ASSESSMENT and PLAN   We discussed proceeding with left femoral endarterectomy with patch angioplasty.  He will need to be off anticoagulation.  He does not need a lovenox bridge (d/w Dr Kirke Corin)  We discussed the risks and benefits of the operation including but not limited to infection, wound healing, long term patency and potentially additional procedures to his distal SFA.  He wants  To get this done as soon as possible.  Statin therapy should be initiated   Durene Cal, MD [facility] [phone] Pager [phone]

## 2016-11-01 NOTE — Anesthesia Preprocedure Evaluation (Addendum)
Anesthesia Evaluation  Patient identified by MRN, date of birth, ID band Patient awake    Reviewed: Allergy & Precautions, NPO status , Patient's Chart, lab work & pertinent test results  Airway Mallampati: II  TM Distance: >3 FB Neck ROM: Full    Dental  (+) Teeth Intact, Dental Advisory Given   Pulmonary former smoker,    breath sounds clear to auscultation       Cardiovascular hypertension,  Rhythm:Regular     Neuro/Psych    GI/Hepatic   Endo/Other    Renal/GU      Musculoskeletal   Abdominal   Peds  Hematology   Anesthesia Other Findings   Reproductive/Obstetrics                            Anesthesia Physical Anesthesia Plan  ASA: III  Anesthesia Plan: General   Post-op Pain Management:    Induction: Intravenous  Airway Management Planned: Oral ETT  Additional Equipment:   Intra-op Plan:   Post-operative Plan: Extubation in OR  Informed Consent: I have reviewed the patients History and Physical, chart, labs and discussed the procedure including the risks, benefits and alternatives for the proposed anesthesia with the patient or authorized representative who has indicated his/her understanding and acceptance.     Plan Discussed with: CRNA and Anesthesiologist  Anesthesia Plan Comments:         Anesthesia Quick Evaluation

## 2016-11-02 LAB — CBC
HCT: 43.3 % (ref 39.0–52.0)
HEMOGLOBIN: 14.6 g/dL (ref 13.0–17.0)
MCH: 33.2 pg (ref 26.0–34.0)
MCHC: 33.7 g/dL (ref 30.0–36.0)
MCV: 98.4 fL (ref 78.0–100.0)
Platelets: 131 10*3/uL — ABNORMAL LOW (ref 150–400)
RBC: 4.4 MIL/uL (ref 4.22–5.81)
RDW: 13.9 % (ref 11.5–15.5)
WBC: 6.1 10*3/uL (ref 4.0–10.5)

## 2016-11-02 LAB — BASIC METABOLIC PANEL
Anion gap: 8 (ref 5–15)
BUN: 8 mg/dL (ref 6–20)
CALCIUM: 8.2 mg/dL — AB (ref 8.9–10.3)
CHLORIDE: 103 mmol/L (ref 101–111)
CO2: 26 mmol/L (ref 22–32)
CREATININE: 0.75 mg/dL (ref 0.61–1.24)
GFR calc Af Amer: >60 mL/min (ref >60)
GFR calc non Af Amer: >60 mL/min (ref >60)
Glucose, Bld: 120 mg/dL — ABNORMAL HIGH (ref 65–99)
Potassium: 3.7 mmol/L (ref 3.5–5.1)
SODIUM: 137 mmol/L (ref 135–145)

## 2016-11-02 NOTE — Evaluation (Signed)
Occupational Therapy Evaluation Patient Details Name: [name] MRN: [id number] DOB: 1955-07-16 Today's Date: 11/02/2016    History of Present Illness Pt is a 61 y/o male s/p L common femoral, superficial femoral, profunda femoral and external iliac endarterectomy. PMH including but not limited to CAD, COPD, severe aortic stenosis and AVR in 2017.   Clinical Impression   Patient evaluated by Occupational Therapy with no further acute OT needs identified. All education has been completed and the patient has no further questions. Pt requires supervision- min A for ADLs. See below for any follow-up Occupational Therapy or equipment needs. OT is signing off. Thank you for this referral.      Follow Up Recommendations  No OT follow up;Supervision - Intermittent    Equipment Recommendations  None recommended by OT    Recommendations for Other Services       Precautions / Restrictions Precautions Precautions: Fall Restrictions Weight Bearing Restrictions: No      Mobility Bed Mobility               General bed mobility comments: Pt up in recliner   Transfers Overall transfer level: Modified independent Equipment used: None                  Balance Overall balance assessment: Needs assistance Sitting-balance support: Feet supported Sitting balance-Leahy Scale: Normal     Standing balance support: During functional activity;No upper extremity supported Standing balance-Leahy Scale: Good                             ADL either performed or assessed with clinical judgement   ADL Overall ADL's : Needs assistance/impaired Eating/Feeding: Independent Eating/Feeding Details (indicate cue type and reason): ` Grooming: Wash/dry hands;Wash/dry face;Oral care;Brushing hair;Supervision/safety;Standing   Upper Body Bathing: Set up;Sitting   Lower Body Bathing: Sit to/from stand;Minimal assistance   Upper Body Dressing : Set  up;Supervision/safety;Sitting   Lower Body Dressing: Minimal assistance;Sit to/from stand Lower Body Dressing Details (indicate cue type and reason): Pt unable to don Lt sock.  He was instructed to don Lt leg into pantleg first  Toilet Transfer: Supervision/safety;Ambulation;Comfort height toilet   Toileting- Clothing Manipulation and Hygiene: Supervision/safety;Sit to/from stand   Tub/ Shower Transfer: Tub transfer;Supervision/safety;Ambulation   Functional mobility during ADLs: Supervision/safety General ADL Comments: wife very supportive.  Pt with difficulty accessing Lt foot for ADLs secondary to pain      Vision         Perception     Praxis      Pertinent Vitals/Pain Pain Assessment: Faces Pain Score: 1  Faces Pain Scale: Hurts even more Pain Location: incision site with attempts at LB ADLs  Pain Descriptors / Indicators: Grimacing;Guarding Pain Intervention(s): Monitored during session;Repositioned     Hand Dominance Right   Extremity/Trunk Assessment Upper Extremity Assessment Upper Extremity Assessment: Overall WFL for tasks assessed   Lower Extremity Assessment Lower Extremity Assessment: Defer to PT evaluation LLE Deficits / Details: pt with some tenderness at incision site but strength is [facility] and sensation is grossly intact.   Cervical / Trunk Assessment Cervical / Trunk Assessment: Normal   Communication Communication Communication: No difficulties   Cognition Arousal/Alertness: Awake/alert Behavior During Therapy: WFL for tasks assessed/performed Overall Cognitive Status: Within Functional Limits for tasks assessed  General Comments       Exercises     Shoulder Instructions      Home Living Family/patient expects to be discharged to:: Private residence Living Arrangements: Spouse/significant other Available Help at Discharge: Family;Available 24 hours/day Type of Home: House Home Access:  Stairs to enter [employer] of Steps: 4 Entrance Stairs-Rails: Right;Left;Can reach both Home Layout: One level     Bathroom Shower/Tub: Chief Strategy Officer: Handicapped height     Home Equipment: Crutches;Cane - quad          Prior Functioning/Environment Level of [place]: Independent                 OT Problem List: Pain      OT Treatment/Interventions:      OT Goals(Current goals can be found in the care plan section) Acute Rehab OT Goals Patient Stated Goal: return home today OT Goal Formulation: All assessment and education complete, DC therapy  OT Frequency:     Barriers to D/C:            Co-evaluation              AM-PAC PT "6 Clicks" Daily Activity     Outcome Measure Help from another person eating meals?: None Help from another person taking care of personal grooming?: A Little Help from another person toileting, which includes using toliet, bedpan, or urinal?: A Little Help from another person bathing (including washing, rinsing, drying)?: A Little Help from another person to put on and taking off regular upper body clothing?: A Little Help from another person to put on and taking off regular lower body clothing?: A Little 6 Click Score: 19   End of Session Nurse Communication: Mobility status  Activity Tolerance: Patient tolerated treatment well Patient left: in chair;with call bell/phone within reach;with nursing/sitter in room;with family/visitor present  OT Visit Diagnosis: Pain Pain - Right/Left: Left Pain - part of body: Leg                Time: 1026-1040 OT Time Calculation (min): 14 min Charges:  OT General Charges $OT Visit: 1 Procedure OT Evaluation $OT Eval Low Complexity: 1 Procedure G-Codes:     [name], OTR/L [phone]   [name] M 11/02/2016, 11:55 AM

## 2016-11-02 NOTE — Progress Notes (Addendum)
  [facility] Progress Note  Subjective  - POD #1  Minimal soreness left groin. Right eye feels better.   Objective Vitals:   11/02/16 0342 11/02/16 0400  BP: 106/69 96/62  Pulse: 62 64  Resp: 19 11  Temp: 98.2 F (36.8 C)     Intake/Output Summary (Last 24 hours) at 11/02/16 0748 Last data filed at 11/01/16 2331  Gross per 24 hour  Intake          1268.75 ml  Output             1100 ml  Net           168.75 ml   Left groin without hematoma Palpable left PT. Biphasic DP.   Assessment/Planning: 61 y.o. male is s/p: left femoral endarterectomy 1 Day Post-Op   Doing well post-op. Groin without hematoma.  Has ambulated in halls. Will start lipitor.  Plan d/c home today.   Raymond Gurney 11/02/2016 7:48 AM -- AGree with above.  Foot is pink and warm.  Groin incision clean D/c home  Fabienne Bruns, MD [facility] [place] Office: [phone] Pager: [phone]  Laboratory CBC    Component Value Date/Time   WBC 6.1 11/02/2016 0221   HGB 14.6 11/02/2016 0221   HCT 43.3 11/02/2016 0221   PLT 131 (L) 11/02/2016 0221    BMET    Component Value Date/Time   NA 137 11/02/2016 0221   K 3.7 11/02/2016 0221   CL 103 11/02/2016 0221   CO2 26 11/02/2016 0221   GLUCOSE 120 (H) 11/02/2016 0221   BUN 8 11/02/2016 0221   CREATININE 0.75 11/02/2016 0221   CREATININE 1.01 10/08/2016 1157   CALCIUM 8.2 (L) 11/02/2016 0221   GFRNONAA >60 11/02/2016 0221   GFRAA >60 11/02/2016 0221    COAG Lab Results  Component Value Date   INR 1.08 11/01/2016   INR 1.2 (H) 10/08/2016   INR 3.3 04/12/2016   No results found for: PTT  Antibiotics Anti-infectives    Start     Dose/Rate Route Frequency Ordered Stop   11/01/16 1915  cefUROXime (ZINACEF) 1.5 g in dextrose 5 % 50 mL IVPB     1.5 g 100 mL/hr over 30 Minutes Intravenous Every 12 hours 11/01/16 1654 11/02/16 0701   11/01/16 0800  cefUROXime (ZINACEF) 1.5 g in dextrose 5 %  50 mL IVPB     1.5 g 100 mL/hr over 30 Minutes Intravenous 30 min pre-op 11/01/16 0800 11/01/16 1118       Maris Berger, PA-C [facility] Office: [phone] Pager: [phone] 11/02/2016 7:48 AM

## 2016-11-02 NOTE — Progress Notes (Signed)
Pt D/C home with wife via wheelchair. Discharge orders/instructions given. Went over discharge wound care, New meds. Femoral site care handout given to pt. Pt questions answered.

## 2016-11-02 NOTE — Discharge Instructions (Signed)
Ok to restart Xarelto this evening 11/02/16.

## 2016-11-02 NOTE — Evaluation (Signed)
Physical Therapy Evaluation Patient Details Name: [name] MRN: [id number] DOB: 1955/08/10 Today's Date: 11/02/2016   History of Present Illness  Pt is a 61 y/o male s/p L common femoral, superficial femoral, profunda femoral and external iliac endarterectomy. PMH including but not limited to CAD, COPD, severe aortic stenosis and AVR in 2017.  Clinical Impression  Pt presented sitting OOB in recliner chair, awake and willing to participate in therapy session. Prior to admission, pt reported that he is independent with all functional mobility and ADLs. Pt lives with his wife and four small dogs. His wife will be available 24/7 to assist him upon d/c. Pt ambulated in hallway with supervision, no AD, on RA. Pt's SPO2 decreased slightly to high 80's during ambulation but quickly returning to >90%. No further acute PT needs identified at this time. PT signing off.     Follow Up Recommendations No PT follow up    Equipment Recommendations  None recommended by PT    Recommendations for Other Services       Precautions / Restrictions Precautions Precautions: Fall Restrictions Weight Bearing Restrictions: No      Mobility  Bed Mobility               General bed mobility comments: pt sitting OOB in recliner chair when therapist arrived  Transfers Overall transfer level: Modified independent Equipment used: None                Ambulation/Gait Ambulation/Gait assistance: Supervision Ambulation Distance (Feet): 400 Feet Assistive device: None Gait Pattern/deviations: Step-through pattern;Decreased stride length Gait velocity: decreased Gait velocity interpretation: Below normal speed for age/gender General Gait Details: mild instability but no LOB or need for physical assistance, supervision for safety. pt able to safely navigate around obstacles in hallway.  Stairs            Wheelchair Mobility    Modified Rankin (Stroke Patients Only)       Balance  Overall balance assessment: Needs assistance Sitting-balance support: Feet supported Sitting balance-Leahy Scale: Normal     Standing balance support: During functional activity;No upper extremity supported Standing balance-Leahy Scale: Good                               Pertinent Vitals/Pain Pain Assessment: 0-10 Pain Score: 1  Pain Location: incision site Pain Descriptors / Indicators: Sore Pain Intervention(s): Monitored during session;Repositioned    Home Living Family/patient expects to be discharged to:: Private residence Living Arrangements: Spouse/significant other Available Help at Discharge: Family;Available 24 hours/day Type of Home: House Home Access: Stairs to enter Entrance Stairs-Rails: Right;Left;Can reach both Entrance Stairs-Number of Steps: 4 Home Layout: One level Home Equipment: Crutches;Cane - quad      Prior Function Level of Independence: Independent               Hand Dominance   Dominant Hand: Right    Extremity/Trunk Assessment   Upper Extremity Assessment Upper Extremity Assessment: Overall WFL for tasks assessed    Lower Extremity Assessment Lower Extremity Assessment: Overall WFL for tasks assessed;LLE deficits/detail LLE Deficits / Details: pt with some tenderness at incision site but strength is [facility] and sensation is grossly intact.    Cervical / Trunk Assessment Cervical / Trunk Assessment: Normal  Communication   Communication: No difficulties  Cognition Arousal/Alertness: Awake/alert Behavior During Therapy: WFL for tasks assessed/performed Overall Cognitive Status: Within Functional Limits for tasks assessed  General Comments      Exercises     Assessment/Plan    PT Assessment Patent does not need any further PT services  PT Problem List         PT Treatment Interventions      PT Goals (Current goals can be found in the Care Plan section)   Acute Rehab PT Goals Patient Stated Goal: return home today    Frequency     Barriers to discharge        Co-evaluation               AM-PAC PT "6 Clicks" Daily Activity  Outcome Measure Difficulty turning over in bed (including adjusting bedclothes, sheets and blankets)?: None Difficulty moving from lying on back to sitting on the side of the bed? : None Difficulty sitting down on and standing up from a chair with arms (e.g., wheelchair, bedside commode, etc,.)?: None Help needed moving to and from a bed to chair (including a wheelchair)?: None Help needed walking in hospital room?: A Little Help needed climbing 3-5 steps with a railing? : A Little 6 Click Score: 22    End of Session Equipment Utilized During Treatment: Gait belt Activity Tolerance: Patient tolerated treatment well Patient left: in chair;with call bell/phone within reach Nurse Communication: Mobility status PT Visit Diagnosis: Other abnormalities of gait and mobility (R26.89)    Time: 4854-6270 PT Time Calculation (min) (ACUTE ONLY): 18 min   Charges:   PT Evaluation $PT Eval Moderate Complexity: 1 Procedure     PT G Codes:        Deborah Chalk, PT, DPT [phone]   Alessandra Bevels Alesha Jaffee 11/02/2016, 9:29 AM

## 2016-11-04 ENCOUNTER — Encounter (HOSPITAL_COMMUNITY): Payer: Self-pay | Admitting: Surgery

## 2016-11-04 ENCOUNTER — Telehealth: Payer: Self-pay | Admitting: Surgery

## 2016-11-04 NOTE — Telephone Encounter (Signed)
-----   Message from Sharee Pimple, RN sent at 11/01/2016  2:31 PM EDT ----- Regarding: 2-3 weeks   ----- Message ----- From: Dara Lords, PA-C Sent: 11/01/2016   1:32 PM To: Vvs Charge Pool  s/p left fem endarterectomy 11/01/16.  f/u with Dr. Myra Gianotti in 2-3 weeks.  Thanks

## 2016-11-04 NOTE — Telephone Encounter (Signed)
Spoke to pt on home # for appt 5/21 and 8:30 mailed lttr

## 2016-11-04 NOTE — Discharge Summary (Signed)
[facility] Discharge Summary  ROI [name] Apr 21, 1956 61 y.o. male  [id number]  Admission Date: 11/01/2016  Discharge Date: 11/02/2016  Physician: Durene Cal, MD  Admission Diagnosis: Left superficial femoral artery stenosis I77.1; Left calf pain M79.605  HPI:   This is a 61 y.o. male who was referred By Dr. Rennis Golden for evaluation and management of peripheral arterial disease. The patient has known history of chronic systolic heart failure due to nonischemic cardiomyopathy in the setting of severe aortic stenosis due to bicuspid aortic valve. He underwent successful aortic valve replacement in July 2017 with subsequent improvement in LV systolic function. He has other chronic medical conditions that include previous tobacco use. He quit smoking in June 2018. He is not diabetic. Family history is negative for coronary artery disease.  The patient is on anticoagulation with Xarelto for postoperative atrial flutter. The patient noted severe left calf pain with walking which started last year and was mostly noticeable when he tried to start cardiac rehabilitation after his valve surgery. He could not do treadmill at all. His symptoms gradually progressed and currently he is able to walk only about 50 yards before having to stop and rest. He usually has to rest for about 5 minutes before he can resume. This has significantly affected his ability to perform activities of daily living and also has affected his functioning at work. He works in [employer].   He underwent angiography by Dr. Kirke Corin today and has distal CFA anf proximal SFA/PFA stenosis.  Femoral endarterectomy has been recommended.  He does not have any non-healing wounds.  He is a former smoker.  He is not on a statin.  Hospital Course:  The patient was admitted to the hospital and taken to the operating room on 11/01/2016 and underwent: Left common femoral, superficial femoral, profunda femoral and external  iliac endarterectomy with patch angioplasty    The patient tolerated the procedure well and was transported to the PACU in stable condition.   The patient did well on POD 1. His incision was intact without hematoma. He had a palpable left PT pulse. He was ambulating without difficulty and pain well controlled. He was discharged home on POD 1 in good condition.     CBC    Component Value Date/Time   WBC 6.1 11/02/2016 0221   RBC 4.40 11/02/2016 0221   HGB 14.6 11/02/2016 0221   HCT 43.3 11/02/2016 0221   PLT 131 (L) 11/02/2016 0221   MCV 98.4 11/02/2016 0221   MCH 33.2 11/02/2016 0221   MCHC 33.7 11/02/2016 0221   RDW 13.9 11/02/2016 0221    BMET    Component Value Date/Time   NA 137 11/02/2016 0221   K 3.7 11/02/2016 0221   CL 103 11/02/2016 0221   CO2 26 11/02/2016 0221   GLUCOSE 120 (H) 11/02/2016 0221   BUN 8 11/02/2016 0221   CREATININE 0.75 11/02/2016 0221   CREATININE 1.01 10/08/2016 1157   CALCIUM 8.2 (L) 11/02/2016 0221   GFRNONAA >60 11/02/2016 0221   GFRAA >60 11/02/2016 0221     Discharge Instructions:   The patient is discharged to home with extensive instructions on wound care and progressive ambulation.  They are instructed not to drive or perform any heavy lifting until returning to see the physician in his office.  Discharge Instructions    Call MD for:  redness, tenderness, or signs of infection (pain, swelling, bleeding, redness, odor or green/yellow discharge around incision site)    Complete  by:  As directed    Call MD for:  severe or increased pain, loss or decreased feeling  in affected limb(s)    Complete by:  As directed    Call MD for:  temperature >100.5    Complete by:  As directed    Discharge wound care:    Complete by:  As directed    Wash the groin wound with soap and water daily and pat dry. (No tub bath-only shower)  Then put a dry gauze or washcloth there to keep this area dry daily and as needed.  Do not use Vaseline or neosporin  on your incisions.  Only use soap and water on your incisions and then protect and keep dry.   Driving Restrictions    Complete by:  As directed    No driving for 1 week and while on pain medication   Increase activity slowly    Complete by:  As directed    Walk with assistance use walker or cane as needed   Lifting restrictions    Complete by:  As directed    No lifting for 1 week   Resume previous diet    Complete by:  As directed       Discharge Diagnosis:  Left superficial femoral artery stenosis I77.1; Left calf pain M79.605  Secondary Diagnosis: Patient Active Problem List   Diagnosis Date Noted  . PAD (peripheral artery disease) (HCC) 11/01/2016  . Gas bloat syndrome 10/03/2016  . Essential hypertension 10/03/2016  . Claudication in peripheral vascular disease (HCC) 08/28/2016  . Typical atrial flutter (HCC) 02/20/2016  . S/P AVR (aortic valve replacement) 02/20/2016  . Chronic systolic (congestive) heart failure (HCC) 02/20/2016  . Long term current use of anticoagulant therapy 02/07/2016  . Atrial fibrillation (HCC) 02/02/2016  . CAD- minor CAD at cath  01/24/2016  . BPH (benign prostatic hyperplasia) 01/24/2016  . COPD (chronic obstructive pulmonary disease) (HCC)   . Non-ischemic cardiomyopathy (HCC) 01/19/2016  . Shortness of breath 01/19/2016  . Other fatigue 01/19/2016  . Screening for lipid disorders 01/19/2016  . MR (mitral regurgitation) 01/19/2016   Past Medical History:  Diagnosis Date  . Asthma   . CAD (coronary artery disease) 01/23/2016   minor CAD at cath-40% RCA  . CHF (congestive heart failure) (HCC)    aortic valve replacement  . COPD (chronic obstructive pulmonary disease) (HCC)    denies patient said that a pulmonologist said he said that he didnt have COPD  . Dyspnea   . Dysrhythmia    hx afib  . Heart murmur    had a murmur before the aortic valve replacement, no murmur according to dr. Rennis Golden per patient  . Hypertension   .  Non-ischemic cardiomyopathy (HCC) 01/23/2016   EF 30-35%  . Pneumonia   . Severe aortic stenosis   . SOBOE (shortness of breath on exertion)      Allergies as of 11/02/2016      Reactions   No Known Allergies       Medication List    TAKE these medications   ALIGN 4 MG Caps Take 4 mg by mouth daily.   aspirin 81 MG tablet Take 1 tablet (81 mg total) by mouth daily.   atorvastatin 10 MG tablet Commonly known as:  LIPITOR Take 1 tablet (10 mg total) by mouth daily. Notes to patient:  Medication to start today   BREO ELLIPTA 100-25 MCG/INH Aepb Generic drug:  fluticasone furoate-vilanterol USE 1 INHALATION DAILY What  changed:  how much to take  how to take this  when to take this  additional instructions   carvedilol 3.125 MG tablet Commonly known as:  COREG Take 1 tablet (3.125 mg total) by mouth 2 (two) times daily with a meal.   finasteride 5 MG tablet Commonly known as:  PROSCAR Take 1 tablet (5 mg total) by mouth daily.   furosemide 40 MG tablet Commonly known as:  LASIX Take 1 tablet (40 mg total) by mouth daily.   oxyCODONE-acetaminophen 5-325 MG tablet Commonly known as:  PERCOCET/ROXICET Take 1 tablet by mouth every 6 (six) hours as needed.   potassium chloride SA 20 MEQ tablet Commonly known as:  K-DUR,KLOR-CON Take 1 tablet (20 mEq total) by mouth daily.   PROAIR RESPICLICK 108 (90 Base) MCG/ACT Aepb Generic drug:  Albuterol Sulfate Inhale 2 puffs into the lungs every 6 (six) hours as needed (shortness of breath).   rivaroxaban 20 MG Tabs tablet Commonly known as:  XARELTO Take 1 tablet (20 mg total) by mouth daily with supper.   tamsulosin 0.4 MG Caps capsule Commonly known as:  FLOMAX Take 0.4 mg by mouth daily.   valsartan 320 MG tablet Commonly known as:  DIOVAN Take 1 tablet (320 mg total) by mouth daily.       Percocet #30 No Refill  Disposition: Home  Patient's condition: is Good  Follow up: 1. Dr. Myra Gianotti in 2  weeks   Maris Berger, PA-C [facility] [phone] 11/04/2016  1:53 PM  - For VQI Registry use --- Instructions: Press F2 to tab through selections.  Delete question if not applicable.   Discharge medications: Statin use:  yes ASA use:  yes Plavix use:  no Beta blocker use: yes Coumadin use: no, Xarelto

## 2016-11-05 ENCOUNTER — Ambulatory Visit: Payer: BLUE CROSS/BLUE SHIELD | Admitting: Cardiovascular Disease

## 2016-11-12 ENCOUNTER — Ambulatory Visit (INDEPENDENT_AMBULATORY_CARE_PROVIDER_SITE_OTHER): Payer: BLUE CROSS/BLUE SHIELD | Admitting: Cardiovascular Disease

## 2016-11-12 ENCOUNTER — Encounter: Payer: Self-pay | Admitting: Surgery

## 2016-11-12 VITALS — BP 139/84 | HR 69 | Ht 69.5 in | Wt 208.2 lb

## 2016-11-12 DIAGNOSIS — I739 Peripheral vascular disease, unspecified: Secondary | ICD-10-CM

## 2016-11-12 DIAGNOSIS — I5022 Chronic systolic (congestive) heart failure: Secondary | ICD-10-CM

## 2016-11-12 NOTE — Progress Notes (Signed)
Cardiology Office Note   Date:  11/12/2016   ID:  [name], DOB 15-Nov-1955, MRN [id number]  PCP:  Nonnie Done., MD  Cardiologist:  Dr. Rennis Golden  Chief Complaint  Patient presents with  . Follow-up    Angio, pt denied chest, pt c/o swelling in soreness in legs      History of Present Illness: [name] is a 61 y.o. male who is here today for a follow-up visit regarding peripheral arterial disease.  The patient has known history of chronic systolic heart failure due to nonischemic cardiomyopathy in the setting of severe aortic stenosis due to bicuspid aortic valve. He underwent successful aortic valve replacement in July 2017 with subsequent improvement in LV systolic function. He has other chronic medical conditions that include previous tobacco use. He quit smoking in June 2018. He is not diabetic. Family history is negative for coronary artery disease. The patient is on anticoagulation with Xarelto for postoperative atrial flutter. He was seen last month for severe left calf  claudication.   Angiography showed no significant aortoiliac disease, no significant obstructive disease affecting the right lower extremity. On the left side, there was heavily calcified stenosis affecting the left common femoral artery extending into the ostium of both the profunda and SFA. He underwent left common femoral artery endarterectomy extending into the ostium of the SFA and profunda by Dr. Myra Gianotti about 10 days ago without complications. He had significant swelling in that area but that has been gradually improving. He reports significant improvement in left calf claudication.  Past Medical History:  Diagnosis Date  . Asthma   . CAD (coronary artery disease) 01/23/2016   minor CAD at cath-40% RCA  . CHF (congestive heart failure) (HCC)    aortic valve replacement  . COPD (chronic obstructive pulmonary disease) (HCC)    denies patient said that a pulmonologist said he said that he  didnt have COPD  . Dyspnea   . Dysrhythmia    hx afib  . Heart murmur    had a murmur before the aortic valve replacement, no murmur according to dr. Rennis Golden per patient  . Hypertension   . Non-ischemic cardiomyopathy (HCC) 01/23/2016   EF 30-35%  . Pneumonia   . Severe aortic stenosis   . SOBOE (shortness of breath on exertion)     Past Surgical History:  Procedure Laterality Date  . ABDOMINAL AORTOGRAM W/LOWER EXTREMITY N/A 10/16/2016   Procedure: Abdominal Aortogram w/Lower Extremity;  Surgeon: [name], MD;  Location: [facility] INVASIVE CV LAB;  Service: Cardiovascular;  Laterality: N/A;  . AORTIC VALVE REPLACEMENT N/A 01/29/2016   Procedure: AORTIC VALVE REPLACEMENT (AVR) with Magna Ease size 25mm;  Surgeon: [name], MD;  Location: [facility] OR;  Service: Open Heart Surgery;  Laterality: N/A;  . CARDIAC CATHETERIZATION N/A 01/23/2016   Procedure: Right/Left Heart Cath and Coronary Angiography;  Surgeon: Chrystie Nose, MD;  Location: [facility] INVASIVE CV LAB;  Service: Cardiovascular;  Laterality: N/A;  . CARDIOVERSION N/A 03/26/2016   Procedure: CARDIOVERSION;  Surgeon: Chrystie Nose, MD;  Location: [facility] ENDOSCOPY;  Service: Cardiovascular;  Laterality: N/A;  . CATARACT EXTRACTION  12/2002 & 09/2007  . ENDARTERECTOMY FEMORAL Left 11/01/2016   Procedure: ENDARTERECTOMY FEMORAL;  Surgeon: [name], MD;  Location: [facility] OR;  Service: Vascular;  Laterality: Left;  . PATCH ANGIOPLASTY Left 11/01/2016   Procedure: PATCH ANGIOPLASTY;  Surgeon: [name], MD;  Location: [facility] OR;  Service: Vascular;  Laterality: Left;  .  SHOULDER SURGERY Right 1971  . TEE WITHOUT CARDIOVERSION N/A 01/29/2016   Procedure: TRANSESOPHAGEAL ECHOCARDIOGRAM (TEE);  Surgeon: [name], MD;  Location: [facility] OR;  Service: Open Heart Surgery;  Laterality: N/A;     Current Outpatient Prescriptions  Medication Sig Dispense Refill  . aspirin 81 MG tablet Take 1 tablet (81 mg total) by mouth daily.    Marland Kitchen  atorvastatin (LIPITOR) 10 MG tablet Take 1 tablet (10 mg total) by mouth daily. 30 tablet 11  . BREO ELLIPTA 100-25 MCG/INH AEPB USE 1 INHALATION DAILY (Patient taking differently: Inhale 1 puff into the lungs daily. )  0  . carvedilol (COREG) 3.125 MG tablet Take 1 tablet (3.125 mg total) by mouth 2 (two) times daily with a meal. 180 tablet 3  . finasteride (PROSCAR) 5 MG tablet Take 1 tablet (5 mg total) by mouth daily. 30 tablet 1  . furosemide (LASIX) 40 MG tablet Take 1 tablet (40 mg total) by mouth daily. 90 tablet 3  . oxyCODONE-acetaminophen (PERCOCET/ROXICET) 5-325 MG tablet Take 1 tablet by mouth every 6 (six) hours as needed. 30 tablet 0  . potassium chloride SA (K-DUR,KLOR-CON) 20 MEQ tablet Take 1 tablet (20 mEq total) by mouth daily. 90 tablet 3  . PROAIR RESPICLICK 108 (90 Base) MCG/ACT AEPB Inhale 2 puffs into the lungs every 6 (six) hours as needed (shortness of breath).   3  . Probiotic Product (ALIGN) 4 MG CAPS Take 4 mg by mouth daily.    . rivaroxaban (XARELTO) 20 MG TABS tablet Take 1 tablet (20 mg total) by mouth daily with supper. 30 tablet 6  . tamsulosin (FLOMAX) 0.4 MG CAPS capsule Take 0.4 mg by mouth daily.  1  . valsartan (DIOVAN) 320 MG tablet Take 1 tablet (320 mg total) by mouth daily. 90 tablet 3   No current facility-administered medications for this visit.     Allergies:   No known allergies    Social History:  The patient  reports that he quit smoking about 9 months ago. His smoking use included Cigarettes. He has a 45.00 pack-year smoking history. He has quit using smokeless tobacco. He reports that he drinks about 7.2 oz of alcohol per week . He reports that he does not use drugs.   Family History:  The patient's family history includes Breast cancer in his mother and sister.    ROS:  Please see the history of present illness.   Otherwise, review of systems are positive for none.   All other systems are reviewed and negative.    PHYSICAL EXAM: VS:   BP 139/84   Pulse 69   Ht 5' 9.5" (1.765 m)   Wt 208 lb 3.2 oz (94.4 kg)   BMI 30.30 kg/m  , BMI Body mass index is 30.3 kg/m. GEN: Well nourished, well developed, in no acute distress  HEENT: normal  Neck: no JVD, carotid bruits, or masses Cardiac: RRR; no murmurs, rubs, or gallops,no edema  Respiratory:  clear to auscultation bilaterally, normal work of breathing GI: soft, nontender, nondistended, + BS MS: no deformity or atrophy  Skin: warm and dry, no rash Neuro:  Strength and sensation are intact Psych: euthymic mood, full affect  vascular: Radial pulses +2 bilaterally.  Surgical scar in the left groin is healing with no significant discharge.  EKG:  EKG is not ordered today.    Recent Labs: 01/19/2016: Brain Natriuretic Peptide 1,100.6 01/30/2016: Magnesium 1.9 03/22/2016: TSH 7.64 10/25/2016: ALT 36 11/02/2016: BUN 8; Creatinine,  Ser 0.75; Hemoglobin 14.6; Platelets 131; Potassium 3.7; Sodium 137    Lipid Panel    Component Value Date/Time   CHOL 158 01/19/2016 1029   TRIG 55 01/19/2016 1029   HDL 67 01/19/2016 1029   CHOLHDL 2.4 01/19/2016 1029   VLDL 11 01/19/2016 1029   LDLCALC 80 01/19/2016 1029      Wt Readings from Last 3 Encounters:  11/12/16 208 lb 3.2 oz (94.4 kg)  11/01/16 211 lb 10.3 oz (96 kg)  10/25/16 212 lb (96.2 kg)       PAD Screen 10/08/2016  Previous PAD dx? No  Previous surgical procedure? No  Pain with walking? Yes  Subsides with rest? No  Feet/toe relief with dangling? No  Painful, non-healing ulcers? No  Extremities discolored? No      ASSESSMENT AND PLAN:  1.  Peripheral arterial disease: Status post left common femoral artery endarterectomy for severe lifestyle limiting left calf claudication.   No significant obstructive disease affecting the right lower extremity. He has a follow-up appointment with Dr. Myra Gianotti on Monday. The patient does have residual left SFA disease but I suspect that most of his claudication will improve  to the point of not requiring further intervention. I will have him follow-up with me in 4 months  2. Chronic systolic heart failure: He appears to be euvolemic on current dose of furosemide.  3. Status post aortic valve replacement with bioprosthetic valve. He seems stable.   Disposition:   FU with me in 4 months  Signed,  Lorine Bears, MD  11/12/2016 11:44 AM    [facility]

## 2016-11-12 NOTE — Patient Instructions (Signed)
Medication Instructions: Continue same medications.   Labwork: None.   Procedures/Testing: None.   Follow-Up: 4 months with Dr. Arida.   Any Additional Special Instructions Will Be Listed Below (If Applicable).     If you need a refill on your cardiac medications before your next appointment, please call your pharmacy.   

## 2016-11-15 ENCOUNTER — Other Ambulatory Visit: Payer: Self-pay | Admitting: Internal Medicine

## 2016-11-18 ENCOUNTER — Encounter: Payer: Self-pay | Admitting: Surgery

## 2016-11-18 ENCOUNTER — Ambulatory Visit (INDEPENDENT_AMBULATORY_CARE_PROVIDER_SITE_OTHER): Payer: Self-pay | Admitting: Surgery

## 2016-11-18 VITALS — BP 143/86 | HR 59 | Temp 97.2°F | Resp 20 | Ht 69.5 in | Wt 210.0 lb

## 2016-11-18 DIAGNOSIS — I70212 Atherosclerosis of native arteries of extremities with intermittent claudication, left leg: Secondary | ICD-10-CM

## 2016-11-18 NOTE — Progress Notes (Signed)
Patient name: [name] MRN: [id number] DOB: 1956-05-31 Sex: male  REASON FOR VISIT:     post op  HISTORY OF PRESENT ILLNESS:   [name] is a 61 y.o. male who is status post left common femoral, superficial femoral, profundofemoral, and external iliac endarterectomy with bovine pericardial patch angioplasty.  This was done for left leg claudication and near occlusion of the common femoral profunda femoral artery.  He says that his claudication symptoms have completely resolved.  He does have some swelling in the left leg.  He is eager to return to work  CURRENT MEDICATIONS:    Current Outpatient Prescriptions  Medication Sig Dispense Refill  . aspirin 81 MG tablet Take 1 tablet (81 mg total) by mouth daily.    Marland Kitchen atorvastatin (LIPITOR) 10 MG tablet Take 1 tablet (10 mg total) by mouth daily. 30 tablet 11  . BREO ELLIPTA 100-25 MCG/INH AEPB USE 1 INHALATION DAILY (Patient taking differently: Inhale 1 puff into the lungs daily. )  0  . carvedilol (COREG) 3.125 MG tablet Take 1 tablet (3.125 mg total) by mouth 2 (two) times daily with a meal. 180 tablet 3  . finasteride (PROSCAR) 5 MG tablet Take 1 tablet (5 mg total) by mouth daily. 30 tablet 1  . furosemide (LASIX) 40 MG tablet Take 1 tablet (40 mg total) by mouth daily. 90 tablet 3  . potassium chloride SA (K-DUR,KLOR-CON) 20 MEQ tablet Take 1 tablet (20 mEq total) by mouth daily. 90 tablet 3  . PROAIR RESPICLICK 108 (90 Base) MCG/ACT AEPB Inhale 2 puffs into the lungs every 6 (six) hours as needed (shortness of breath).   3  . Probiotic Product (ALIGN) 4 MG CAPS Take 4 mg by mouth daily.    . tamsulosin (FLOMAX) 0.4 MG CAPS capsule Take 0.4 mg by mouth daily.  1  . valsartan (DIOVAN) 320 MG tablet Take 1 tablet (320 mg total) by mouth daily. 90 tablet 3  . XARELTO 20 MG TABS tablet TAKE 1 TABLET (20 MG TOTAL) BY MOUTH DAILY WITH SUPPER. 30 tablet 6  . oxyCODONE-acetaminophen  (PERCOCET/ROXICET) 5-325 MG tablet Take 1 tablet by mouth every 6 (six) hours as needed. (Patient not taking: Reported on 11/18/2016) 30 tablet 0   No current facility-administered medications for this visit.     REVIEW OF SYSTEMS:   [X]  denotes positive finding, [ ]  denotes negative finding Cardiac  Comments:  Chest pain or chest pressure:    Shortness of breath upon exertion:    Short of breath when lying flat:    Irregular heart rhythm:    Constitutional    Fever or chills:      PHYSICAL EXAM:   Vitals:   11/18/16 0841 11/18/16 0843  BP: (!) 143/86 (!) 143/86  Pulse: (!) 59   Resp: 20   Temp: 97.2 F (36.2 C)   TempSrc: Oral   SpO2: 95%   Weight: 210 lb (95.3 kg)   Height: 5' 9.5" (1.765 m)     GENERAL: The patient is a well-nourished male, in no acute distress. The vital signs are documented above. CARDIOVASCULAR: There is a regular rate and rhythm. PULMONARY: Non-labored respirations Left leg edema. The incision is healing nicely  STUDIES:   None   MEDICAL ISSUES:   The patient is recovering as expected from his operation.  His claudication symptoms have resolved.  --I have encouraged him to wear a compression stocking to help with the leg swelling.  I have  also encouraged him to keep his leg elevated when possible  --I have cleared him to return to work as long as he is able to keep the incision dry and avoid heavy lifting.  --He will follow-up with me in 3 months to make sure that his swelling is resolving and to make sure that he has recovered appropriately.  He will continue to get surveillance imaging with Dr. Kirke Corin.  Durene Cal, MD [facility] [phone] Pager [phone]

## 2016-12-17 ENCOUNTER — Other Ambulatory Visit: Payer: Self-pay

## 2016-12-17 DIAGNOSIS — I739 Peripheral vascular disease, unspecified: Secondary | ICD-10-CM

## 2017-01-10 ENCOUNTER — Encounter: Payer: Self-pay | Admitting: Internal Medicine

## 2017-01-10 ENCOUNTER — Ambulatory Visit (INDEPENDENT_AMBULATORY_CARE_PROVIDER_SITE_OTHER): Payer: BLUE CROSS/BLUE SHIELD | Admitting: Internal Medicine

## 2017-01-10 VITALS — BP 138/80 | HR 63 | Ht 69.5 in | Wt 213.8 lb

## 2017-01-10 DIAGNOSIS — E785 Hyperlipidemia, unspecified: Secondary | ICD-10-CM | POA: Diagnosis not present

## 2017-01-10 DIAGNOSIS — Z79899 Other long term (current) drug therapy: Secondary | ICD-10-CM

## 2017-01-10 DIAGNOSIS — Z7901 Long term (current) use of anticoagulants: Secondary | ICD-10-CM

## 2017-01-10 DIAGNOSIS — I739 Peripheral vascular disease, unspecified: Secondary | ICD-10-CM | POA: Diagnosis not present

## 2017-01-10 DIAGNOSIS — I480 Paroxysmal atrial fibrillation: Secondary | ICD-10-CM

## 2017-01-10 DIAGNOSIS — I428 Other cardiomyopathies: Secondary | ICD-10-CM

## 2017-01-10 DIAGNOSIS — Z952 Presence of prosthetic heart valve: Secondary | ICD-10-CM | POA: Diagnosis not present

## 2017-01-10 LAB — COMPREHENSIVE METABOLIC PANEL
ALK PHOS: 67 IU/L (ref 39–117)
ALT: 38 IU/L (ref 0–44)
AST: 29 IU/L (ref 0–40)
Albumin/Globulin Ratio: 1.6 (ref 1.2–2.2)
Albumin: 4.4 g/dL (ref 3.6–4.8)
BILIRUBIN TOTAL: 0.5 mg/dL (ref 0.0–1.2)
BUN/Creatinine Ratio: 16 (ref 10–24)
BUN: 14 mg/dL (ref 8–27)
CHLORIDE: 100 mmol/L (ref 96–106)
CO2: 24 mmol/L (ref 20–29)
Calcium: 9.3 mg/dL (ref 8.6–10.2)
Creatinine, Ser: 0.85 mg/dL (ref 0.76–1.27)
GFR calc non Af Amer: 93 mL/min/{1.73_m2} (ref >59)
GFR, EST AFRICAN AMERICAN: 107 mL/min/{1.73_m2} (ref >59)
GLUCOSE: 93 mg/dL (ref 65–99)
Globulin, Total: 2.7 g/dL (ref 1.5–4.5)
POTASSIUM: 5.3 mmol/L — AB (ref 3.5–5.2)
Sodium: 140 mmol/L (ref 134–144)
Total Protein: 7.1 g/dL (ref 6.0–8.5)

## 2017-01-10 LAB — PRO B NATRIURETIC PEPTIDE: NT-PRO BNP: 195 pg/mL (ref 0–210)

## 2017-01-10 LAB — LIPID PANEL
CHOL/HDL RATIO: 2 ratio (ref 0.0–5.0)
Cholesterol, Total: 138 mg/dL (ref 100–199)
HDL: 68 mg/dL (ref >39)
LDL Calculated: 57 mg/dL (ref 0–99)
TRIGLYCERIDES: 63 mg/dL (ref 0–149)
VLDL Cholesterol Cal: 13 mg/dL (ref 5–40)

## 2017-01-10 NOTE — Progress Notes (Signed)
OFFICE NOTE  Chief Complaint:  No complaints, feels well  Primary Care Physician: Nonnie Done., MD  HPI:  [name] is a 61 y.o. male he was kindly referred to me for evaluation of new onset shortness of breath. He saw his primary care provider who had been following him for an abnormal cardiac murmur in the past. In fact [name] had previously seen a cardiologist in [place], probably more than 5 years ago who did an echocardiogram and then she had valvular heart disease which could get worse over time. He had not followed up with his cardiologist. On presentation last month to his primary care provider he was notably short of breath and underwent a chest x-ray which showed some mild pulmonary edema as well as COPD changes. He underwent office spirometry which indicated obstruction and possible restriction with some bronchodilator reversibility and an FEV1 less than 80% predicted. Subsequently he was referred to an echocardiogram which was performed at [facility]. I reviewed the interpretation which indicates mild LV dilatation and mild global hypokinesis with an EF of 45-50%. There was marketed dilatation of the left atrium, mild aortic insufficiency and severe aortic stenosis. The peak and mean gradients across the valve or 71 mmHg and 50 mmHg, respectively with a calculated aortic valve area of 0.62 cm. There was also mild to moderate mitral regurgitation. The study was performed on 12/01/2015. [name], but is still able to do physical activities. He denies any chest pain. Family history indicates death by natural causes of family members but no known coronary disease.  02/20/2016  [name] today returns today for hospital follow-up. He underwent fairly urgent cardiac catheterization by myself, the results of which were as follows:   Mid RCA lesion, 40 %stenosed.  Prox RCA lesion, 10  %stenosed.  Hemodynamic findings consistent with moderate pulmonary hypertension, mitral valve regurgitation and aortic valve stenosis.   Non-obstructive CAD. Mildly reduced cardiac index. PA 58%, PCWP 28,  Mean PA 45. BNP was 1100 and CXR shows pulmonary edema. A repeat echocardiogram showed that EF has decreased even lower to 20-25%. He was admitted for diuresis and CT surgery was consulted for aortic valve replacement. After diuresis he underwent aortic valve replacement with a 25 mm pericardial Edwards magna ease valve on 01/29/2016, at which time he was found to have severe bicuspid aortic valve stenosis and insufficiency. He also had significant bilateral pleural effusions which were drained with chest tubes. Recovery was uneventful except that he developed atrial fibrillation on or about 02/01/2016. He was started on oral amiodarone and warfarin. After discharge he is INR remains subtherapeutic, but recently had increased to 1.9. A repeat INR checked today was 2.5. An EKG in the office today demonstrates atrial flutter with variable A-V response at 82. There is LVH by voltage. [name] reports reports feeling much better. He is not symptomatic with his atrial flutter. He is about to start cardiac rehabilitation at [facility].  03/22/2016  [name] returns today for follow-up. He remains in atrial flutter which is rate controlled. Overall he seems to be recovering from surgery. He's now had at least one month of consistent therapeutic INRs between 2 and 3. He recently saw Dr. Donata Clay in follow-up who felt that he was doing well from a cardiac standpoint. We had discussed cardioversion as an option if he remained in atrial flutter which is persistent. I did discuss risks  and benefits of electrical cardioversion with him today and he is agreeable to proceed.  05/21/2016  [name] returns for follow-up today. He is maintaining sinus rhythm on low dose amiodarone after cardioversion. He  reports some improvement in his symptoms. He still reports daytime fatigue and is noted to snore - I wonder if he has OSA. He says he is almost out of amiodarone. He denies worsening dyspnea or weight gain.  08/28/2016  [name] is seen today in follow-up. Overall he feels well although he recently has noted elevated blood pressure. He was seen at his pulmonologist office and blood pressure was over 200 systolic and greater than 100 diastolic. Today blood pressure was 182/94. Previously blood pressure had been well controlled. He has had an improvement in LV function after surgery - echo performed this month showed an improvement in LVEF up to 45-50% however it is not totally normalized. He denies any recurrent atrial fibrillation since he underwent recent cardioversion. He is on amiodarone but has weaned down to 200 mg daily. EKG today shows sinus rhythm with mild lateral ST and T-wave changes. He also reports today that he's been having some problems with left lower extremity pain. Particularly notes that after walking about 150 yards he gets some pain in his left calf. He noted this during cardiac rehabilitation and it does seem to persist. He's concerned about PAD.  10/03/2016  [name] is seen today follow-up. He reports that his blood pressure recently has been very elevated. Generally at home it runs between 1 6170 systolic. He says he doesn't feel well when it's at that level. He recently had peripheral lower extremity arterial Dopplers which indicated a reduced ABI 0.61 on the left and 1.1 on the right. He has been complaining of left lower extremity claudication. I've gone ahead and referred him to Dr. Kirke Corin for evaluation of this. He is also complaining of a lot of gas and bloating. He says he's tried simethicone without relief. His bowel movements are fairly normal. He says he thinks is related to medications however it is not clear which medication would cause this and I do not typically hear  complaints of this. He could've been related to surgery.  01/10/2017  [name] returns today for follow-up. He is now feeling very well. He denies any chest pain or worsening shortness of breath. He was having left lower extremity claudication and was seen by Dr. Benard Rink. He ultimately underwent atherectomy and patch angioplasty by Dr. Myra Gianotti and has had complete improvement in his left lower extremity claudication. He initially had some swelling but this is improved. He's had good wound healing in the left groin. He is scheduled to follow-up with Dr. Myra Gianotti in August. He would have repeat lower extremity arterial Dopplers in September and see Dr. Benard Rink. He has had some weight gain and is looking forward to starting to do more exercise. His last echo was in February 2018 which showed improvement in EF to 45-50%. He denies any bleeding problems on Xarelto. He was started on low-dose atorvastatin his LDL-C was above 70 (80, in 12/2015). He has not had a repeat lipid profile since then.  PMHx:  Past Medical History:  Diagnosis Date  . Asthma   . CAD (coronary artery disease) 01/23/2016   minor CAD at cath-40% RCA  . CHF (congestive heart failure) (HCC)    aortic valve replacement  . COPD (chronic obstructive pulmonary disease) (HCC)    denies patient said that a pulmonologist said  he said that he didnt have COPD  . Dyspnea   . Dysrhythmia    hx afib  . Heart murmur    had a murmur before the aortic valve replacement, no murmur according to dr. Rennis Golden per patient  . Hypertension   . Non-ischemic cardiomyopathy (HCC) 01/23/2016   EF 30-35%  . Pneumonia   . Severe aortic stenosis   . SOBOE (shortness of breath on exertion)     Past Surgical History:  Procedure Laterality Date  . ABDOMINAL AORTOGRAM W/LOWER EXTREMITY N/A 10/16/2016   Procedure: Abdominal Aortogram w/Lower Extremity;  Surgeon: [name], MD;  Location: [facility] INVASIVE CV LAB;  Service: Cardiovascular;  Laterality: N/A;  .  AORTIC VALVE REPLACEMENT N/A 01/29/2016   Procedure: AORTIC VALVE REPLACEMENT (AVR) with Magna Ease size 25mm;  Surgeon: [name], MD;  Location: [facility] OR;  Service: Open Heart Surgery;  Laterality: N/A;  . CARDIAC CATHETERIZATION N/A 01/23/2016   Procedure: Right/Left Heart Cath and Coronary Angiography;  Surgeon: Chrystie Nose, MD;  Location: [facility] INVASIVE CV LAB;  Service: Cardiovascular;  Laterality: N/A;  . CARDIOVERSION N/A 03/26/2016   Procedure: CARDIOVERSION;  Surgeon: Chrystie Nose, MD;  Location: [facility] ENDOSCOPY;  Service: Cardiovascular;  Laterality: N/A;  . CATARACT EXTRACTION  12/2002 & 09/2007  . ENDARTERECTOMY FEMORAL Left 11/01/2016   Procedure: ENDARTERECTOMY FEMORAL;  Surgeon: [name], MD;  Location: [facility] OR;  Service: Vascular;  Laterality: Left;  . PATCH ANGIOPLASTY Left 11/01/2016   Procedure: PATCH ANGIOPLASTY;  Surgeon: [name], MD;  Location: [facility] OR;  Service: Vascular;  Laterality: Left;  . SHOULDER SURGERY Right 1971  . TEE WITHOUT CARDIOVERSION N/A 01/29/2016   Procedure: TRANSESOPHAGEAL ECHOCARDIOGRAM (TEE);  Surgeon: [name], MD;  Location: [facility] OR;  Service: Open Heart Surgery;  Laterality: N/A;    FAMHx:  Family History  Problem Relation Age of Onset  . Breast cancer Mother   . Breast cancer Sister     SOCHx:   reports that he quit smoking about a year ago. His smoking use included Cigarettes. He has a 45.00 pack-year smoking history. He has quit using smokeless tobacco. He reports that he drinks about 7.2 oz of alcohol per week . He reports that he does not use drugs.  ALLERGIES:  Allergies  Allergen Reactions  . No Known Allergies     ROS: Pertinent items noted in HPI and remainder of comprehensive ROS otherwise negative.  HOME MEDS: Current Outpatient Prescriptions  Medication Sig Dispense Refill  . aspirin 81 MG tablet Take 1 tablet (81 mg total) by mouth daily.    Marland Kitchen atorvastatin (LIPITOR) 10 MG tablet Take 1 tablet (10 mg  total) by mouth daily. 30 tablet 11  . BREO ELLIPTA 100-25 MCG/INH AEPB USE 1 INHALATION DAILY (Patient taking differently: Inhale 1 puff into the lungs daily. )  0  . carvedilol (COREG) 3.125 MG tablet Take 1 tablet (3.125 mg total) by mouth 2 (two) times daily with a meal. 180 tablet 3  . finasteride (PROSCAR) 5 MG tablet Take 1 tablet (5 mg total) by mouth daily. 30 tablet 1  . furosemide (LASIX) 40 MG tablet Take 1 tablet (40 mg total) by mouth daily. 90 tablet 3  . potassium chloride SA (K-DUR,KLOR-CON) 20 MEQ tablet Take 1 tablet (20 mEq total) by mouth daily. 90 tablet 3  . PROAIR RESPICLICK 108 (90 Base) MCG/ACT AEPB Inhale 2 puffs into the lungs every 6 (six) hours as needed (shortness of  breath).   3  . tamsulosin (FLOMAX) 0.4 MG CAPS capsule Take 0.4 mg by mouth daily.  1  . valsartan (DIOVAN) 320 MG tablet Take 1 tablet (320 mg total) by mouth daily. 90 tablet 3  . XARELTO 20 MG TABS tablet TAKE 1 TABLET (20 MG TOTAL) BY MOUTH DAILY WITH SUPPER. 30 tablet 6   No current facility-administered medications for this visit.     LABS/IMAGING: No results found for this or any previous visit (from the past 48 hour(s)). No results found.  WEIGHTS: Wt Readings from Last 3 Encounters:  01/10/17 213 lb 12.8 oz (97 kg)  11/18/16 210 lb (95.3 kg)  11/12/16 208 lb 3.2 oz (94.4 kg)    VITALS: BP 138/80   Pulse 63   Ht 5' 9.5" (1.765 m)   Wt 213 lb 12.8 oz (97 kg)   BMI 31.12 kg/m   EXAM: General appearance: alert, no distress and mildly obese Neck: no carotid bruit, no JVD and thyroid not enlarged, symmetric, no tenderness/mass/nodules Lungs: clear to auscultation bilaterally Heart: regular rate and rhythm Abdomen: soft, non-tender; bowel sounds normal; no masses,  no organomegaly Extremities: edema Trace left lower extremity edema Pulses: 2+ and symmetric Skin: Skin color, texture, turgor normal. No rashes or lesions Neurologic: Grossly normal Psych: Pleasant  EKG: Normal  sinus rhythm at 63  ASSESSMENT: 1. Severe symptomatic bicuspid calcific aortic stenosis - s/p 25 mm Edwards MagnaEase tissue valve (12/2015) 2. Mild to moderate mitral regurgitation with "marked" left atrial enlargement 3. COPD - quit smoking (2017) 4. Dilated cardiomyopathy with EF 45-50% -> reduced to 20-25% perioperatively, now improved to 45-50% (08/2016) 5. Persistent postoperative atrial flutter-CHADSVASC score of 3 (on warfarin), s/p cardioversion and maintaining sinus 6. Claudication- status post left common femoral, superficial femoral, profunda femoral and external iliac endarterectomy and bovine pericardial patch angioplasty (10/2016) 7. Hypertension 8. Gas/bloating  PLAN: 1.   [name] is significantly improved with recent surgery to the left femoral artery system. He denies any more claudication. His bicuspid aortic valve is been replaced and echo in February showed improvement in LV function with normal valve architecture. He is quit smoking and should benefit his COPD. He did have postoperative atrial flutter and is maintaining sinus rhythm. Blood pressure is at goal. He's due for repeat lipid profile was recently started on low-dose Lipitor. We'll go ahead and check that today and plan follow-up in 6 months or sooner as necessary.  Chrystie Nose, MD, [facility] Attending Cardiologist [facility]  Chrystie Nose 01/10/2017, 10:19 AM

## 2017-01-10 NOTE — Patient Instructions (Signed)
Your physician recommends that you return for lab work FASTING  Your physician wants you to follow-up in: 6 months with Dr. Hilty. You will receive a reminder letter in the mail two months in advance. If you don't receive a letter, please call our office to schedule the follow-up appointment.   

## 2017-01-14 ENCOUNTER — Other Ambulatory Visit: Payer: Self-pay | Admitting: Internal Medicine

## 2017-02-12 ENCOUNTER — Encounter: Payer: Self-pay | Admitting: Surgery

## 2017-02-17 ENCOUNTER — Telehealth: Payer: Self-pay | Admitting: Internal Medicine

## 2017-02-17 ENCOUNTER — Other Ambulatory Visit: Payer: Self-pay | Admitting: Internal Medicine

## 2017-02-17 MED ORDER — LOSARTAN POTASSIUM 100 MG PO TABS: 100.0000 mg | ORAL_TABLET | Freq: Every day | ORAL | 3 refills | Status: DC

## 2017-02-17 NOTE — Telephone Encounter (Signed)
New Message     [facility]  Pt c/o medication issue:  1. Name of Medication: Valsartan  2. How are you currently taking this medication (dosage and times per day)? 1x time a day   3. Are you having a reaction (difficulty breathing--STAT)?  no  4. What is your medication issue?  Recall - needs different prescription called in to pharmacy

## 2017-02-17 NOTE — Telephone Encounter (Signed)
Patient is currently on valsartan 320mg  once daily >> losartan 100mg  once daily (HTN, non-ischemic CM, grade 1DD).   Patient called w/instructions regarding med change. Advised to monitor home BP every 2-3 days for a few weeks to ensure no significant change in home BP readings.   Rx(s) sent to pharmacy electronically.

## 2017-02-20 NOTE — Addendum Note (Signed)
Addendum  created 02/20/17 1404 by Loyola Santino, MD   Sign clinical note    

## 2017-02-20 NOTE — Addendum Note (Signed)
Addendum  created 02/20/17 1402 by Kipp Brood, MD   Sign clinical note

## 2017-02-24 ENCOUNTER — Ambulatory Visit (INDEPENDENT_AMBULATORY_CARE_PROVIDER_SITE_OTHER): Payer: BLUE CROSS/BLUE SHIELD | Admitting: Surgery

## 2017-02-24 VITALS — BP 142/85 | HR 57 | Ht 69.5 in | Wt 214.6 lb

## 2017-02-24 DIAGNOSIS — I70212 Atherosclerosis of native arteries of extremities with intermittent claudication, left leg: Secondary | ICD-10-CM | POA: Diagnosis not present

## 2017-02-24 NOTE — Progress Notes (Signed)
[facility] [place]  Patient name: [name] MRN: [id number] DOB: July 07, 1955 Sex: male   REASON FOR VISIT:    Follow up claudication  HISOTRY OF PRESENT ILLNESS:    [name] is a 61 y.o. male who is status post left common femoral, superficial femoral, profundofemoral, and external iliac endarterectomy with bovine pericardial patch angioplasty on 11-01-2016.  This was done for left leg claudication and near occlusion of the common femoral profunda femoral artery.  Swelling in the postoperative period.  This has nearly resolved.  He did wear compression stockings and does wear those occasionally.  His claudication symptoms have resolved.  PAST MEDICAL HISTORY:   Past Medical History:  Diagnosis Date  . Asthma   . CAD (coronary artery disease) 01/23/2016   minor CAD at cath-40% RCA  . CHF (congestive heart failure) (HCC)    aortic valve replacement  . COPD (chronic obstructive pulmonary disease) (HCC)    denies patient said that a pulmonologist said he said that he didnt have COPD  . Dyspnea   . Dysrhythmia    hx afib  . Heart murmur    had a murmur before the aortic valve replacement, no murmur according to dr. Rennis Golden per patient  . Hypertension   . Non-ischemic cardiomyopathy (HCC) 01/23/2016   EF 30-35%  . Pneumonia   . Severe aortic stenosis   . SOBOE (shortness of breath on exertion)      FAMILY HISTORY:   Family History  Problem Relation Age of Onset  . Breast cancer Mother   . Breast cancer Sister     SOCIAL HISTORY:   Social History  Substance Use Topics  . Smoking status: Former Smoker    Packs/day: 1.00    Years: 45.00    Types: Cigarettes    Quit date: 01/23/2016  . Smokeless tobacco: Former Neurosurgeon  . Alcohol use 7.2 oz/week    12 Standard drinks or equivalent per week     Comment: beer     ALLERGIES:   Allergies  Allergen Reactions  . No Known Allergies      CURRENT  MEDICATIONS:   Current Outpatient Prescriptions  Medication Sig Dispense Refill  . aspirin 81 MG tablet Take 1 tablet (81 mg total) by mouth daily.    Marland Kitchen atorvastatin (LIPITOR) 10 MG tablet Take 1 tablet (10 mg total) by mouth daily. 30 tablet 11  . BREO ELLIPTA 100-25 MCG/INH AEPB USE 1 INHALATION DAILY (Patient taking differently: Inhale 1 puff into the lungs daily. )  0  . carvedilol (COREG) 3.125 MG tablet Take 1 tablet (3.125 mg total) by mouth 2 (two) times daily with a meal. 180 tablet 3  . finasteride (PROSCAR) 5 MG tablet Take 1 tablet (5 mg total) by mouth daily. 30 tablet 1  . furosemide (LASIX) 40 MG tablet TAKE 1 TABLET (40 MG TOTAL) BY MOUTH DAILY. 90 tablet 1  . losartan (COZAAR) 100 MG tablet Take 1 tablet (100 mg total) by mouth daily. 90 tablet 3  . tamsulosin (FLOMAX) 0.4 MG CAPS capsule Take 0.4 mg by mouth daily.  1  . XARELTO 20 MG TABS tablet TAKE 1 TABLET (20 MG TOTAL) BY MOUTH DAILY WITH SUPPER. 30 tablet 6  . PROAIR RESPICLICK 108 (90 Base) MCG/ACT AEPB Inhale 2 puffs into the lungs every 6 (six) hours as needed (shortness of breath).   3   No current facility-administered medications for this visit.     REVIEW OF  SYSTEMS:   [X]  denotes positive finding, [ ]  denotes negative finding Cardiac  Comments:  Chest pain or chest pressure:    Shortness of breath upon exertion:    Short of breath when lying flat:    Irregular heart rhythm:        Vascular    Pain in calf, thigh, or hip brought on by ambulation:    Pain in feet at night that wakes you up from your sleep:     Blood clot in your veins:    Leg swelling:  x       Pulmonary    Oxygen at home:    Productive cough:     Wheezing:         Neurologic    Sudden weakness in arms or legs:     Sudden numbness in arms or legs:     Sudden onset of difficulty speaking or slurred speech:    Temporary loss of vision in one eye:     Problems with dizziness:         Gastrointestinal    Blood in stool:       Vomited blood:         Genitourinary    Burning when urinating:     Blood in urine:        Psychiatric    Major depression:         Hematologic    Bleeding problems:    Problems with blood clotting too easily:        Skin    Rashes or ulcers:        Constitutional    Fever or chills:      PHYSICAL EXAM:   Vitals:   02/24/17 0841  BP: (!) 142/85  Pulse: (!) 57  SpO2: 97%  Weight: 214 lb 9.6 oz (97.3 kg)  Height: 5' 9.5" (1.765 m)    GENERAL: The patient is a well-nourished male, in no acute distress. The vital signs are documented above. CARDIAC: There is a regular rate and rhythm.  VASCULAR: Left groin incision is completely healed.  Trace edema in the left leg. PULMONARY: Non-labored respirations MUSCULOSKELETAL: There are no major deformities or cyanosis. NEUROLOGIC: No focal weakness or paresthesias are detected. SKIN: There are no ulcers or rashes noted. PSYCHIATRIC: The patient has a normal affect.  STUDIES:   None  MEDICAL ISSUES:   Status post left femoral endarterectomy: The patient is doing very well from a surgical perspective.  He is scheduled to have ultrasound surveillance done by Dr.Arida.  He has known stenosis within his left adductor canal, which is not affecting him currently.  His follow-up will be done by Dr. Kirke Corin, and I will see him back on an as-needed basis.    Durene Cal, MD [facility] [phone] Pager [phone]

## 2017-03-07 ENCOUNTER — Other Ambulatory Visit: Payer: Self-pay | Admitting: Cardiovascular Disease

## 2017-03-07 DIAGNOSIS — I739 Peripheral vascular disease, unspecified: Secondary | ICD-10-CM

## 2017-03-18 ENCOUNTER — Ambulatory Visit (INDEPENDENT_AMBULATORY_CARE_PROVIDER_SITE_OTHER): Payer: BLUE CROSS/BLUE SHIELD | Admitting: Cardiovascular Disease

## 2017-03-18 ENCOUNTER — Ambulatory Visit: Payer: BLUE CROSS/BLUE SHIELD | Admitting: Cardiovascular Disease

## 2017-03-18 ENCOUNTER — Encounter: Payer: Self-pay | Admitting: Cardiovascular Disease

## 2017-03-18 ENCOUNTER — Ambulatory Visit (HOSPITAL_COMMUNITY)
Admission: RE | Admit: 2017-03-18 | Discharge: 2017-03-18 | Disposition: A | Discharge status: Home / Self Care | Payer: BLUE CROSS/BLUE SHIELD | Source: Ambulatory Visit | Attending: Cardiovascular Disease | Admitting: Cardiovascular Disease

## 2017-03-18 VITALS — BP 118/78 | HR 64 | Ht 69.5 in | Wt 214.0 lb

## 2017-03-18 DIAGNOSIS — I739 Peripheral vascular disease, unspecified: Secondary | ICD-10-CM | POA: Diagnosis not present

## 2017-03-18 DIAGNOSIS — R938 Abnormal findings on diagnostic imaging of other specified body structures: Secondary | ICD-10-CM | POA: Insufficient documentation

## 2017-03-18 DIAGNOSIS — Z87891 Personal history of nicotine dependence: Secondary | ICD-10-CM | POA: Diagnosis not present

## 2017-03-18 DIAGNOSIS — E785 Hyperlipidemia, unspecified: Secondary | ICD-10-CM

## 2017-03-18 DIAGNOSIS — J449 Chronic obstructive pulmonary disease, unspecified: Secondary | ICD-10-CM | POA: Insufficient documentation

## 2017-03-18 DIAGNOSIS — I2510 Atherosclerotic heart disease of native coronary artery without angina pectoris: Secondary | ICD-10-CM | POA: Insufficient documentation

## 2017-03-18 DIAGNOSIS — I10 Essential (primary) hypertension: Secondary | ICD-10-CM | POA: Insufficient documentation

## 2017-03-18 DIAGNOSIS — I5022 Chronic systolic (congestive) heart failure: Secondary | ICD-10-CM | POA: Diagnosis not present

## 2017-03-18 DIAGNOSIS — I708 Atherosclerosis of other arteries: Secondary | ICD-10-CM | POA: Diagnosis not present

## 2017-03-18 DIAGNOSIS — E789 Disorder of lipoprotein metabolism, unspecified: Secondary | ICD-10-CM | POA: Insufficient documentation

## 2017-03-18 NOTE — Patient Instructions (Signed)
Medication Instructions:   STOP ASPIRIN  Follow-Up:  Your physician wants you to follow-up in: 6 MONTHS WITH DR Katheren Puller will receive a reminder letter in the mail two months in advance. If you don't receive a letter, please call our office to schedule the follow-up appointment.   If you need a refill on your cardiac medications before your next appointment, please call your pharmacy.

## 2017-03-18 NOTE — Progress Notes (Signed)
Cardiology Office Note   Date:  03/18/2017   ID:  [name], DOB 07-30-1955, MRN [id number]  PCP:  Nonnie Done., MD  Cardiologist:  Dr. Rennis Golden  Chief Complaint  Patient presents with  . Follow-up    PAD      History of Present Illness: [name] is a 61 y.o. male who is here today for a follow-up visit regarding peripheral arterial disease.  The patient has known history of chronic systolic heart failure due to nonischemic cardiomyopathy in the setting of severe aortic stenosis due to bicuspid aortic valve. He underwent successful aortic valve replacement in July 2017 with subsequent improvement in LV systolic function. He has other chronic medical conditions that include previous tobacco use. He quit smoking in June 2018. He is not diabetic. The patient is on anticoagulation with Xarelto for postoperative atrial flutter. The patient is status post left common femoral artery endarterectomy this year for severe left leg claudication. Left leg claudication resolved. He quit smoking and has been doing very well. He denies any chest pain, shortness of breath or palpitations. He was diagnosed with sleep apnea and is having hard time adjusting to CPAP.  Past Medical History:  Diagnosis Date  . Asthma   . CAD (coronary artery disease) 01/23/2016   minor CAD at cath-40% RCA  . CHF (congestive heart failure) (HCC)    aortic valve replacement  . COPD (chronic obstructive pulmonary disease) (HCC)    denies patient said that a pulmonologist said he said that he didnt have COPD  . Dyspnea   . Dysrhythmia    hx afib  . Heart murmur    had a murmur before the aortic valve replacement, no murmur according to dr. Rennis Golden per patient  . Hypertension   . Non-ischemic cardiomyopathy (HCC) 01/23/2016   EF 30-35%  . Pneumonia   . Severe aortic stenosis   . SOBOE (shortness of breath on exertion)     Past Surgical History:  Procedure Laterality Date  . ABDOMINAL AORTOGRAM  W/LOWER EXTREMITY N/A 10/16/2016   Procedure: Abdominal Aortogram w/Lower Extremity;  Surgeon: [name], MD;  Location: [facility] INVASIVE CV LAB;  Service: Cardiovascular;  Laterality: N/A;  . AORTIC VALVE REPLACEMENT N/A 01/29/2016   Procedure: AORTIC VALVE REPLACEMENT (AVR) with Magna Ease size 9mm;  Surgeon: [name], MD;  Location: [facility] OR;  Service: Open Heart Surgery;  Laterality: N/A;  . CARDIAC CATHETERIZATION N/A 01/23/2016   Procedure: Right/Left Heart Cath and Coronary Angiography;  Surgeon: Chrystie Nose, MD;  Location: [facility] INVASIVE CV LAB;  Service: Cardiovascular;  Laterality: N/A;  . CARDIOVERSION N/A 03/26/2016   Procedure: CARDIOVERSION;  Surgeon: Chrystie Nose, MD;  Location: [facility] ENDOSCOPY;  Service: Cardiovascular;  Laterality: N/A;  . CATARACT EXTRACTION  12/2002 & 09/2007  . ENDARTERECTOMY FEMORAL Left 11/01/2016   Procedure: ENDARTERECTOMY FEMORAL;  Surgeon: [name], MD;  Location: [facility] OR;  Service: Vascular;  Laterality: Left;  . PATCH ANGIOPLASTY Left 11/01/2016   Procedure: PATCH ANGIOPLASTY;  Surgeon: [name], MD;  Location: [facility] OR;  Service: Vascular;  Laterality: Left;  . SHOULDER SURGERY Right 1971  . TEE WITHOUT CARDIOVERSION N/A 01/29/2016   Procedure: TRANSESOPHAGEAL ECHOCARDIOGRAM (TEE);  Surgeon: [name], MD;  Location: [facility] OR;  Service: Open Heart Surgery;  Laterality: N/A;     Current Outpatient Prescriptions  Medication Sig Dispense Refill  . aspirin 81 MG tablet Take 1 tablet (81 mg total) by mouth  daily.    . atorvastatin (LIPITOR) 10 MG tablet Take 1 tablet (10 mg total) by mouth daily. 30 tablet 11  . BREO ELLIPTA 100-25 MCG/INH AEPB USE 1 INHALATION DAILY (Patient taking differently: Inhale 1 puff into the lungs daily. )  0  . carvedilol (COREG) 3.125 MG tablet Take 1 tablet (3.125 mg total) by mouth 2 (two) times daily with a meal. 180 tablet 3  . finasteride (PROSCAR) 5 MG tablet Take 1 tablet (5 mg total) by mouth daily. 30  tablet 1  . furosemide (LASIX) 40 MG tablet TAKE 1 TABLET (40 MG TOTAL) BY MOUTH DAILY. 90 tablet 1  . losartan (COZAAR) 100 MG tablet Take 1 tablet (100 mg total) by mouth daily. 90 tablet 3  . PROAIR RESPICLICK 108 (90 Base) MCG/ACT AEPB Inhale 2 puffs into the lungs every 6 (six) hours as needed (shortness of breath).   3  . tamsulosin (FLOMAX) 0.4 MG CAPS capsule Take 0.4 mg by mouth daily.  1  . XARELTO 20 MG TABS tablet TAKE 1 TABLET (20 MG TOTAL) BY MOUTH DAILY WITH SUPPER. 30 tablet 6   No current facility-administered medications for this visit.     Allergies:   No known allergies    Social History:  The patient  reports that he quit smoking about 13 months ago. His smoking use included Cigarettes. He has a 45.00 pack-year smoking history. He has quit using smokeless tobacco. He reports that he drinks about 7.2 oz of alcohol per week . He reports that he does not use drugs.   Family History:  The patient's family history includes Breast cancer in his mother and sister.    ROS:  Please see the history of present illness.   Otherwise, review of systems are positive for none.   All other systems are reviewed and negative.    PHYSICAL EXAM: VS:  BP 118/78   Pulse 64   Ht 5' 9.5" (1.765 m)   Wt 214 lb (97.1 kg)   BMI 31.15 kg/m  , BMI Body mass index is 31.15 kg/m. GEN: Well nourished, well developed, in no acute distress  HEENT: normal  Neck: no JVD, carotid bruits, or masses Cardiac: RRR; no murmurs, rubs, or gallops,no edema  Respiratory:  clear to auscultation bilaterally, normal work of breathing GI: soft, nontender, nondistended, + BS MS: no deformity or atrophy  Skin: warm and dry, no rash Neuro:  Strength and sensation are intact Psych: euthymic mood, full affect  vascular: Femoral pulses are normal bilaterally. Posterior tibial is palpable on both sides.  EKG:  EKG is not ordered today.    Recent Labs: 03/22/2016: TSH 7.64 11/02/2016: Hemoglobin 14.6;  Platelets 131 01/10/2017: ALT 38; BUN 14; Creatinine, Ser 0.85; NT-Pro BNP 195; Potassium 5.3; Sodium 140    Lipid Panel    Component Value Date/Time   CHOL 138 01/10/2017 0941   TRIG 63 01/10/2017 0941   HDL 68 01/10/2017 0941   CHOLHDL 2.0 01/10/2017 0941   CHOLHDL 2.4 01/19/2016 1029   VLDL 11 01/19/2016 1029   LDLCALC 57 01/10/2017 0941      Wt Readings from Last 3 Encounters:  03/18/17 214 lb (97.1 kg)  02/24/17 214 lb 9.6 oz (97.3 kg)  01/10/17 213 lb 12.8 oz (97 kg)       PAD Screen 10/08/2016  Previous PAD dx? No  Previous surgical procedure? No  Pain with walking? Yes  Subsides with rest? No  Feet/toe relief with dangling? No  Painful, non-healing ulcers? No  Extremities discolored? No      ASSESSMENT AND PLAN:  1.  Peripheral arterial disease: Status post left common femoral artery endarterectomy for severe lifestyle limiting left calf claudication.   No recurrent claudication. Vascular studies today showed normal ABI bilaterally. Duplex showed borderline left external iliac artery stenosis. Repeat study in 6 months. I discontinued aspirin given that he is on Xarelto.  2. Chronic systolic heart failure: He appears to be euvolemic on current dose of furosemide.  3. Status post aortic valve replacement with bioprosthetic valve. He seems stable.  4. Paroxysmal atrial flutter: Currently in sinus rhythm and on anticoagulation with Xarelto.  5. Hyperlipidemia: Continue treatment with atorvastatin with a target LDL of less than 70.   Disposition:   FU with me in 6 months  Signed,  Lorine Bears, MD  03/18/2017 10:31 AM    [facility]

## 2017-04-04 ENCOUNTER — Other Ambulatory Visit: Payer: Self-pay | Admitting: Internal Medicine

## 2017-06-16 ENCOUNTER — Other Ambulatory Visit: Payer: Self-pay | Admitting: Internal Medicine

## 2017-07-11 ENCOUNTER — Ambulatory Visit: Payer: BLUE CROSS/BLUE SHIELD | Admitting: Internal Medicine

## 2017-07-11 VITALS — BP 121/74 | HR 58 | Resp 16 | Ht 70.0 in | Wt 219.0 lb

## 2017-07-11 DIAGNOSIS — I428 Other cardiomyopathies: Secondary | ICD-10-CM

## 2017-07-11 DIAGNOSIS — I739 Peripheral vascular disease, unspecified: Secondary | ICD-10-CM

## 2017-07-11 DIAGNOSIS — I480 Paroxysmal atrial fibrillation: Secondary | ICD-10-CM

## 2017-07-11 DIAGNOSIS — Z952 Presence of prosthetic heart valve: Secondary | ICD-10-CM | POA: Diagnosis not present

## 2017-07-11 NOTE — Patient Instructions (Signed)
Your physician wants you to follow-up in: 6 months with Dr. Hilty. You will receive a reminder letter in the mail two months in advance. If you don't receive a letter, please call our office to schedule the follow-up appointment.    

## 2017-07-12 ENCOUNTER — Encounter: Payer: Self-pay | Admitting: Internal Medicine

## 2017-07-12 NOTE — Progress Notes (Signed)
OFFICE NOTE  Chief Complaint:  No complaints  Primary Care Physician: Nonnie Done., MD  HPI:  [name] is a 62 y.o. male he was kindly referred to me for evaluation of new onset shortness of breath. He saw his primary care provider who had been following him for an abnormal cardiac murmur in the past. In fact [name] had previously seen a cardiologist in [place], probably more than 5 years ago who did an echocardiogram and then she had valvular heart disease which could get worse over time. He had not followed up with his cardiologist. On presentation last month to his primary care provider he was notably short of breath and underwent a chest x-ray which showed some mild pulmonary edema as well as COPD changes. He underwent office spirometry which indicated obstruction and possible restriction with some bronchodilator reversibility and an FEV1 less than 80% predicted. Subsequently he was referred to an echocardiogram which was performed at [facility]. I reviewed the interpretation which indicates mild LV dilatation and mild global hypokinesis with an EF of 45-50%. There was marketed dilatation of the left atrium, mild aortic insufficiency and severe aortic stenosis. The peak and mean gradients across the valve or 71 mmHg and 50 mmHg, respectively with a calculated aortic valve area of 0.62 cm. There was also mild to moderate mitral regurgitation. The study was performed on 12/01/2015. Over the past month [name] is become somewhat more short of breath, but is still able to do physical activities. He denies any chest pain. Family history indicates death by natural causes of family members but no known coronary disease.  02/20/2016  [name] today returns today for hospital follow-up. He underwent fairly urgent cardiac catheterization by myself, the results of which were as follows:   Mid RCA lesion, 40 %stenosed.  Prox RCA lesion, 10 %stenosed.  Hemodynamic  findings consistent with moderate pulmonary hypertension, mitral valve regurgitation and aortic valve stenosis.   Non-obstructive CAD. Mildly reduced cardiac index. PA 58%, PCWP 28,  Mean PA 45. BNP was 1100 and CXR shows pulmonary edema. A repeat echocardiogram showed that EF has decreased even lower to 20-25%. He was admitted for diuresis and CT surgery was consulted for aortic valve replacement. After diuresis he underwent aortic valve replacement with a 25 mm pericardial Edwards magna ease valve on 01/29/2016, at which time he was found to have severe bicuspid aortic valve stenosis and insufficiency. He also had significant bilateral pleural effusions which were drained with chest tubes. Recovery was uneventful except that he developed atrial fibrillation on or about 02/01/2016. He was started on oral amiodarone and warfarin. After discharge he is INR remains subtherapeutic, but recently had increased to 1.9. A repeat INR checked today was 2.5. An EKG in the office today demonstrates atrial flutter with variable A-V response at 82. There is LVH by voltage. Mr. Gutman reports reports feeling much better. He is not symptomatic with his atrial flutter. He is about to start cardiac rehabilitation at [facility].  03/22/2016  [name] returns today for follow-up. He remains in atrial flutter which is rate controlled. Overall he seems to be recovering from surgery. He's now had at least one month of consistent therapeutic INRs between 2 and 3. He recently saw Dr. Donata Clay in follow-up who felt that he was doing well from a cardiac standpoint. We had discussed cardioversion as an option if he remained in atrial flutter which is persistent. I did discuss risks and benefits  of electrical cardioversion with him today and he is agreeable to proceed.  05/21/2016  [name] returns for follow-up today. He is maintaining sinus rhythm on low dose amiodarone after cardioversion. He reports some improvement in  his symptoms. He still reports daytime fatigue and is noted to snore - I wonder if he has OSA. He says he is almost out of amiodarone. He denies worsening dyspnea or weight gain.  08/28/2016  [name] is seen today in follow-up. Overall he feels well although he recently has noted elevated blood pressure. He was seen at his pulmonologist office and blood pressure was over 200 systolic and greater than 100 diastolic. Today blood pressure was 182/94. Previously blood pressure had been well controlled. He has had an improvement in LV function after surgery - echo performed this month showed an improvement in LVEF up to 45-50% however it is not totally normalized. He denies any recurrent atrial fibrillation since he underwent recent cardioversion. He is on amiodarone but has weaned down to 200 mg daily. EKG today shows sinus rhythm with mild lateral ST and T-wave changes. He also reports today that he's been having some problems with left lower extremity pain. Particularly notes that after walking about 150 yards he gets some pain in his left calf. He noted this during cardiac rehabilitation and it does seem to persist. He's concerned about PAD.  10/03/2016  [name] is seen today follow-up. He reports that his blood pressure recently has been very elevated. Generally at home it runs between 1 6170 systolic. He says he doesn't feel well when it's at that level. He recently had peripheral lower extremity arterial Dopplers which indicated a reduced ABI 0.61 on the left and 1.1 on the right. He has been complaining of left lower extremity claudication. I've gone ahead and referred him to Dr. Kirke Corin for evaluation of this. He is also complaining of a lot of gas and bloating. He says he's tried simethicone without relief. His bowel movements are fairly normal. He says he thinks is related to medications however it is not clear which medication would cause this and I do not typically hear complaints of this. He  could've been related to surgery.  01/10/2017  [name] returns today for follow-up. He is now feeling very well. He denies any chest pain or worsening shortness of breath. He was having left lower extremity claudication and was seen by Dr. Benard Rink. He ultimately underwent atherectomy and patch angioplasty by Dr. Myra Gianotti and has had complete improvement in his left lower extremity claudication. He initially had some swelling but this is improved. He's had good wound healing in the left groin. He is scheduled to follow-up with Dr. Myra Gianotti in August. He would have repeat lower extremity arterial Dopplers in September and see Dr. Benard Rink. He has had some weight gain and is looking forward to starting to do more exercise. His last echo was in February 2018 which showed improvement in EF to 45-50%. He denies any bleeding problems on Xarelto. He was started on low-dose atorvastatin his LDL-C was above 70 (80, in 12/2015). He has not had a repeat lipid profile since then.  07/11/2017  Mr. Hendriks was seen in follow-up today. He is without complaint. He is doing well after valve replacement. He has been working with Dr. Kirke Corin and ultimately underwent intervention to the ostial profunda and SFA by Dr. Myra Gianotti of [facility], which required endarterectomy and patch angioplasty. Since then he has had significant improvement in his symptoms of  claudication. He is also noted to have gained weight since his last visit - today he is 219 lbs, up from 213 lbs in July.   PMHx:  Past Medical History:  Diagnosis Date  . Asthma   . CAD (coronary artery disease) 01/23/2016   minor CAD at cath-40% RCA  . CHF (congestive heart failure) (HCC)    aortic valve replacement  . COPD (chronic obstructive pulmonary disease) (HCC)    denies patient said that a pulmonologist said he said that he didnt have COPD  . Dyspnea   . Dysrhythmia    hx afib  . Heart murmur    had a murmur before the aortic valve replacement, no murmur according  to dr. Rennis Golden per patient  . Hypertension   . Non-ischemic cardiomyopathy (HCC) 01/23/2016   EF 30-35%  . Pneumonia   . Severe aortic stenosis   . SOBOE (shortness of breath on exertion)     Past Surgical History:  Procedure Laterality Date  . ABDOMINAL AORTOGRAM W/LOWER EXTREMITY N/A 10/16/2016   Procedure: Abdominal Aortogram w/Lower Extremity;  Surgeon: [name], MD;  Location: [facility] INVASIVE CV LAB;  Service: Cardiovascular;  Laterality: N/A;  . AORTIC VALVE REPLACEMENT N/A 01/29/2016   Procedure: AORTIC VALVE REPLACEMENT (AVR) with Magna Ease size 68mm;  Surgeon: [name], MD;  Location: [facility] OR;  Service: Open Heart Surgery;  Laterality: N/A;  . CARDIAC CATHETERIZATION N/A 01/23/2016   Procedure: Right/Left Heart Cath and Coronary Angiography;  Surgeon: Chrystie Nose, MD;  Location: [facility] INVASIVE CV LAB;  Service: Cardiovascular;  Laterality: N/A;  . CARDIOVERSION N/A 03/26/2016   Procedure: CARDIOVERSION;  Surgeon: Chrystie Nose, MD;  Location: [facility] ENDOSCOPY;  Service: Cardiovascular;  Laterality: N/A;  . CATARACT EXTRACTION  12/2002 & 09/2007  . ENDARTERECTOMY FEMORAL Left 11/01/2016   Procedure: ENDARTERECTOMY FEMORAL;  Surgeon: [name], MD;  Location: [facility] OR;  Service: Vascular;  Laterality: Left;  . PATCH ANGIOPLASTY Left 11/01/2016   Procedure: PATCH ANGIOPLASTY;  Surgeon: [name], MD;  Location: [facility] OR;  Service: Vascular;  Laterality: Left;  . SHOULDER SURGERY Right 1971  . TEE WITHOUT CARDIOVERSION N/A 01/29/2016   Procedure: TRANSESOPHAGEAL ECHOCARDIOGRAM (TEE);  Surgeon: [name], MD;  Location: [facility] OR;  Service: Open Heart Surgery;  Laterality: N/A;    FAMHx:  Family History  Problem Relation Age of Onset  . Breast cancer Mother   . Breast cancer Sister     SOCHx:   reports that he quit smoking about 17 months ago. His smoking use included cigarettes. He has a 45.00 pack-year smoking history. He has quit using smokeless tobacco. He reports  that he drinks about 7.2 oz of alcohol per week. He reports that he does not use drugs.  ALLERGIES:  Allergies  Allergen Reactions  . No Known Allergies     ROS: Pertinent items noted in HPI and remainder of comprehensive ROS otherwise negative.  HOME MEDS: Current Outpatient Medications  Medication Sig Dispense Refill  . atorvastatin (LIPITOR) 10 MG tablet Take 1 tablet (10 mg total) by mouth daily. 30 tablet 11  . BREO ELLIPTA 100-25 MCG/INH AEPB USE 1 INHALATION DAILY (Patient taking differently: Inhale 1 puff into the lungs daily. )  0  . carvedilol (COREG) 3.125 MG tablet TAKE 1 TABLET (3.125 MG TOTAL) BY MOUTH 2 (TWO) TIMES DAILY WITH A MEAL. 180 tablet 1  . finasteride (PROSCAR) 5 MG tablet Take 1 tablet (5 mg total) by mouth daily.  30 tablet 1  . fluticasone (FLONASE) 50 MCG/ACT nasal spray TAKE 1 SPRAY EACH NOSTRIL 2 TIMES A DAY  3  . furosemide (LASIX) 40 MG tablet TAKE 1 TABLET (40 MG TOTAL) BY MOUTH DAILY. 90 tablet 1  . losartan (COZAAR) 100 MG tablet Take 1 tablet (100 mg total) by mouth daily. 90 tablet 3  . PROAIR RESPICLICK 108 (90 Base) MCG/ACT AEPB Inhale 2 puffs into the lungs every 6 (six) hours as needed (shortness of breath).   3  . tamsulosin (FLOMAX) 0.4 MG CAPS capsule Take 0.4 mg by mouth daily.  1  . XARELTO 20 MG TABS tablet TAKE 1 TABLET (20 MG TOTAL) BY MOUTH DAILY WITH SUPPER. 30 tablet 3   No current facility-administered medications for this visit.     LABS/IMAGING: No results found for this or any previous visit (from the past 48 hour(s)). No results found.  WEIGHTS: Wt Readings from Last 3 Encounters:  07/11/17 219 lb (99.3 kg)  03/18/17 214 lb (97.1 kg)  02/24/17 214 lb 9.6 oz (97.3 kg)    VITALS: BP 121/74   Pulse (!) 58   Resp 16   Ht 5\' 10"  (1.778 m)   Wt 219 lb (99.3 kg)   SpO2 96%   BMI 31.42 kg/m   EXAM: General appearance: alert, no distress and mildly obese Neck: no carotid bruit, no JVD and thyroid not enlarged,  symmetric, no tenderness/mass/nodules Lungs: clear to auscultation bilaterally Heart: regular rate and rhythm Abdomen: soft, non-tender; bowel sounds normal; no masses,  no organomegaly Extremities: edema Trace left lower extremity edema Pulses: 2+ and symmetric Skin: Skin color, texture, turgor normal. No rashes or lesions Neurologic: Grossly normal Psych: Pleasant  EKG: Sinus bradycardia at 59 - personally reviewed  ASSESSMENT: 1. Severe symptomatic bicuspid calcific aortic stenosis - s/p 25 mm Edwards MagnaEase tissue valve (12/2015) 2. Mild to moderate mitral regurgitation with "marked" left atrial enlargement 3. COPD - quit smoking (2017) 4. Dilated cardiomyopathy with EF 45-50% -> reduced to 20-25% perioperatively, now improved to 45-50% (08/2016) 5. Persistent postoperative atrial flutter-CHADSVASC score of 3 (on warfarin), s/p cardioversion and maintaining sinus 6. Claudication- status post left common femoral, superficial femoral, profunda femoral and external iliac endarterectomy and bovine pericardial patch angioplasty (10/2016) 7. Hypertension 8. Gas/bloating  PLAN: 1.   Mr. Fanguy denies any problems with chest pain, dyspnea or claudication. BP is controlled. He has had weight gain and will need to work on more exercise and calorie restriction - some of this may be due to smoking cessation - he remains quit since his surgery.  Follow-up with me in 6 months.  Chrystie Nose, MD, [facility], FACP  [place]  [facility]  Medical Director of the [facility]  [facility] Diplomate of the American Board of Clinical Lipidology Attending Cardiologist  Direct Dial: [phone]  Fax: [phone]  Website:  [email or web address] 07/12/2017, 5:56 PM

## 2017-08-12 ENCOUNTER — Telehealth: Payer: Self-pay | Admitting: *Deleted

## 2017-08-12 DIAGNOSIS — I739 Peripheral vascular disease, unspecified: Secondary | ICD-10-CM

## 2017-08-12 NOTE — Telephone Encounter (Signed)
Patient called and notified that it was time for a 6 month follow up with Dr. Kirke Corin and for repeat dopplers. He has been informed that scheduling will reach out to him to get this scheduled sometime next month. Orders have been placed.

## 2017-08-15 ENCOUNTER — Other Ambulatory Visit: Payer: Self-pay | Admitting: Internal Medicine

## 2017-08-15 NOTE — Telephone Encounter (Signed)
REFILL 

## 2017-09-05 ENCOUNTER — Ambulatory Visit (HOSPITAL_COMMUNITY)
Admission: RE | Admit: 2017-09-05 | Discharge: 2017-09-05 | Disposition: A | Discharge status: Home / Self Care | Payer: BLUE CROSS/BLUE SHIELD | Source: Ambulatory Visit | Attending: Cardiology | Admitting: Cardiology

## 2017-09-05 DIAGNOSIS — I739 Peripheral vascular disease, unspecified: Secondary | ICD-10-CM | POA: Diagnosis not present

## 2017-09-05 DIAGNOSIS — Z9889 Other specified postprocedural states: Secondary | ICD-10-CM | POA: Insufficient documentation

## 2017-09-05 DIAGNOSIS — I70202 Unspecified atherosclerosis of native arteries of extremities, left leg: Secondary | ICD-10-CM | POA: Diagnosis not present

## 2017-09-09 ENCOUNTER — Telehealth: Payer: Self-pay | Admitting: *Deleted

## 2017-09-09 DIAGNOSIS — I739 Peripheral vascular disease, unspecified: Secondary | ICD-10-CM

## 2017-09-09 NOTE — Telephone Encounter (Signed)
-----   Message from [name], MD sent at 09/08/2017  1:14 PM EDT ----- Inform patient that Doppler showed normal ABI and no significant change from before.  Keep follow-up with me in April and repeat studies in 1 year.

## 2017-09-09 NOTE — Telephone Encounter (Signed)
Patient made aware of results and verbalized his understanding. Orders have been placed for LEA and ABI in one year.

## 2017-09-29 ENCOUNTER — Other Ambulatory Visit: Payer: Self-pay | Admitting: Cardiovascular Disease

## 2017-09-29 ENCOUNTER — Other Ambulatory Visit: Payer: Self-pay | Admitting: *Deleted

## 2017-09-29 NOTE — Telephone Encounter (Signed)
Refill Request.  

## 2017-09-29 NOTE — Telephone Encounter (Signed)
Rx(s) sent to pharmacy electronically.  

## 2017-09-30 ENCOUNTER — Encounter: Payer: Self-pay | Admitting: Cardiovascular Disease

## 2017-09-30 ENCOUNTER — Ambulatory Visit: Payer: BLUE CROSS/BLUE SHIELD | Admitting: Cardiovascular Disease

## 2017-09-30 VITALS — BP 122/68 | HR 55 | Ht 70.0 in | Wt 211.4 lb

## 2017-09-30 DIAGNOSIS — E785 Hyperlipidemia, unspecified: Secondary | ICD-10-CM | POA: Diagnosis not present

## 2017-09-30 DIAGNOSIS — Z952 Presence of prosthetic heart valve: Secondary | ICD-10-CM

## 2017-09-30 DIAGNOSIS — I5022 Chronic systolic (congestive) heart failure: Secondary | ICD-10-CM | POA: Diagnosis not present

## 2017-09-30 DIAGNOSIS — I739 Peripheral vascular disease, unspecified: Secondary | ICD-10-CM | POA: Diagnosis not present

## 2017-09-30 NOTE — Patient Instructions (Signed)

## 2017-09-30 NOTE — Progress Notes (Signed)
Cardiology Office Note   Date:  09/30/2017   ID:  [name], DOB Jul 01, 1956, MRN [id number]  PCP:  Nonnie Done., MD  Cardiologist:  Dr. Rennis Golden  No chief complaint on file.     History of Present Illness: [name] is a 62 y.o. male who is here today for a follow-up visit regarding peripheral arterial disease.  The patient has known history of chronic systolic heart failure due to nonischemic cardiomyopathy in the setting of severe aortic stenosis due to bicuspid aortic valve. He underwent successful aortic valve replacement in July 2017 with subsequent improvement in LV systolic function. He has other chronic medical conditions that include previous tobacco use. He quit smoking in June 2018. He is not diabetic. The patient is on anticoagulation with Xarelto for postoperative atrial flutter. The patient is status post left common femoral artery endarterectomy in 2018 for severe left leg claudication. Left leg claudication resolved.  He has been doing extremely well with no chest pain, shortness of breath or palpitations.  No leg claudication.  Past Medical History:  Diagnosis Date  . Asthma   . CAD (coronary artery disease) 01/23/2016   minor CAD at cath-40% RCA  . CHF (congestive heart failure) (HCC)    aortic valve replacement  . COPD (chronic obstructive pulmonary disease) (HCC)    denies patient said that a pulmonologist said he said that he didnt have COPD  . Dyspnea   . Dysrhythmia    hx afib  . Heart murmur    had a murmur before the aortic valve replacement, no murmur according to dr. Rennis Golden per patient  . Hypertension   . Non-ischemic cardiomyopathy (HCC) 01/23/2016   EF 30-35%  . Pneumonia   . Severe aortic stenosis   . SOBOE (shortness of breath on exertion)     Past Surgical History:  Procedure Laterality Date  . ABDOMINAL AORTOGRAM W/LOWER EXTREMITY N/A 10/16/2016   Procedure: Abdominal Aortogram w/Lower Extremity;  Surgeon: [name],  MD;  Location: [facility] INVASIVE CV LAB;  Service: Cardiovascular;  Laterality: N/A;  . AORTIC VALVE REPLACEMENT N/A 01/29/2016   Procedure: AORTIC VALVE REPLACEMENT (AVR) with Magna Ease size 25mm;  Surgeon: [name], MD;  Location: [facility] OR;  Service: Open Heart Surgery;  Laterality: N/A;  . CARDIAC CATHETERIZATION N/A 01/23/2016   Procedure: Right/Left Heart Cath and Coronary Angiography;  Surgeon: Chrystie Nose, MD;  Location: [facility] INVASIVE CV LAB;  Service: Cardiovascular;  Laterality: N/A;  . CARDIOVERSION N/A 03/26/2016   Procedure: CARDIOVERSION;  Surgeon: Chrystie Nose, MD;  Location: [facility] ENDOSCOPY;  Service: Cardiovascular;  Laterality: N/A;  . CATARACT EXTRACTION  12/2002 & 09/2007  . ENDARTERECTOMY FEMORAL Left 11/01/2016   Procedure: ENDARTERECTOMY FEMORAL;  Surgeon: [name], MD;  Location: [facility] OR;  Service: Vascular;  Laterality: Left;  . PATCH ANGIOPLASTY Left 11/01/2016   Procedure: PATCH ANGIOPLASTY;  Surgeon: [name], MD;  Location: [facility] OR;  Service: Vascular;  Laterality: Left;  . SHOULDER SURGERY Right 1971  . TEE WITHOUT CARDIOVERSION N/A 01/29/2016   Procedure: TRANSESOPHAGEAL ECHOCARDIOGRAM (TEE);  Surgeon: [name], MD;  Location: [facility] OR;  Service: Open Heart Surgery;  Laterality: N/A;     Current Outpatient Medications  Medication Sig Dispense Refill  . atorvastatin (LIPITOR) 10 MG tablet Take 1 tablet (10 mg total) by mouth daily. 30 tablet 11  . BREO ELLIPTA 100-25 MCG/INH AEPB USE 1 INHALATION DAILY (Patient taking differently: Inhale 1 puff  into the lungs daily. )  0  . carvedilol (COREG) 3.125 MG tablet TAKE 1 TABLET (3.125 MG TOTAL) BY MOUTH 2 (TWO) TIMES DAILY WITH A MEAL. 180 tablet 2  . finasteride (PROSCAR) 5 MG tablet Take 1 tablet (5 mg total) by mouth daily. 30 tablet 1  . fluticasone (FLONASE) 50 MCG/ACT nasal spray TAKE 1 SPRAY EACH NOSTRIL 2 TIMES A DAY  3  . furosemide (LASIX) 40 MG tablet TAKE 1 TABLET BY MOUTH EVERY DAY 90 tablet 1  .  losartan (COZAAR) 100 MG tablet Take 1 tablet (100 mg total) by mouth daily. 90 tablet 3  . PROAIR RESPICLICK 108 (90 Base) MCG/ACT AEPB Inhale 2 puffs into the lungs every 6 (six) hours as needed (shortness of breath).   3  . tamsulosin (FLOMAX) 0.4 MG CAPS capsule Take 0.4 mg by mouth daily.  1  . XARELTO 20 MG TABS tablet TAKE 1 TABLET (20 MG TOTAL) BY MOUTH DAILY WITH SUPPER. 30 tablet 3   No current facility-administered medications for this visit.     Allergies:   No known allergies    Social History:  The patient  reports that he quit smoking about 20 months ago. His smoking use included cigarettes. He has a 45.00 pack-year smoking history. He has quit using smokeless tobacco. He reports that he drinks about 7.2 oz of alcohol per week. He reports that he does not use drugs.   Family History:  The patient's family history includes Breast cancer in his mother and sister.    ROS:  Please see the history of present illness.   Otherwise, review of systems are positive for none.   All other systems are reviewed and negative.    PHYSICAL EXAM: VS:  BP 122/68   Pulse (!) 55   Ht 5\' 10"  (1.778 m)   Wt 211 lb 6.4 oz (95.9 kg)   BMI 30.33 kg/m  , BMI Body mass index is 30.33 kg/m. GEN: Well nourished, well developed, in no acute distress  HEENT: normal  Neck: no JVD, carotid bruits, or masses Cardiac: RRR; no murmurs, rubs, or gallops,no edema  Respiratory:  clear to auscultation bilaterally, normal work of breathing GI: soft, nontender, nondistended, + BS MS: no deformity or atrophy  Skin: warm and dry, no rash Neuro:  Strength and sensation are intact Psych: euthymic mood, full affect   EKG:  EKG is not ordered today.    Recent Labs: 11/02/2016: Hemoglobin 14.6; Platelets 131 01/10/2017: ALT 38; BUN 14; Creatinine, Ser 0.85; NT-Pro BNP 195; Potassium 5.3; Sodium 140    Lipid Panel    Component Value Date/Time   CHOL 138 01/10/2017 0941   TRIG 63 01/10/2017 0941   HDL  68 01/10/2017 0941   CHOLHDL 2.0 01/10/2017 0941   CHOLHDL 2.4 01/19/2016 1029   VLDL 11 01/19/2016 1029   LDLCALC 57 01/10/2017 0941      Wt Readings from Last 3 Encounters:  09/30/17 211 lb 6.4 oz (95.9 kg)  07/11/17 219 lb (99.3 kg)  03/18/17 214 lb (97.1 kg)       PAD Screen 10/08/2016  Previous PAD dx? No  Previous surgical procedure? No  Pain with walking? Yes  Subsides with rest? No  Feet/toe relief with dangling? No  Painful, non-healing ulcers? No  Extremities discolored? No      ASSESSMENT AND PLAN:  1.  Peripheral arterial disease: Status post left common femoral artery endarterectomy for severe lifestyle limiting left calf claudication.  No recurrent claudication. Vascular studies recently showed normal ABI bilaterally. Duplex showed borderline left external iliac artery stenosis which is stable. Repeat study in 12 months.  2. Chronic systolic heart failure: He appears to be euvolemic on current dose of furosemide.  3. Status post aortic valve replacement with bioprosthetic valve. He seems stable.  4. Paroxysmal atrial flutter: Currently in sinus rhythm and on anticoagulation with Xarelto.  5. Hyperlipidemia: Continue treatment with atorvastatin . I reviewed most recent lipid profile which showed an LDL of 57.   Disposition:   FU with me in 12 months  Signed,  Lorine Bears, MD  09/30/2017 8:21 AM    [facility]

## 2017-10-06 ENCOUNTER — Other Ambulatory Visit: Payer: Self-pay | Admitting: Internal Medicine

## 2017-10-21 ENCOUNTER — Telehealth: Payer: Self-pay | Admitting: Internal Medicine

## 2017-10-21 MED ORDER — ATORVASTATIN CALCIUM 10 MG PO TABS: 10.0000 mg | ORAL_TABLET | Freq: Every day | ORAL | 3 refills | Status: DC

## 2017-10-21 NOTE — Telephone Encounter (Signed)
Rx request sent to pharmacy.  

## 2018-01-26 IMAGING — CT CT CHEST W/O CM
2 of 3 series · 15 of 36 positions shown, 18 images · non-contrast
Comparison: Chest x-ray 01/19/2016

CLINICAL DATA: Aortic stenosis.  Cough last few days.

EXAM:
CT CHEST WITHOUT CONTRAST
TECHNIQUE: Multidetector CT imaging of the chest was performed following the
standard protocol without IV contrast.

[Series 2: chest w/o 2mm st · axial · non-contrast · 0.71mm/px · z∈[-722,-410]mm · 12 of 184 slices shown, 15 images]
[im 14/184  mediastinal]
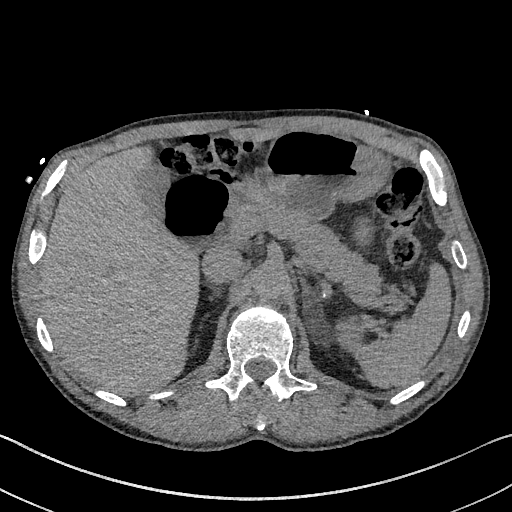
[im 14/184  lung]
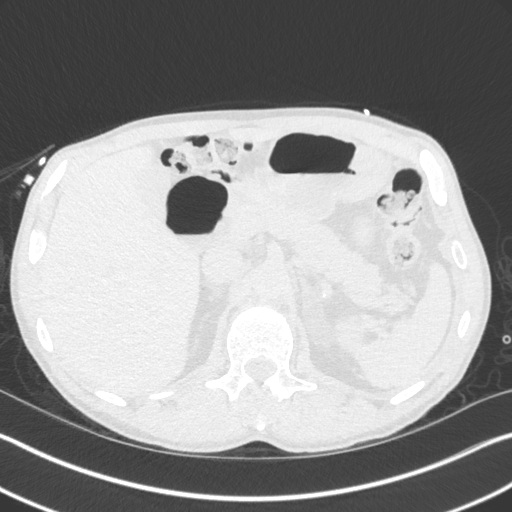
[im 28/184  lung]
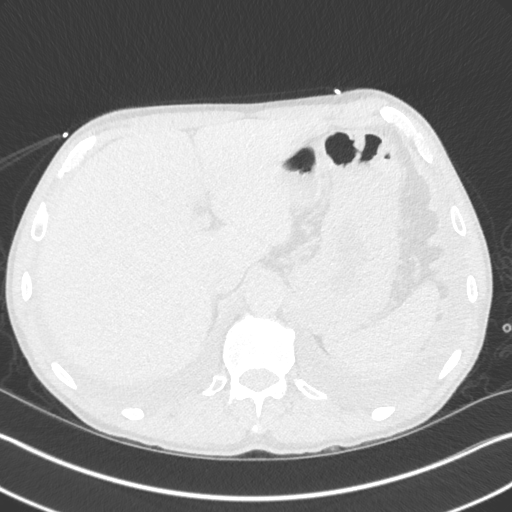
[im 41/184  lung]
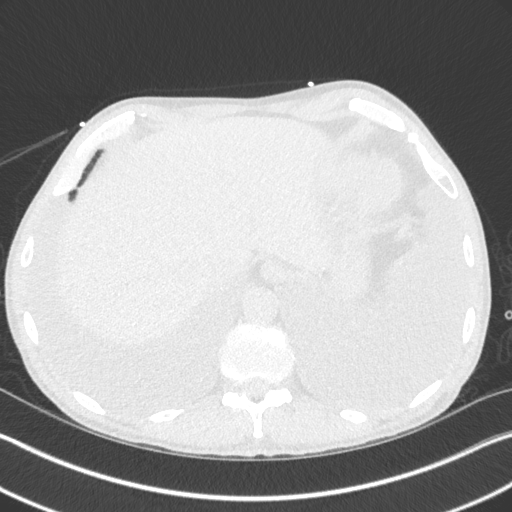
[im 55/184  lung]
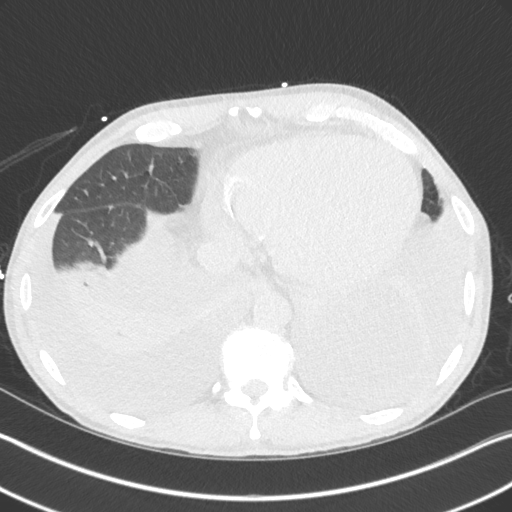
[im 68/184  mediastinal]
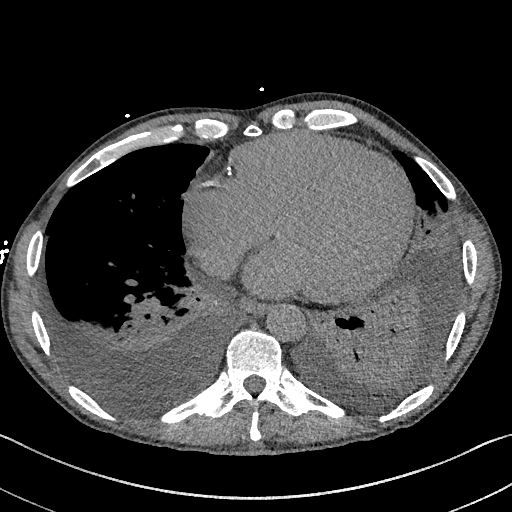
[im 68/184  lung]
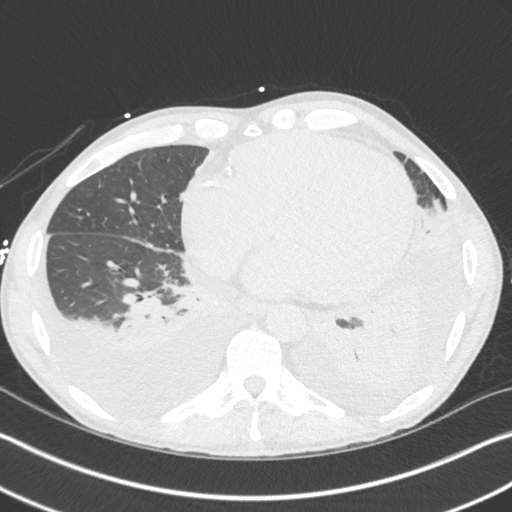
[im 82/184  lung]
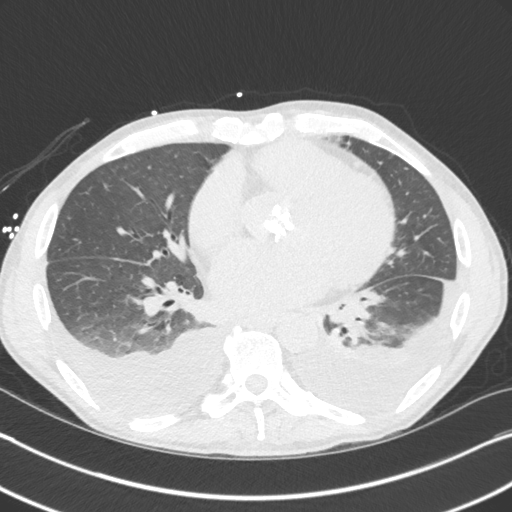
[im 102/184  lung]
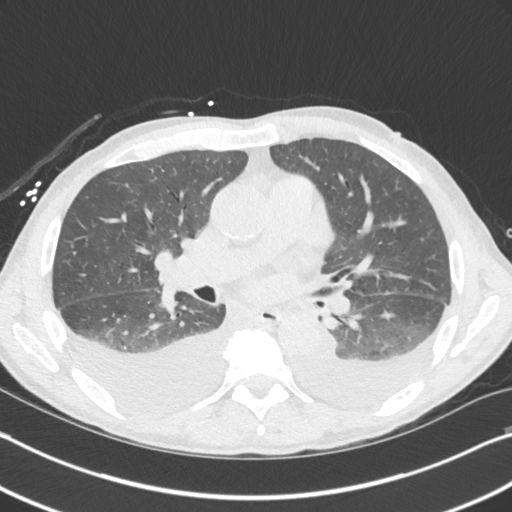
[im 116/184  lung]
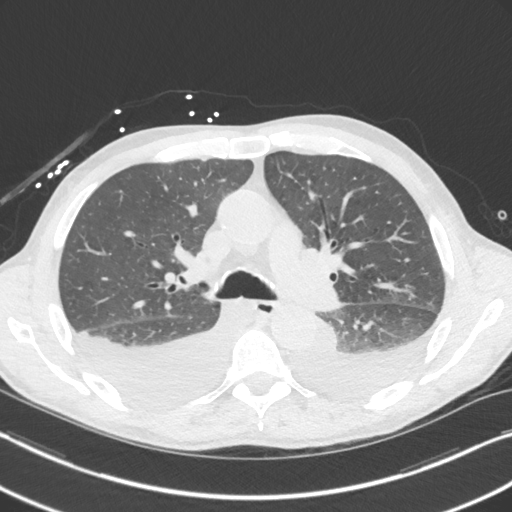
[im 129/184  mediastinal]
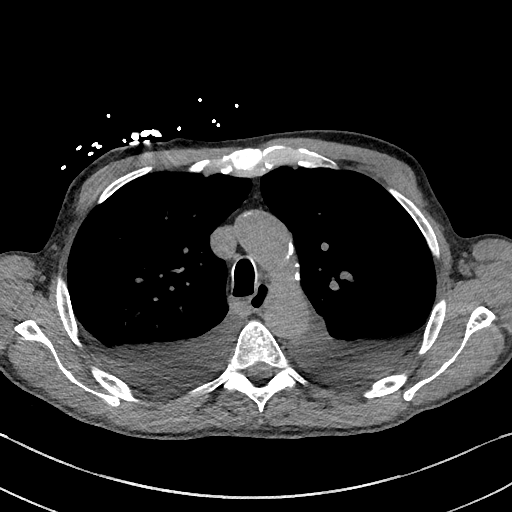
[im 129/184  lung]
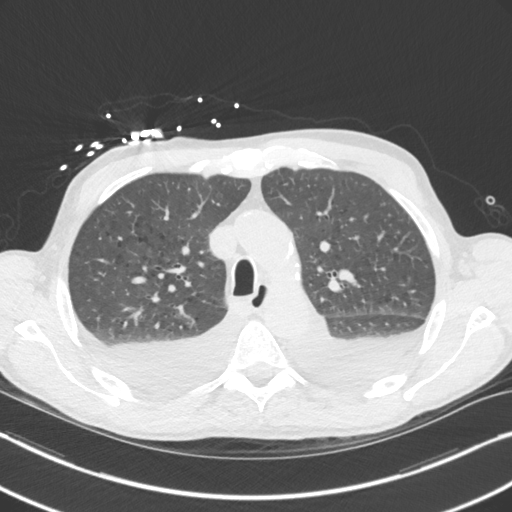
[im 143/184  lung]
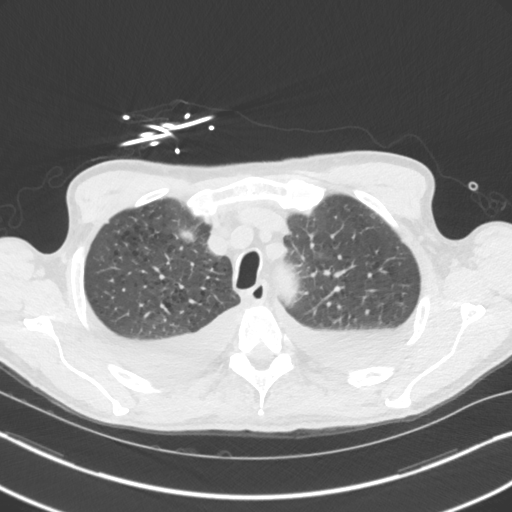
[im 156/184  lung]
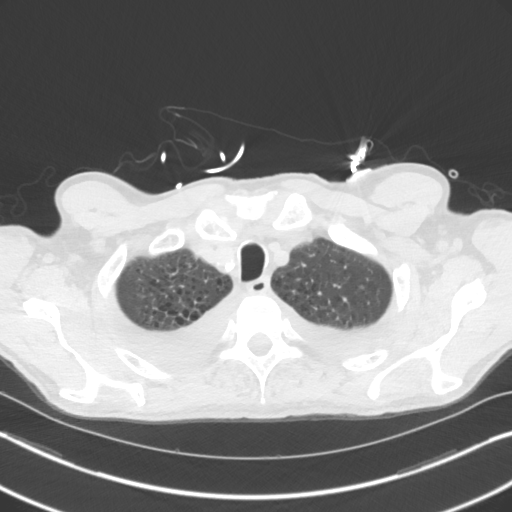
[im 170/184  lung]
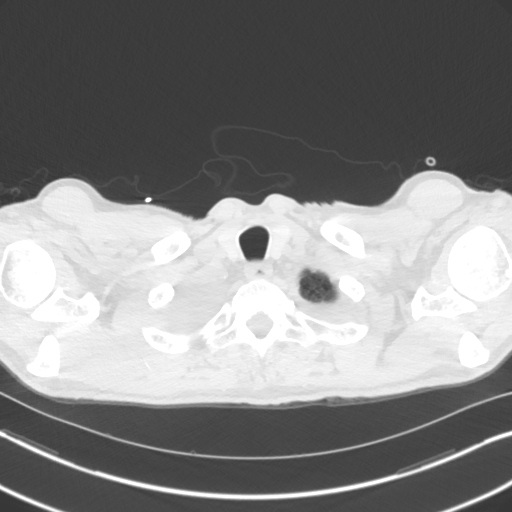

[Series 4: chest w/o 3mm st cor · coronal · non-contrast · 0.76mm/px · 3 of 79 slices shown]
[im 16/79  lung]
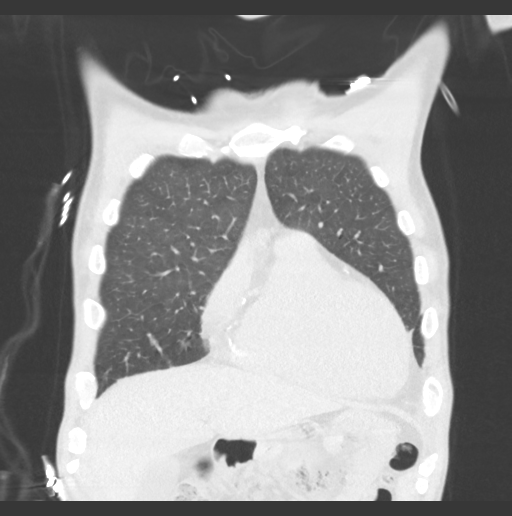
[im 32/79  lung]
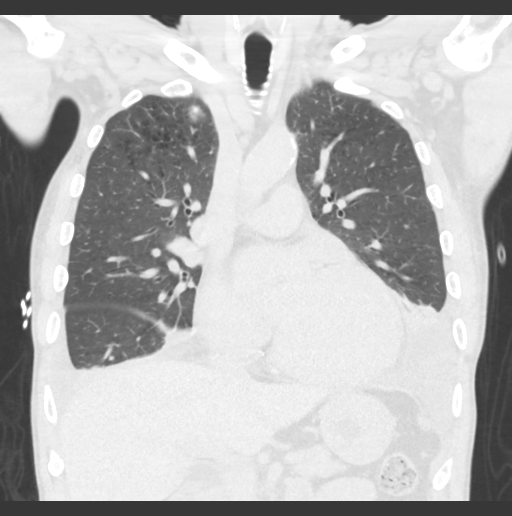
[im 47/79  lung]
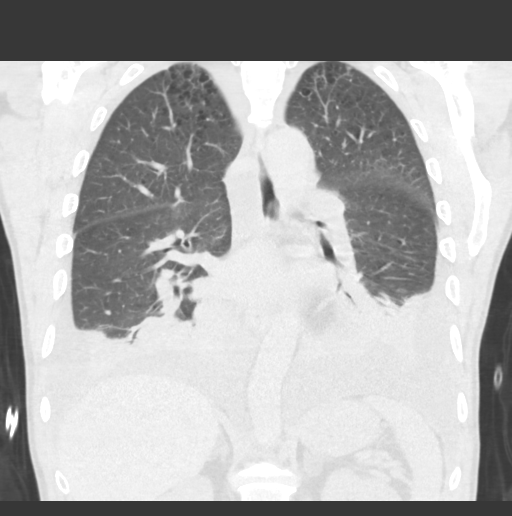

[15 of 36 positions shown; findings below may reference images not displayed]

FINDINGS: Cardiovascular: Mild cardiomegaly. Mild calcified plaque over the
left anterior descending, lateral circumflex and right coronary
arteries. Calcified plaque over the thoracic aorta. Moderate
calcification over the region of the aortic valve. Mild ectasia of
the ascending thoracic aorta measuring 3.7 cm in AP diameter.

Mediastinum/Nodes: No mediastinal or hilar adenopathy. Remaining
mediastinal structures are within normal.

Lungs/Pleura: Lungs are adequately inflated with mild centrilobular
emphysematous disease over the mid to upper lungs. Tiny calcified
granuloma over the right upper lobe. Moderate size bilateral pleural
effusions with associated basilar consolidation likely compressive
atelectasis. Airways are within normal.

Upper Abdomen: 3.1 cm and 2.4 cm left adrenal adenomas. Minimal
calcified plaque over the abdominal aorta.

Musculoskeletal: Mild degenerate change of the spine.
IMPRESSION: Moderate bilateral pleural effusions with associated bibasilar
consolidation likely compressive atelectasis.

Minimal emphysematous disease.

Mild cardiomegaly with atherosclerotic coronary artery disease.
Moderate calcification over the region of the aortic valve. Aortic
atherosclerosis.

Mild ectasia of the ascending thoracic aorta measuring 3.7 cm in AP
diameter. Recommend annual imaging followup by CTA or MRA. This
recommendation follows 9515
ACCF/AHA/AATS/ACR/ASA/SCA/MICHAUD/OLIVERIO/RAMIZ/DUPUIS Guidelines for the
Diagnosis and Management of Patients with Thoracic Aortic Disease.
Circulation.9515; 121: [id number].

Left adrenal adenomas.

## 2018-02-08 ENCOUNTER — Other Ambulatory Visit: Payer: Self-pay | Admitting: Internal Medicine

## 2018-04-03 ENCOUNTER — Ambulatory Visit: Payer: BLUE CROSS/BLUE SHIELD | Admitting: Internal Medicine

## 2018-04-03 ENCOUNTER — Encounter: Payer: Self-pay | Admitting: Internal Medicine

## 2018-04-03 VITALS — BP 108/60 | HR 58 | Ht 70.0 in | Wt 202.2 lb

## 2018-04-03 DIAGNOSIS — I480 Paroxysmal atrial fibrillation: Secondary | ICD-10-CM | POA: Diagnosis not present

## 2018-04-03 DIAGNOSIS — I428 Other cardiomyopathies: Secondary | ICD-10-CM | POA: Diagnosis not present

## 2018-04-03 DIAGNOSIS — Z952 Presence of prosthetic heart valve: Secondary | ICD-10-CM | POA: Diagnosis not present

## 2018-04-03 NOTE — Progress Notes (Signed)
OFFICE NOTE  Chief Complaint:  No complaints  Primary Care Physician: Nonnie Done., MD  HPI:  [name] is a 62 y.o. male he was kindly referred to me for evaluation of new onset shortness of breath. He saw his primary care provider who had been following him for an abnormal cardiac murmur in the past. In fact [name] had previously seen a cardiologist in [place], probably more than 5 years ago who did an echocardiogram and then she had valvular heart disease which could get worse over time. He had not followed up with his cardiologist. On presentation last month to his primary care provider he was notably short of breath and underwent a chest x-ray which showed some mild pulmonary edema as well as COPD changes. He underwent office spirometry which indicated obstruction and possible restriction with some bronchodilator reversibility and an FEV1 less than 80% predicted. Subsequently he was referred to an echocardiogram which was performed at [facility]. I reviewed the interpretation which indicates mild LV dilatation and mild global hypokinesis with an EF of 45-50%. There was marketed dilatation of the left atrium, mild aortic insufficiency and severe aortic stenosis. The peak and mean gradients across the valve or 71 mmHg and 50 mmHg, respectively with a calculated aortic valve area of 0.62 cm. There was also mild to moderate mitral regurgitation. The study was performed on 12/01/2015. Over the past month Mr. [name] is become somewhat more short of breath, but is still able to do physical activities. He denies any chest pain. Family history indicates death by natural causes of family members but no known coronary disease.  02/20/2016  Mr. [name] today returns today for hospital follow-up. He underwent fairly urgent cardiac catheterization by myself, the results of which were as follows:   Mid RCA lesion, 40 %stenosed.  Prox RCA lesion, 10 %stenosed.  Hemodynamic  findings consistent with moderate pulmonary hypertension, mitral valve regurgitation and aortic valve stenosis.   Non-obstructive CAD. Mildly reduced cardiac index. PA 58%, PCWP 28,  Mean PA 45. BNP was 1100 and CXR shows pulmonary edema. A repeat echocardiogram showed that EF has decreased even lower to 20-25%. He was admitted for diuresis and CT surgery was consulted for aortic valve replacement. After diuresis he underwent aortic valve replacement with a 25 mm pericardial Edwards magna ease valve on 01/29/2016, at which time he was found to have severe bicuspid aortic valve stenosis and insufficiency. He also had significant bilateral pleural effusions which were drained with chest tubes. Recovery was uneventful except that he developed atrial fibrillation on or about 02/01/2016. He was started on oral amiodarone and warfarin. After discharge he is INR remains subtherapeutic, but recently had increased to 1.9. A repeat INR checked today was 2.5. An EKG in the office today demonstrates atrial flutter with variable A-V response at 82. There is LVH by voltage. Mr. Gnau reports reports feeling much better. He is not symptomatic with his atrial flutter. He is about to start cardiac rehabilitation at [facility].  03/22/2016  Mr. [name] returns today for follow-up. He remains in atrial flutter which is rate controlled. Overall he seems to be recovering from surgery. He's now had at least one month of consistent therapeutic INRs between 2 and 3. He recently saw Dr. Donata Clay in follow-up who felt that he was doing well from a cardiac standpoint. We had discussed cardioversion as an option if he remained in atrial flutter which is persistent. I did discuss risks and benefits  of electrical cardioversion with him today and he is agreeable to proceed.  05/21/2016  Mr. [name] returns for follow-up today. He is maintaining sinus rhythm on low dose amiodarone after cardioversion. He reports some improvement in  his symptoms. He still reports daytime fatigue and is noted to snore - I wonder if he has OSA. He says he is almost out of amiodarone. He denies worsening dyspnea or weight gain.  08/28/2016  Mr. [name] is seen today in follow-up. Overall he feels well although he recently has noted elevated blood pressure. He was seen at his pulmonologist office and blood pressure was over 200 systolic and greater than 100 diastolic. Today blood pressure was 182/94. Previously blood pressure had been well controlled. He has had an improvement in LV function after surgery - echo performed this month showed an improvement in LVEF up to 45-50% however it is not totally normalized. He denies any recurrent atrial fibrillation since he underwent recent cardioversion. He is on amiodarone but has weaned down to 200 mg daily. EKG today shows sinus rhythm with mild lateral ST and T-wave changes. He also reports today that he's been having some problems with left lower extremity pain. Particularly notes that after walking about 150 yards he gets some pain in his left calf. He noted this during cardiac rehabilitation and it does seem to persist. He's concerned about PAD.  10/03/2016  Mr. [name] is seen today follow-up. He reports that his blood pressure recently has been very elevated. Generally at home it runs between 1 6170 systolic. He says he doesn't feel well when it's at that level. He recently had peripheral lower extremity arterial Dopplers which indicated a reduced ABI 0.61 on the left and 1.1 on the right. He has been complaining of left lower extremity claudication. I've gone ahead and referred him to Dr. Kirke Corin for evaluation of this. He is also complaining of a lot of gas and bloating. He says he's tried simethicone without relief. His bowel movements are fairly normal. He says he thinks is related to medications however it is not clear which medication would cause this and I do not typically hear complaints of this. He  could've been related to surgery.  01/10/2017  Mr. [name] returns today for follow-up. He is now feeling very well. He denies any chest pain or worsening shortness of breath. He was having left lower extremity claudication and was seen by Dr. Benard Rink. He ultimately underwent atherectomy and patch angioplasty by Dr. Myra Gianotti and has had complete improvement in his left lower extremity claudication. He initially had some swelling but this is improved. He's had good wound healing in the left groin. He is scheduled to follow-up with Dr. Myra Gianotti in August. He would have repeat lower extremity arterial Dopplers in September and see Dr. Benard Rink. He has had some weight gain and is looking forward to starting to do more exercise. His last echo was in February 2018 which showed improvement in EF to 45-50%. He denies any bleeding problems on Xarelto. He was started on low-dose atorvastatin his LDL-C was above 70 (80, in 12/2015). He has not had a repeat lipid profile since then.  07/11/2017  Mr. Hendriks was seen in follow-up today. He is without complaint. He is doing well after valve replacement. He has been working with Dr. Kirke Corin and ultimately underwent intervention to the ostial profunda and SFA by Dr. Myra Gianotti of [facility], which required endarterectomy and patch angioplasty. Since then he has had significant improvement in his symptoms of  claudication. He is also noted to have gained weight since his last visit - today he is 219 lbs, up from 213 lbs in July.   04/03/2018  Mr. Both is seen today in follow-up.  He continues to do well.  Recently he is continued to lose weight now down to 202 pounds from 219 in the beginning of the year.  He says he feels better and is been more active.  He is change his diet significantly.  He denies any chest pain or shortness of breath.  He denies any claudication although does get some achiness in his legs.  He does continue to have problems with sleep at night.  Mostly falling and  staying asleep.  He is using CPAP.  He has a nasal CPAP but often complains of dry mouth and I suspect would probably benefit from a full facemask.  He said that he slept much better last night after taking a Tylenol PM and I reiterated it could be okay for him to continue to do that.  PMHx:  Past Medical History:  Diagnosis Date  . Asthma   . CAD (coronary artery disease) 01/23/2016   minor CAD at cath-40% RCA  . CHF (congestive heart failure) (HCC)    aortic valve replacement  . COPD (chronic obstructive pulmonary disease) (HCC)    denies patient said that a pulmonologist said he said that he didnt have COPD  . Dyspnea   . Dysrhythmia    hx afib  . Heart murmur    had a murmur before the aortic valve replacement, no murmur according to dr. Rennis Golden per patient  . Hypertension   . Non-ischemic cardiomyopathy (HCC) 01/23/2016   EF 30-35%  . Pneumonia   . Severe aortic stenosis   . SOBOE (shortness of breath on exertion)     Past Surgical History:  Procedure Laterality Date  . ABDOMINAL AORTOGRAM W/LOWER EXTREMITY N/A 10/16/2016   Procedure: Abdominal Aortogram w/Lower Extremity;  Surgeon: [name], MD;  Location: [facility] INVASIVE CV LAB;  Service: Cardiovascular;  Laterality: N/A;  . AORTIC VALVE REPLACEMENT N/A 01/29/2016   Procedure: AORTIC VALVE REPLACEMENT (AVR) with Magna Ease size 25mm;  Surgeon: [name], MD;  Location: [facility] OR;  Service: Open Heart Surgery;  Laterality: N/A;  . CARDIAC CATHETERIZATION N/A 01/23/2016   Procedure: Right/Left Heart Cath and Coronary Angiography;  Surgeon: Chrystie Nose, MD;  Location: [facility] INVASIVE CV LAB;  Service: Cardiovascular;  Laterality: N/A;  . CARDIOVERSION N/A 03/26/2016   Procedure: CARDIOVERSION;  Surgeon: Chrystie Nose, MD;  Location: [facility] ENDOSCOPY;  Service: Cardiovascular;  Laterality: N/A;  . CATARACT EXTRACTION  12/2002 & 09/2007  . ENDARTERECTOMY FEMORAL Left 11/01/2016   Procedure: ENDARTERECTOMY FEMORAL;  Surgeon: [name], MD;  Location: [facility] OR;  Service: Vascular;  Laterality: Left;  . PATCH ANGIOPLASTY Left 11/01/2016   Procedure: PATCH ANGIOPLASTY;  Surgeon: [name], MD;  Location: [facility] OR;  Service: Vascular;  Laterality: Left;  . SHOULDER SURGERY Right 1971  . TEE WITHOUT CARDIOVERSION N/A 01/29/2016   Procedure: TRANSESOPHAGEAL ECHOCARDIOGRAM (TEE);  Surgeon: [name], MD;  Location: [facility] OR;  Service: Open Heart Surgery;  Laterality: N/A;    FAMHx:  Family History  Problem Relation Age of Onset  . Breast cancer Mother   . Breast cancer Sister     SOCHx:   reports that he quit smoking about 2 years ago. His smoking use included cigarettes. He has a 45.00 pack-year smoking  history. He has quit using smokeless tobacco. He reports that he drinks about 12.0 standard drinks of alcohol per week. He reports that he does not use drugs.  ALLERGIES:  Allergies  Allergen Reactions  . No Known Allergies     ROS: Pertinent items noted in HPI and remainder of comprehensive ROS otherwise negative.  HOME MEDS: Current Outpatient Medications  Medication Sig Dispense Refill  . atorvastatin (LIPITOR) 10 MG tablet Take 1 tablet (10 mg total) by mouth daily. 90 tablet 3  . BREO ELLIPTA 100-25 MCG/INH AEPB USE 1 INHALATION DAILY (Patient taking differently: Inhale 1 puff into the lungs daily. )  0  . carvedilol (COREG) 3.125 MG tablet TAKE 1 TABLET (3.125 MG TOTAL) BY MOUTH 2 (TWO) TIMES DAILY WITH A MEAL. 180 tablet 2  . finasteride (PROSCAR) 5 MG tablet Take 1 tablet (5 mg total) by mouth daily. 30 tablet 1  . fluticasone (FLONASE) 50 MCG/ACT nasal spray TAKE 1 SPRAY EACH NOSTRIL 2 TIMES A DAY  3  . furosemide (LASIX) 40 MG tablet TAKE 1 TABLET BY MOUTH EVERY DAY 90 tablet 2  . losartan (COZAAR) 100 MG tablet TAKE 1 TABLET BY MOUTH EVERY DAY 90 tablet 2  . tamsulosin (FLOMAX) 0.4 MG CAPS capsule Take 0.4 mg by mouth daily.  1  . XARELTO 20 MG TABS tablet TAKE 1 TABLET (20 MG TOTAL) BY MOUTH  DAILY WITH SUPPER. 90 tablet 1   No current facility-administered medications for this visit.     LABS/IMAGING: No results found for this or any previous visit (from the past 48 hour(s)). No results found.  WEIGHTS: Wt Readings from Last 3 Encounters:  04/03/18 202 lb 3.2 oz (91.7 kg)  09/30/17 211 lb 6.4 oz (95.9 kg)  07/11/17 219 lb (99.3 kg)    VITALS: BP 108/60   Pulse (!) 58   Ht 5\' 10"  (1.778 m)   Wt 202 lb 3.2 oz (91.7 kg)   BMI 29.01 kg/m   EXAM: General appearance: alert, no distress and mildly obese Neck: no carotid bruit, no JVD and thyroid not enlarged, symmetric, no tenderness/mass/nodules Lungs: clear to auscultation bilaterally Heart: regular rate and rhythm Abdomen: soft, non-tender; bowel sounds normal; no masses,  no organomegaly Extremities: edema Trace left lower extremity edema Pulses: 2+ and symmetric Skin: Skin color, texture, turgor normal. No rashes or lesions Neurologic: Grossly normal Psych: Pleasant  EKG: Sinus bradycardia 58, poor R wave progression-personally reviewed  ASSESSMENT: 1. Severe symptomatic bicuspid calcific aortic stenosis - s/p 25 mm Edwards MagnaEase tissue valve (12/2015) 2. Mild to moderate mitral regurgitation with "marked" left atrial enlargement 3. COPD - quit smoking (2017) 4. Dilated cardiomyopathy with EF 45-50% -> reduced to 20-25% perioperatively, now improved to 45-50% (08/2016) 5. Persistent postoperative atrial flutter-CHADSVASC score of 3 (on warfarin), s/p cardioversion and maintaining sinus 6. Claudication- status post left common femoral, superficial femoral, profunda femoral and external iliac endarterectomy and bovine pericardial patch angioplasty (10/2016) 7. Hypertension 8. Gas/bloating  PLAN: 1.   Mr. Tomaro continues to do well and has gone ahead and lost the weight that we discussed.  He is feeling better and denies claudication, chest pain or worsening shortness of breath.  He will be due for repeat  echo and follow-up in 6 months.  He remains in sinus bradycardia and is on Xarelto.  Blood pressure is well controlled.  He continues to have some sleep problems at night but is compliant with CPAP.  He did sleep better with Tylenol  PM and it is okay for him to use this as needed.  Follow-up with me in 6 months.  Chrystie Nose, MD, [facility], FACP  [place]  [facility]  Medical Director of the [facility] Lipid [facility] &  Cardiovascular Risk Reduction Clinic Diplomate of the American Board of Clinical Lipidology Attending Cardiologist  Direct Dial: [phone]  Fax: [phone]  Website:  [email or web address] 04/03/2018, 8:13 AM

## 2018-04-03 NOTE — Patient Instructions (Signed)
Your physician wants you to follow-up in: 6 months with Dr. Hilty (after echo). You will receive a reminder letter in the mail two months in advance. If you don't receive a letter, please call our office to schedule the follow-up appointment.  

## 2018-04-07 ENCOUNTER — Other Ambulatory Visit: Payer: Self-pay | Admitting: Cardiovascular Disease

## 2018-04-07 NOTE — Telephone Encounter (Signed)
Please review for refill. Thank you! 

## 2018-06-16 ENCOUNTER — Other Ambulatory Visit: Payer: Self-pay | Admitting: Internal Medicine

## 2018-09-10 ENCOUNTER — Ambulatory Visit (HOSPITAL_COMMUNITY)
Admission: RE | Admit: 2018-09-10 | Discharge: 2018-09-10 | Disposition: A | Discharge status: Home / Self Care | Payer: BLUE CROSS/BLUE SHIELD | Source: Ambulatory Visit | Attending: Cardiovascular Disease | Admitting: Cardiovascular Disease

## 2018-09-10 ENCOUNTER — Other Ambulatory Visit: Payer: Self-pay

## 2018-09-10 DIAGNOSIS — I739 Peripheral vascular disease, unspecified: Secondary | ICD-10-CM | POA: Insufficient documentation

## 2018-09-15 ENCOUNTER — Telehealth: Payer: Self-pay | Admitting: *Deleted

## 2018-09-15 DIAGNOSIS — I739 Peripheral vascular disease, unspecified: Secondary | ICD-10-CM

## 2018-09-15 NOTE — Telephone Encounter (Signed)
-----   Message from [name], MD sent at 09/15/2018  1:39 PM EDT ----- Inform patient that vascular studies were normal.  Repeat in 1 year.

## 2018-09-15 NOTE — Telephone Encounter (Signed)
Patient made aware of results and verbalized understanding. Repeat orders placed.    

## 2018-10-13 ENCOUNTER — Other Ambulatory Visit: Payer: Self-pay | Admitting: Internal Medicine

## 2018-10-13 MED ORDER — RIVAROXABAN 20 MG PO TABS: 20.0000 mg | ORAL_TABLET | Freq: Every day | ORAL | 1 refills | Status: DC

## 2018-10-13 NOTE — Telephone Encounter (Signed)
Atorvastatin and Xarelto refilled.

## 2018-10-29 ENCOUNTER — Ambulatory Visit: Payer: BLUE CROSS/BLUE SHIELD | Admitting: Internal Medicine

## 2018-11-09 ENCOUNTER — Other Ambulatory Visit: Payer: Self-pay | Admitting: Internal Medicine

## 2018-11-09 NOTE — Telephone Encounter (Signed)
Losartan and furosemide refilled.

## 2018-11-10 ENCOUNTER — Other Ambulatory Visit: Payer: Self-pay | Admitting: Internal Medicine

## 2018-11-10 NOTE — Telephone Encounter (Signed)
He is on losartan - I will refuse this refill, which is not available d/t shortage anyhow.  Dr. Rexene Edison

## 2019-01-06 ENCOUNTER — Other Ambulatory Visit: Payer: Self-pay | Admitting: Internal Medicine

## 2019-01-08 ENCOUNTER — Ambulatory Visit (HOSPITAL_COMMUNITY): Payer: BC Managed Care – PPO | Attending: Internal Medicine

## 2019-01-08 ENCOUNTER — Other Ambulatory Visit: Payer: Self-pay

## 2019-01-08 DIAGNOSIS — Z952 Presence of prosthetic heart valve: Secondary | ICD-10-CM | POA: Diagnosis not present

## 2019-01-08 DIAGNOSIS — I428 Other cardiomyopathies: Secondary | ICD-10-CM

## 2019-01-15 ENCOUNTER — Telehealth: Payer: Self-pay | Admitting: Internal Medicine

## 2019-01-15 NOTE — Telephone Encounter (Signed)
Patient had Echocardiogram last Friday 01/08/19 and has not gotten the results.

## 2019-01-15 NOTE — Telephone Encounter (Signed)
Called patient back, informed him of echo results. No further questions.

## 2019-01-18 ENCOUNTER — Ambulatory Visit: Payer: BC Managed Care – PPO | Admitting: Internal Medicine

## 2019-03-24 NOTE — Telephone Encounter (Signed)
Spoke with patient about 04/02/2019 visit. He would like to proceed with telemedicine visit. I reviewed consent with him. He will do a virtual video visit on his iphone. He will check VS (BP, pulse, weight) between now and appt date.

## 2019-03-30 ENCOUNTER — Other Ambulatory Visit: Payer: Self-pay | Admitting: Internal Medicine

## 2019-04-02 ENCOUNTER — Telehealth (INDEPENDENT_AMBULATORY_CARE_PROVIDER_SITE_OTHER): Payer: BC Managed Care – PPO | Admitting: Internal Medicine

## 2019-04-02 ENCOUNTER — Encounter: Payer: Self-pay | Admitting: Internal Medicine

## 2019-04-02 VITALS — BP 129/77 | HR 66 | Ht 70.0 in | Wt 191.0 lb

## 2019-04-02 DIAGNOSIS — I5022 Chronic systolic (congestive) heart failure: Secondary | ICD-10-CM

## 2019-04-02 DIAGNOSIS — I10 Essential (primary) hypertension: Secondary | ICD-10-CM

## 2019-04-02 DIAGNOSIS — I2510 Atherosclerotic heart disease of native coronary artery without angina pectoris: Secondary | ICD-10-CM | POA: Diagnosis not present

## 2019-04-02 DIAGNOSIS — E785 Hyperlipidemia, unspecified: Secondary | ICD-10-CM

## 2019-04-02 DIAGNOSIS — Z952 Presence of prosthetic heart valve: Secondary | ICD-10-CM

## 2019-04-02 DIAGNOSIS — I480 Paroxysmal atrial fibrillation: Secondary | ICD-10-CM

## 2019-04-02 DIAGNOSIS — I739 Peripheral vascular disease, unspecified: Secondary | ICD-10-CM

## 2019-04-02 NOTE — Patient Instructions (Signed)
Medication Instructions:  Take Flomax at night Continue other current medications  If you need a refill on your cardiac medications before your next appointment, please call your pharmacy.   Follow-Up: At [facility], you and your health needs are our priority.  As part of our continuing mission to provide you with exceptional heart care, we have created designated Provider Care Teams.  These Care Teams include your primary Cardiologist (physician) and Advanced Practice Providers (APPs -  Physician Assistants and Nurse Practitioners) who all work together to provide you with the care you need, when you need it. You will need a follow up appointment in 12 months.  Please call our office 2 months in advance to schedule this appointment.  You may see Pixie Casino, MD or one of the following Advanced Practice Providers on your designated Care Team: [place], [place] . Fabian Sharp, PA-C  Any Other Special Instructions Will Be Listed Below (If Applicable).

## 2019-04-02 NOTE — Progress Notes (Signed)
Virtual Visit via Video Note   This visit type was conducted due to national recommendations for restrictions regarding the COVID-19 Pandemic (e.g. social distancing) in an effort to limit this patient's exposure and mitigate transmission in our community.  Due to his co-morbid illnesses, this patient is at least at moderate risk for complications without adequate follow up.  This format is felt to be most appropriate for this patient at this time.  All issues noted in this document were discussed and addressed.  A limited physical exam was performed with this format.  Please refer to the patient's chart for his consent to telehealth for [facility].   Evaluation Performed:  Video follow-up  Date:  04/02/2019   ID:  [name], DOB [birth year], MRN [id number]  Patient Location:  [place] [address]  Provider location:   [address], [place], [place] [address]  PCP:  Enid Skeens., MD  Cardiologist:  Pixie Casino, MD Electrophysiologist:  None   Chief Complaint: No complaints  History of Present Illness:    [name] is a 63 y.o. male who presents via audio/video conferencing for a telehealth visit today.  [name] is a pleasant 63 year old male I followed for history of critical aortic stenosis and peripheral arterial disease as well as COPD and mild nonobstructive coronary disease.  He underwent bioprosthetic AVR and was complicated by some atrial fibrillation/flutter.  Fortunately he has not had significant recurrence of that.  At that time his LVEF was depressed to 45% however a recent echo was repeated that shows normal LV function.  His gradients are also preserved across the aortic valve.  He continues to lose weight is made significant dietary changes.  He is on Lasix 40 mg daily.  His echo did show grade 2 diastolic dysfunction although he has had LV recovery, I am hesitant to take him off the Lasix completely.  He reports that  he will get lab work done in the near future with his primary care provider.  The patient does not have symptoms concerning for COVID-19 infection (fever, chills, cough, or new SHORTNESS OF BREATH).    Prior CV studies:   The following studies were reviewed today:  Chart reviewed Labwork  PMHx:  Past Medical History:  Diagnosis Date   Asthma    CAD (coronary artery disease) 01/23/2016   minor CAD at cath-40% RCA   CHF (congestive heart failure) (HCC)    aortic valve replacement   COPD (chronic obstructive pulmonary disease) ([place])    denies patient said that a pulmonologist said he said that he didnt have COPD   Dyspnea    Dysrhythmia    hx afib   Heart murmur    had a murmur before the aortic valve replacement, no murmur according to dr. Debara Pickett per patient   Hypertension    Non-ischemic cardiomyopathy ([place]) 01/23/2016   EF 30-35%   Pneumonia    Severe aortic stenosis    SOBOE (shortness of breath on exertion)     Past Surgical History:  Procedure Laterality Date   ABDOMINAL AORTOGRAM W/LOWER EXTREMITY N/A 10/16/2016   Procedure: Abdominal Aortogram w/Lower Extremity;  Surgeon: Wellington Hampshire, MD;  Location: [place] CV LAB;  Service: Cardiovascular;  Laterality: N/A;   AORTIC VALVE REPLACEMENT N/A 01/29/2016   Procedure: AORTIC VALVE REPLACEMENT (AVR) with Magna Ease size 43mm;  Surgeon: Ivin Poot, MD;  Location: [place];  Service: Open Heart Surgery;  Laterality: N/A;   CARDIAC CATHETERIZATION N/A 01/23/2016   Procedure: Right/Left Heart Cath and Coronary Angiography;  Surgeon: Chrystie Nose, MD;  Location: [facility] INVASIVE CV LAB;  Service: Cardiovascular;  Laterality: N/A;   CARDIOVERSION N/A 03/26/2016   Procedure: CARDIOVERSION;  Surgeon: Chrystie Nose, MD;  Location: [facility] ENDOSCOPY;  Service: Cardiovascular;  Laterality: N/A;   CATARACT EXTRACTION  12/2002 & 09/2007   ENDARTERECTOMY FEMORAL Left 11/01/2016   Procedure: ENDARTERECTOMY FEMORAL;   Surgeon: [name], MD;  Location: [facility] OR;  Service: Vascular;  Laterality: Left;   PATCH ANGIOPLASTY Left 11/01/2016   Procedure: PATCH ANGIOPLASTY;  Surgeon: [name], MD;  Location: [facility] OR;  Service: Vascular;  Laterality: Left;   SHOULDER SURGERY Right 1971   TEE WITHOUT CARDIOVERSION N/A 01/29/2016   Procedure: TRANSESOPHAGEAL ECHOCARDIOGRAM (TEE);  Surgeon: [name], MD;  Location: [facility] OR;  Service: Open Heart Surgery;  Laterality: N/A;    FAMHx:  Family History  Problem Relation Age of Onset   Breast cancer Mother    Breast cancer Sister     SOCHx:   reports that he quit smoking about 3 years ago. His smoking use included cigarettes. He has a 45.00 pack-year smoking history. He has quit using smokeless tobacco. He reports current alcohol use of about 12.0 standard drinks of alcohol per week. He reports that he does not use drugs.  ALLERGIES:  Allergies  Allergen Reactions   No Known Allergies     MEDS:  Current Meds  Medication Sig   atorvastatin (LIPITOR) 10 MG tablet TAKE 1 TABLET BY MOUTH EVERY DAY   carvedilol (COREG) 3.125 MG tablet TAKE 1 TABLET (3.125 MG TOTAL) BY MOUTH 2 (TWO) TIMES DAILY WITH A MEAL.   finasteride (PROSCAR) 5 MG tablet Take 1 tablet (5 mg total) by mouth daily.   furosemide (LASIX) 40 MG tablet TAKE 1 TABLET BY MOUTH EVERY DAY   losartan (COZAAR) 100 MG tablet TAKE 1 TABLET BY MOUTH EVERY DAY   rivaroxaban (XARELTO) 20 MG TABS tablet Take 1 tablet (20 mg total) by mouth daily with supper.   tamsulosin (FLOMAX) 0.4 MG CAPS capsule Take 0.4 mg by mouth daily.     ROS: Pertinent items noted in HPI and remainder of comprehensive ROS otherwise negative.  Labs/Other Tests and Data Reviewed:    Recent Labs: No results found for requested labs within last 8760 hours.   Recent Lipid Panel Lab Results  Component Value Date/Time   CHOL 138 01/10/2017 09:41 AM   TRIG 63 01/10/2017 09:41 AM   HDL 68 01/10/2017 09:41 AM    CHOLHDL 2.0 01/10/2017 09:41 AM   CHOLHDL 2.4 01/19/2016 10:29 AM   LDLCALC 57 01/10/2017 09:41 AM    Wt Readings from Last 3 Encounters:  04/02/19 191 lb (86.6 kg)  04/03/18 202 lb 3.2 oz (91.7 kg)  09/30/17 211 lb 6.4 oz (95.9 kg)     Exam:    Vital Signs:  BP 129/77    Pulse 66    Ht 5\' 10"  (1.778 m)    Wt 191 lb (86.6 kg)    SpO2 97%    BMI 27.41 kg/m    General appearance: alert, no distress and Healthy appearing weight Lungs: No audible respiratory difficulty Abdomen: Normal weight Extremities: extremities normal, atraumatic, no cyanosis or edema Neurologic: Mental status: Alert, oriented, thought content appropriate Psych: Pleasant  ASSESSMENT & PLAN:    1.  Severe symptomatic bicuspid calcific aortic stenosis - s/p 25  mm Edwards MagnaEase tissue valve (12/2015) 2. Mild to moderate mitral regurgitation with "marked" left atrial enlargement 3. COPD - quit smoking (2017) 4. Dilated cardiomyopathy with EF 45-50% -> reduced to 20-25% perioperatively, now improved to 45-50% (08/2016) 5. Persistent postoperative atrial flutter-CHADSVASC score of 3 (on warfarin), s/p cardioversion and maintaining sinus 6. Claudication- status post left common femoral, superficial femoral, profunda femoral and external iliac endarterectomy and bovine pericardial patch angioplasty (10/2016) 7. Hypertension  Mr. Fontaine is doing very well after replacement of his aortic valve in 2017.  He denies any symptoms of claudication after endarterectomy.  He has not had any recurrent atrial fib or flutter.  He quit smoking in 2017 and did have some COPD.  Echo shows improvement of LVEF now up to 5 to 60% with grade 2 diastolic dysfunction.  Overall he is doing well.  At times he is occasionally will see an elevated blood pressure in the morning before taking his medicines up to about 140 systolic, but generally he runs between 120 and 130 systolic.  I advised he may want to consider taking tamsulosin at night.   This should give him a little better benefit with regards to the prostate as well as a lower blood pressure in the morning.  COVID-19 Education: The signs and symptoms of COVID-19 were discussed with the patient and how to seek care for testing (follow up with PCP or arrange E-visit).  The importance of social distancing was discussed today.  Patient Risk:   After full review of this patients clinical status, I feel that they are at least moderate risk at this time.  Time:   Today, I have spent 25 minutes with the patient with telehealth technology discussing afib, AVR, PAD.     Medication Adjustments/Labs and Tests Ordered: Current medicines are reviewed at length with the patient today.  Concerns regarding medicines are outlined above.   Tests Ordered: No orders of the defined types were placed in this encounter.   Medication Changes: No orders of the defined types were placed in this encounter.   Disposition:  in 1 year(s)  Chrystie Nose, MD, [facility], FACP  [place]   [facility]  Medical Director of the Advanced Lipid Disorders &  Cardiovascular Risk Reduction Clinic Diplomate of the American Board of Clinical Lipidology Attending Cardiologist  Direct Dial: [phone]   Fax: [phone]  Website:  [email or web address]  Chrystie Nose, MD  04/02/2019 8:02 AM

## 2019-04-03 ENCOUNTER — Other Ambulatory Visit: Payer: Self-pay | Admitting: Internal Medicine

## 2019-04-11 ENCOUNTER — Other Ambulatory Visit: Payer: Self-pay | Admitting: Cardiovascular Disease

## 2019-04-12 NOTE — Telephone Encounter (Signed)
Refill Request.  

## 2019-04-12 NOTE — Telephone Encounter (Signed)
PT STATED THAT they are going to pcp on this upcoming Friday and that he will have a metabolic panel drawn there will await results

## 2019-04-12 NOTE — Telephone Encounter (Signed)
Pt's age 63, wt 86.6 kg, last ov w Dr. Debara Pickett 04/02/19.  Pt overdue for labs.  Will route to [facility] to see if they can get him an appt for labs.

## 2019-04-19 NOTE — Telephone Encounter (Signed)
Patient waiting for Lab results. He has xarelto at home.

## 2019-05-09 ENCOUNTER — Other Ambulatory Visit: Payer: Self-pay | Admitting: Internal Medicine

## 2019-05-18 ENCOUNTER — Other Ambulatory Visit: Payer: Self-pay | Admitting: Internal Medicine

## 2019-05-23 ENCOUNTER — Other Ambulatory Visit: Payer: Self-pay | Admitting: Internal Medicine

## 2019-06-19 ENCOUNTER — Other Ambulatory Visit: Payer: Self-pay | Admitting: Internal Medicine

## 2019-07-16 ENCOUNTER — Other Ambulatory Visit: Payer: Self-pay | Admitting: Internal Medicine

## 2019-09-13 ENCOUNTER — Ambulatory Visit (HOSPITAL_COMMUNITY): Payer: BC Managed Care – PPO

## 2019-10-15 DIAGNOSIS — E7801 Familial hypercholesterolemia: Secondary | ICD-10-CM | POA: Diagnosis not present

## 2019-10-15 DIAGNOSIS — I10 Essential (primary) hypertension: Secondary | ICD-10-CM | POA: Diagnosis not present

## 2019-10-15 DIAGNOSIS — Z79899 Other long term (current) drug therapy: Secondary | ICD-10-CM | POA: Diagnosis not present

## 2020-01-10 ENCOUNTER — Other Ambulatory Visit: Payer: Self-pay | Admitting: Internal Medicine

## 2020-01-15 ENCOUNTER — Other Ambulatory Visit: Payer: Self-pay | Admitting: Internal Medicine

## 2020-01-24 ENCOUNTER — Other Ambulatory Visit: Payer: Self-pay

## 2020-01-24 MED ORDER — LOSARTAN POTASSIUM 100 MG PO TABS: 100.0000 mg | ORAL_TABLET | Freq: Every day | ORAL | 2 refills | Status: DC

## 2020-03-23 ENCOUNTER — Other Ambulatory Visit: Payer: Self-pay | Admitting: Internal Medicine

## 2020-04-14 ENCOUNTER — Other Ambulatory Visit: Payer: Self-pay | Admitting: Internal Medicine

## 2020-04-16 ENCOUNTER — Other Ambulatory Visit: Payer: Self-pay | Admitting: Internal Medicine

## 2020-04-18 ENCOUNTER — Other Ambulatory Visit: Payer: Self-pay | Admitting: Internal Medicine

## 2020-04-18 NOTE — Telephone Encounter (Signed)
Rx has been sent to the pharmacy electronically. ° °

## 2020-04-23 NOTE — Progress Notes (Signed)
Virtual Visit via Telephone Note   This visit type was conducted due to national recommendations for restrictions regarding the COVID-19 Pandemic (e.g. social distancing) in an effort to limit this patient's exposure and mitigate transmission in our community.  Due to his co-morbid illnesses, this patient is at least at moderate risk for complications without adequate follow up.  This format is felt to be most appropriate for this patient at this time.  The patient did not have access to video technology/had technical difficulties with video requiring transitioning to audio format only (telephone).  All issues noted in this document were discussed and addressed.  No physical exam could be performed with this format.  Please refer to the patient's chart for his  consent to telehealth for [facility].    Date:  04/25/2020   ID:  [name], DOB 1956/06/02, MRN [id number] The patient was identified using 2 identifiers.  Patient Location: Home Provider Location: Office/Clinic  PCP:  Nonnie Done., MD  Cardiologist:  Chrystie Nose, MD  Electrophysiologist:  None   Evaluation Performed:  Follow-Up Visit  Chief Complaint:  Routine follow-up.  History of Present Illness:    [name] is a 64 y.o. male with chronic combined CHF/NICM, mild non-obstructive CAD, severe aortic stenosis s/p bioprosthetic AVR, and post-op atrial flutter, and PAD s/p left common femoral, superficial femoral, profunda femoral, and external iliac endarterectomy and bovine pericardial patch angioplasty in 2018, HTN, HLD, COPD, and former tobacco abuse (quit in 2017), who presents today for routine annual follow-up.   He was last evaluated by cardiology at an outpatient visit with Dr. Rennis Golden 04/2019, at which time he was without cardiac complaints. He was hesitant to discontinue his lasix with evidence of G2DD on last echo, therefore no medication changes occurred and he was recommended to follow-up in 1  year. His last echocardiogram 12/2018 showed EF 55-60%, G2DD, mild LVH, no RWMA, mild AC, normal aortic valve prosthesis, and mild dilation of the ascending aorta (89mm).  He has done well from a cardiac standpoint over the past year. He has been working hard on dietary and lifestyle modifications and has lost ~30lbs. He continues to stay active with yard work and is hoping to retire next year. He denies chest pain, SOB, DOE, palpitations, dizziness, lightheadedness, syncope, LE edema, orthopnea, or PND.   The patient does not have symptoms concerning for COVID-19 infection (fever, chills, cough, or new shortness of breath). He is anticipating getting his COVID-19 booster next month. He is up to date on his flu vaccine as well.    Past Medical History:  Diagnosis Date  . Asthma   . CAD (coronary artery disease) 01/23/2016   minor CAD at cath-40% RCA  . CHF (congestive heart failure) (HCC)    aortic valve replacement  . COPD (chronic obstructive pulmonary disease) (HCC)    denies patient said that a pulmonologist said he said that he didnt have COPD  . Dyspnea   . Dysrhythmia    hx afib  . Heart murmur    had a murmur before the aortic valve replacement, no murmur according to dr. Rennis Golden per patient  . Hypertension   . Non-ischemic cardiomyopathy (HCC) 01/23/2016   EF 30-35%  . Pneumonia   . Severe aortic stenosis   . SOBOE (shortness of breath on exertion)    Past Surgical History:  Procedure Laterality Date  . ABDOMINAL AORTOGRAM W/LOWER EXTREMITY N/A 10/16/2016   Procedure: Abdominal Aortogram w/Lower Extremity;  Surgeon: [name], MD;  Location: [facility] INVASIVE CV LAB;  Service: Cardiovascular;  Laterality: N/A;  . AORTIC VALVE REPLACEMENT N/A 01/29/2016   Procedure: AORTIC VALVE REPLACEMENT (AVR) with Magna Ease size 53mm;  Surgeon: [name], MD;  Location: [facility] OR;  Service: Open Heart Surgery;  Laterality: N/A;  . CARDIAC CATHETERIZATION N/A 01/23/2016   Procedure:  Right/Left Heart Cath and Coronary Angiography;  Surgeon: Chrystie Nose, MD;  Location: [facility] INVASIVE CV LAB;  Service: Cardiovascular;  Laterality: N/A;  . CARDIOVERSION N/A 03/26/2016   Procedure: CARDIOVERSION;  Surgeon: Chrystie Nose, MD;  Location: [facility] ENDOSCOPY;  Service: Cardiovascular;  Laterality: N/A;  . CATARACT EXTRACTION  12/2002 & 09/2007  . ENDARTERECTOMY FEMORAL Left 11/01/2016   Procedure: ENDARTERECTOMY FEMORAL;  Surgeon: [name], MD;  Location: [facility] OR;  Service: Vascular;  Laterality: Left;  . PATCH ANGIOPLASTY Left 11/01/2016   Procedure: PATCH ANGIOPLASTY;  Surgeon: [name], MD;  Location: [facility] OR;  Service: Vascular;  Laterality: Left;  . SHOULDER SURGERY Right 1971  . TEE WITHOUT CARDIOVERSION N/A 01/29/2016   Procedure: TRANSESOPHAGEAL ECHOCARDIOGRAM (TEE);  Surgeon: [name], MD;  Location: [facility] OR;  Service: Open Heart Surgery;  Laterality: N/A;     Current Meds  Medication Sig  . atorvastatin (LIPITOR) 10 MG tablet TAKE 1 TABLET BY MOUTH EVERY DAY  . carvedilol (COREG) 3.125 MG tablet TAKE 1 TABLET (3.125 MG TOTAL) BY MOUTH 2 (TWO) TIMES DAILY WITH A MEAL.  . finasteride (PROSCAR) 5 MG tablet Take 1 tablet (5 mg total) by mouth daily.  . furosemide (LASIX) 40 MG tablet TAKE 1 TABLET BY MOUTH EVERY DAY  . losartan (COZAAR) 100 MG tablet Take 1 tablet by mouth once daily  . tamsulosin (FLOMAX) 0.4 MG CAPS capsule Take 0.4 mg by mouth daily.  [name] 20 MG TABS tablet TAKE 1 TABLET (20 MG TOTAL) BY MOUTH DAILY WITH SUPPER.     Allergies:   No known allergies   Social History   Tobacco Use  . Smoking status: Former Smoker    Packs/day: 1.00    Years: 45.00    Pack years: 45.00    Types: Cigarettes    Quit date: 01/23/2016    Years since quitting: 4.2  . Smokeless tobacco: Former Clinical biochemist  . Vaping Use: Former  . Quit date: 08/28/2015  Substance Use Topics  . Alcohol use: Yes    Alcohol/week: 12.0 standard drinks    Types: 12  Standard drinks or equivalent per week    Comment: beer  . Drug use: No     Family Hx: The patient's family history includes Breast cancer in his mother and sister.  ROS:   Please see the history of present illness.     All other systems reviewed and are negative.   Prior CV studies:   The following studies were reviewed today:  Echocardiogram 12/2018: 1. The left ventricle has normal systolic function, with an ejection  fraction of 55-60%. The cavity size was normal. There is mildly increased  left ventricular wall thickness. Left ventricular diastolic Doppler  parameters are consistent with  pseudonormalization.  2. The right ventricle has normal systolic function. The cavity was  normal.  3. There is mild mitral annular calcification present.  4. A 14mm Edwards Magna Ease valve is present in the aortic position.  Procedure Date: 12/31/2015 Normal aortic valve prosthesis.  5. There is mild dilatation of the ascending aorta measuring  38 mm.  6. Normal LV systolic function; mild LVH; moderate diastolic dysfunction;  s/p AVR with mean gradient of 21 mmHg and trace AI.   Right/left heart catheterization 2017L  Mid RCA lesion, 40 %stenosed.  Prox RCA lesion, 10 %stenosed.  Hemodynamic findings consistent with moderate pulmonary hypertension, mitral valve regurgitation and aortic valve stenosis.   Non-obstructive CAD. Mildly reduced cardiac index. PA 58%, PCWP 28,  Mean PA 45. BNP was 1100 and CXR shows pulmonary edema. Will admit for diuresis and consult CT surgery to evaluate for AVR. Repeat echocardiogram.   Labs/Other Tests and Data Reviewed:    EKG:  No ECG reviewed.  Recent Labs: No results found for requested labs within last 8760 hours.   Recent Lipid Panel Lab Results  Component Value Date/Time   CHOL 138 01/10/2017 09:41 AM   TRIG 63 01/10/2017 09:41 AM   HDL 68 01/10/2017 09:41 AM   CHOLHDL 2.0 01/10/2017 09:41 AM   CHOLHDL 2.4 01/19/2016 10:29 AM    LDLCALC 57 01/10/2017 09:41 AM    Wt Readings from Last 3 Encounters:  04/25/20 164 lb 9.6 oz (74.7 kg)  04/02/19 191 lb (86.6 kg)  04/03/18 202 lb 3.2 oz (91.7 kg)     Risk Assessment/Calculations:     CHA2DS2-VASc Score = 4  This indicates a 4.8% annual risk of stroke. The patient's score is based upon: CHF History: 1 HTN History: 1 Diabetes History: 0 Stroke History: 0 Vascular Disease History: 1 Age Score: 1 Gender Score: 0     Objective:    Vital Signs:  BP 119/68   Pulse 60   Temp (!) 96.9 F (36.1 C)   Ht 5\' 10"  (1.778 m)   Wt 164 lb 9.6 oz (74.7 kg)   BMI 23.62 kg/m    VITAL SIGNS:  reviewed GEN:  no acute distress RESPIRATORY:  speaking in full sentences without SOB CARDIOVASCULAR:  no peripheral edema NEURO:  A&O x3 PSYCH:  normal affect  ASSESSMENT & PLAN:    1. Chronic combined CHF/NICM: EF dropped to 20-25% perioperatively in the setting of critical AS, now improved to 55-60% on echo in 12/2018. No volume overload complaints - Continue carvedilol, losartan, and lasix  2. Severe aortic stenosis s/p bioprosthetic AVR in 2017: valve appeared stable on last echo 12/2018. No new symptoms to suggest clinical change.  - Continue routine surveillance monitoring  3. Post-op atrial flutter s/p DCCV 2017: no complaints of palpitations to suggest recurrence.  - Continue xarelto for stroke ppx - Continue carvedilol for rate control  4. Non-obstructive CAD: mild disease noted on LHC prior to AVR in 2017. No anginal complaints. Not on aspirin given need for anticoagulation. - Continue aggressive risk factor modifications - goal BP <130/80, LDL<70, A1C <7.   5. PAD: no complaints of claudication - Continue aggressive risk factor modifications - goal BP <130/80, LDL<70, A1C <7.   5. HTN: BP 119/68 today - Continue carvedilol, losartan, and lasix  6. HLD: No recent lipids on file. He will have PCP office send his lipid panel after his visit next month -  Continue atorvastatin   COVID-19 Education: The signs and symptoms of COVID-19 were discussed with the patient and how to seek care for testing (follow up with PCP or arrange E-visit).  The importance of social distancing was discussed today.  Time:   Today, I have spent 7 minutes with the patient with telehealth technology discussing the above problems.     Medication Adjustments/Labs and Tests  Ordered: Current medicines are reviewed at length with the patient today.  Concerns regarding medicines are outlined above.   Tests Ordered: No orders of the defined types were placed in this encounter.   Medication Changes: No orders of the defined types were placed in this encounter.   Follow Up:  In Person in 1 year(s)  Signed, Beatriz Stallion, PA-C  04/25/2020 9:25 AM    [facility]

## 2020-04-25 ENCOUNTER — Telehealth (INDEPENDENT_AMBULATORY_CARE_PROVIDER_SITE_OTHER): Payer: Self-pay | Admitting: Medical

## 2020-04-25 ENCOUNTER — Encounter: Payer: Self-pay | Admitting: Medical

## 2020-04-25 VITALS — BP 119/68 | HR 60 | Temp 96.9°F | Ht 70.0 in | Wt 164.6 lb

## 2020-04-25 DIAGNOSIS — E785 Hyperlipidemia, unspecified: Secondary | ICD-10-CM

## 2020-04-25 DIAGNOSIS — I739 Peripheral vascular disease, unspecified: Secondary | ICD-10-CM

## 2020-04-25 DIAGNOSIS — I10 Essential (primary) hypertension: Secondary | ICD-10-CM

## 2020-04-25 DIAGNOSIS — I5042 Chronic combined systolic (congestive) and diastolic (congestive) heart failure: Secondary | ICD-10-CM

## 2020-04-25 DIAGNOSIS — I2510 Atherosclerotic heart disease of native coronary artery without angina pectoris: Secondary | ICD-10-CM

## 2020-04-25 DIAGNOSIS — Z952 Presence of prosthetic heart valve: Secondary | ICD-10-CM

## 2020-04-25 DIAGNOSIS — I4892 Unspecified atrial flutter: Secondary | ICD-10-CM

## 2020-04-25 NOTE — Patient Instructions (Signed)
Medication Instructions:  °Continue current medications ° °*If you need a refill on your cardiac medications before your next appointment, please call your pharmacy* ° ° °Lab Work: °None Ordered ° ° °Testing/Procedures: °None Ordered ° ° °Follow-Up: °At [facility], you and your health needs are our priority.  As part of our continuing mission to provide you with exceptional heart care, we have created designated Provider Care Teams.  These Care Teams include your primary Cardiologist (physician) and Advanced Practice Providers (APPs -  Physician Assistants and Nurse Practitioners) who all work together to provide you with the care you need, when you need it. ° °We recommend signing up for the patient portal called "MyChart".  Sign up information is provided on this After Visit Summary.  MyChart is used to connect with patients for Virtual Visits (Telemedicine).  Patients are able to view lab/test results, encounter notes, upcoming appointments, etc.  Non-urgent messages can be sent to your provider as well.   °To learn more about what you can do with MyChart, go to [email or web address].   ° °Your next appointment:   °1 year(s) ° °The format for your next appointment:   °In Person ° °Provider:   °You may see Kenneth C Hilty, MD or one of the following Advanced Practice Providers on your designated Care Team:   °· Hao Meng, PA-C °· Angela Duke, PA-C or  °· Krista Kroeger, PA-C ° ° ° °

## 2020-04-25 NOTE — Addendum Note (Signed)
Addended by: Judy Pimple on: 04/25/2020 10:45 AM   Modules accepted: Level of Service

## 2020-04-28 DIAGNOSIS — Z23 Encounter for immunization: Secondary | ICD-10-CM | POA: Diagnosis not present

## 2020-04-28 DIAGNOSIS — E7801 Familial hypercholesterolemia: Secondary | ICD-10-CM | POA: Diagnosis not present

## 2020-04-28 DIAGNOSIS — Z79899 Other long term (current) drug therapy: Secondary | ICD-10-CM | POA: Diagnosis not present

## 2020-04-28 DIAGNOSIS — I10 Essential (primary) hypertension: Secondary | ICD-10-CM | POA: Diagnosis not present

## 2020-05-05 ENCOUNTER — Other Ambulatory Visit: Payer: Self-pay | Admitting: Internal Medicine

## 2020-05-13 ENCOUNTER — Other Ambulatory Visit: Payer: Self-pay | Admitting: Internal Medicine

## 2020-05-15 NOTE — Telephone Encounter (Signed)
Rx has been sent to the pharmacy electronically. ° °

## 2020-05-21 ENCOUNTER — Other Ambulatory Visit: Payer: Self-pay | Admitting: Internal Medicine

## 2020-06-01 ENCOUNTER — Other Ambulatory Visit: Payer: Self-pay | Admitting: Internal Medicine

## 2020-07-13 ENCOUNTER — Other Ambulatory Visit: Payer: Self-pay | Admitting: Internal Medicine

## 2020-08-22 ENCOUNTER — Other Ambulatory Visit: Payer: Self-pay | Admitting: Internal Medicine

## 2020-10-01 ENCOUNTER — Other Ambulatory Visit: Payer: Self-pay | Admitting: Internal Medicine

## 2020-10-06 ENCOUNTER — Other Ambulatory Visit: Payer: Self-pay | Admitting: Internal Medicine

## 2021-03-31 ENCOUNTER — Other Ambulatory Visit: Payer: Self-pay | Admitting: Internal Medicine

## 2021-04-24 ENCOUNTER — Other Ambulatory Visit: Payer: Self-pay | Admitting: Internal Medicine

## 2021-05-08 ENCOUNTER — Other Ambulatory Visit: Payer: Self-pay | Admitting: Internal Medicine

## 2021-05-23 ENCOUNTER — Other Ambulatory Visit: Payer: Self-pay | Admitting: Internal Medicine

## 2021-05-29 ENCOUNTER — Other Ambulatory Visit: Payer: Self-pay | Admitting: Internal Medicine

## 2021-05-30 ENCOUNTER — Other Ambulatory Visit: Payer: Self-pay

## 2021-05-30 MED ORDER — CARVEDILOL 3.125 MG PO TABS: 3.1250 mg | ORAL_TABLET | Freq: Two times a day (BID) | ORAL | 1 refills | Status: DC

## 2021-06-23 ENCOUNTER — Other Ambulatory Visit: Payer: Self-pay | Admitting: Internal Medicine

## 2021-06-27 ENCOUNTER — Other Ambulatory Visit: Payer: Self-pay | Admitting: Internal Medicine

## 2021-06-27 NOTE — Telephone Encounter (Signed)
Prescription refill request for Xarelto received.  Indication: Last office visit: 04/25/20 [name]) - Pt has appt on 07/25/21 with [name].  Weight: 74.7kg Age: 65 Scr: 0.54 (04/28/20)  CrCl: 140.49ml/min  Pt labs and OV overdue. Pt has scheduled cardiology appt on 07/25/21. Labs should be drawn at this appt. Refill sent to provide pt with enough medication until this appt. Called pt and made aware.

## 2021-06-29 ENCOUNTER — Other Ambulatory Visit: Payer: Self-pay | Admitting: Internal Medicine

## 2021-06-29 MED ORDER — CARVEDILOL 3.125 MG PO TABS: 3.1250 mg | ORAL_TABLET | Freq: Two times a day (BID) | ORAL | 1 refills | Status: DC

## 2021-07-06 DIAGNOSIS — E7801 Familial hypercholesterolemia: Secondary | ICD-10-CM | POA: Diagnosis not present

## 2021-07-06 DIAGNOSIS — I10 Essential (primary) hypertension: Secondary | ICD-10-CM | POA: Diagnosis not present

## 2021-07-06 DIAGNOSIS — Z79899 Other long term (current) drug therapy: Secondary | ICD-10-CM | POA: Diagnosis not present

## 2021-07-22 ENCOUNTER — Other Ambulatory Visit: Payer: Self-pay | Admitting: Internal Medicine

## 2021-07-25 ENCOUNTER — Ambulatory Visit: Payer: BC Managed Care – PPO | Admitting: Physician Assistant

## 2021-07-25 ENCOUNTER — Other Ambulatory Visit: Payer: Self-pay

## 2021-07-25 ENCOUNTER — Encounter: Payer: Self-pay | Admitting: Physician Assistant

## 2021-07-25 VITALS — BP 166/82 | HR 55 | Ht 70.0 in | Wt 177.8 lb

## 2021-07-25 DIAGNOSIS — Z952 Presence of prosthetic heart valve: Secondary | ICD-10-CM

## 2021-07-25 DIAGNOSIS — I739 Peripheral vascular disease, unspecified: Secondary | ICD-10-CM

## 2021-07-25 DIAGNOSIS — J449 Chronic obstructive pulmonary disease, unspecified: Secondary | ICD-10-CM

## 2021-07-25 DIAGNOSIS — I10 Essential (primary) hypertension: Secondary | ICD-10-CM

## 2021-07-25 DIAGNOSIS — I480 Paroxysmal atrial fibrillation: Secondary | ICD-10-CM | POA: Diagnosis not present

## 2021-07-25 DIAGNOSIS — E785 Hyperlipidemia, unspecified: Secondary | ICD-10-CM

## 2021-07-25 DIAGNOSIS — I2510 Atherosclerotic heart disease of native coronary artery without angina pectoris: Secondary | ICD-10-CM | POA: Diagnosis not present

## 2021-07-25 NOTE — Patient Instructions (Addendum)
Medication Instructions:  Your physician recommends that you continue on your current medications as directed. Please refer to the Current Medication list given to you today.   *If you need a refill on your cardiac medications before your next appointment, please call your pharmacy*   Lab Work: NONE ordered at this time of appointment   If you have labs (blood work) drawn today and your tests are completely normal, you will receive your results only by: MyChart Message (if you have MyChart) OR A paper copy in the mail If you have any lab test that is abnormal or we need to change your treatment, we will call you to review the results.   Testing/Procedures: Your physician has requested that you have an echocardiogram. Echocardiography is a painless test that uses sound waves to create images of your heart. It provides your doctor with information about the size and shape of your heart and how well your hearts chambers and valves are working. This procedure takes approximately one hour. There are no restrictions for this procedure.   Schedule for 3 months   Follow-Up: At [facility], you and your health needs are our priority.  As part of our continuing mission to provide you with exceptional heart care, we have created designated Provider Care Teams.  These Care Teams include your primary Cardiologist (physician) and Advanced Practice Providers (APPs -  Physician Assistants and Nurse Practitioners) who all work together to provide you with the care you need, when you need it.  We recommend signing up for the patient portal called "MyChart".  Sign up information is provided on this After Visit Summary.  MyChart is used to connect with patients for Virtual Visits (Telemedicine).  Patients are able to view lab/test results, encounter notes, upcoming appointments, etc.  Non-urgent messages can be sent to your provider as well.   To learn more about what you can do with MyChart, go to  [email or web address].    Your next appointment:   1 year(s)  The format for your next appointment:   In Person  Provider:   Chrystie Nose, MD     Other Instructions Please call if systolic bloop pressure is greater than 140 mmHg(top number)

## 2021-07-25 NOTE — Progress Notes (Signed)
Cardiology Office Note:    Date:  07/27/2021   ID:  [name], DOB February 10, 1956, MRN [id number]  PCP:  Enid Skeens., MD   [place] Providers Cardiologist:  Pixie Casino, MD     Referring MD: Enid Skeens., MD   Chief Complaint  Patient presents with   Follow-up    Seen for Dr. Debara Pickett    History of Present Illness:    [name] is a 66 y.o. male with a hx of combined CHF, nonischemic cardiomyopathy, mild nonobstructive CAD, severe aortic stenosis s/p bioprosthetic AVR, postop atrial flutter and PAD s/p left, femoral, superficial femoral, profunda femoral and external iliac endarterectomy and bovine pericardial patch angioplasty in 2018, hypertension, hyperlipidemia, COPD and a former tobacco abuse quit in 2017.  Echocardiogram in July 2020 showed EF 55 to 60%, grade 2 DD, mild LVH, no regional wall motion abnormality, normal aortic valve prosthesis.  Patient was last seen virtually in October 2021 at which time he was doing well.  Patient presents today for follow-up.  He denies any significant chest discomfort, worsening shortness of breath or any claudication symptoms.  He has good functional ability without limitation.  He denies any leg pain or calf pain with walking.  On physical exam, heart rate is regular, lungs clear.  He has no orthopnea or PND symptoms.  I recommend a repeat echocardiogram this year.  I will reach out to Dr. Fletcher Anon to see if he would like to have a lower extremity Doppler as well given history of PAD.  Otherwise, he can follow-up in 1 year.  Past Medical History:  Diagnosis Date   Asthma    CAD (coronary artery disease) 01/23/2016   minor CAD at cath-40% RCA   CHF (congestive heart failure) (HCC)    aortic valve replacement   COPD (chronic obstructive pulmonary disease) ([place])    denies patient said that a pulmonologist said he said that he didnt have COPD   Dyspnea    Dysrhythmia    hx afib   Heart murmur    had a murmur before  the aortic valve replacement, no murmur according to dr. Debara Pickett per patient   Hypertension    Non-ischemic cardiomyopathy ([place]) 01/23/2016   EF 30-35%   Pneumonia    Severe aortic stenosis    SOBOE (shortness of breath on exertion)     Past Surgical History:  Procedure Laterality Date   ABDOMINAL AORTOGRAM W/LOWER EXTREMITY N/A 10/16/2016   Procedure: Abdominal Aortogram w/Lower Extremity;  Surgeon: Wellington Hampshire, MD;  Location: [place] [facility];  Service: Cardiovascular;  Laterality: N/A;   AORTIC VALVE REPLACEMENT N/A 01/29/2016   Procedure: AORTIC VALVE REPLACEMENT (AVR) with Magna Ease size 84mm;  Surgeon: Ivin Poot, MD;  Location: [place];  Service: Open Heart Surgery;  Laterality: N/A;   CARDIAC CATHETERIZATION N/A 01/23/2016   Procedure: Right/Left Heart Cath and Coronary Angiography;  Surgeon: Pixie Casino, MD;  Location: [place] [facility];  Service: Cardiovascular;  Laterality: N/A;   CARDIOVERSION N/A 03/26/2016   Procedure: CARDIOVERSION;  Surgeon: Pixie Casino, MD;  Location: [facility];  Service: Cardiovascular;  Laterality: N/A;   CATARACT EXTRACTION  12/2002 & 09/2007   ENDARTERECTOMY FEMORAL Left 11/01/2016   Procedure: ENDARTERECTOMY FEMORAL;  Surgeon: Serafina Mitchell, MD;  Location: [place];  Service: Vascular;  Laterality: Left;   PATCH ANGIOPLASTY Left 11/01/2016   Procedure: PATCH ANGIOPLASTY;  Surgeon: Serafina Mitchell, MD;  Location: [facility]  OR;  Service: Vascular;  Laterality: Left;   SHOULDER SURGERY Right 1971   TEE WITHOUT CARDIOVERSION N/A 01/29/2016   Procedure: TRANSESOPHAGEAL ECHOCARDIOGRAM (TEE);  Surgeon: Ivin Poot, MD;  Location: [place];  Service: Open Heart Surgery;  Laterality: N/A;    Current Medications: Current Meds  Medication Sig   atorvastatin (LIPITOR) 10 MG tablet TAKE 1 TABLET BY MOUTH EVERY DAY   carvedilol (COREG) 3.125 MG tablet TAKE 1 TABLET BY MOUTH TWICE A DAY WITH A MEAL   finasteride (PROSCAR) 5 MG tablet Take 1 tablet (5 mg  total) by mouth daily.   furosemide (LASIX) 40 MG tablet TAKE 1 TABLET BY MOUTH EVERY DAY   losartan (COZAAR) 100 MG tablet Take 1 tablet by mouth once daily   rivaroxaban (XARELTO) 20 MG TABS tablet TAKE 1 TABLET BY MOUTH DAILY WITH SUPPER.   tamsulosin (FLOMAX) 0.4 MG CAPS capsule Take 0.4 mg by mouth daily.     Allergies:   No known allergies   Social History   Socioeconomic History   Marital status: Married    Spouse name: Not on file   Number of children: 4   Years of education: 10   Highest education level: Not on file  Occupational History   Occupation: Architect    Comment: Engineer, production  Tobacco Use   Smoking status: Former    Packs/day: 1.00    Years: 45.00    Pack years: 45.00    Types: Cigarettes    Quit date: 01/23/2016    Years since quitting: 5.5   Smokeless tobacco: Former  Scientific laboratory technician Use: Former   Quit date: 08/28/2015  Substance and Sexual Activity   Alcohol use: Yes    Alcohol/week: 12.0 standard drinks    Types: 12 Standard drinks or equivalent per week    Comment: beer   Drug use: No   Sexual activity: Not on file  Other Topics Concern   Not on file  Social History Narrative   Not on file   Social Determinants of Health   Financial Resource Strain: Not on file  Food Insecurity: Not on file  Transportation Needs: Not on file  Physical Activity: Not on file  Stress: Not on file  Social Connections: Not on file     Family History: The patient's family history includes Breast cancer in his mother and sister.  ROS:   Please see the history of present illness.     All other systems reviewed and are negative.  EKGs/Labs/Other Studies Reviewed:    The following studies were reviewed today:  Echo 01/08/2019 1. The left ventricle has normal systolic function, with an ejection  fraction of 55-60%. The cavity size was normal. There is mildly increased  left ventricular wall thickness. Left ventricular diastolic Doppler   parameters are consistent with  pseudonormalization.   2. The right ventricle has normal systolic function. The cavity was  normal.   3. There is mild mitral annular calcification present.   4. A 73mm Edwards Magna Ease valve is present in the aortic position.  Procedure Date: 12/31/2015 Normal aortic valve prosthesis.   5. There is mild dilatation of the ascending aorta measuring 38 mm.   6. Normal LV systolic function; mild LVH; moderate diastolic dysfunction;  s/p AVR with mean gradient of 21 mmHg and trace AI.   EKG:  EKG is ordered today.  The ekg ordered today demonstrates normal sinus rhythm, poor R wave progression in the anterior leads.  Recent Labs: No results found for requested labs within last 8760 hours.  Recent Lipid Panel    Component Value Date/Time   CHOL 138 01/10/2017 0941   TRIG 63 01/10/2017 0941   HDL 68 01/10/2017 0941   CHOLHDL 2.0 01/10/2017 0941   CHOLHDL 2.4 01/19/2016 1029   VLDL 11 01/19/2016 1029   LDLCALC 57 01/10/2017 0941     Risk Assessment/Calculations:    CHA2DS2-VASc Score = 3   This indicates a 3.2% annual risk of stroke. The patient's score is based upon: CHF History: 0 HTN History: 1 Diabetes History: 0 Stroke History: 0 Vascular Disease History: 1 Age Score: 1 Gender Score: 0          Physical Exam:    VS:  BP (!) 166/82    Pulse (!) 55    Ht 5\' 10"  (1.778 m)    Wt 177 lb 12.8 oz (80.6 kg)    SpO2 99%    BMI 25.51 kg/m     Wt Readings from Last 3 Encounters:  07/25/21 177 lb 12.8 oz (80.6 kg)  04/25/20 164 lb 9.6 oz (74.7 kg)  04/02/19 191 lb (86.6 kg)     GEN:  Well nourished, well developed in no acute distress HEENT: Normal NECK: No JVD; No carotid bruits LYMPHATICS: No lymphadenopathy CARDIAC: RRR, no murmurs, rubs, gallops RESPIRATORY:  Clear to auscultation without rales, wheezing or rhonchi  ABDOMEN: Soft, non-tender, non-distended MUSCULOSKELETAL:  No edema; No deformity  SKIN: Warm and dry NEUROLOGIC:   Alert and oriented x 3 PSYCHIATRIC:  Normal affect   ASSESSMENT:    1. S/P AVR (aortic valve replacement)   2. Coronary artery disease involving native coronary artery of native heart without angina pectoris   3. PAF (paroxysmal atrial fibrillation) ([place])   4. PAD (peripheral artery disease) ([place])   5. Primary hypertension   6. Hyperlipidemia LDL goal <70   7. Chronic obstructive pulmonary disease, unspecified COPD type ([place])    PLAN:    In order of problems listed above:  History of AVR: Denies any symptom of shortness of breath or chest discomfort.  Due for repeat echocardiogram this year.  CAD: History of mild nonobstructive CAD, denies any recent chest pain  PAF: Continue Xarelto and carvedilol  PAD: History of left femoral, superficial femoral and profundofemoral and the external iliac endarterectomy in 2018.  Last lower extremity ABI was in March 2020.  He denies any recent claudication symptoms.  We will discuss with Dr. Fletcher Anon regarding repeat study.  Hypertension: Blood pressure stable on current therapy  Hyperlipidemia: On Lipitor  COPD: No recent exacerbation           Medication Adjustments/Labs and Tests Ordered: Current medicines are reviewed at length with the patient today.  Concerns regarding medicines are outlined above.  Orders Placed This Encounter  Procedures   EKG 12-Lead   ECHOCARDIOGRAM COMPLETE   No orders of the defined types were placed in this encounter.   Patient Instructions  Medication Instructions:  Your physician recommends that you continue on your current medications as directed. Please refer to the Current Medication list given to you today.   *If you need a refill on your cardiac medications before your next appointment, please call your pharmacy*   Lab Work: NONE ordered at this time of appointment   If you have labs (blood work) drawn today and your tests are completely normal, you will receive your results only  by: [place] (if you have MyChart)  OR A paper copy in the mail If you have any lab test that is abnormal or we need to change your treatment, we will call you to review the results.   Testing/Procedures: Your physician has requested that you have an echocardiogram. Echocardiography is a painless test that uses sound waves to create images of your heart. It provides your doctor with information about the size and shape of your heart and how well your hearts chambers and valves are working. This procedure takes approximately one hour. There are no restrictions for this procedure.   Schedule for 3 months   Follow-Up: At [facility], you and your health needs are our priority.  As part of our continuing mission to provide you with exceptional heart care, we have created designated Provider Care Teams.  These Care Teams include your primary Cardiologist (physician) and Advanced Practice Providers (APPs -  Physician Assistants and Nurse Practitioners) who all work together to provide you with the care you need, when you need it.  We recommend signing up for the patient portal called "MyChart".  Sign up information is provided on this After Visit Summary.  MyChart is used to connect with patients for Virtual Visits (Telemedicine).  Patients are able to view lab/test results, encounter notes, upcoming appointments, etc.  Non-urgent messages can be sent to your provider as well.   To learn more about what you can do with MyChart, go to [email or web address].    Your next appointment:   1 year(s)  The format for your next appointment:   In Person  Provider:   Pixie Casino, MD     Other Instructions Please call if systolic bloop pressure is greater than 140 mmHg(top number)    Signed, Almyra Deforest, PA  07/27/2021 10:39 PM    [place]

## 2021-07-27 ENCOUNTER — Encounter: Payer: Self-pay | Admitting: Physician Assistant

## 2021-08-17 ENCOUNTER — Other Ambulatory Visit: Payer: Self-pay | Admitting: Internal Medicine

## 2021-08-18 ENCOUNTER — Other Ambulatory Visit: Payer: Self-pay | Admitting: Internal Medicine

## 2021-09-16 ENCOUNTER — Other Ambulatory Visit: Payer: Self-pay | Admitting: Internal Medicine

## 2021-09-21 DIAGNOSIS — H4312 Vitreous hemorrhage, left eye: Secondary | ICD-10-CM | POA: Diagnosis not present

## 2021-09-24 DIAGNOSIS — H4312 Vitreous hemorrhage, left eye: Secondary | ICD-10-CM | POA: Diagnosis not present

## 2021-10-04 DIAGNOSIS — H4312 Vitreous hemorrhage, left eye: Secondary | ICD-10-CM | POA: Diagnosis not present

## 2021-10-09 DIAGNOSIS — H4312 Vitreous hemorrhage, left eye: Secondary | ICD-10-CM | POA: Diagnosis not present

## 2021-10-09 DIAGNOSIS — Z01818 Encounter for other preprocedural examination: Secondary | ICD-10-CM | POA: Diagnosis not present

## 2021-10-26 ENCOUNTER — Ambulatory Visit (HOSPITAL_COMMUNITY): Payer: BC Managed Care – PPO | Attending: Cardiovascular Disease

## 2021-10-26 DIAGNOSIS — Z952 Presence of prosthetic heart valve: Secondary | ICD-10-CM | POA: Insufficient documentation

## 2021-10-26 LAB — ECHOCARDIOGRAM COMPLETE
AR max vel: 0.73 cm2
AV Area VTI: 0.83 cm2
AV Area mean vel: 0.74 cm2
AV Mean grad: 28 mmHg
AV Peak grad: 49.6 mmHg
Ao pk vel: 3.52 m/s
Area-P 1/2: 2.31 cm2
S' Lateral: 3.2 cm

## 2021-10-29 ENCOUNTER — Other Ambulatory Visit: Payer: Self-pay | Admitting: Internal Medicine

## 2021-10-30 NOTE — Telephone Encounter (Signed)
Prescription refill request for Xarelto received.  ?Indication: Afib  ?Last office visit: 07/25/21 [name])  ?Weight: 80.6kg ?Age: 66 ?Scr: 0.71 (07/06/21) ?CrCl: 113.39ml/min ? ?Appropriate dose and refill sent to requested pharmacy.  ?

## 2021-11-01 ENCOUNTER — Other Ambulatory Visit: Payer: Self-pay

## 2021-11-01 DIAGNOSIS — Z952 Presence of prosthetic heart valve: Secondary | ICD-10-CM

## 2021-11-09 ENCOUNTER — Telehealth: Payer: Self-pay | Admitting: Internal Medicine

## 2021-11-09 DIAGNOSIS — T85398D Other mechanical complication of other ocular prosthetic devices, implants and grafts, subsequent encounter: Secondary | ICD-10-CM | POA: Diagnosis not present

## 2021-11-09 NOTE — Telephone Encounter (Signed)
Calling to get results from Echo. Please advise ?

## 2021-11-09 NOTE — Telephone Encounter (Signed)
Spoke with pt regarding echo results. All questions answered. Pt verbalizes understanding.  ?

## 2021-12-25 ENCOUNTER — Other Ambulatory Visit: Payer: Self-pay | Admitting: Internal Medicine

## 2022-01-02 DIAGNOSIS — T85398D Other mechanical complication of other ocular prosthetic devices, implants and grafts, subsequent encounter: Secondary | ICD-10-CM | POA: Diagnosis not present

## 2022-01-02 DIAGNOSIS — H2512 Age-related nuclear cataract, left eye: Secondary | ICD-10-CM | POA: Diagnosis not present

## 2022-01-11 DIAGNOSIS — E7801 Familial hypercholesterolemia: Secondary | ICD-10-CM | POA: Diagnosis not present

## 2022-01-11 DIAGNOSIS — I10 Essential (primary) hypertension: Secondary | ICD-10-CM | POA: Diagnosis not present

## 2022-01-11 DIAGNOSIS — Z79899 Other long term (current) drug therapy: Secondary | ICD-10-CM | POA: Diagnosis not present

## 2022-04-27 ENCOUNTER — Other Ambulatory Visit: Payer: Self-pay | Admitting: Internal Medicine

## 2022-04-27 DIAGNOSIS — I480 Paroxysmal atrial fibrillation: Secondary | ICD-10-CM

## 2022-04-29 NOTE — Telephone Encounter (Signed)
Xarelto 20mg  refill request received. Pt is 66 years old, weight-80.6kg, Crea-0.71 on 07/06/2021 via scanned from [facility], last seen by Almyra Deforest on 07/25/2021, Diagnosis-Aflutter/afib, CrCl-113.52 mL/min; Dose is appropriate based on dosing criteria. Will send in refill to requested pharmacy.

## 2022-10-11 ENCOUNTER — Ambulatory Visit (HOSPITAL_COMMUNITY): Payer: BC Managed Care – PPO | Attending: Cardiovascular Disease

## 2022-10-11 DIAGNOSIS — Z952 Presence of prosthetic heart valve: Secondary | ICD-10-CM | POA: Diagnosis present

## 2022-10-11 LAB — ECHOCARDIOGRAM COMPLETE
AR max vel: 1.13 cm2
AV Area VTI: 1.16 cm2
AV Area mean vel: 1.15 cm2
AV Mean grad: 33 mmHg
AV Peak grad: 59.9 mmHg
Ao pk vel: 3.87 m/s
Area-P 1/2: 2.47 cm2
S' Lateral: 3.6 cm

## 2022-10-22 ENCOUNTER — Encounter: Payer: Self-pay | Admitting: Internal Medicine

## 2022-10-22 ENCOUNTER — Ambulatory Visit: Payer: BC Managed Care – PPO | Attending: Internal Medicine | Admitting: Internal Medicine

## 2022-10-22 VITALS — BP 132/76 | HR 50 | Ht 70.0 in | Wt 176.0 lb

## 2022-10-22 DIAGNOSIS — I2510 Atherosclerotic heart disease of native coronary artery without angina pectoris: Secondary | ICD-10-CM

## 2022-10-22 DIAGNOSIS — I10 Essential (primary) hypertension: Secondary | ICD-10-CM

## 2022-10-22 DIAGNOSIS — I480 Paroxysmal atrial fibrillation: Secondary | ICD-10-CM | POA: Diagnosis not present

## 2022-10-22 DIAGNOSIS — Z952 Presence of prosthetic heart valve: Secondary | ICD-10-CM

## 2022-10-22 DIAGNOSIS — E785 Hyperlipidemia, unspecified: Secondary | ICD-10-CM

## 2022-10-22 DIAGNOSIS — I739 Peripheral vascular disease, unspecified: Secondary | ICD-10-CM

## 2022-10-22 NOTE — Patient Instructions (Signed)
Medication Instructions:  Your physician recommends that you continue on your current medications as directed. Please refer to the Current Medication list given to you today.  *If you need a refill on your cardiac medications before your next appointment, please call your pharmacy*  Testing/Procedures: Your physician has requested that you have an echocardiogram in 12 MONTHS. Echocardiography is a painless test that uses sound waves to create images of your heart. It provides your doctor with information about the size and shape of your heart and how well your heart's chambers and valves are working. This procedure takes approximately one hour. There are no restrictions for this procedure. Please do NOT wear cologne, perfume, aftershave, or lotions (deodorant is allowed). Please arrive 15 minutes prior to your appointment time.  Follow-Up: At [facility], you and your health needs are our priority.  As part of our continuing mission to provide you with exceptional heart care, we have created designated Provider Care Teams.  These Care Teams include your primary Cardiologist (physician) and Advanced Practice Providers (APPs -  Physician Assistants and Nurse Practitioners) who all work together to provide you with the care you need, when you need it.  We recommend signing up for the patient portal called "MyChart".  Sign up information is provided on this After Visit Summary.  MyChart is used to connect with patients for Virtual Visits (Telemedicine).  Patients are able to view lab/test results, encounter notes, upcoming appointments, etc.  Non-urgent messages can be sent to your provider as well.   To learn more about what you can do with MyChart, go to [email or web address].    Your next appointment:   12 month(s)  Provider:   Chrystie Nose, MD

## 2022-10-22 NOTE — Progress Notes (Signed)
OFFICE NOTE  Chief Complaint:  No complaints  Primary Care Physician: Benjamin Done., MD  HPI:  [name] is a 67 y.o. male he was kindly referred to me for evaluation of new onset shortness of breath. He saw his primary care provider who had been following him for an abnormal cardiac murmur in the past. In fact [name] had previously seen a cardiologist in [place], probably more than 5 years ago who did an echocardiogram and then she had valvular heart disease which could get worse over time. He had not followed up with his cardiologist. On presentation last month to his primary care provider he was notably short of breath and underwent a chest x-ray which showed some mild pulmonary edema as well as COPD changes. He underwent office spirometry which indicated obstruction and possible restriction with some bronchodilator reversibility and an FEV1 less than 80% predicted. Subsequently he was referred to an echocardiogram which was performed at [facility]. I reviewed the interpretation which indicates mild LV dilatation and mild global hypokinesis with an EF of 45-50%. There was marketed dilatation of the left atrium, mild aortic insufficiency and severe aortic stenosis. The peak and mean gradients across the valve or 71 mmHg and 50 mmHg, respectively with a calculated aortic valve area of 0.62 cm. There was also mild to moderate mitral regurgitation. The study was performed on 12/01/2015. [name], but is still able to do physical activities. He denies any chest pain. Family history indicates death by natural causes of family members but no known coronary disease.  02/20/2016  [name] today returns today for hospital follow-up. He underwent fairly urgent cardiac catheterization by myself, the results of which were as follows:  Mid RCA lesion, 40 %stenosed. Prox RCA lesion, 10 %stenosed. Hemodynamic findings  consistent with moderate pulmonary hypertension, mitral valve regurgitation and aortic valve stenosis.   Non-obstructive CAD. Mildly reduced cardiac index. PA 58%, PCWP 28,  Mean PA 45. BNP was 1100 and CXR shows pulmonary edema. A repeat echocardiogram showed that EF has decreased even lower to 20-25%. He was admitted for diuresis and CT surgery was consulted for aortic valve replacement. After diuresis he underwent aortic valve replacement with a 25 mm pericardial Edwards magna ease valve on 01/29/2016, at which time he was found to have severe bicuspid aortic valve stenosis and insufficiency. He also had significant bilateral pleural effusions which were drained with chest tubes. Recovery was uneventful except that he developed atrial fibrillation on or about 02/01/2016. He was started on oral amiodarone and warfarin. After discharge he is INR remains subtherapeutic, but recently had increased to 1.9. A repeat INR checked today was 2.5. An EKG in the office today demonstrates atrial flutter with variable A-V response at 82. There is LVH by voltage. Mr. [name] reports reports feeling much better. He is not symptomatic with his atrial flutter. He is about to start cardiac rehabilitation at [facility].  03/22/2016  [name] returns today for follow-up. He remains in atrial flutter which is rate controlled. Overall he seems to be recovering from surgery. He's now had at least one month of consistent therapeutic INRs between 2 and 3. He recently saw Dr. Donata French in follow-up who felt that he was doing well from a cardiac standpoint. We had discussed cardioversion as an option if he remained in atrial flutter which is persistent. I did discuss risks and benefits of electrical cardioversion with  him today and he is agreeable to proceed.  05/21/2016  [name] returns for follow-up today. He is maintaining sinus rhythm on low dose amiodarone after cardioversion. He reports some improvement in his  symptoms. He still reports daytime fatigue and is noted to snore - I wonder if he has OSA. He says he is almost out of amiodarone. He denies worsening dyspnea or weight gain.  08/28/2016  [name] is seen today in follow-up. Overall he feels well although he recently has noted elevated blood pressure. He was seen at his pulmonologist office and blood pressure was over 200 systolic and greater than 100 diastolic. Today blood pressure was 182/94. Previously blood pressure had been well controlled. He has had an improvement in LV function after surgery - echo performed this month showed an improvement in LVEF up to 45-50% however it is not totally normalized. He denies any recurrent atrial fibrillation since he underwent recent cardioversion. He is on amiodarone but has weaned down to 200 mg daily. EKG today shows sinus rhythm with mild lateral ST and T-wave changes. He also reports today that he's been having some problems with left lower extremity pain. Particularly notes that after walking about 150 yards he gets some pain in his left calf. He noted this during cardiac rehabilitation and it does seem to persist. He's concerned about PAD.  10/03/2016  [name] is seen today follow-up. He reports that his blood pressure recently has been very elevated. Generally at home it runs between 1 6170 systolic. He says he doesn't feel well when it's at that level. He recently had peripheral lower extremity arterial Dopplers which indicated a reduced ABI 0.61 on the left and 1.1 on the right. He has been complaining of left lower extremity claudication. I've gone ahead and referred him to Dr. Kirke French for evaluation of this. He is also complaining of a lot of gas and bloating. He says he's tried simethicone without relief. His bowel movements are fairly normal. He says he thinks is related to medications however it is not clear which medication would cause this and I do not typically hear complaints of this. He could've  been related to surgery.  01/10/2017  [name] returns today for follow-up. He is now feeling very well. He denies any chest pain or worsening shortness of breath. He was having left lower extremity claudication and was seen by Dr. Benard Rink. He ultimately underwent atherectomy and patch angioplasty by Dr. Myra Gianotti and has had complete improvement in his left lower extremity claudication. He initially had some swelling but this is improved. He's had good wound healing in the left groin. He is scheduled to follow-up with Dr. Myra Gianotti in August. He would have repeat lower extremity arterial Dopplers in September and see Dr. Benard Rink. He has had some weight gain and is looking forward to starting to do more exercise. His last echo was in February 2018 which showed improvement in EF to 45-50%. He denies any bleeding problems on Xarelto. He was started on low-dose atorvastatin his LDL-C was above 70 (80, in 12/2015). He has not had a repeat lipid profile since then.  07/11/2017  [name] was seen in follow-up today. He is without complaint. He is doing well after valve replacement. He has been working with Dr. Kirke French and ultimately underwent intervention to the ostial profunda and SFA by Dr. Myra Gianotti of [facility], which required endarterectomy and patch angioplasty. Since then he has had significant improvement in his symptoms of claudication. He is also  noted to have gained weight since his last visit - today he is 219 lbs, up from 213 lbs in July.   04/03/2018  [name] is seen today in follow-up.  He continues to do well.  Recently he is continued to lose weight now down to 202 pounds from 219 in the beginning of the year.  He says he feels better and is been more active.  He is change his diet significantly.  He denies any chest pain or shortness of breath.  He denies any claudication although does get some achiness in his legs.  He does continue to have problems with sleep at night.  Mostly falling and staying  asleep.  He is using CPAP.  He has a nasal CPAP but often complains of dry mouth and I suspect would probably benefit from a full facemask.  He said that he slept much better last night after taking a Tylenol PM and I reiterated it could be okay for him to continue to do that.  10/22/2022  [name] returns today for follow-up.  Overall he seems to be doing pretty well.  He is under a lot of stress with his wife who has a number of medical issues and recently was diagnosed with cancer.  That being said he continues to work.  He is on his feet a lot and he has had history of PAD with left SFA intervention.  His last peripheral Dopplers were in 2020 but he denies any symptoms of any claudication.  He denies any chest pain or shortness of breath.  He had a recent repeat echo to reassess his 25 mm bioprosthetic aortic valve.  It appears that there has been an increase in gradient across that valve which was measured at 33 mmHg.  The DVI was unchanged at 0.24 suggesting a degree of probably moderate bioprosthetic aortic valve stenosis.  He is now 7 years out from his surgery.  PMHx:  Past Medical History:  Diagnosis Date   Asthma    CAD (coronary artery disease) 01/23/2016   minor CAD at cath-40% RCA   CHF (congestive heart failure)    aortic valve replacement   COPD (chronic obstructive pulmonary disease)    denies patient said that a pulmonologist said he said that he didnt have COPD   Dyspnea    Dysrhythmia    hx afib   Heart murmur    had a murmur before the aortic valve replacement, no murmur according to dr. Rennis Golden per patient   Hypertension    Non-ischemic cardiomyopathy 01/23/2016   EF 30-35%   Pneumonia    Severe aortic stenosis    SOBOE (shortness of breath on exertion)     Past Surgical History:  Procedure Laterality Date   ABDOMINAL AORTOGRAM W/LOWER EXTREMITY N/A 10/16/2016   Procedure: Abdominal Aortogram w/Lower Extremity;  Surgeon: [name], MD;  Location: [facility] INVASIVE  CV LAB;  Service: Cardiovascular;  Laterality: N/A;   AORTIC VALVE REPLACEMENT N/A 01/29/2016   Procedure: AORTIC VALVE REPLACEMENT (AVR) with Magna Ease size 25mm;  Surgeon: [name], MD;  Location: [facility] OR;  Service: Open Heart Surgery;  Laterality: N/A;   CARDIAC CATHETERIZATION N/A 01/23/2016   Procedure: Right/Left Heart Cath and Coronary Angiography;  Surgeon: Chrystie Nose, MD;  Location: [facility] INVASIVE CV LAB;  Service: Cardiovascular;  Laterality: N/A;   CARDIOVERSION N/A 03/26/2016   Procedure: CARDIOVERSION;  Surgeon: Chrystie Nose, MD;  Location: [facility] ENDOSCOPY;  Service: Cardiovascular;  Laterality: N/A;  CATARACT EXTRACTION  12/2002 & 09/2007   ENDARTERECTOMY FEMORAL Left 11/01/2016   Procedure: ENDARTERECTOMY FEMORAL;  Surgeon: [name], MD;  Location: [facility] OR;  Service: Vascular;  Laterality: Left;   PATCH ANGIOPLASTY Left 11/01/2016   Procedure: PATCH ANGIOPLASTY;  Surgeon: [name], MD;  Location: [facility] OR;  Service: Vascular;  Laterality: Left;   SHOULDER SURGERY Right 1971   TEE WITHOUT CARDIOVERSION N/A 01/29/2016   Procedure: TRANSESOPHAGEAL ECHOCARDIOGRAM (TEE);  Surgeon: [name], MD;  Location: [facility] OR;  Service: Open Heart Surgery;  Laterality: N/A;    FAMHx:  Family History  Problem Relation Age of Onset   Breast cancer Mother    Breast cancer Sister     SOCHx:   reports that he quit smoking about 6 years ago. His smoking use included cigarettes. He has a 45.00 pack-year smoking history. He has quit using smokeless tobacco. He reports current alcohol use of about 12.0 standard drinks of alcohol per week. He reports that he does not use drugs.  ALLERGIES:  Allergies  Allergen Reactions   No Known Allergies     ROS: Pertinent items noted in HPI and remainder of comprehensive ROS otherwise negative.  HOME MEDS: Current Outpatient Medications  Medication Sig Dispense Refill   atorvastatin (LIPITOR) 10 MG tablet TAKE 1 TABLET BY MOUTH EVERY DAY  90 tablet 3   carvedilol (COREG) 3.125 MG tablet TAKE 1 TABLET BY MOUTH TWICE A DAY WITH MEALS 60 tablet 9   finasteride (PROSCAR) 5 MG tablet Take 1 tablet (5 mg total) by mouth daily. 30 tablet 1   losartan (COZAAR) 100 MG tablet Take 1 tablet by mouth once daily 90 tablet 0   tamsulosin (FLOMAX) 0.4 MG CAPS capsule Take 0.4 mg by mouth daily.  1   XARELTO 20 MG TABS tablet TAKE 1 TABLET BY MOUTH DAILY WITH SUPPER 90 tablet 1   furosemide (LASIX) 40 MG tablet TAKE 1 TABLET BY MOUTH EVERY DAY (Patient not taking: Reported on 10/22/2022) 90 tablet 3   No current facility-administered medications for this visit.    LABS/IMAGING: No results found for this or any previous visit (from the past 48 hour(s)). No results found.  WEIGHTS: Wt Readings from Last 3 Encounters:  10/22/22 176 lb (79.8 kg)  07/25/21 177 lb 12.8 oz (80.6 kg)  04/25/20 164 lb 9.6 oz (74.7 kg)    VITALS: BP 132/76   Pulse (!) 50   Ht  (1.778 m)   Wt 176 lb (79.8 kg)   SpO2 97%   BMI 25.25 kg/m   EXAM: General appearance: alert, no distress, and normal weight Neck: no carotid bruit and no JVD Lungs: clear to auscultation bilaterally Heart: regular rate and rhythm, S1, S2 normal, and systolic murmur: systolic ejection 3/6, mid peaking at 2nd right intercostal space Abdomen: soft, non-tender; bowel sounds normal; no masses,  no organomegaly Extremities: extremities normal, atraumatic, no cyanosis or edema Pulses: 2+ and symmetric Skin: Skin color, texture, turgor normal. No rashes or lesions Neurologic: Grossly normal Psych: Pleasant  EKG: Sinus bradycardia with PVCs at 50-personally reviewed  ASSESSMENT: Severe symptomatic bicuspid calcific aortic stenosis - s/p 25 mm Edwards MagnaEase tissue valve (12/2015) Mild to moderate mitral regurgitation with "marked" left atrial enlargement COPD - quit smoking (2017) Dilated cardiomyopathy with EF 45-50% -> reduced to 20-25% perioperatively, now improved to  45-50% (08/2016) Persistent postoperative atrial flutter-CHADSVASC score of 3 (on warfarin), s/p cardioversion and maintaining sinus Claudication- status post left common  femoral, superficial femoral, profunda femoral and external iliac endarterectomy and bovine pericardial patch angioplasty (10/2016) Hypertension Gas/bloating  PLAN: 1.   [name] continues to be asymptomatic.  He is very active at work and has no symptoms of any PAD or claudication.  He denies any shortness of breath or chest pain however his recent repeat echo does show an increase in his aortic valve gradient suggesting a degree of bioprosthetic aortic valve dysfunction.  On exam it sounds like he has moderate aortic stenosis based on the timing of his murmur.  I would favor clinically following this at this time.  He is possibly a candidate for TAVR at some point if this progresses as his 25 mm valve may accommodate that.  He has had no recurrent A-fib that he is aware of.  He is on Xarelto without bleeding issues.  Blood pressure appears well-controlled.  Will continue his current medicines.  Follow-up with me annually or sooner as necessary  Chrystie Nose, MD, [facility], FACP  [place]  [facility]  Medical Director of the Advanced Lipid Disorders &  Cardiovascular Risk Reduction Clinic Diplomate of the American Board of Clinical Lipidology Attending Cardiologist  Direct Dial: [phone]  Fax: [phone]  Website:  [email or web address]..[place] 10/22/2022, 9:46 AM

## 2022-10-25 ENCOUNTER — Other Ambulatory Visit: Payer: Self-pay | Admitting: Internal Medicine

## 2022-10-25 DIAGNOSIS — I480 Paroxysmal atrial fibrillation: Secondary | ICD-10-CM

## 2022-10-25 NOTE — Telephone Encounter (Signed)
Xarelto 20mg  refill request received. Pt is 67 years old, weight-79.8kg, Crea-0.54 on 07/20/22 via [employer], last seen by Dr. Rennis Golden on 10/22/22, Diagnosis-Aflutter, CrCl-145.73 mL/min; Dose is appropriate based on dosing criteria. Will send in refill to requested pharmacy.

## 2023-05-28 ENCOUNTER — Other Ambulatory Visit: Payer: Self-pay | Admitting: Internal Medicine

## 2023-05-28 DIAGNOSIS — I480 Paroxysmal atrial fibrillation: Secondary | ICD-10-CM

## 2023-05-28 NOTE — Telephone Encounter (Signed)
Prescription refill request for Xarelto received.  Indication: PAF Last office visit: 10/22/22  Ellene Route MD Weight: 79.8kg Age: 67 Scr: 0.64 on 01/31/23  [facility] CrCl: 122.96  Based on above findings Xarelto 20mg  daily is the appropriate dose.  Refill approved.

## 2023-09-30 ENCOUNTER — Telehealth: Payer: Self-pay | Admitting: *Deleted

## 2023-09-30 NOTE — Telephone Encounter (Signed)
   Patient Name: [name]  DOB: 24-Apr-1956 MRN: [id number]  Primary Cardiologist: Chrystie Nose, MD  Chart reviewed as part of pre-operative protocol coverage. Pre-op clearance already addressed by colleagues in earlier phone notes. To summarize recommendations:  -Per office protocol, patient can hold Xarelto for 2 days prior to procedure.  Please resume when medically safe to do so.  No medical clearance was requested.  Will route this bundled recommendation to requesting provider via Epic fax function and remove from pre-op pool. Please call with questions.  Sharlene Dory, PA-C 09/30/2023, 2:02 PM

## 2023-09-30 NOTE — Telephone Encounter (Signed)
 Patient with diagnosis of PAF and s/p AVR  on Xarelto  for anticoagulation.    Procedure: Colonoscopy  Date of procedure: 12/15/23   CHA2DS2-VASc Score = 4   This indicates a 4.8% annual risk of stroke. The patient's score is based upon: CHF History: 1 HTN History: 1 Diabetes History: 0 Stroke History: 0 Vascular Disease History: 1 Age Score: 1 Gender Score: 0     CrCl 90 mL/min   Per office protocol, patient can hold Xarelto for 2 days prior to procedure.     **This guidance is not considered finalized until pre-operative APP has relayed final recommendations.**

## 2023-09-30 NOTE — Telephone Encounter (Signed)
   Pre-operative Risk Assessment    Patient Name: [name]  DOB: Mar 25, 1956 MRN: [id number]   Date of last office visit:  09-2022 Date of next office visit: 10-2023    Request for Surgical Clearance    Procedure:   COLONOSCOPY  Date of Surgery:  CLEARANCE 11-14-2023                                 Surgeon:  DR Rise Patience Group or Practice Name:  [facility]  Phone number:  [phone] Fax number:  [phone]    Type of Clearance Requested:   - Pharmacy:  Hold Rivaroxaban (Xarelto) HOLD 2 DAYS PRIOR    Type of Anesthesia:  General    Signed, Shea Evans M   09/30/2023, 11:27 AM

## 2023-10-01 NOTE — Telephone Encounter (Signed)
 I will update the requesting office to see the notes from preop APP. Pt has appt with Dr. Rennis Golden 11/05/23.

## 2023-10-01 NOTE — Telephone Encounter (Signed)
   Name: [name]  DOB: February 27, 1956  MRN: [id number]  Primary Cardiologist: Chrystie Nose, MD  Chart reviewed as part of pre-operative protocol coverage. The patient has an upcoming visit scheduled with Dr. Rennis Golden on 11/05/2023 at which time clearance can be addressed in case there are any issues that would impact surgical recommendations.  Colonoscopy is not scheduled until 11/14/2023 as below. I added preop FYI to appointment note so that provider is aware to address at time of outpatient visit.  Per office protocol the cardiology provider should forward their finalized clearance decision and recommendations regarding antiplatelet therapy to the requesting party below.    Per office protocol, patient can hold Xarelto for 2 days prior to procedure.    I will route this message as FYI to requesting party and remove this message from the preop box as separate preop APP input not needed at this time.   Please call with any questions.  Denyce Robert, NP  10/01/2023, 1:47 PM

## 2023-10-01 NOTE — Telephone Encounter (Signed)
 Requesting office called the office: Hi Grabiel Schmutz,Tina from [facility] is calling. She said they receive medicine clearance, they still need medical clearance. She said they were both  on the same page.   I will send back to preop APP to review for medical clearance as well.

## 2023-10-22 ENCOUNTER — Ambulatory Visit (HOSPITAL_COMMUNITY): Payer: BC Managed Care – PPO | Attending: Cardiovascular Disease

## 2023-10-22 DIAGNOSIS — Z952 Presence of prosthetic heart valve: Secondary | ICD-10-CM

## 2023-10-22 LAB — ECHOCARDIOGRAM COMPLETE
AR max vel: 0.7 cm2
AV Area VTI: 0.73 cm2
AV Area mean vel: 0.64 cm2
AV Mean grad: 40 mmHg
AV Peak grad: 70.2 mmHg
Ao pk vel: 4.19 m/s
Area-P 1/2: 1.89 cm2
P 1/2 time: 832 ms
S' Lateral: 4.65 cm

## 2023-11-05 ENCOUNTER — Telehealth: Payer: Self-pay | Admitting: Pharmacy Technician

## 2023-11-05 ENCOUNTER — Other Ambulatory Visit: Payer: Self-pay

## 2023-11-05 ENCOUNTER — Other Ambulatory Visit (HOSPITAL_COMMUNITY): Payer: Self-pay

## 2023-11-05 ENCOUNTER — Ambulatory Visit: Payer: BC Managed Care – PPO | Attending: Internal Medicine | Admitting: Internal Medicine

## 2023-11-05 ENCOUNTER — Encounter: Payer: Self-pay | Admitting: Internal Medicine

## 2023-11-05 VITALS — BP 132/70 | HR 69 | Ht 70.0 in | Wt 171.4 lb

## 2023-11-05 DIAGNOSIS — Z952 Presence of prosthetic heart valve: Secondary | ICD-10-CM | POA: Insufficient documentation

## 2023-11-05 DIAGNOSIS — I5022 Chronic systolic (congestive) heart failure: Secondary | ICD-10-CM | POA: Insufficient documentation

## 2023-11-05 DIAGNOSIS — T82857D Stenosis of cardiac prosthetic devices, implants and grafts, subsequent encounter: Secondary | ICD-10-CM | POA: Diagnosis present

## 2023-11-05 DIAGNOSIS — I10 Essential (primary) hypertension: Secondary | ICD-10-CM | POA: Insufficient documentation

## 2023-11-05 MED ORDER — EMPAGLIFLOZIN 10 MG PO TABS
10.0000 mg | ORAL_TABLET | Freq: Every day | ORAL | 11 refills | Status: DC
  Filled 2023-11-05 (×2): qty 30, 30d supply, fill #0

## 2023-11-05 MED ORDER — EMPAGLIFLOZIN 10 MG PO TABS: 10.0000 mg | ORAL_TABLET | Freq: Every day | ORAL | 0 refills | Status: DC

## 2023-11-05 NOTE — Progress Notes (Signed)
 OFFICE NOTE  Chief Complaint:  No complaints  Primary Care Physician: Lucius Sabins., MD  HPI:  [name] is a 68 y.o. male he was kindly referred to me for evaluation of new onset shortness of breath. He saw his primary care provider who had been following him for an abnormal cardiac murmur in the past. In fact [name] had previously seen a cardiologist in [place] , probably more than 5 years ago who did an echocardiogram and then she had valvular heart disease which could get worse over time. He had not followed up with his cardiologist. On presentation last month to his primary care provider he was notably short of breath and underwent a chest x-ray which showed some mild pulmonary edema as well as COPD changes. He underwent office spirometry which indicated obstruction and possible restriction with some bronchodilator reversibility and an FEV1 less than 80% predicted. Subsequently he was referred to an echocardiogram which was performed at [facility]. I reviewed the interpretation which indicates mild LV dilatation and mild global hypokinesis with an EF of 45-50%. There was marketed dilatation of the left atrium, mild aortic insufficiency and severe aortic stenosis. The peak and mean gradients across the valve or 71 mmHg and 50 mmHg, respectively with a calculated aortic valve area of 0.62 cm. There was also mild to moderate mitral regurgitation. The study was performed on 12/01/2015. [name], but is still able to do physical activities. He denies any chest pain. Family history indicates death by natural causes of family members but no known coronary disease.  02/20/2016  [name] today returns today for hospital follow-up. He underwent fairly urgent cardiac catheterization by myself, the results of which were as follows:  Mid RCA lesion, 40 %stenosed. Prox RCA lesion, 10 %stenosed. Hemodynamic findings  consistent with moderate pulmonary hypertension, mitral valve regurgitation and aortic valve stenosis.   Non-obstructive CAD. Mildly reduced cardiac index. PA 58%, PCWP 28,  Mean PA 45. BNP was 1100 and CXR shows pulmonary edema. A repeat echocardiogram showed that EF has decreased even lower to 20-25%. He was admitted for diuresis and CT surgery was consulted for aortic valve replacement. After diuresis he underwent aortic valve replacement with a 25 mm pericardial Edwards magna ease valve on 01/29/2016, at which time he was found to have severe bicuspid aortic valve stenosis and insufficiency. He also had significant bilateral pleural effusions which were drained with chest tubes. Recovery was uneventful except that he developed atrial fibrillation on or about 02/01/2016. He was started on oral amiodarone  and warfarin. After discharge he is INR remains subtherapeutic, but recently had increased to 1.9. A repeat INR checked today was 2.5. An EKG in the office today demonstrates atrial flutter with variable A-V response at 82. There is LVH by voltage. [name] reports reports feeling much better. He is not symptomatic with his atrial flutter. He is about to start cardiac rehabilitation at [facility].  03/22/2016  [name] returns today for follow-up. He remains in atrial flutter which is rate controlled. Overall he seems to be recovering from surgery. He's now had at least one month of consistent therapeutic INRs between 2 and 3. He recently saw Dr. Matt Song in follow-up who felt that he was doing well from a cardiac standpoint. We had discussed cardioversion as an option if he remained in atrial flutter which is persistent. I did discuss risks and benefits of electrical cardioversion with  him today and he is agreeable to proceed.  05/21/2016  [name] returns for follow-up today. He is maintaining sinus rhythm on low dose amiodarone  after cardioversion. He reports some improvement in his  symptoms. He still reports daytime fatigue and is noted to snore - I wonder if he has OSA. He says he is almost out of amiodarone . He denies worsening dyspnea or weight gain.  08/28/2016  [name] is seen today in follow-up. Overall he feels well although he recently has noted elevated blood pressure. He was seen at his pulmonologist office and blood pressure was over 200 systolic and greater than 100 diastolic. Today blood pressure was 182/94. Previously blood pressure had been well controlled. He has had an improvement in LV function after surgery - echo performed this month showed an improvement in LVEF up to 45-50% however it is not totally normalized. He denies any recurrent atrial fibrillation since he underwent recent cardioversion. He is on amiodarone  but has weaned down to 200 mg daily. EKG today shows sinus rhythm with mild lateral ST and T-wave changes. He also reports today that he's been having some problems with left lower extremity pain. Particularly notes that after walking about 150 yards he gets some pain in his left calf. He noted this during cardiac rehabilitation and it does seem to persist. He's concerned about PAD.  10/03/2016  [name] is seen today follow-up. He reports that his blood pressure recently has been very elevated. Generally at home it runs between 1 6170 systolic. He says he doesn't feel well when it's at that level. He recently had peripheral lower extremity arterial Dopplers which indicated a reduced ABI 0.61 on the left and 1.1 on the right. He has been complaining of left lower extremity claudication. I've gone ahead and referred him to Dr. Alvenia Aus for evaluation of this. He is also complaining of a lot of gas and bloating. He says he's tried simethicone without relief. His bowel movements are fairly normal. He says he thinks is related to medications however it is not clear which medication would cause this and I do not typically hear complaints of this. He could've  been related to surgery.  01/10/2017  [name] returns today for follow-up. He is now feeling very well. He denies any chest pain or worsening shortness of breath. He was having left lower extremity claudication and was seen by Dr. Mardene Shake. He ultimately underwent atherectomy and patch angioplasty by Dr. Charlotte Cookey and has had complete improvement in his left lower extremity claudication. He initially had some swelling but this is improved. He's had good wound healing in the left groin. He is scheduled to follow-up with Dr. Charlotte Cookey in August. He would have repeat lower extremity arterial Dopplers in September and see Dr. Mardene Shake. He has had some weight gain and is looking forward to starting to do more exercise. His last echo was in February 2018 which showed improvement in EF to 45-50%. He denies any bleeding problems on Xarelto . He was started on low-dose atorvastatin  his LDL-C was above 70 (80, in 12/2015). He has not had a repeat lipid profile since then.  07/11/2017  [name] was seen in follow-up today. He is without complaint. He is doing well after valve replacement. He has been working with Dr. Alvenia Aus and ultimately underwent intervention to the ostial profunda and SFA by Dr. Charlotte Cookey of [facility], which required endarterectomy and patch angioplasty. Since then he has had significant improvement in his symptoms of claudication. He is also  noted to have gained weight since his last visit - today he is 219 lbs, up from 213 lbs in July.   04/03/2018  [name] is seen today in follow-up.  He continues to do well.  Recently he is continued to lose weight now down to 202 pounds from 219 in the beginning of the year.  He says he feels better and is been more active.  He is change his diet significantly.  He denies any chest pain or shortness of breath.  He denies any claudication although does get some achiness in his legs.  He does continue to have problems with sleep at night.  Mostly falling and staying  asleep.  He is using CPAP.  He has a nasal CPAP but often complains of dry mouth and I suspect would probably benefit from a full facemask.  He said that he slept much better last night after taking a Tylenol  PM and I reiterated it could be okay for him to continue to do that.  10/22/2022  [name] returns today for follow-up.  Overall he seems to be doing pretty well.  He is under a lot of stress with his wife who has a number of medical issues and recently was diagnosed with cancer.  That being said he continues to work.  He is on his feet a lot and he has had history of PAD with left SFA intervention.  His last peripheral Dopplers were in 2020 but he denies any symptoms of any claudication.  He denies any chest pain or shortness of breath.  He had a recent repeat echo to reassess his 25 mm bioprosthetic aortic valve.  It appears that there has been an increase in gradient across that valve which was measured at 33 mmHg.  The DVI was unchanged at 0.24 suggesting a degree of probably moderate bioprosthetic aortic valve stenosis.  He is now 7 years out from his surgery.  11/05/2023  [name] is seen today in follow-up.  He continues to do well.  Unfortunately his wife passed away last year with metastatic lung cancer.  He has started smoking again.  He had quit for a number of years.  He is interested in stopping again.  He had a recent repeat echo.  I been concerned because of increasing valve gradients noted on his echo.  His last echo showed a valve gradient of 33 mmHg.  He just had a repeat echo.  His LVEF actually has declined somewhat now down to 45 to 50% with some inferior and inferoseptal wall motion abnormalities.  The valve gradient has increased now to a mean gradient of 40 mmHg suggesting severe bioprosthetic aortic valve stenosis.  Despite this he says he has no symptoms no chest pain, no shortness of breath and remains very active, mowing his lawn and doing a lot of housework and projects.   He is also playing with his grandkids regularly.  PMHx:  Past Medical History:  Diagnosis Date   Asthma    CAD (coronary artery disease) 01/23/2016   minor CAD at cath-40% RCA   CHF (congestive heart failure) (HCC)    aortic valve replacement   COPD (chronic obstructive pulmonary disease) (HCC)    denies patient said that a pulmonologist said he said that he didnt have COPD   Dyspnea    Dysrhythmia    hx afib   Heart murmur    had a murmur before the aortic valve replacement, no murmur according to dr. Maximo Spar per  patient   Hypertension    Non-ischemic cardiomyopathy (HCC) 01/23/2016   EF 30-35%   Pneumonia    Severe aortic stenosis    SOBOE (shortness of breath on exertion)     Past Surgical History:  Procedure Laterality Date   ABDOMINAL AORTOGRAM W/LOWER EXTREMITY N/A 10/16/2016   Procedure: Abdominal Aortogram w/Lower Extremity;  Surgeon: Wenona Hamilton, MD;  Location: [facility] INVASIVE CV LAB;  Service: Cardiovascular;  Laterality: N/A;   AORTIC VALVE REPLACEMENT N/A 01/29/2016   Procedure: AORTIC VALVE REPLACEMENT (AVR) with Magna Ease size 25mm;  Surgeon: Heriberto London, MD;  Location: [facility] OR;  Service: Open Heart Surgery;  Laterality: N/A;   CARDIAC CATHETERIZATION N/A 01/23/2016   Procedure: Right/Left Heart Cath and Coronary Angiography;  Surgeon: Hazle Lites, MD;  [facility] INVAS[facility] CV LAB;  Service: Cardiovascular;  Laterality: N/A;   CARDIOVERSION N/A 03/26/2016   Procedure: CARDIOVERSION;  Surgeon: Hazle Lites, MD;  Location: [facility] ENDOSCOPY;  Service: Cardiovascular;  Laterality: N/A;   CATARACT EXTRACTION  12/2002 & 09/2007   ENDARTERECTOMY FEMORAL Left 11/01/2016   Procedure: ENDARTERECTOMY FEMORAL;  Surgeon: Margherita Shell, MD;  Location: [facility] OR;  Service: Vascular;  Laterality: Left;   PATCH ANGIOPLASTY Left 11/01/2016   Procedure: PATCH ANGIOPLASTY;  Surgeon: Margherita Shell, MD;  Location: [facility] OR;  Service: Vascular;  Laterality: Left;   SHOULDER SURGERY Right  1971   TEE WITHOUT CARDIOVERSION N/A 01/29/2016   Procedure: TRANSESOPHAGEAL ECHOCARDIOGRAM (TEE);  Surgeon: Heriberto London, MD;  Location[facility] OR;  Service: Open Heart Surgery;  Laterality: N/A;    FAMHx:  Family History  Problem Relation Age of Onset   Breast cancer Mother    Breast cancer Sister     SOCHx:   reports that he quit smoking about 7 years ago. His smoking use included cigarettes. He started smoking about 52 years ago. He has a 45 pack-year smoking history. He has quit using smokeless tobacco. He reports current alcohol use of about 12.0 standard drinks of alcohol per week. He reports that he does not use drugs.  ALLERGIES:  Allergies  Allergen Reactions   No Known Allergies     ROS: Pertinent items noted in HPI and remainder of comprehensive ROS otherwise negative.  HOME MEDS: Current Outpatient Medications  Medication Sig Dispense Refill   atorvastatin  (LIPITOR) 10 MG tablet TAKE 1 TABLET BY MOUTH EVERY DAY 90 tablet 3   carvedilol  (COREG ) 3.125 MG tablet TAKE 1 TABLET BY MOUTH TWICE A DAY WITH MEALS 60 tablet 9   finasteride  (PROSCAR ) 5 MG tablet Take 1 tablet (5 mg total) by mouth daily. 30 tablet 1   furosemide  (LASIX ) 40 MG tablet TAKE 1 TABLET BY MOUTH EVERY DAY 90 tablet 3   losartan  (COZAAR ) 100 MG tablet Take 1 tablet by mouth once daily 90 tablet 0   tamsulosin  (FLOMAX ) 0.4 MG CAPS capsule Take 0.4 mg by mouth daily.  1   XARELTO  20 MG TABS tablet TAKE 1 TABLET BY MOUTH DAILY WITH SUPPER 90 tablet 1   No current facility-administered medications for this visit.    LABS/IMAGING: No results found for this or any previous visit (from the past 48 hours). No results found.  WEIGHTS: Wt Readings from Last 3 Encounters:  11/05/23 171 lb 6.4 oz (77.7 kg)  10/22/22 176 lb (79.8 kg)  07/25/21 177 lb 12.8 oz (80.6 kg)    VITALS: BP 132/70 (BP Location: Right Arm, Patient Position: Sitting, Cuff Size: Normal)  Pulse 69   Ht 5\' 10"  (1.778 m)   Wt 171  lb 6.4 oz (77.7 kg)   SpO2 98%   BMI 24.59 kg/m   EXAM: General appearance: alert, no distress, and normal weight Neck: no carotid bruit and no JVD Lungs: clear to auscultation bilaterally Heart: regular rate and rhythm, S1, S2 normal, and systolic murmur: systolic ejection 3/6, mid peaking at 2nd right intercostal space Abdomen: soft, non-tender; bowel sounds normal; no masses,  no organomegaly Extremities: extremities normal, atraumatic, no cyanosis or edema Pulses: 2+ and symmetric Skin: Skin color, texture, turgor normal. No rashes or lesions Neurologic: Grossly normal Psych: Pleasant  EKG: EKG Interpretation Date/Time:  Wednesday Nov 05 2023 08:46:40 EDT Ventricular Rate:  53 PR Interval:  184 QRS Duration:  114 QT Interval:  412 QTC Calculation: 386 R Axis:   75  Text Interpretation: Sinus bradycardia with marked sinus arrhythmia Minimal voltage criteria for LVH, may be normal variant ( Cornell product ) Possible Anteroseptal infarct , age undetermined When compared with ECG of 26-Mar-2016 11:40, Possible Anteroseptal infarct is now Present Nonspecific T wave abnormality, improved in Lateral leads QT has shortened Confirmed by Dinah Franco [phone]) on 11/05/2023 9:02:08 AM    ASSESSMENT: Severe symptomatic bicuspid calcific aortic stenosis - s/p 25 mm Edwards MagnaEase tissue valve (12/2015) Mild to moderate mitral regurgitation with "marked" left atrial enlargement COPD - quit smoking (2017) 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   [name] denies any symptoms such as chest pain or  shortness of breath.  His echo now shows EF 45 to 50% which it has been in the past although it apparently had normalized last year.  I am not sure if that was potentially over read.  Nonetheless, would advise adding some additional therapy for heart failure.  He is currently only on ARB and beta-blocker.  Will add Jardiance 10 mg daily.  Will plan a repeat echo in 14-month since he is asymptomatic and he is encouraged to contact me if he develops any symptoms prior to that time.  Follow-up with me in 6 months or sooner as necessary  Hazle Lites, MD, [facility], FNLA, FACP  [place]  [facility]  Medical Director of the Advanced Lipid Disorders &  Cardiovascular Risk Reduction Clinic Diplomate of the American Board of Clinical Lipidology Attending Cardiologist  Direct Dial: [phone]  Fax: [phone]  Website:  [email or web address] Catrell Morrone 11/05/2023, 9:02 AM

## 2023-11-05 NOTE — Patient Instructions (Signed)
 Medication Instructions:  START Jardiance 10mg  once daily   *If you need a refill on your cardiac medications before your next appointment, please call your pharmacy*  Lab Work: Non-Fasting BMET in 2 weeks to check kidney function and electrolytes  If you have labs (blood work) drawn today and your tests are completely normal, you will receive your results only by: MyChart Message (if you have MyChart) OR A paper copy in the mail If you have any lab test that is abnormal or we need to change your treatment, we will call you to review the results.  Testing/Procedures: Your physician has requested that you have an echocardiogram. Echocardiography is a painless test that uses sound waves to create images of your heart. It provides your doctor with information about the size and shape of your heart and how well your heart's chambers and valves are working. This procedure takes approximately one hour. There are no restrictions for this procedure. Please do NOT wear cologne, perfume, aftershave, or lotions (deodorant is allowed). Please arrive 15 minutes prior to your appointment time.  Please note: We ask at that you not bring children with you during ultrasound (echo/ vascular) testing. Due to room size and safety concerns, children are not allowed in the ultrasound rooms during exams. Our front office staff cannot provide observation of children in our lobby area while testing is being conducted. An adult accompanying a patient to their appointment will only be allowed in the ultrasound room at the discretion of the ultrasound technician under special circumstances. We apologize for any inconvenience. DUE 05/2024  Follow-Up: At [facility], you and your health needs are our priority.  As part of our continuing mission to provide you with exceptional heart care, our providers are all part of one team.  This team includes your primary Cardiologist (physician) and Advanced Practice  Providers or APPs (Physician Assistants and Nurse Practitioners) who all work together to provide you with the care you need, when you need it.  Your next appointment:    6 months with Dr. Maximo Spar -- after echocardiogram  We recommend signing up for the patient portal called "MyChart".  Sign up information is provided on this After Visit Summary.  MyChart is used to connect with patients for Virtual Visits (Telemedicine).  Patients are able to view lab/test results, encounter notes, upcoming appointments, etc.  Non-urgent messages can be sent to your provider as well.   To learn more about what you can do with MyChart, go to [email or web address].   Other Instructions

## 2023-11-05 NOTE — Telephone Encounter (Signed)
 Patient Advocate Encounter   The patient was approved for a [employer] grant that will help cover the cost of jardiance Total amount awarded, 4500.  Effective: 10/06/23 - 10/04/24   [id number] YQM:[id number] [employer] ID: [id number]   Pharmacy provided with approval and processing information. Patient informed via telephone   Patient asked for rx to be mailed -done

## 2023-11-06 NOTE — Telephone Encounter (Signed)
   Name: [name]  DOB: [birth year]  MRN: [id number]   Primary Cardiologist: Hazle Lites, MD  Chart reviewed as part of pre-operative protocol coverage. DERREL LITTEKEN was last seen on 11/05/2023 by Dr. Maximo Spar.  Per Dr. Maximo Spar "cleared for colonoscopy - hold Xarelto  2 days prior to procedure. Restart when safe after."  Therefore, based on ACC/AHA guidelines, the patient would be an acceptable risk for the planned procedure without further cardiovascular testing.   Per Pharm D, patient may hold Xarelto  for 2 days prior to procedure.    I will route this recommendation to the requesting party via Epic fax function and remove from pre-op pool. Please call with questions.  Morey Ar, NP 11/06/2023, 9:31 AM

## 2023-11-06 NOTE — Telephone Encounter (Signed)
 Cleared for colonoscopy - hold Xarelto  2 days prior to procedure. Restart when safe after.  Dr Marvina Slough

## 2023-11-18 ENCOUNTER — Other Ambulatory Visit: Payer: Self-pay | Admitting: Internal Medicine

## 2023-11-18 DIAGNOSIS — I480 Paroxysmal atrial fibrillation: Secondary | ICD-10-CM

## 2023-11-18 NOTE — Telephone Encounter (Signed)
 Xarelto  20mg  refill request received. Pt is 68 years old, weight-77.7kg, Crea- 0.67 on 08/05/23 via [employer], last seen by Dr. Maximo Spar on 11/05/23, Diagnosis-Afib, CrCl- 112.75 mL/min; Dose is appropriate based on dosing criteria. Will send in refill to requested pharmacy.

## 2023-11-20 LAB — BASIC METABOLIC PANEL WITH GFR
BUN/Creatinine Ratio: 25 — ABNORMAL HIGH (ref 10–24)
BUN: 18 mg/dL (ref 8–27)
CO2: 22 mmol/L (ref 20–29)
Calcium: 9.6 mg/dL (ref 8.6–10.2)
Chloride: 100 mmol/L (ref 96–106)
Creatinine, Ser: 0.73 mg/dL — ABNORMAL LOW (ref 0.76–1.27)
Glucose: 116 mg/dL — ABNORMAL HIGH (ref 70–99)
Potassium: 4.8 mmol/L (ref 3.5–5.2)
Sodium: 137 mmol/L (ref 134–144)
eGFR: 98 mL/min/{1.73_m2} (ref >59)

## 2023-11-21 ENCOUNTER — Ambulatory Visit (HOSPITAL_BASED_OUTPATIENT_CLINIC_OR_DEPARTMENT_OTHER): Payer: Self-pay | Admitting: Internal Medicine

## 2023-12-17 ENCOUNTER — Other Ambulatory Visit (HOSPITAL_COMMUNITY): Payer: Self-pay

## 2024-03-22 ENCOUNTER — Ambulatory Visit (INDEPENDENT_AMBULATORY_CARE_PROVIDER_SITE_OTHER)
Admission: RE | Admit: 2024-03-22 | Discharge: 2024-03-22 | Disposition: A | Discharge status: Home / Self Care | Source: Ambulatory Visit | Attending: Family Medicine | Admitting: Family Medicine

## 2024-03-22 ENCOUNTER — Other Ambulatory Visit (HOSPITAL_BASED_OUTPATIENT_CLINIC_OR_DEPARTMENT_OTHER): Payer: Self-pay | Admitting: Family Medicine

## 2024-03-22 DIAGNOSIS — R062 Wheezing: Secondary | ICD-10-CM

## 2024-03-22 DIAGNOSIS — J189 Pneumonia, unspecified organism: Secondary | ICD-10-CM

## 2024-03-25 ENCOUNTER — Emergency Department (HOSPITAL_COMMUNITY)
Admission: EM | Admit: 2024-03-25 | Discharge: 2024-03-25 | Disposition: A | Discharge status: Home / Self Care | Attending: Emergency Medicine | Admitting: Emergency Medicine

## 2024-03-25 ENCOUNTER — Encounter (HOSPITAL_COMMUNITY): Payer: Self-pay

## 2024-03-25 ENCOUNTER — Other Ambulatory Visit: Payer: Self-pay

## 2024-03-25 ENCOUNTER — Emergency Department (HOSPITAL_COMMUNITY)

## 2024-03-25 DIAGNOSIS — Z7901 Long term (current) use of anticoagulants: Secondary | ICD-10-CM | POA: Diagnosis not present

## 2024-03-25 DIAGNOSIS — J4489 Other specified chronic obstructive pulmonary disease: Secondary | ICD-10-CM | POA: Diagnosis not present

## 2024-03-25 DIAGNOSIS — I2510 Atherosclerotic heart disease of native coronary artery without angina pectoris: Secondary | ICD-10-CM | POA: Insufficient documentation

## 2024-03-25 DIAGNOSIS — I959 Hypotension, unspecified: Secondary | ICD-10-CM | POA: Diagnosis present

## 2024-03-25 DIAGNOSIS — I509 Heart failure, unspecified: Secondary | ICD-10-CM | POA: Diagnosis not present

## 2024-03-25 DIAGNOSIS — R0602 Shortness of breath: Secondary | ICD-10-CM | POA: Insufficient documentation

## 2024-03-25 DIAGNOSIS — F17210 Nicotine dependence, cigarettes, uncomplicated: Secondary | ICD-10-CM | POA: Diagnosis not present

## 2024-03-25 DIAGNOSIS — Z79899 Other long term (current) drug therapy: Secondary | ICD-10-CM | POA: Diagnosis not present

## 2024-03-25 DIAGNOSIS — I110 Hypertensive heart disease with heart failure: Secondary | ICD-10-CM | POA: Diagnosis not present

## 2024-03-25 LAB — CBC WITH DIFFERENTIAL/PLATELET
Abs Immature Granulocytes: 0.02 K/uL (ref 0.00–0.07)
Basophils Absolute: 0 K/uL (ref 0.0–0.1)
Basophils Relative: 0 %
Eosinophils Absolute: 0 K/uL (ref 0.0–0.5)
Eosinophils Relative: 0 %
HCT: 43.1 % (ref 39.0–52.0)
Hemoglobin: 14.2 g/dL (ref 13.0–17.0)
Immature Granulocytes: 0 %
Lymphocytes Relative: 8 %
Lymphs Abs: 0.7 K/uL (ref 0.7–4.0)
MCH: 33 pg (ref 26.0–34.0)
MCHC: 32.9 g/dL (ref 30.0–36.0)
MCV: 100.2 fL — ABNORMAL HIGH (ref 80.0–100.0)
Monocytes Absolute: 0.4 K/uL (ref 0.1–1.0)
Monocytes Relative: 4 %
Neutro Abs: 7.6 K/uL (ref 1.7–7.7)
Neutrophils Relative %: 88 %
Platelets: 162 K/uL (ref 150–400)
RBC: 4.3 MIL/uL (ref 4.22–5.81)
RDW: 13.2 % (ref 11.5–15.5)
WBC: 8.7 K/uL (ref 4.0–10.5)
nRBC: 0 % (ref 0.0–0.2)

## 2024-03-25 LAB — BASIC METABOLIC PANEL WITH GFR
Anion gap: 12 (ref 5–15)
BUN: 21 mg/dL (ref 8–23)
CO2: 21 mmol/L — ABNORMAL LOW (ref 22–32)
Calcium: 9.2 mg/dL (ref 8.9–10.3)
Chloride: 106 mmol/L (ref 98–111)
Creatinine, Ser: 0.63 mg/dL (ref 0.61–1.24)
GFR, Estimated: >60 mL/min (ref >60)
Glucose, Bld: 128 mg/dL — ABNORMAL HIGH (ref 70–99)
Potassium: 4.2 mmol/L (ref 3.5–5.1)
Sodium: 139 mmol/L (ref 135–145)

## 2024-03-25 LAB — I-STAT CHEM 8, ED
BUN: 22 mg/dL (ref 8–23)
Calcium, Ion: 1.2 mmol/L (ref 1.15–1.40)
Chloride: 107 mmol/L (ref 98–111)
Creatinine, Ser: 0.6 mg/dL — ABNORMAL LOW (ref 0.61–1.24)
Glucose, Bld: 132 mg/dL — ABNORMAL HIGH (ref 70–99)
HCT: 42 % (ref 39.0–52.0)
Hemoglobin: 14.3 g/dL (ref 13.0–17.0)
Potassium: 4.1 mmol/L (ref 3.5–5.1)
Sodium: 139 mmol/L (ref 135–145)
TCO2: 20 mmol/L — ABNORMAL LOW (ref 22–32)

## 2024-03-25 LAB — BRAIN NATRIURETIC PEPTIDE: B Natriuretic Peptide: 1332.6 pg/mL — ABNORMAL HIGH (ref 0.0–100.0)

## 2024-03-25 LAB — TROPONIN I (HIGH SENSITIVITY)
Troponin I (High Sensitivity): 85 ng/L — ABNORMAL HIGH (ref <18)
Troponin I (High Sensitivity): 89 ng/L — ABNORMAL HIGH (ref <18)

## 2024-03-25 MED ORDER — SODIUM CHLORIDE 0.9 % IV BOLUS
500.0000 mL | Freq: Once | INTRAVENOUS | Status: AC
  Administered 2024-03-25: 500 mL via INTRAVENOUS

## 2024-03-25 NOTE — Discharge Instructions (Signed)
 Please return for any worsening symptoms especially worsening difficulty breathing or if you are experiencing lightheaded or dizziness.  Eat and drink as well as you can for the next few days.  Please follow-up with your doctor in clinic.

## 2024-03-25 NOTE — ED Notes (Signed)
 Pt alert, NAD, calm, interactive, resps e/u, speaking in clear complete sentences. VSS/ improved. Family at [facility]. EDP at [facility].

## 2024-03-25 NOTE — ED Triage Notes (Signed)
 Patient has been sick over the last few days and was prescribed levoquin but went for a recheck and was found to by hypotensive. Patient has had cough congestion chest tightness and leg swelling.  Patient denies pain at this time but at one point talked about blood tinged sputum and patient is on xarelto .  Patient has hx HF AFib

## 2024-03-25 NOTE — ED Provider Notes (Signed)
 [place] EMERGENCY DEPARTMENT AT [facility] Provider Note  CSN: [id number] Arrival date & time: 03/25/24 1256  Chief Complaint(s) Hypotension  HPI [name] is a 68 y.o. male history of coronary artery disease, CHF, COPD presenting to the emergency department with low blood pressure.  Patient reports about a week ago he developed cough, shortness of breath, congestion.  Was treated by primary doctor including receiving Levaquin, reports his symptoms have overall improved.  Still has some residual cough.  Saw his primary doctor today and his blood pressure was 90/60 so they sent him to the ER.  Denies any ongoing fevers or chills, leg swelling.   Past Medical History Past Medical History:  Diagnosis Date   Asthma    CAD (coronary artery disease) 01/23/2016   minor CAD at cath-40% RCA   CHF (congestive heart failure) (HCC)    aortic valve replacement   COPD (chronic obstructive pulmonary disease) (HCC)    denies patient said that a pulmonologist said he said that he didnt have COPD   Dyspnea    Dysrhythmia    hx afib   Heart murmur    had a murmur before the aortic valve replacement, no murmur according to dr. 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/2016   Essential hypertension 10/03/2016   Claudication in peripheral vascular disease 08/28/2016   Typical atrial flutter (HCC) 02/20/2016   S/P AVR (aortic valve replacement) 02/20/2016   Chronic systolic (congestive) heart failure (HCC) 02/20/2016   Long term current use of anticoagulant therapy 02/07/2016   Atrial fibrillation (HCC) 02/02/2016   CAD- minor CAD at cath  01/24/2016   BPH (benign prostatic hyperplasia)  01/24/2016   COPD (chronic obstructive pulmonary disease) (HCC)    Non-ischemic cardiomyopathy (HCC) 01/19/2016   Shortness of breath 01/19/2016   Other fatigue 01/19/2016   Screening for lipid disorders 01/19/2016   MR (mitral regurgitation) 01/19/2016   Home Medication(s) Prior to Admission medications   Medication Sig Start Date End Date Taking? Authorizing Provider  acetaminophen  (TYLENOL ) 500 MG tablet Take 1,000 mg by mouth daily as needed for headache.   Yes [provider]  albuterol  (VENTOLIN  HFA) 108 (90 Base) MCG/ACT inhaler Inhale 2 puffs into the lungs every 4 (four) hours as needed for wheezing or shortness of breath. 03/22/24  Yes [provider]  atorvastatin  (LIPITOR) 10 MG tablet TAKE 1 TABLET BY MOUTH EVERY DAY 08/17/21  Yes Hilty, Vinie BROCKS, MD  carvedilol  (COREG ) 3.125 MG tablet TAKE 1 TABLET BY MOUTH TWICE A DAY WITH MEALS 09/18/21  Yes Hilty, Vinie BROCKS, MD  empagliflozin  (JARDIANCE ) 10 MG TABS tablet Take 1 tablet (10 mg total) by mouth daily before breakfast. 11/05/23  Yes Hilty, Vinie BROCKS, MD  empagliflozin  (JARDIANCE ) 10 MG TABS tablet Take 1 tablet (10 mg total) by mouth daily before breakfast. 11/05/23  Yes Hilty, Vinie BROCKS, MD  finasteride  (PROSCAR ) 5 MG tablet Take 1 tablet (5 mg total) by mouth daily. Patient taking differently: Take 5 mg by mouth every evening. 02/20/16  Yes Hilty, Vinie BROCKS, MD  levofloxacin (LEVAQUIN) 500 MG tablet Take 500 mg by mouth daily. 03/22/24  Yes [provider]  losartan  (COZAAR ) 100 MG tablet Take 1 tablet by mouth once daily 12/25/21  Yes Hilty, Vinie BROCKS, MD  naproxen sodium (ALEVE) 220 MG tablet Take 440 mg by mouth daily as needed (pain).   Yes [provider]  predniSONE (STERAPRED UNI-PAK 21 TAB) 10 MG (21) TBPK tablet Take 10 mg by mouth See admin instructions. Per dose pack. 03/22/24 03/27/24 Yes [provider]  rivaroxaban  (XARELTO ) 20 MG TABS tablet Take 1 tablet by mouth once  daily Patient taking differently: Take 20 mg by mouth daily with supper. 11/18/23  Yes Hilty, Vinie BROCKS, MD  tamsulosin  (FLOMAX ) 0.4 MG CAPS capsule Take 0.4 mg by mouth every evening. 01/03/16  Yes [provider]                                                                                                                                    Past Surgical History Past Surgical History:  Procedure Laterality Date   ABDOMINAL AORTOGRAM W/LOWER EXTREMITY N/A 10/16/2016   Procedure: Abdominal Aortogram w/Lower Extremity;  Surgeon: Deatrice DELENA Cage, MD;  Location: [facility] INVASIVE CV LAB;  Service: Cardiovascular;  Laterality: N/A;   AORTIC VALVE REPLACEMENT N/A 01/29/2016   Procedure: AORTIC VALVE REPLACEMENT (AVR) with Magna Ease size 25mm;  Surgeon: Maude Fleeta Ochoa, MD;  Location: [facility] OR;  Service: Open Heart Surgery;  Laterality: N/A;   CARDIAC CATHETERIZATION N/A 01/23/2016   Procedure: Right/Left Heart Cath and Coronary Angiography;  Surgeon: Vinie BROCKS Maxcy, MD;  Location: [facility] INVASIVE CV LAB;  Service: Cardiovascular;  Laterality: N/A;   CARDIOVERSION N/A 03/26/2016   Procedure: CARDIOVERSION;  Surgeon: Vinie BROCKS Maxcy, MD;  Location: [facility] ENDOSCOPY;  Service: Cardiovascular;  Laterality: N/A;   CATARACT EXTRACTION  12/2002 & 09/2007   ENDARTERECTOMY FEMORAL Left 11/01/2016   Procedure: ENDARTERECTOMY FEMORAL;  Surgeon: Serene Gaile ORN, MD;  Location: [facility] OR;  Service: Vascular;  Laterality: Left;   PATCH ANGIOPLASTY Left 11/01/2016   Procedure: PATCH ANGIOPLASTY;  Surgeon: Serene Gaile ORN, MD;  Location: [facility] OR;  Service: Vascular;  Laterality: Left;   SHOULDER SURGERY Right 1971   TEE WITHOUT CARDIOVERSION N/A 01/29/2016   Procedure: TRANSESOPHAGEAL ECHOCARDIOGRAM (TEE);  Surgeon: Maude Fleeta Ochoa, MD;  Location: [facility] OR;  Service: Open Heart Surgery;  Laterality: N/A;   Family History Family History  Problem Relation Age of Onset   Breast cancer Mother    Breast cancer Sister     Social  History Social History   Tobacco Use   Smoking status: Former    Current packs/day: 0.00    Average packs/day: 1 pack/day for 45.0 years (45.0 ttl pk-yrs)    Types: Cigarettes    Start date: 01/23/1971    Quit date: 01/23/2016    Years since quitting: 8.1   Smokeless tobacco: Former  Advertising account planner   Vaping status: Former   Quit date: 08/28/2015  Substance Use Topics  Alcohol use: Yes    Alcohol/week: 12.0 standard drinks of alcohol    Types: 12 Standard drinks or equivalent per week    Comment: beer   Drug use: No   Allergies No known allergies  Review of Systems Review of Systems  All other systems reviewed and are negative.   Physical Exam Vital Signs  I have reviewed the triage vital signs BP 107/68   Pulse 60   Temp 97.9 F (36.6 C)   Resp 18   Ht 5' 10 (1.778 m)   Wt 77.6 kg   SpO2 95%   BMI 24.54 kg/m  Physical Exam Vitals and nursing note reviewed.  Constitutional:      General: He is not in acute distress.    Appearance: Normal appearance.  HENT:     Mouth/Throat:     Mouth: Mucous membranes are moist.  Eyes:     Conjunctiva/sclera: Conjunctivae normal.  Cardiovascular:     Rate and Rhythm: Normal rate and regular rhythm.  Pulmonary:     Effort: Pulmonary effort is normal. No respiratory distress.     Breath sounds: Normal breath sounds.  Abdominal:     General: Abdomen is flat.     Palpations: Abdomen is soft.     Tenderness: There is no abdominal tenderness.  Musculoskeletal:     Right lower leg: No edema.     Left lower leg: No edema.  Skin:    General: Skin is warm and dry.     Capillary Refill: Capillary refill takes less than 2 seconds.  Neurological:     Mental Status: He is alert and oriented to person, place, and time. Mental status is at baseline.  Psychiatric:        Mood and Affect: Mood normal.        Behavior: Behavior normal.     ED Results and Treatments Labs (all labs ordered are listed, but only abnormal results are  displayed) Labs Reviewed  BASIC METABOLIC PANEL WITH GFR - Abnormal; Notable for the following components:      Result Value   CO2 21 (*)    Glucose, Bld 128 (*)    All other components within normal limits  CBC WITH DIFFERENTIAL/PLATELET - Abnormal; Notable for the following components:   MCV 100.2 (*)    All other components within normal limits  BRAIN NATRIURETIC PEPTIDE - Abnormal; Notable for the following components:   B Natriuretic Peptide 1,332.6 (*)    All other components within normal limits  I-STAT CHEM 8, ED - Abnormal; Notable for the following components:   Creatinine, Ser 0.60 (*)    Glucose, Bld 132 (*)    TCO2 20 (*)    All other components within normal limits  TROPONIN I (HIGH SENSITIVITY) - Abnormal; Notable for the following components:   Troponin I (High Sensitivity) 85 (*)    All other components within normal limits  TROPONIN I (HIGH SENSITIVITY)  Radiology DG Chest 2 View Result Date: 03/25/2024 CLINICAL DATA:  Shortness of breath.  Cough. EXAM: CHEST - 2 VIEW COMPARISON:  03/22/2024. FINDINGS: Bilateral lungs appear hyperlucent with coarse bronchovascular markings, in keeping with COPD. There is superimposed mild-to-moderate diffuse pulmonary vascular congestion, improved since the prior study. No acute consolidation or lung collapse. Bilateral costophrenic angles are clear. Normal cardio-mediastinal silhouette. There are surgical staples along the heart border and sternotomy wires, status post CABG (coronary artery bypass graft). Prosthetic aortic valve noted. No acute osseous abnormalities. The soft tissues are within normal limits. IMPRESSION: Mild-to-moderate diffuse pulmonary vascular congestion, improved since the prior study. No acute consolidation or lung collapse. Electronically Signed   By: Ree Molt M.D.   On: 03/25/2024 14:00     Pertinent labs & imaging results that were available during my care of the patient were reviewed by me and considered in my medical decision making (see MDM for details).  Medications Ordered in ED Medications  sodium chloride  0.9 % bolus 500 mL (0 mLs Intravenous Stopped 03/25/24 1418)                                                                                                                                     Procedures Procedures  (including critical care time)  Medical Decision Making / ED Course   MDM:  68 year old presenting to the emergency department with low blood pressure.  Patient overall well-appearing, physical examination nonfocal.  Appears grossly euvolemic.  Unclear cause of the low blood pressure, could be very mildly volume down.  Received small fluid bolus.  Overall his symptoms have improved after being on course of Levaquin.  Suspect patient may have had a pneumonia.  He denies any chest pain or shortness of breath at this time.  Unclear why troponin was sent in triage but is elevated.  He is not having chest pain.  Given history of CHF suspect that is chronically elevated due to his underlying CHF but will check a delta troponin.  BNP is also ordered and was elevated although patient appears to euvolemic, doubt this is clinically relevant today.  Labs without evidence of AKI to suggest cardiorenal syndrome or dehydration.  Overall, given patient well appearance and limited symptoms, feel that he should be stable for discharge and outpatient follow-up if repeat troponin is stable.  Signed out to oncoming provider Dr. Emil pending repeat troponin and re-assessment       Additional history obtained: -Additional history obtained from family -External records from outside source obtained and reviewed including: Chart review including previous notes, labs, imaging, consultation notes including prior notes    Lab Tests: -I ordered, reviewed, and interpreted  labs.   The pertinent results include:   Labs Reviewed  BASIC METABOLIC PANEL WITH GFR - Abnormal; Notable for the following components:      Result Value   CO2 21 (*)    Glucose, Bld 128 (*)  All other components within normal limits  CBC WITH DIFFERENTIAL/PLATELET - Abnormal; Notable for the following components:   MCV 100.2 (*)    All other components within normal limits  BRAIN NATRIURETIC PEPTIDE - Abnormal; Notable for the following components:   B Natriuretic Peptide 1,332.6 (*)    All other components within normal limits  I-STAT CHEM 8, ED - Abnormal; Notable for the following components:   Creatinine, Ser 0.60 (*)    Glucose, Bld 132 (*)    TCO2 20 (*)    All other components within normal limits  TROPONIN I (HIGH SENSITIVITY) - Abnormal; Notable for the following components:   Troponin I (High Sensitivity) 85 (*)    All other components within normal limits  TROPONIN I (HIGH SENSITIVITY)    Notable for elevated BNP, elevated troponin  EKG   EKG Interpretation Date/Time:  Thursday March 25 2024 13:12:37 EDT Ventricular Rate:  69 PR Interval:  168 QRS Duration:  116 QT Interval:  386 QTC Calculation: 413 R Axis:   102  Text Interpretation: Normal sinus rhythm with sinus arrhythmia Rightward axis Left ventricular hypertrophy with QRS widening and repolarization abnormality ( Cornell product ) Anteroseptal infarct , age undetermined Abnormal ECG Confirmed by Francesca Fallow ([id number]) on 03/25/2024 1:59:09 PM         Imaging Studies ordered: I ordered imaging studies including CXR On my interpretation imaging demonstrates no pneumonia  I independently visualized and interpreted imaging. I agree with the radiologist interpretation   Medicines ordered and prescription drug management: Meds ordered this encounter  Medications   sodium chloride  0.9 % bolus 500 mL    -I have reviewed the patients home medicines and have made adjustments as needed  Social  Determinants of Health:  Diagnosis or treatment significantly limited by social determinants of health: former smoker   Reevaluation: After the interventions noted above, I reevaluated the patient and found that their symptoms have improved  Co morbidities that complicate the patient evaluation  Past Medical History:  Diagnosis Date   Asthma    CAD (coronary artery disease) 01/23/2016   minor CAD at cath-40% RCA   CHF (congestive heart failure) (HCC)    aortic valve replacement   COPD (chronic obstructive pulmonary disease) (HCC)    denies patient said that a pulmonologist said he said that he didnt have COPD   Dyspnea    Dysrhythmia    hx afib   Heart murmur    had a murmur before the aortic valve replacement, no murmur according to dr. Mona per patient   Hypertension    Non-ischemic cardiomyopathy (HCC) 01/23/2016   EF 30-35%   Pneumonia    Severe aortic stenosis    SOBOE (shortness of breath on exertion)       Dispostion: Disposition decision including need for hospitalization was considered, and patient discharged from emergency department.    Final Clinical Impression(s) / ED Diagnoses Final diagnoses:  Hypotension, unspecified hypotension type     This chart was dictated using voice recognition software.  Despite best efforts to proofread,  errors can occur which can change the documentation meaning.    Francesca Fallow CROME, MD 03/25/24 1630

## 2024-03-25 NOTE — ED Provider Notes (Signed)
 Received patient in turnover from Dr. Francesca.  Please see their note for further details of Hx, PE.  Briefly patient is a 68 y.o. male with a Hypotension .  Noted to have a soft blood pressure in his doctor's office today.  Sent here for evaluation.  Initial troponin mildly elevated plan for delta.  If unchanged or improving likely home with PCP follow-up.  No significant change on the patient's troponin.  I reassessed the patient.  No chest pain no difficulty breathing.  Feels much better than about 4 days ago when he became sick with pneumonia.  Patient and family would prefer to go home at this time.  PCP follow-up.    Emil Share, DO 03/25/24 [phone]

## 2024-04-19 ENCOUNTER — Emergency Department (HOSPITAL_COMMUNITY)

## 2024-04-19 ENCOUNTER — Inpatient Hospital Stay (HOSPITAL_COMMUNITY)
Admission: EM | Admit: 2024-04-19 | Discharge: 2024-05-04 | DRG: 266 | Disposition: A | Discharge status: Home Health | Attending: Cardiology | Admitting: Cardiology

## 2024-04-19 ENCOUNTER — Encounter (HOSPITAL_COMMUNITY): Payer: Self-pay

## 2024-04-19 ENCOUNTER — Other Ambulatory Visit: Payer: Self-pay

## 2024-04-19 DIAGNOSIS — Z803 Family history of malignant neoplasm of breast: Secondary | ICD-10-CM

## 2024-04-19 DIAGNOSIS — C3412 Malignant neoplasm of upper lobe, left bronchus or lung: Secondary | ICD-10-CM | POA: Diagnosis present

## 2024-04-19 DIAGNOSIS — J432 Centrilobular emphysema: Secondary | ICD-10-CM | POA: Diagnosis present

## 2024-04-19 DIAGNOSIS — Z79899 Other long term (current) drug therapy: Secondary | ICD-10-CM

## 2024-04-19 DIAGNOSIS — I480 Paroxysmal atrial fibrillation: Secondary | ICD-10-CM | POA: Diagnosis present

## 2024-04-19 DIAGNOSIS — I080 Rheumatic disorders of both mitral and aortic valves: Secondary | ICD-10-CM | POA: Diagnosis present

## 2024-04-19 DIAGNOSIS — Q2381 Bicuspid aortic valve: Secondary | ICD-10-CM

## 2024-04-19 DIAGNOSIS — Z23 Encounter for immunization: Secondary | ICD-10-CM

## 2024-04-19 DIAGNOSIS — I4729 Other ventricular tachycardia: Secondary | ICD-10-CM | POA: Diagnosis present

## 2024-04-19 DIAGNOSIS — I2510 Atherosclerotic heart disease of native coronary artery without angina pectoris: Secondary | ICD-10-CM | POA: Diagnosis not present

## 2024-04-19 DIAGNOSIS — I2489 Other forms of acute ischemic heart disease: Secondary | ICD-10-CM | POA: Diagnosis present

## 2024-04-19 DIAGNOSIS — J4489 Other specified chronic obstructive pulmonary disease: Secondary | ICD-10-CM | POA: Diagnosis present

## 2024-04-19 DIAGNOSIS — R570 Cardiogenic shock: Secondary | ICD-10-CM | POA: Diagnosis present

## 2024-04-19 DIAGNOSIS — Z8701 Personal history of pneumonia (recurrent): Secondary | ICD-10-CM

## 2024-04-19 DIAGNOSIS — I739 Peripheral vascular disease, unspecified: Secondary | ICD-10-CM | POA: Diagnosis present

## 2024-04-19 DIAGNOSIS — I272 Pulmonary hypertension, unspecified: Secondary | ICD-10-CM | POA: Diagnosis present

## 2024-04-19 DIAGNOSIS — I5023 Acute on chronic systolic (congestive) heart failure: Secondary | ICD-10-CM | POA: Diagnosis present

## 2024-04-19 DIAGNOSIS — I350 Nonrheumatic aortic (valve) stenosis: Secondary | ICD-10-CM | POA: Diagnosis present

## 2024-04-19 DIAGNOSIS — Y712 Prosthetic and other implants, materials and accessory cardiovascular devices associated with adverse incidents: Secondary | ICD-10-CM | POA: Diagnosis present

## 2024-04-19 DIAGNOSIS — Z7984 Long term (current) use of oral hypoglycemic drugs: Secondary | ICD-10-CM

## 2024-04-19 DIAGNOSIS — J449 Chronic obstructive pulmonary disease, unspecified: Secondary | ICD-10-CM | POA: Diagnosis not present

## 2024-04-19 DIAGNOSIS — Z8619 Personal history of other infectious and parasitic diseases: Secondary | ICD-10-CM

## 2024-04-19 DIAGNOSIS — Z634 Disappearance and death of family member: Secondary | ICD-10-CM

## 2024-04-19 DIAGNOSIS — I110 Hypertensive heart disease with heart failure: Secondary | ICD-10-CM | POA: Diagnosis present

## 2024-04-19 DIAGNOSIS — Z7982 Long term (current) use of aspirin: Secondary | ICD-10-CM

## 2024-04-19 DIAGNOSIS — Z1152 Encounter for screening for COVID-19: Secondary | ICD-10-CM | POA: Diagnosis not present

## 2024-04-19 DIAGNOSIS — I4891 Unspecified atrial fibrillation: Secondary | ICD-10-CM | POA: Diagnosis present

## 2024-04-19 DIAGNOSIS — J9601 Acute respiratory failure with hypoxia: Secondary | ICD-10-CM | POA: Diagnosis present

## 2024-04-19 DIAGNOSIS — N400 Enlarged prostate without lower urinary tract symptoms: Secondary | ICD-10-CM | POA: Diagnosis present

## 2024-04-19 DIAGNOSIS — Z9889 Other specified postprocedural states: Secondary | ICD-10-CM

## 2024-04-19 DIAGNOSIS — Z006 Encounter for examination for normal comparison and control in clinical research program: Secondary | ICD-10-CM

## 2024-04-19 DIAGNOSIS — I428 Other cardiomyopathies: Secondary | ICD-10-CM | POA: Diagnosis present

## 2024-04-19 DIAGNOSIS — I447 Left bundle-branch block, unspecified: Secondary | ICD-10-CM | POA: Diagnosis present

## 2024-04-19 DIAGNOSIS — G4733 Obstructive sleep apnea (adult) (pediatric): Secondary | ICD-10-CM | POA: Diagnosis present

## 2024-04-19 DIAGNOSIS — I5021 Acute systolic (congestive) heart failure: Secondary | ICD-10-CM

## 2024-04-19 DIAGNOSIS — Z9849 Cataract extraction status, unspecified eye: Secondary | ICD-10-CM

## 2024-04-19 DIAGNOSIS — Z7901 Long term (current) use of anticoagulants: Secondary | ICD-10-CM | POA: Diagnosis not present

## 2024-04-19 DIAGNOSIS — E785 Hyperlipidemia, unspecified: Secondary | ICD-10-CM | POA: Diagnosis present

## 2024-04-19 DIAGNOSIS — I10 Essential (primary) hypertension: Secondary | ICD-10-CM | POA: Diagnosis not present

## 2024-04-19 DIAGNOSIS — I5022 Chronic systolic (congestive) heart failure: Secondary | ICD-10-CM | POA: Diagnosis present

## 2024-04-19 DIAGNOSIS — I4892 Unspecified atrial flutter: Secondary | ICD-10-CM | POA: Diagnosis present

## 2024-04-19 DIAGNOSIS — J810 Acute pulmonary edema: Secondary | ICD-10-CM | POA: Diagnosis present

## 2024-04-19 DIAGNOSIS — I459 Conduction disorder, unspecified: Secondary | ICD-10-CM | POA: Diagnosis present

## 2024-04-19 DIAGNOSIS — Z602 Problems related to living alone: Secondary | ICD-10-CM | POA: Diagnosis present

## 2024-04-19 DIAGNOSIS — R911 Solitary pulmonary nodule: Secondary | ICD-10-CM | POA: Diagnosis present

## 2024-04-19 DIAGNOSIS — I5020 Unspecified systolic (congestive) heart failure: Secondary | ICD-10-CM | POA: Diagnosis not present

## 2024-04-19 DIAGNOSIS — R7989 Other specified abnormal findings of blood chemistry: Secondary | ICD-10-CM

## 2024-04-19 DIAGNOSIS — Z604 Social exclusion and rejection: Secondary | ICD-10-CM | POA: Diagnosis present

## 2024-04-19 DIAGNOSIS — F17210 Nicotine dependence, cigarettes, uncomplicated: Secondary | ICD-10-CM | POA: Diagnosis present

## 2024-04-19 DIAGNOSIS — T82857A Stenosis of cardiac prosthetic devices, implants and grafts, initial encounter: Secondary | ICD-10-CM | POA: Diagnosis present

## 2024-04-19 DIAGNOSIS — I509 Heart failure, unspecified: Principal | ICD-10-CM

## 2024-04-19 DIAGNOSIS — Z952 Presence of prosthetic heart valve: Secondary | ICD-10-CM | POA: Diagnosis not present

## 2024-04-19 DIAGNOSIS — T82897A Other specified complication of cardiac prosthetic devices, implants and grafts, initial encounter: Secondary | ICD-10-CM | POA: Diagnosis not present

## 2024-04-19 DIAGNOSIS — Z95820 Peripheral vascular angioplasty status with implants and grafts: Secondary | ICD-10-CM

## 2024-04-19 DIAGNOSIS — I493 Ventricular premature depolarization: Secondary | ICD-10-CM | POA: Diagnosis present

## 2024-04-19 DIAGNOSIS — Y831 Surgical operation with implant of artificial internal device as the cause of abnormal reaction of the patient, or of later complication, without mention of misadventure at the time of the procedure: Secondary | ICD-10-CM | POA: Diagnosis present

## 2024-04-19 HISTORY — DX: Unspecified systolic (congestive) heart failure: I50.20

## 2024-04-19 LAB — ECHOCARDIOGRAM COMPLETE
AR max vel: 0.6 cm2
AV Area VTI: 0.61 cm2
AV Area mean vel: 0.6 cm2
AV Mean grad: 49 mmHg
AV Peak grad: 88.9 mmHg
Ao pk vel: 4.72 m/s
Area-P 1/2: 5.02 cm2
Height: 70 in
S' Lateral: 5.3 cm
Weight: 2704 [oz_av]

## 2024-04-19 LAB — CBC
HCT: 44.1 % (ref 39.0–52.0)
Hemoglobin: 14.9 g/dL (ref 13.0–17.0)
MCH: 34 pg (ref 26.0–34.0)
MCHC: 33.8 g/dL (ref 30.0–36.0)
MCV: 100.7 fL — ABNORMAL HIGH (ref 80.0–100.0)
Platelets: 133 K/uL — ABNORMAL LOW (ref 150–400)
RBC: 4.38 MIL/uL (ref 4.22–5.81)
RDW: 14.3 % (ref 11.5–15.5)
WBC: 9.6 K/uL (ref 4.0–10.5)
nRBC: 0 % (ref 0.0–0.2)

## 2024-04-19 LAB — BASIC METABOLIC PANEL WITH GFR
Anion gap: 10 (ref 5–15)
BUN: 20 mg/dL (ref 8–23)
CO2: 23 mmol/L (ref 22–32)
Calcium: 9.1 mg/dL (ref 8.9–10.3)
Chloride: 105 mmol/L (ref 98–111)
Creatinine, Ser: 0.77 mg/dL (ref 0.61–1.24)
GFR, Estimated: >60 mL/min (ref >60)
Glucose, Bld: 104 mg/dL — ABNORMAL HIGH (ref 70–99)
Potassium: 4.4 mmol/L (ref 3.5–5.1)
Sodium: 138 mmol/L (ref 135–145)

## 2024-04-19 LAB — TROPONIN I (HIGH SENSITIVITY)
Troponin I (High Sensitivity): 218 ng/L (ref <18)
Troponin I (High Sensitivity): 259 ng/L (ref <18)
Troponin I (High Sensitivity): 268 ng/L (ref <18)

## 2024-04-19 LAB — HEPATIC FUNCTION PANEL
ALT: 18 U/L (ref 0–44)
AST: 19 U/L (ref 15–41)
Albumin: 3.3 g/dL — ABNORMAL LOW (ref 3.5–5.0)
Alkaline Phosphatase: 37 U/L — ABNORMAL LOW (ref 38–126)
Bilirubin, Direct: 0.2 mg/dL (ref 0.0–0.2)
Indirect Bilirubin: 0.8 mg/dL (ref 0.3–0.9)
Total Bilirubin: 1 mg/dL (ref 0.0–1.2)
Total Protein: 5.9 g/dL — ABNORMAL LOW (ref 6.5–8.1)

## 2024-04-19 LAB — MRSA NEXT GEN BY PCR, NASAL: MRSA by PCR Next Gen: NOT DETECTED

## 2024-04-19 LAB — COOXEMETRY PANEL
Carboxyhemoglobin: 3.8 % — ABNORMAL HIGH (ref 0.5–1.5)
Methemoglobin: <0.7 % (ref 0.0–1.5)
O2 Saturation: 66.9 %
Total hemoglobin: 15.1 g/dL (ref 12.0–16.0)

## 2024-04-19 LAB — LACTIC ACID, PLASMA: Lactic Acid, Venous: 1.4 mmol/L (ref 0.5–1.9)

## 2024-04-19 LAB — RESP PANEL BY RT-PCR (RSV, FLU A&B, COVID)  RVPGX2
Influenza A by PCR: NEGATIVE
Influenza B by PCR: NEGATIVE
Resp Syncytial Virus by PCR: NEGATIVE
SARS Coronavirus 2 by RT PCR: NEGATIVE

## 2024-04-19 LAB — BRAIN NATRIURETIC PEPTIDE: B Natriuretic Peptide: 1400.2 pg/mL — ABNORMAL HIGH (ref 0.0–100.0)

## 2024-04-19 MED ORDER — FUROSEMIDE 10 MG/ML IJ SOLN
40.0000 mg | Freq: Once | INTRAMUSCULAR | Status: AC
  Administered 2024-04-19: 40 mg via INTRAVENOUS
  Filled 2024-04-19: qty 4

## 2024-04-19 MED ORDER — ATORVASTATIN CALCIUM 10 MG PO TABS
10.0000 mg | ORAL_TABLET | Freq: Every day | ORAL | Status: DC
  Administered 2024-04-19 – 2024-05-04 (×16): 10 mg via ORAL
  Filled 2024-04-19 (×16): qty 1

## 2024-04-19 MED ORDER — ASPIRIN 81 MG PO TBEC
81.0000 mg | DELAYED_RELEASE_TABLET | Freq: Every day | ORAL | Status: DC
  Administered 2024-04-20 – 2024-04-28 (×9): 81 mg via ORAL
  Filled 2024-04-19 (×9): qty 1

## 2024-04-19 MED ORDER — ASPIRIN 325 MG PO TBEC
325.0000 mg | DELAYED_RELEASE_TABLET | Freq: Once | ORAL | Status: AC
Start: 2024-04-19 — End: 2024-04-19
  Administered 2024-04-19: 325 mg via ORAL
  Filled 2024-04-19: qty 1

## 2024-04-19 MED ORDER — ONDANSETRON HCL 4 MG/2ML IJ SOLN: 4.0000 mg | Freq: Four times a day (QID) | INTRAMUSCULAR | Status: DC | PRN

## 2024-04-19 MED ORDER — SODIUM CHLORIDE 0.9% FLUSH: 10.0000 mL | INTRAVENOUS | Status: DC | PRN

## 2024-04-19 MED ORDER — SODIUM CHLORIDE 0.9% FLUSH
10.0000 mL | Freq: Two times a day (BID) | INTRAVENOUS | Status: DC
  Administered 2024-04-19 – 2024-04-24 (×11): 10 mL
  Administered 2024-04-25: 20 mL
  Administered 2024-04-26 – 2024-04-29 (×7): 10 mL
  Administered 2024-04-30: 30 mL
  Administered 2024-04-30: 10 mL
  Administered 2024-05-01: 30 mL
  Administered 2024-05-02 – 2024-05-04 (×4): 10 mL

## 2024-04-19 MED ORDER — NOREPINEPHRINE 4 MG/250ML-% IV SOLN
0.0000 ug/min | INTRAVENOUS | Status: DC
  Administered 2024-04-19: 2 ug/min via INTRAVENOUS
  Filled 2024-04-19 (×2): qty 250

## 2024-04-19 MED ORDER — NOREPINEPHRINE 16 MG/250ML-% IV SOLN
0.0000 ug/min | INTRAVENOUS | Status: DC
  Administered 2024-04-19: 8 ug/min via INTRAVENOUS
  Administered 2024-04-20: 12 ug/min via INTRAVENOUS
  Administered 2024-04-22 – 2024-04-23 (×2): 8 ug/min via INTRAVENOUS
  Administered 2024-04-24: 7 ug/min via INTRAVENOUS
  Administered 2024-04-27: 11 ug/min via INTRAVENOUS
  Filled 2024-04-19 (×7): qty 250

## 2024-04-19 MED ORDER — DOBUTAMINE-DEXTROSE 4-5 MG/ML-% IV SOLN
2.5000 ug/kg/min | INTRAVENOUS | Status: DC
  Administered 2024-04-19: 2.5 ug/kg/min via INTRAVENOUS
  Filled 2024-04-19 (×2): qty 250

## 2024-04-19 MED ORDER — HEPARIN (PORCINE) 25000 UT/250ML-% IV SOLN
900.0000 [IU]/h | INTRAVENOUS | Status: DC
  Administered 2024-04-19: 900 [IU]/h via INTRAVENOUS
  Filled 2024-04-19: qty 250

## 2024-04-19 MED ORDER — SODIUM CHLORIDE 0.9 % IV SOLN
250.0000 mL | INTRAVENOUS | Status: AC
  Administered 2024-04-19: 250 mL via INTRAVENOUS

## 2024-04-19 MED ORDER — ENOXAPARIN SODIUM 40 MG/0.4ML IJ SOSY
40.0000 mg | PREFILLED_SYRINGE | INTRAMUSCULAR | Status: DC
  Administered 2024-04-19: 40 mg via SUBCUTANEOUS
  Filled 2024-04-19: qty 0.4

## 2024-04-19 MED ORDER — ACETAMINOPHEN 325 MG PO TABS
650.0000 mg | ORAL_TABLET | ORAL | Status: DC | PRN
  Administered 2024-04-20: 650 mg via ORAL
  Filled 2024-04-19: qty 2

## 2024-04-19 MED ORDER — NITROGLYCERIN 0.4 MG SL SUBL: 0.4000 mg | SUBLINGUAL_TABLET | SUBLINGUAL | Status: DC | PRN

## 2024-04-19 MED ORDER — CHLORHEXIDINE GLUCONATE CLOTH 2 % EX PADS
6.0000 | MEDICATED_PAD | Freq: Every day | CUTANEOUS | Status: DC
  Administered 2024-04-19 – 2024-05-04 (×15): 6 via TOPICAL

## 2024-04-19 MED ORDER — ATORVASTATIN CALCIUM 10 MG PO TABS: 10.0000 mg | ORAL_TABLET | Freq: Every day | ORAL | Status: DC

## 2024-04-19 NOTE — Progress Notes (Signed)
 Daughter Redgie) took watch home with her.

## 2024-04-19 NOTE — ED Provider Triage Note (Signed)
 Emergency Medicine Provider Triage Evaluation Note  ADONTE VANRIPER , a 68 y.o. male hx of chf and copd was evaluated in triage.  Pt complains of shortness of breath since 1st of September when he was diagnosed with the flu. Exertional but did have at rest last night. Has hx of aortic valve replacement as well.   Review of Systems  Positive: Night sweats Negative: Fever  Physical Exam  BP 97/70 (BP Location: Right Arm)   Pulse 86   Temp 97.7 F (36.5 C)   Resp 18   Ht 5' 10 (1.778 m)   Wt 76.7 kg   SpO2 96%   BMI 24.25 kg/m  Gen:   Awake, no distress   Resp:  Normal effort, rhonchi in RLL  MSK:   Moves extremities without difficulty  Other:    Medical Decision Making  Medically screening exam initiated at 11:55 AM.  Appropriate orders placed.  LARON BOORMAN was informed that the remainder of the evaluation will be completed by another provider, this initial triage assessment does not replace that evaluation, and the importance of remaining in the ED until their evaluation is complete.   Yolande Lamar BROCKS, MD 04/19/24 [phone]

## 2024-04-19 NOTE — ED Notes (Signed)
 pt o2 sat dropped to 88% with good waveform, I placed I'm on 2 lpm via [place]. Physicians notified via secure chat.

## 2024-04-19 NOTE — ED Triage Notes (Signed)
 Patient reports shob that hasn't gotten better since he was dx with the flu. Patient reports significant dyspnea with exertion, long recovery periods, needing to use inhaler, and fatigue. Patient is tachypnic in triage. Patient has hx of HF as well, reports swelling in legs improved but not shob.

## 2024-04-19 NOTE — Consult Note (Addendum)
 Cardiology Consultation   Patient ID: [name] MRN: [id number]; DOB: Mar 12, 1956  Admit date: 04/19/2024 Date of Consult: 04/19/2024  PCP:  Sabas Norleen PARAS., MD   [facility] Providers Cardiologist:  Vinie JAYSON Maxcy, MD   Patient Profile: [name] is a 68 y.o. 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 Garrick.  History of Present Illness: [name] has history of nonischemic cardiomyopathy with EF as low as 20-25% in 2017.  Cardiac catheterization at that time demonstrated nonobstructive disease.  Also noted to have bicuspid aortic valve. He subsequently underwent aortic valve replacement with 25 mm pericardial [facility] Ease valve 12/2015.    During recovery noted to have atrial fibrillation but eventually cardioverted without any documented recurrences.  Over the years EF has recovered but by most recent echocardiogram 09/2023 had dropped a little bit 45 to 50%.  He has also had progression of his aortic valve stenosis.  Echocardiogram had shown aortic stenosis with mean gradient 40, previously 33.  He was last seen 10/2023 and overall was doing well.  There was plans to refer him to structural heart disease for further evaluation.  More recently, he has also been having issues with hypotension.  Recently sent to the emergency room 9/25 for hypotension and flulike symptoms, sent in by his PCP.  Treated with Levaquin and noted to have persistent cough.  Troponins at that time mildly elevated,  he was discharged home.  Currently patient being evaluated for concerns of CHF exacerbation and elevated troponins.  BP have been low as low as 70/55.  He was given 1 dose of IV Lasix  40 mg.  EKG with worsening inferolateral T wave inversions.  Troponins 218-259  Patient today is accompanied with his daughter.  They report for the past 2 to 3 months that he has been having worsening dyspnea on exertion, fatigue, persistent cough.  Symptoms have been declining more rapidly within the past week.  Yesterday he had significant episode where he was very short of breath and was struggling to breathe for about an hour.  Has been reporting worsening orthopnea.  Denied any peripheral edema, weight gain.  Denied any chest pain.  Normally he is very independent and functional completing all of his ADLs and even mowing the grass independently but now is moving around with a scooter because of his dyspnea on exertion/fatigue.  Last dose of Eliquis last night.  BNP 1400+.  Chest x-ray with new bilateral small pleural effusions with mild interstitial edema and new bibasilar opacities.  Respiratory panel negative.  Potassium 4.4.  Creatinine 0.77.  Hemoglobin 14.9.  Past Medical History:  Diagnosis Date   Asthma    CAD (coronary artery disease) 01/23/2016   minor CAD at cath-40% RCA   CHF (congestive heart failure) (HCC)    aortic valve replacement   COPD (chronic obstructive pulmonary disease) (HCC)    denies patient said that a pulmonologist said he said that he didnt have COPD   Dyspnea    Dysrhythmia    hx afib   Heart murmur    had a murmur before  the aortic valve replacement, no murmur according to dr. Mona per patient   Hypertension    Non-ischemic cardiomyopathy (HCC) 01/23/2016   EF 30-35%   Pneumonia    Severe aortic stenosis    SOBOE (shortness of breath on exertion)     Past Surgical History:  Procedure Laterality Date   ABDOMINAL AORTOGRAM W/LOWER EXTREMITY N/A 10/16/2016   Procedure:  Abdominal Aortogram w/Lower Extremity;  Surgeon: Deatrice DELENA Cage, MD;  Location: [facility] INVASIVE CV LAB;  Service: Cardiovascular;  Laterality: N/A;   AORTIC VALVE REPLACEMENT N/A 01/29/2016   Procedure: AORTIC VALVE REPLACEMENT (AVR) with Magna Ease size 25mm;  Surgeon: Maude Fleeta Ochoa, MD;  Location: [facility] OR;  Service: Open Heart Surgery;  Laterality: N/A;   CARDIAC CATHETERIZATION N/A 01/23/2016   Procedure: Right/Left Heart Cath and Coronary Angiography;  Surgeon: Vinie JAYSON Mona, MD;  Location: [facility] INVASIVE CV LAB;  Service: Cardiovascular;  Laterality: N/A;   CARDIOVERSION N/A 03/26/2016   Procedure: CARDIOVERSION;  Surgeon: Vinie JAYSON Mona, MD;  Location: [facility] ENDOSCOPY;  Service: Cardiovascular;  Laterality: N/A;   CATARACT EXTRACTION  12/2002 & 09/2007   ENDARTERECTOMY FEMORAL Left 11/01/2016   Procedure: ENDARTERECTOMY FEMORAL;  Surgeon: Serene Gaile ORN, MD;  Location: [facility] OR;  Service: Vascular;  Laterality: Left;   PATCH ANGIOPLASTY Left 11/01/2016   Procedure: PATCH ANGIOPLASTY;  Surgeon: Serene Gaile ORN, MD;  Location: [facility] OR;  Service: Vascular;  Laterality: Left;   SHOULDER SURGERY Right 1971   TEE WITHOUT CARDIOVERSION N/A 01/29/2016   Procedure: TRANSESOPHAGEAL ECHOCARDIOGRAM (TEE);  Surgeon: Maude Fleeta Ochoa, MD;  Location: [facility] OR;  Service: Open Heart Surgery;  Laterality: N/A;     Scheduled Meds:  Continuous Infusions:  heparin  900 Units/hr (04/19/24 1707)   PRN Meds:   Allergies:    Allergies  Allergen Reactions   No Known Allergies     Social History:   Social History   Socioeconomic History   Marital status: Married    Spouse name: Not on file   Number of children: 4   Years of education: 10   Highest education level: Not on file  Occupational History   Occupation: Holiday representative    Comment: Engineer, mining  Tobacco Use   Smoking status: Former    Current packs/day: 0.00    Average packs/day: 1 pack/day for 45.0 years (45.0 ttl pk-yrs)    Types: Cigarettes     Start date: 01/23/1971    Quit date: 01/23/2016    Years since quitting: 8.2   Smokeless tobacco: Former  Advertising account planner   Vaping status: Former   Quit date: 08/28/2015  Substance and Sexual Activity   Alcohol use: Yes    Alcohol/week: 12.0 standard drinks of alcohol    Types: 12 Standard drinks or equivalent per week    Comment: beer   Drug use: No   Sexual activity: Not on file  Other Topics Concern   Not on file  Social History Narrative   Not on file   Social Drivers of Health   Financial Resource Strain: Not on file  Food Insecurity: Not on file  Transportation Needs: Not on file  Physical Activity: Not on file  Stress: Not on file  Social Connections: Not on file  Intimate Partner Violence: Not on file    Family History:   Family History  Problem Relation Age of Onset   Breast cancer Mother    Breast cancer Sister      ROS:  Please see the history of  present illness.  All other ROS reviewed and negative.     Physical Exam/Data: Vitals:   04/19/24 1354 04/19/24 1424 04/19/24 1618 04/19/24 1630  BP: (!) 85/60 (!) 84/57 (!) 76/64 (!) 78/59  Pulse: 76 93 94 79  Resp: 14  19 (!) 25  Temp: 98.5 F (36.9 C) 97.8 F (36.6 C) 98.1 F (36.7 C)   TempSrc: Oral Oral Oral   SpO2: 94% 97% 91% 92%  Weight:      Height:       No intake or output data in the 24 hours ending 04/19/24 1718    04/19/2024   11:20 AM 03/25/2024    1:06 PM 11/05/2023    8:40 AM  Last 3 Weights  Weight (lbs) 169 lb 171 lb 171 lb 6.4 oz  Weight (kg) 76.658 kg 77.565 kg 77.747 kg     Body mass index is 24.25 kg/m.  General:  Well nourished, well developed, in no acute distress HEENT: normal Neck: no JVD Vascular: No carotid bruits; Distal pulses 2+ bilaterally Cardiac:  normal S1, S2; RRR; 2 out of 6 murmur right sternal border Lungs: Cough, anterior rhonchi, posterior lungs sound clear. Abd: soft, nontender, no hepatomegaly  Ext: no edema Musculoskeletal:  No deformities, BUE and BLE  strength normal and equal Skin: warm and dry  Neuro:  CNs 2-12 intact, no focal abnormalities noted Psych:  Normal affect   EKG:  The EKG was personally reviewed and demonstrates: Sinus rhythm, heart rate 90.  New inferolateral T wave inversions.  Probable LVH.  Anteriorly ST elevation looks more pronounced. Telemetry:  Telemetry was personally reviewed and demonstrates: Not on telemetry.  Asked nurse to put him on.  Relevant CV Studies: Echocardiogram 10/22/2023 1. Left ventricular ejection fraction, by estimation, is 45 to 50%. Left  ventricular ejection fraction by 3D volume is 50 %. The left ventricle has  mildly decreased function. The left ventricle demonstrates regional wall  motion abnormalities (see  scoring diagram/findings for description). The left ventricular internal  cavity size was mildly dilated. There is mild concentric left ventricular  hypertrophy. Left ventricular diastolic parameters are consistent with  Grade II diastolic dysfunction  (pseudonormalization). Elevated left atrial pressure.   2. Right ventricular systolic function is normal. The right ventricular  size is normal. Tricuspid regurgitation signal is inadequate for assessing  PA pressure.   3. Left atrial size was moderately dilated.   4. Right atrial size was moderately dilated.   5. The mitral valve is normal in structure. Mild mitral valve  regurgitation.   6. The aortic valve has been repaired/replaced. Aortic valve  regurgitation is trivial. Severe aortic valve stenosis. Procedure Date:  01/29/2016. Aortic valve mean gradient measures 40.0 mmHg. Aortic valve  Vmax measures 4.19 m/s. Aortic valve  acceleration time measures 113 msec.   7. The inferior vena cava is normal in size with greater than 50%  respiratory variability, suggesting right atrial pressure of 3 mmHg.   Laboratory Data: High Sensitivity Troponin:   Recent Labs  Lab 03/25/24 1359 03/25/24 1550 04/19/24 1133 04/19/24 1354   TROPONINIHS 85* 89* 218* 259*     Chemistry Recent Labs  Lab 04/19/24 1133  NA 138  K 4.4  CL 105  CO2 23  GLUCOSE 104*  BUN 20  CREATININE 0.77  CALCIUM  9.1  GFRNONAA >60  ANIONGAP 10    No results for input(s): PROT, ALBUMIN , AST, ALT, ALKPHOS, BILITOT in the last 168 hours. Lipids No results for input(s): CHOL,  TRIG, HDL, LABVLDL, LDLCALC, CHOLHDL in the last 168 hours.  Hematology Recent Labs  Lab 04/19/24 1133  WBC 9.6  RBC 4.38  HGB 14.9  HCT 44.1  MCV 100.7*  MCH 34.0  MCHC 33.8  RDW 14.3  PLT 133*   Thyroid No results for input(s): TSH, FREET4 in the last 168 hours.  BNP Recent Labs  Lab 04/19/24 1133  BNP 1,400.2*    DDimer No results for input(s): DDIMER in the last 168 hours.  Radiology/Studies:  DG Chest 2 View Result Date: 04/19/2024 EXAM: 2 VIEW(S) XRAY OF THE CHEST 04/19/2024 12:14:00 PM COMPARISON: 08/26/2023 CLINICAL HISTORY: shob. SOB FINDINGS: LUNGS AND PLEURA: Chronic coarsened interstitial markings with superimposed increased interstitial markings identified. Bibasilar opacities are new from the previous exam and may reflect atelectasis or airspace disease. No pulmonary edema. No pleural effusion. No pneumothorax. HEART AND MEDIASTINUM: Status post median sternotomy. Prosthetic aortic valve. Aortic atherosclerotic calcification. BONES AND SOFT TISSUES: No acute osseous abnormality. IMPRESSION: 1. New bilateral small pleural effusions with mild interstitial edema concerning for chf. 2. New bibasilar opacities, possibly representing atelectasis or airspace disease. 3. Aortic atherosclerotic calcification. Electronically signed by: Waddell Calk MD 04/19/2024 01:38 PM EDT RP Workstation: [id number]     Assessment and Plan:  Acute HFmrEF NICM -LHC 2017 nonobstructive disease.  EF 20 to 25% -09/2023 EF 45 to 50% Presenting with persistent hypotension and worsening DOE/fatigue for the past 3 months with rapid decline  in the past week.  Current presentation concerning for low output heart failure.  Has narrow pulse pressures 76/64.  Bedside echo shows reduced EF with akinetic septal wall.  Also with new inferolateral T wave inversions.  He has known severe aortic stenosis which is also likely contributing.  Really does not have significant amount of volume, some pulmonary congestion.  Does feel warm on exam though. Getting stat lactic acid, stat echocardiogram. May need to consult advanced heart failure depending on lactic.  Likely will need right and left heart catheterization to further define aortic valve gradients and rule out ACS.  No beta-blocker with concerns of shock. Already got 1 dose of IV Lasix  40 mg with reportedly good urinary output.  Will reassess tomorrow diuretic need. Recommend getting CMP for tomorrow Hold losartan  100 mg.  Hold Jardiance  with hypotension.  Severe aortic stenosis History of bioprosthetic aortic valve with 12/2015 Bicuspid valve He has had recent increase in gradients.  Last echocardiogram demonstrating severe aortic stenosis with mean gradient 40, previously 33. Getting echocardiogram.  Probably needs right and left heart catheterization once he is more stable.  Right now cannot lay flat for the procedure.  Make n.p.o. at midnight just in case he's stable tomorrow.  Structural heart has been informed and will be reviewing his chart tomorrow.  NSTEMI Does not clearly exhibit anginal complaints but with reduction in EF and new inferolateral T wave inversions there is a concern.  Troponins are 218-259.  May represent demand ischemia but ACS needs to be ruled out and he has additional need for cardiac catheterization regardless. Will start IV heparin  for now, load on aspirin  324mg , continue statin 10 mg.  Cycle another set of troponins. Get lipid panel  Atrial fibrillation status post DCCV 03/2016 Noted postoperatively at the time of aortic valve replacement remotely.  No  documented recurrences.  In sinus rhythm here. Hold Xarelto , continue with IV heparin .  PAD History of left CFA endarterectomy and angioplasty.  Last duplex was 08/2018.  Supposed to get these yearly.  Needs to follow-up with Dr. Darron.  Tobacco abuse Cessation encouraged.   Risk Assessment/Risk Scores:  TIMI Risk Score for Unstable Angina or Non-ST Elevation MI:   The patient's TIMI risk score is 4, which indicates a 20% risk of all cause mortality, new or recurrent myocardial infarction or need for urgent revascularization in the next 14 days.{  New York  Heart Association (NYHA) Functional Class NYHA Class III  CHA2DS2-VASc Score = 4   This indicates a 4.8% annual risk of stroke. The patient's score is based upon: CHF History: 1 HTN History: 1 Diabetes History: 0 Stroke History: 0 Vascular Disease History: 1 Age Score: 1 Gender Score: 0    For questions or updates, please contact [facility] Please consult [email or web address] for contact info under   Signed, Thom LITTIE Sluder, PA-C  04/19/2024 5:18 PM

## 2024-04-19 NOTE — Progress Notes (Signed)
 Echocardiogram 2D Echocardiogram has been performed.  [name] 04/19/2024, 6:16 PM

## 2024-04-19 NOTE — Progress Notes (Signed)
 PHARMACY - ANTICOAGULATION CONSULT NOTE  Pharmacy Consult for heparin  Indication: chest pain/ACS  Allergies  Allergen Reactions   No Known Allergies    Patient Measurements: Height: 5' 10 (177.8 cm) Weight: 76.7 kg (169 lb) IBW/kg (Calculated) : 73 HEPARIN  DW (KG): 76.7  Vital Signs: Temp: 98.1 F (36.7 C) (10/20 1618) Temp Source: Oral (10/20 1618) BP: 78/59 (10/20 1630) Pulse Rate: 79 (10/20 1630)  Labs: Recent Labs    04/19/24 1133 04/19/24 1354  HGB 14.9  --   HCT 44.1  --   PLT 133*  --   CREATININE 0.77  --   TROPONINIHS 218* 259*   Estimated Creatinine Clearance: 88.7 mL/min (by C-G formula based on SCr of 0.77 mg/dL).  Medical History: Past Medical History:  Diagnosis Date   Asthma    CAD (coronary artery disease) 01/23/2016   minor CAD at cath-40% RCA   CHF (congestive heart failure) (HCC)    aortic valve replacement   COPD (chronic obstructive pulmonary disease) (HCC)    denies patient said that a pulmonologist said he said that he didnt have COPD   Dyspnea    Dysrhythmia    hx afib   Heart murmur    had a murmur before the aortic valve replacement, no murmur according to dr. Mona per patient   Hypertension    Non-ischemic cardiomyopathy (HCC) 01/23/2016   EF 30-35%   Pneumonia    Severe aortic stenosis    SOBOE (shortness of breath on exertion)    Active Medications:  Aspirin  325 mg po x 1 Furosemide  40mg  IV x1  Assessment: Patient is a 68 YO male presenting to the ED with shortness of breath, increased inhaler use needs, and fatigue. Troponins 218 > 259. Cardiology consulted for evaluation of CHF exacerbation and elevated troponins. Pharmacy consulted to dose heparin  for ACS.   Patient is on Xarelto  PTA for history of afib and bioprosthetic AVR. Patient states last dose was last night at 8pm. Hgb WNL; plt slightly low at 133. Will likely monitor with aPTT initially given recent DOAC administration.   Goal of Therapy:  INR 2-3 Heparin   level 0.3-0.7 units/ml Monitor platelets by anticoagulation protocol: Yes   Plan:  -Do not give bolus due to recent DOAC administration. -Start heparin  infusion at 900 units/h.  -Check initial aPTT, heparin  level in 8h.  -Monitor aPTT, heparin  level, CBC daily.   Maurilio Patten, PharmD PGY1 Pharmacy Resident [facility] 04/19/2024 4:52 PM

## 2024-04-19 NOTE — H&P (Signed)
 Cardiology H&P   Patient ID: [name] MRN: [id number]; DOB: Nov 11, 1955  Admit date: 04/19/2024 Date of Consult: 04/19/2024  PCP:  Sabas Norleen PARAS., MD   [facility] Providers Cardiologist:  Vinie JAYSON Maxcy, MD   Patient Profile: [name] is a 68 y.o. 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  History of Present Illness: [name] has history of nonischemic cardiomyopathy with EF as low as 20-25% in 2017.  Cardiac catheterization at that time demonstrated nonobstructive disease.  Also noted to have bicuspid aortic valve. He subsequently underwent aortic valve replacement with 25 mm pericardial [facility] Ease valve 12/2015.    During recovery noted to have atrial fibrillation but eventually cardioverted without any documented recurrences.  Over the years EF has recovered but by most recent echocardiogram 09/2023 had dropped a little bit 45 to 50%.  He has also had progression of his aortic valve stenosis.  Echocardiogram had shown aortic stenosis with mean gradient 40, previously 33.  He was last seen 10/2023 and overall was doing well.  There was plans to refer him to structural heart disease for further evaluation.  More recently, he has also been having issues with hypotension.  Recently sent to the emergency room 9/25 for hypotension and flulike symptoms, sent in by his PCP.  Treated with Levaquin and noted to have persistent cough.  Troponins at that time mildly elevated, he was discharged home.  Currently  patient being evaluated for concerns of CHF exacerbation and elevated troponins.  BP have been low as low as 70/55.  He was given 1 dose of IV Lasix  40 mg.  EKG with worsening inferolateral T wave inversions.  Troponins 218-259  Patient today is accompanied with his daughter.  They report for the past 2 to 3 months that he has been having worsening dyspnea on exertion, fatigue, persistent cough.  Symptoms have been declining more rapidly within the past week.  Yesterday he had significant episode where he was very short of breath and was struggling to breathe for about an hour.  Has been reporting worsening orthopnea.  Denied any peripheral edema, weight gain.  Denied any chest pain.  Normally he is very independent and functional completing all of his ADLs and even mowing the grass independently but now is moving around with a scooter because of his dyspnea on exertion/fatigue.  Last dose of Eliquis last night.  BNP 1400+.  Chest x-ray with new bilateral small pleural effusions with mild interstitial edema and new bibasilar opacities.  Respiratory panel negative.  Potassium 4.4.  Creatinine 0.77.  Hemoglobin 14.9.  Past Medical History:  Diagnosis Date   Asthma    CAD (coronary artery disease) 01/23/2016   minor CAD at cath-40% RCA   CHF (congestive heart failure) (HCC)    aortic valve replacement   COPD (chronic obstructive pulmonary disease) (HCC)    denies patient said that a pulmonologist said he said that he didnt have COPD   Dyspnea    Dysrhythmia    hx afib   Heart murmur    had a murmur before the aortic valve replacement, no murmur  according to dr. Mona per patient   Hypertension    Non-ischemic cardiomyopathy (HCC) 01/23/2016   EF 30-35%   Pneumonia    Severe aortic stenosis    SOBOE (shortness of breath on exertion)     Past Surgical History:  Procedure Laterality Date   ABDOMINAL AORTOGRAM W/LOWER EXTREMITY N/A 10/16/2016   Procedure: Abdominal Aortogram w/Lower Extremity;   Surgeon: Deatrice DELENA Cage, MD;  Location: [facility] INVASIVE CV LAB;  Service: Cardiovascular;  Laterality: N/A;   AORTIC VALVE REPLACEMENT N/A 01/29/2016   Procedure: AORTIC VALVE REPLACEMENT (AVR) with Magna Ease size 25mm;  Surgeon: Maude Fleeta Ochoa, MD;  Location: [facility] OR;  Service: Open Heart Surgery;  Laterality: N/A;   CARDIAC CATHETERIZATION N/A 01/23/2016   Procedure: Right/Left Heart Cath and Coronary Angiography;  Surgeon: Vinie JAYSON Mona, MD;  Location: [facility] INVASIVE CV LAB;  Service: Cardiovascular;  Laterality: N/A;   CARDIOVERSION N/A 03/26/2016   Procedure: CARDIOVERSION;  Surgeon: Vinie JAYSON Mona, MD;  Location: [facility] ENDOSCOPY;  Service: Cardiovascular;  Laterality: N/A;   CATARACT EXTRACTION  12/2002 & 09/2007   ENDARTERECTOMY FEMORAL Left 11/01/2016   Procedure: ENDARTERECTOMY FEMORAL;  Surgeon: Serene Gaile ORN, MD;  Location: [facility] OR;  Service: Vascular;  Laterality: Left;   PATCH ANGIOPLASTY Left 11/01/2016   Procedure: PATCH ANGIOPLASTY;  Surgeon: Serene Gaile ORN, MD;  Location: [facility] OR;  Service: Vascular;  Laterality: Left;   SHOULDER SURGERY Right 1971   TEE WITHOUT CARDIOVERSION N/A 01/29/2016   Procedure: TRANSESOPHAGEAL ECHOCARDIOGRAM (TEE);  Surgeon: Maude Fleeta Ochoa, MD;  Location: [facility] OR;  Service: Open Heart Surgery;  Laterality: N/A;     Scheduled Meds:  [START ON 04/20/2024] aspirin  EC  81 mg Oral Daily   Continuous Infusions:  heparin  900 Units/hr (04/19/24 1707)   PRN Meds: acetaminophen , nitroGLYCERIN , ondansetron  (ZOFRAN ) IV  Allergies:    Allergies  Allergen Reactions   No Known Allergies     Social History:   Social History   Socioeconomic History   Marital status: Married    Spouse name: Not on file   Number of children: 4   Years of education: 10   Highest education level: Not on file  Occupational History   Occupation: Holiday representative    Comment: Engineer, mining  Tobacco Use   Smoking status: Former    Current packs/day: 0.00    Average packs/day: 1  pack/day for 45.0 years (45.0 ttl pk-yrs)    Types: Cigarettes    Start date: 01/23/1971    Quit date: 01/23/2016    Years since quitting: 8.2   Smokeless tobacco: Former  Advertising account planner   Vaping status: Former   Quit date: 08/28/2015  Substance and Sexual Activity   Alcohol use: Yes    Alcohol/week: 12.0 standard drinks of alcohol    Types: 12 Standard drinks or equivalent per week    Comment: beer   Drug use: No   Sexual activity: Not on file  Other Topics Concern   Not on file  Social History Narrative   Not on file   Social Drivers of Health   Financial Resource Strain: Not on file  Food Insecurity: Not on file  Transportation Needs: Not on file  Physical Activity: Not on file  Stress: Not on file  Social Connections: Not on file  Intimate Partner Violence: Not on file    Family History:   Family History  Problem Relation Age of Onset   Breast cancer Mother    Breast cancer Sister  ROS:  Please see the history of present illness.  All other ROS reviewed and negative.     Physical Exam/Data: Vitals:   04/19/24 1745 04/19/24 1755 04/19/24 1845 04/19/24 1900  BP: (!) 70/53 (!) 75/56 (!) 76/54 (!) 74/59  Pulse: 71 75 67 70  Resp: (!) 21 (!) 27 (!) 23 (!) 22  Temp:      TempSrc:      SpO2: 92% 93% 91% 90%  Weight:      Height:       No intake or output data in the 24 hours ending 04/19/24 2006    04/19/2024   11:20 AM 03/25/2024    1:06 PM 11/05/2023    8:40 AM  Last 3 Weights  Weight (lbs) 169 lb 171 lb 171 lb 6.4 oz  Weight (kg) 76.658 kg 77.565 kg 77.747 kg     Body mass index is 24.25 kg/m.  General:  Well nourished, well developed, in no acute distress HEENT: normal Neck: no JVD Vascular: No carotid bruits; Distal pulses 2+ bilaterally Cardiac:  normal S1, S2; RRR; 2 out of 6 murmur right sternal border Lungs: Cough, anterior rhonchi, posterior lungs sound clear. Abd: soft, nontender, no hepatomegaly  Ext: no edema Musculoskeletal:  No  deformities, BUE and BLE strength normal and equal Skin: warm and dry  Neuro:  CNs 2-12 intact, no focal abnormalities noted Psych:  Normal affect   EKG:  The EKG was personally reviewed and demonstrates: Sinus rhythm, heart rate 90.  New inferolateral T wave inversions.  Probable LVH.  Anteriorly ST elevation looks more pronounced. Telemetry:  Telemetry was personally reviewed and demonstrates: Not on telemetry.  Asked nurse to put him on.  Relevant CV Studies: Echocardiogram 10/22/2023 1. Left ventricular ejection fraction, by estimation, is 45 to 50%. Left  ventricular ejection fraction by 3D volume is 50 %. The left ventricle has  mildly decreased function. The left ventricle demonstrates regional wall  motion abnormalities (see  scoring diagram/findings for description). The left ventricular internal  cavity size was mildly dilated. There is mild concentric left ventricular  hypertrophy. Left ventricular diastolic parameters are consistent with  Grade II diastolic dysfunction  (pseudonormalization). Elevated left atrial pressure.   2. Right ventricular systolic function is normal. The right ventricular  size is normal. Tricuspid regurgitation signal is inadequate for assessing  PA pressure.   3. Left atrial size was moderately dilated.   4. Right atrial size was moderately dilated.   5. The mitral valve is normal in structure. Mild mitral valve  regurgitation.   6. The aortic valve has been repaired/replaced. Aortic valve  regurgitation is trivial. Severe aortic valve stenosis. Procedure Date:  01/29/2016. Aortic valve mean gradient measures 40.0 mmHg. Aortic valve  Vmax measures 4.19 m/s. Aortic valve  acceleration time measures 113 msec.   7. The inferior vena cava is normal in size with greater than 50%  respiratory variability, suggesting right atrial pressure of 3 mmHg.   Laboratory Data: High Sensitivity Troponin:   Recent Labs  Lab 03/25/24 1359 03/25/24 1550  04/19/24 1133 04/19/24 1354 04/19/24 1811  TROPONINIHS 85* 89* 218* 259* 268*     Chemistry Recent Labs  Lab 04/19/24 1133  NA 138  K 4.4  CL 105  CO2 23  GLUCOSE 104*  BUN 20  CREATININE 0.77  CALCIUM  9.1  GFRNONAA >60  ANIONGAP 10    No results for input(s): PROT, ALBUMIN , AST, ALT, ALKPHOS, BILITOT in the last 168 hours. Lipids No  results for input(s): CHOL, TRIG, HDL, LABVLDL, LDLCALC, CHOLHDL in the last 168 hours.  Hematology Recent Labs  Lab 04/19/24 1133  WBC 9.6  RBC 4.38  HGB 14.9  HCT 44.1  MCV 100.7*  MCH 34.0  MCHC 33.8  RDW 14.3  PLT 133*   Thyroid No results for input(s): TSH, FREET4 in the last 168 hours.  BNP Recent Labs  Lab 04/19/24 1133  BNP 1,400.2*    DDimer No results for input(s): DDIMER in the last 168 hours.  Radiology/Studies:  US  EKG SITE RITE Result Date: 04/19/2024 If Site Rite image not attached, placement could not be confirmed due to current cardiac rhythm.  ECHOCARDIOGRAM COMPLETE Result Date: 04/19/2024    ECHOCARDIOGRAM REPORT   Patient Name:   [name] Date of Exam: 04/19/2024 Medical Rec #:  [id number]       Height:       70.0 in Accession #:    [id number]      Weight:       169.0 lb Date of Birth:  08/12/1955       BSA:          1.943 m Patient Age:    68 years        BP:           78/59 mmHg Patient Gender: M               HR:           71 bpm. Exam Location:  Inpatient Procedure: 2D Echo, Cardiac Doppler and Color Doppler (Both Spectral and Color            Flow Doppler were utilized during procedure). Indications:    CHF-Acute Systolic I50.21  History:        Patient has prior history of Echocardiogram examinations, most                 recent 10/22/2023. CHF and Cardiomyopathy, PAD and COPD,                 Arrythmias:Atrial Fibrillation and Atrial Flutter,                 Signs/Symptoms:Shortness of Breath; Risk Factors:Dyslipidemia.                 Aortic Valve: 25 mm Edwards valve is  present in the aortic                 position. Procedure Date: 2017.  Sonographer:    Thea Norlander RCS Referring Phys: [id number] Chemeka Filice L Floyde Dingley IMPRESSIONS  1. Left ventricular ejection fraction, by estimation, is 20 to 25%. The left ventricle has severely decreased function. The left ventricle demonstrates regional wall motion abnormalities (see scoring diagram/findings for description). The left ventricular internal cavity size was moderately to severely dilated. There is mild concentric left ventricular hypertrophy. Left ventricular diastolic parameters are indeterminate.  2. Right ventricular systolic function is normal. The right ventricular size is normal. Tricuspid regurgitation signal is inadequate for assessing PA pressure.  3. Left atrial size was severely dilated.  4. Right atrial size was mild to moderately dilated.  5. The mitral valve is degenerative. Moderate to severe mitral valve regurgitation. No evidence of mitral stenosis.  6. 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 Details below.  7. The aortic valve was not well visualized. Aortic valve regurgitation is mild to moderate. There is a 25 mm Edwards valve present in the aortic position. Procedure  Date: 2017. Echo findings are consistent with stenosis of the aortic prosthesis. Aortic  valve area, by VTI measures 0.61 cm. Aortic valve mean gradient measures 49.0 mmHg. Aortic valve Vmax measures 4.72 m/s. Aortic valve acceleration time measures 169 msec.  8. Aortic dilatation noted. There is borderline dilatation of the aortic root, measuring 37 mm.  9. The inferior vena cava is normal in size with greater than 50% respiratory variability, suggesting right atrial pressure of 3 mmHg. 10. Cannot exclude a small PFO. Comparison(s): A prior study was performed on 10/22/2023. The estimated ejection fraction was 45-50% with hypokinesis of inferior and posterior walls. There was grade II diastolic dysfunction with elevated LAP.  There was moderate biatrial enlargement. 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 FINDINGS  Left Ventricle: Left ventricular ejection fraction, by estimation, is 20 to 25%. The left ventricle has severely decreased function. The left ventricle demonstrates regional wall motion abnormalities. The left ventricular internal cavity size was moderately to severely dilated. There is mild concentric left ventricular hypertrophy. Left ventricular diastolic parameters are indeterminate.  LV Wall Scoring: The entire inferior wall and posterior wall are hypokinetic. Right Ventricle: The right ventricular size is normal. No increase in right ventricular wall thickness. Right ventricular systolic function is normal. Tricuspid regurgitation signal is inadequate for assessing PA pressure. Left Atrium: Left atrial size was severely dilated. Right Atrium: Right atrial size was mild to moderately dilated. Pericardium: There is no evidence of pericardial effusion. Mitral Valve: The mitral valve is degenerative in appearance. Mild mitral annular calcification. Moderate to severe mitral valve regurgitation. No evidence of mitral valve stenosis. Tricuspid Valve: The tricuspid valve is normal in structure. Tricuspid valve regurgitation is trivial. No evidence of tricuspid stenosis. Aortic Valve: The aortic valve was not well visualized. Aortic valve regurgitation is mild to moderate. Aortic valve mean gradient measures 49.0 mmHg. Aortic valve peak gradient measures 88.9 mmHg. Aortic valve area, by VTI measures 0.61 cm. There is a 25 mm Edwards valve present in the aortic position. Procedure Date: 2017. Pulmonic Valve: The pulmonic valve was normal in structure. Pulmonic valve regurgitation is trivial. No evidence of pulmonic stenosis. Aorta: Aortic dilatation noted. There is borderline dilatation of the aortic root, measuring 37 mm. Venous: The inferior vena cava is normal in  size with greater than 50% respiratory variability, suggesting right atrial pressure of 3 mmHg. IAS/Shunts: Cannot exclude a small PFO. 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 Details below.  LEFT VENTRICLE PLAX 2D LVIDd:         6.60 cm   Diastology LVIDs:         5.30 cm   LV e' medial:    4.03 cm/s LV PW:         1.10 cm   LV E/e' medial:  30.5 LV IVS:        1.10 cm   LV e' lateral:   9.14 cm/s LVOT diam:     2.30 cm   LV E/e' lateral: 13.5 LV SV:         68 LV SV Index:   35 LVOT Area:     4.15 cm  RIGHT VENTRICLE             IVC RV S prime:     11.70 cm/s  IVC diam: 1.90 cm TAPSE (M-mode): 2.2 cm LEFT ATRIUM  Index        RIGHT ATRIUM           Index LA diam:        4.10 cm  2.11 cm/m   RA Area:     21.40 cm LA Vol (A2C):   104.0 ml 53.52 ml/m  RA Volume:   62.70 ml  32.27 ml/m LA Vol (A4C):   106.0 ml 54.55 ml/m LA Biplane Vol: 106.0 ml 54.55 ml/m  AORTIC VALVE AV Area (Vmax):    0.60 cm AV Area (Vmean):   0.60 cm AV Area (VTI):     0.61 cm AV Vmax:           471.50 cm/s AV Vmean:          323.000 cm/s AV VTI:            1.115 m AV Peak Grad:      88.9 mmHg AV Mean Grad:      49.0 mmHg LVOT Vmax:         68.00 cm/s LVOT Vmean:        46.900 cm/s LVOT VTI:          0.164 m LVOT/AV VTI ratio: 0.15  AORTA Ao Root diam: 3.70 cm Ao Asc diam:  3.50 cm MITRAL VALVE MV Area (PHT): 5.02 cm     SHUNTS MV Decel Time: 151 msec     Systemic VTI:  0.16 m MV E velocity: 123.00 cm/s  Systemic Diam: 2.30 cm Emeline Calender Electronically signed by Emeline Calender Signature Date/Time: 04/19/2024/7:22:45 PM    Final    DG Chest 2 View Result Date: 04/19/2024 EXAM: 2 VIEW(S) XRAY OF THE CHEST 04/19/2024 12:14:00 PM COMPARISON: 08/26/2023 CLINICAL HISTORY: shob. SOB FINDINGS: LUNGS AND PLEURA: Chronic coarsened interstitial markings with superimposed increased interstitial markings identified. Bibasilar opacities are new from the previous exam and may reflect  atelectasis or airspace disease. No pulmonary edema. No pleural effusion. No pneumothorax. HEART AND MEDIASTINUM: Status post median sternotomy. Prosthetic aortic valve. Aortic atherosclerotic calcification. BONES AND SOFT TISSUES: No acute osseous abnormality. IMPRESSION: 1. New bilateral small pleural effusions with mild interstitial edema concerning for chf. 2. New bibasilar opacities, possibly representing atelectasis or airspace disease. 3. Aortic atherosclerotic calcification. Electronically signed by: Waddell Calk MD 04/19/2024 01:38 PM EDT RP Workstation: [id number]     Assessment and Plan:  Acute HFrEF NICM -LHC 2017 nonobstructive disease.  EF 20 to 25% -09/2023 EF 45 to 50% Presenting with persistent hypotension and worsening DOE/fatigue for the past 3 months with rapid decline in the past week.  Current presentation concerning for low output heart failure.  Has narrow pulse pressures 76/64.  Bedside echo shows reduced EF with akinetic septal wall.  Also with new inferolateral T wave inversions.  He has known severe aortic stenosis which is also likely contributing.  Really does not have significant amount of volume, some pulmonary congestion.  Does feel warm on exam though. Getting stat lactic acid, stat echocardiogram. May need to consult advanced heart failure depending on lactic.  Likely will need right and left heart catheterization to further define aortic valve gradients and rule out ACS.  No beta-blocker with concerns of shock. Already got 1 dose of IV Lasix  40 mg with reportedly good urinary output.  Will reassess tomorrow diuretic need. Recommend getting CMP for tomorrow Hold losartan  100 mg.  Hold Jardiance  with hypotension.  Severe aortic stenosis History of bioprosthetic aortic valve with 12/2015 Bicuspid valve He has had recent  increase in gradients.  Last echocardiogram demonstrating severe aortic stenosis with mean gradient 40, previously 33. Getting echocardiogram.   Probably needs right and left heart catheterization once he is more stable.  Right now cannot lay flat for the procedure.  Make n.p.o. at midnight just in case he's stable tomorrow.  Structural heart has been informed and will be reviewing his chart tomorrow.  NSTEMI Does not clearly exhibit anginal complaints but with reduction in EF and new inferolateral T wave inversions there is a concern.  Troponins are 218-259.  May represent demand ischemia but ACS needs to be ruled out and he has additional need for cardiac catheterization regardless. Will start IV heparin  for now, load on aspirin  324mg , continue statin 10 mg.  Cycle another set of troponins. Get lipid panel  Atrial fibrillation status post DCCV 03/2016 Noted postoperatively at the time of aortic valve replacement remotely.  No documented recurrences.  In sinus rhythm here. Hold Xarelto , continue with IV heparin .  PAD History of left CFA endarterectomy and angioplasty.  Last duplex was 08/2018.  Supposed to get these yearly.  Needs to follow-up with Dr. Darron.  Tobacco abuse Cessation encouraged.   Risk Assessment/Risk Scores:  TIMI Risk Score for Unstable Angina or Non-ST Elevation MI:   The patient's TIMI risk score is 4, which indicates a 20% risk of all cause mortality, new or recurrent myocardial infarction or need for urgent revascularization in the next 14 days.{  New [place]  Heart Association (NYHA) Functional Class NYHA Class III  CHA2DS2-VASc Score = 4   This indicates a 4.8% annual risk of stroke. The patient's score is based upon: CHF History: 1 HTN History: 1 Diabetes History: 0 Stroke History: 0 Vascular Disease History: 1 Age Score: 1 Gender Score: 0   Code Status: Full Code  Severity of Illness: The appropriate patient status for this patient is INPATIENT. Inpatient status is judged to be reasonable and necessary in order to provide the required intensity of service to ensure the patient's safety. The  patient's presenting symptoms, physical exam findings, and initial radiographic and laboratory data in the context of their chronic comorbidities is felt to place them at high risk for further clinical deterioration. Furthermore, it is not anticipated that the patient will be medically stable for discharge from the hospital within 2 midnights of admission.   * I certify that at the point of admission it is my clinical judgment that the patient will require inpatient hospital care spanning beyond 2 midnights from the point of admission due to high intensity of service, high risk for further deterioration and high frequency of surveillance required.*   For questions or updates, please contact  [facility] Please consult [email or web address] for contact info under   Signed, Thom LITTIE Sluder, PA-C  04/19/2024 8:06 PM

## 2024-04-19 NOTE — ED Provider Notes (Signed)
  EMERGENCY DEPARTMENT AT [facility] Provider Note  MDM   HPI/ROS:  [name] is a 68 y.o. male with a PMH of AS s/p bAVR, CAD, CHF, COPD presenting to the ED for shortness of breath.  Patient reports that he has had significant dyspnea with exertion, recovery, and is requiring use of his inhaler more than typical.  He reports that he was seen by his cardiologist, and told that his aortic valve repair was failing, and since then has noticed increase in swelling and shortness of breath.  DDx includes but is not limited to aortic valve decompensation, CHF exacerbation COPD exacerbation, ACS  On my initial evaluation, patient is:  -Vital signs significant for soft blood pressures, MAP still greater than 75, afebrile, tachypneic at a rate of 26 saturating 92% on RA. Patient afebrile and non-toxic appearing.  Physical exam is notable for: - Mild 2+ pitting edema to the bilateral lower extremities --Systolic heart murmur   BMP, CBC without medically relevant abnormalities, initial troponin of 218 with uptrending to 259 (baseline is 89).  BNP 1400, lactic acid 1.4.  Patient follows by Dr. Dilt-CD closely with cardiology, he recently had a repeat echo with his LVEF declined to 45 to 50% with inferior/inferoseptal wall motion abnormalities, with changes in his valve gradients.  On my bedside echo, EF looks more severely reduced, closer to 30%.  No significant pericardial effusion, no evidence of tamponade.  Given troponin elevation, concern for decompensated aortic valve stenosis versus heart failure, patient does not require admission for further workup.  Cardiology was consulted. Interpretations, interventions, and the patient's course of care are documented below.    Clinical Course as of 04/23/24 0631  Mon Apr 19, 2024  1450 Dr. Mona [SA]  1452   He just had a repeat echo.  His LVEF actually has declined somewhat now down to 45 to 50% with some inferior and inferoseptal  wall motion abnormalities.  The valve gradient has increased now to a mean gradient of 40 mmHg suggesting severe bioprosthetic aortic valve stenosis. [SA]  1538 S. 60M. Hx AV replacement. Here with decompensated CHF. Admitted to cards. [LB]  1847 Waiting to hear from cariology about admission [LB]    Clinical Course User Index [LB] Sharlet Dowdy, MD [SA] Billy Pal, MD     Disposition:  I discussed the case with cardiology who graciously agreed to admit the patient to their service for continued care.   Clinical Impression: No diagnosis found.  Rx / DC Order  Clinical Complexity A medically appropriate history, review of systems, and physical exam was performed.  My independent interpretations of EKG, labs, and radiology are documented in the ED course above.   If decision rules were used in this patient's evaluation, they are listed below.   Click here for ABCD2, HEART and other calculatorsREFRESH Note before signing   Patient's presentation is most consistent with acute presentation with potential threat to life or bodily function.  Medical Decision Making Amount and/or Complexity of Data Reviewed Labs: ordered. Radiology: ordered.  Risk Prescription drug management. Decision regarding hospitalization.    HPI/ROS      See MDM section for pertinent HPI and ROS. A complete ROS was performed with pertinent positives/negatives noted above.   Past Medical History:  Diagnosis Date   Asthma    CAD (coronary artery disease) 01/23/2016   minor CAD at cath-40% RCA   CHF (congestive heart failure) (HCC)    aortic valve replacement   COPD (chronic  obstructive pulmonary disease) (HCC)    denies patient said that a pulmonologist said he said that he didnt have COPD   Dyspnea    Dysrhythmia    hx afib   Heart murmur    had a murmur before the aortic valve replacement, no murmur according to dr. Mona per patient   Hypertension    Non-ischemic cardiomyopathy (HCC)  01/23/2016   EF 30-35%   Pneumonia    Severe aortic stenosis    SOBOE (shortness of breath on exertion)     Past Surgical History:  Procedure Laterality Date   ABDOMINAL AORTOGRAM W/LOWER EXTREMITY N/A 10/16/2016   Procedure: Abdominal Aortogram w/Lower Extremity;  Surgeon: Deatrice DELENA Cage, MD;  Location: [facility] INVASIVE CV LAB;  Service: Cardiovascular;  Laterality: N/A;   AORTIC VALVE REPLACEMENT N/A 01/29/2016   Procedure: AORTIC VALVE REPLACEMENT (AVR) with Magna Ease size 25mm;  Surgeon: Maude Fleeta Ochoa, MD;  Location: [facility] OR;  Service: Open Heart Surgery;  Laterality: N/A;   CARDIAC CATHETERIZATION N/A 01/23/2016   Procedure: Right/Left Heart Cath and Coronary Angiography;  Surgeon: Vinie JAYSON Mona, MD;  Location: [facility] INVASIVE CV LAB;  Service: Cardiovascular;  Laterality: N/A;   CARDIOVERSION N/A 03/26/2016   Procedure: CARDIOVERSION;  Surgeon: Vinie JAYSON Mona, MD;  Location: [facility] ENDOSCOPY;  Service: Cardiovascular;  Laterality: N/A;   CATARACT EXTRACTION  12/2002 & 09/2007   ENDARTERECTOMY FEMORAL Left 11/01/2016   Procedure: ENDARTERECTOMY FEMORAL;  Surgeon: Serene Gaile ORN, MD;  Location: [facility] OR;  Service: Vascular;  Laterality: Left;   PATCH ANGIOPLASTY Left 11/01/2016   Procedure: PATCH ANGIOPLASTY;  Surgeon: Serene Gaile ORN, MD;  Location: [facility] OR;  Service: Vascular;  Laterality: Left;   SHOULDER SURGERY Right 1971   TEE WITHOUT CARDIOVERSION N/A 01/29/2016   Procedure: TRANSESOPHAGEAL ECHOCARDIOGRAM (TEE);  Surgeon: Maude Fleeta Ochoa, MD;  Location: [facility] OR;  Service: Open Heart Surgery;  Laterality: N/A;      Physical Exam   Vitals:   04/19/24 1041 04/19/24 1120 04/19/24 1354  BP: 97/70  (!) 85/60  Pulse: 86  76  Resp: 18  14  Temp: 97.7 F (36.5 C)  98.5 F (36.9 C)  TempSrc:   Oral  SpO2: 96%  94%  Weight:  76.7 kg   Height:  5' 10 (1.778 m)     Physical Exam Vitals and nursing note reviewed.  Constitutional:      General: He is not in acute distress.    Appearance: He is  well-developed.  HENT:     Head: Normocephalic and atraumatic.  Eyes:     Conjunctiva/sclera: Conjunctivae normal.  Cardiovascular:     Rate and Rhythm: Normal rate and regular rhythm.     Heart sounds: Murmur heard.  Pulmonary:     Effort: Pulmonary effort is normal. No respiratory distress.     Breath sounds: Normal breath sounds.  Abdominal:     Palpations: Abdomen is soft.     Tenderness: There is no abdominal tenderness.  Musculoskeletal:     Cervical back: Neck supple.     Comments: 2+ pitting edema in the bilateral lower extremity  Skin:    General: Skin is warm and dry.     Capillary Refill: Capillary refill takes less than 2 seconds.  Neurological:     Mental Status: He is alert.  Psychiatric:        Mood and Affect: Mood normal.      Procedures   If procedures were preformed on this patient, they  are listed below:  Procedures    Please note that this documentation was produced with the assistance of voice-to-text technology and may contain errors.     Billy Pal, MD 04/23/24 9359    Garrick Charleston, MD 04/24/24 2241

## 2024-04-19 NOTE — Consult Note (Deleted)
 Error

## 2024-04-19 NOTE — ED Notes (Signed)
 Patient remains in the triage area due to concerning vital signs and lab results.

## 2024-04-19 NOTE — Progress Notes (Signed)
 Peripherally Inserted Central Catheter Placement  The IV Nurse has discussed with the patient and/or persons authorized to consent for the patient, the purpose of this procedure and the potential benefits and risks involved with this procedure.  The benefits include less needle sticks, lab draws from the catheter, and the patient may be discharged home with the catheter. Risks include, but not limited to, infection, bleeding, blood clot (thrombus formation), and puncture of an artery; nerve damage and irregular heartbeat and possibility to perform a PICC exchange if needed/ordered by physician.  Alternatives to this procedure were also discussed.  Bard Power PICC patient education guide, fact sheet on infection prevention and patient information card has been provided to patient /or left at bedside.    PICC Placement Documentation  PICC Triple Lumen 04/19/24 Right Brachial 39 cm 0 cm (Active)  Indication for Insertion or Continuance of Line Vasoactive infusions;Chronic illness with exacerbations (CF, Sickle Cell, etc.) 04/19/24 2150  Exposed Catheter (cm) 0 cm 04/19/24 2150  Site Assessment Clean, Dry, Intact 04/19/24 2150  Lumen #1 Status Flushed;Saline locked;Blood return noted 04/19/24 2150  Lumen #2 Status Flushed;Saline locked;Blood return noted 04/19/24 2150  Lumen #3 Status Flushed;Saline locked;Blood return noted 04/19/24 2150  Dressing Type Transparent;Securing device 04/19/24 2150  Dressing Status Antimicrobial disc/dressing in place;Clean, Dry, Intact 04/19/24 2150  Line Care Connections checked and tightened 04/19/24 2150  Line Adjustment (NICU/IV Team Only) No 04/19/24 2150  Dressing Intervention New dressing;Adhesive placed at insertion site (IV team only) 04/19/24 2150  Dressing Change Due 04/26/24 04/19/24 2150       Jelisa Winn, Cherene Place 04/19/2024, 9:51 PM

## 2024-04-19 NOTE — Progress Notes (Signed)
  AHF/SHOCK Team Progress Note  68 y/o male with h/o bioprosthetic AVR in 2017, PAF, PAD, COPD and tobacco use.  Now admitted with hypotension and mildly elevated troponins.   Seen by Dr. Floretta in ER with contacted shock team. SBP low 70s in ER.  Hstrop stable 200-250 range. LA ok   Echo EF 20-25% (down from 45-50% in 4/25. Severe prosthetic AS mean grdinet . Echo images viewed personally  We moved him urgently to 2H.   Low dose DBA started at 2.5 without much benefit. NE started.   PICC line placed urgently.  Now on NE 8 (DBA off) BP 88/65 (72). Co-ox 67   + orthopnea and PND.   Will continue to titrate NE to SBP 90. Will need careful diuresis one BP stabilized.   Eventual TEE followed by R/L cath will d/w VIV TAVR with Structural/Surgical team   Total CCT 55 mins  Toribio Fuel, MD  10:56 PM

## 2024-04-20 ENCOUNTER — Encounter (HOSPITAL_COMMUNITY): Admission: EM | Disposition: A | Payer: Self-pay | Source: Home / Self Care | Attending: Internal Medicine

## 2024-04-20 ENCOUNTER — Encounter (HOSPITAL_COMMUNITY): Payer: Self-pay | Admitting: Internal Medicine

## 2024-04-20 ENCOUNTER — Inpatient Hospital Stay (HOSPITAL_COMMUNITY)

## 2024-04-20 DIAGNOSIS — I350 Nonrheumatic aortic (valve) stenosis: Secondary | ICD-10-CM | POA: Diagnosis not present

## 2024-04-20 DIAGNOSIS — I272 Pulmonary hypertension, unspecified: Secondary | ICD-10-CM | POA: Diagnosis not present

## 2024-04-20 DIAGNOSIS — I5020 Unspecified systolic (congestive) heart failure: Secondary | ICD-10-CM | POA: Diagnosis not present

## 2024-04-20 DIAGNOSIS — I739 Peripheral vascular disease, unspecified: Secondary | ICD-10-CM

## 2024-04-20 DIAGNOSIS — I2510 Atherosclerotic heart disease of native coronary artery without angina pectoris: Secondary | ICD-10-CM | POA: Diagnosis not present

## 2024-04-20 DIAGNOSIS — I5023 Acute on chronic systolic (congestive) heart failure: Secondary | ICD-10-CM | POA: Diagnosis not present

## 2024-04-20 DIAGNOSIS — R570 Cardiogenic shock: Secondary | ICD-10-CM | POA: Diagnosis not present

## 2024-04-20 HISTORY — PX: RIGHT HEART CATH AND CORONARY ANGIOGRAPHY: CATH118264

## 2024-04-20 LAB — POCT I-STAT EG7
Acid-Base Excess: 1 mmol/L (ref 0.0–2.0)
Acid-base deficit: 1 mmol/L (ref 0.0–2.0)
Bicarbonate: 25 mmol/L (ref 20.0–28.0)
Bicarbonate: 23.5 mmol/L (ref 20.0–28.0)
Calcium, Ion: 1.16 mmol/L (ref 1.15–1.40)
Calcium, Ion: 1.03 mmol/L — ABNORMAL LOW (ref 1.15–1.40)
HCT: 47 % (ref 39.0–52.0)
HCT: 45 % (ref 39.0–52.0)
Hemoglobin: 16 g/dL (ref 13.0–17.0)
Hemoglobin: 15.3 g/dL (ref 13.0–17.0)
O2 Saturation: 64 %
O2 Saturation: 62 %
Potassium: 4.2 mmol/L (ref 3.5–5.1)
Potassium: 3.9 mmol/L (ref 3.5–5.1)
Sodium: 137 mmol/L (ref 135–145)
Sodium: 140 mmol/L (ref 135–145)
TCO2: 26 mmol/L (ref 22–32)
TCO2: 25 mmol/L (ref 22–32)
pCO2, Ven: 38.9 mmHg — ABNORMAL LOW (ref 44–60)
pCO2, Ven: 36.4 mmHg — ABNORMAL LOW (ref 44–60)
pH, Ven: 7.417 (ref 7.25–7.43)
pH, Ven: 7.419 (ref 7.25–7.43)
pO2, Ven: 32 mmHg (ref 32–45)
pO2, Ven: 31 mmHg — CL (ref 32–45)

## 2024-04-20 LAB — COMPREHENSIVE METABOLIC PANEL WITH GFR
ALT: 18 U/L (ref 0–44)
AST: 21 U/L (ref 15–41)
Albumin: 3.2 g/dL — ABNORMAL LOW (ref 3.5–5.0)
Alkaline Phosphatase: 40 U/L (ref 38–126)
Anion gap: 10 (ref 5–15)
BUN: 22 mg/dL (ref 8–23)
CO2: 23 mmol/L (ref 22–32)
Calcium: 8.5 mg/dL — ABNORMAL LOW (ref 8.9–10.3)
Chloride: 102 mmol/L (ref 98–111)
Creatinine, Ser: 0.78 mg/dL (ref 0.61–1.24)
GFR, Estimated: >60 mL/min (ref >60)
Glucose, Bld: 150 mg/dL — ABNORMAL HIGH (ref 70–99)
Potassium: 3.8 mmol/L (ref 3.5–5.1)
Sodium: 135 mmol/L (ref 135–145)
Total Bilirubin: 1 mg/dL (ref 0.0–1.2)
Total Protein: 6.1 g/dL — ABNORMAL LOW (ref 6.5–8.1)

## 2024-04-20 LAB — CBC
HCT: 44.7 % (ref 39.0–52.0)
Hemoglobin: 14.8 g/dL (ref 13.0–17.0)
MCH: 33.7 pg (ref 26.0–34.0)
MCHC: 33.1 g/dL (ref 30.0–36.0)
MCV: 101.8 fL — ABNORMAL HIGH (ref 80.0–100.0)
Platelets: 140 K/uL — ABNORMAL LOW (ref 150–400)
RBC: 4.39 MIL/uL (ref 4.22–5.81)
RDW: 14.4 % (ref 11.5–15.5)
WBC: 8.5 K/uL (ref 4.0–10.5)
nRBC: 0 % (ref 0.0–0.2)

## 2024-04-20 LAB — LIPID PANEL
Cholesterol: 108 mg/dL (ref 0–200)
HDL: 52 mg/dL (ref >40)
LDL Cholesterol: 42 mg/dL (ref 0–99)
Total CHOL/HDL Ratio: 2.1 ratio
Triglycerides: 68 mg/dL (ref <150)
VLDL: 14 mg/dL (ref 0–40)

## 2024-04-20 LAB — COOXEMETRY PANEL
Carboxyhemoglobin: 2.9 % — ABNORMAL HIGH (ref 0.5–1.5)
Methemoglobin: 0.9 % (ref 0.0–1.5)
O2 Saturation: 69.9 %
Total hemoglobin: 15.3 g/dL (ref 12.0–16.0)

## 2024-04-20 SURGERY — RIGHT HEART CATH AND CORONARY ANGIOGRAPHY
Anesthesia: LOCAL

## 2024-04-20 MED ORDER — LIDOCAINE HCL (PF) 1 % IJ SOLN
INTRAMUSCULAR | Status: DC | PRN
  Administered 2024-04-20 (×2): 2 mL

## 2024-04-20 MED ORDER — SODIUM CHLORIDE 0.9 % IV SOLN: 250.0000 mL | INTRAVENOUS | Status: AC | PRN

## 2024-04-20 MED ORDER — FREE WATER
250.0000 mL | Freq: Once | Status: AC
  Administered 2024-04-20: 250 mL via ORAL

## 2024-04-20 MED ORDER — LABETALOL HCL 5 MG/ML IV SOLN: 10.0000 mg | INTRAVENOUS | Status: AC | PRN

## 2024-04-20 MED ORDER — VERAPAMIL HCL 2.5 MG/ML IV SOLN
INTRAVENOUS | Status: DC | PRN
  Administered 2024-04-20: 10 mL via INTRA_ARTERIAL

## 2024-04-20 MED ORDER — IOHEXOL 350 MG/ML SOLN
INTRAVENOUS | Status: DC | PRN
  Administered 2024-04-20: 60 mL

## 2024-04-20 MED ORDER — SODIUM CHLORIDE 0.9% FLUSH: 3.0000 mL | INTRAVENOUS | Status: DC | PRN

## 2024-04-20 MED ORDER — MIDAZOLAM HCL (PF) 2 MG/2ML IJ SOLN
INTRAMUSCULAR | Status: DC | PRN
  Administered 2024-04-20: 1 mg via INTRAVENOUS

## 2024-04-20 MED ORDER — HEPARIN (PORCINE) 25000 UT/250ML-% IV SOLN
1700.0000 [IU]/h | INTRAVENOUS | Status: DC
  Administered 2024-04-21: 1200 [IU]/h via INTRAVENOUS
  Administered 2024-04-22: 1500 [IU]/h via INTRAVENOUS
  Administered 2024-04-22 – 2024-04-24 (×3): 1600 [IU]/h via INTRAVENOUS
  Administered 2024-04-24 – 2024-04-27 (×5): 1700 [IU]/h via INTRAVENOUS
  Filled 2024-04-20 (×10): qty 250

## 2024-04-20 MED ORDER — TRAZODONE HCL 50 MG PO TABS
50.0000 mg | ORAL_TABLET | Freq: Every day | ORAL | Status: DC
  Administered 2024-04-20 – 2024-04-28 (×9): 50 mg via ORAL
  Filled 2024-04-20 (×9): qty 1

## 2024-04-20 MED ORDER — NOREPINEPHRINE BITARTRATE 1 MG/ML IV SOLN
INTRAVENOUS | Status: AC | PRN
  Administered 2024-04-20: 12 ug/min via INTRAVENOUS

## 2024-04-20 MED ORDER — DOBUTAMINE-DEXTROSE 4-5 MG/ML-% IV SOLN
2.5000 ug/kg/min | INTRAVENOUS | Status: DC
  Administered 2024-04-20: 2.5 ug/kg/min via INTRAVENOUS
  Filled 2024-04-20: qty 500
  Filled 2024-04-20 (×2): qty 250

## 2024-04-20 MED ORDER — ONDANSETRON HCL 4 MG/2ML IJ SOLN: 4.0000 mg | Freq: Four times a day (QID) | INTRAMUSCULAR | Status: DC | PRN

## 2024-04-20 MED ORDER — ORAL CARE MOUTH RINSE: 15.0000 mL | OROMUCOSAL | Status: DC | PRN

## 2024-04-20 MED ORDER — SODIUM CHLORIDE 0.9 % IV SOLN: 250.0000 mL | INTRAVENOUS | Status: DC | PRN

## 2024-04-20 MED ORDER — POTASSIUM CHLORIDE CRYS ER 20 MEQ PO TBCR
20.0000 meq | EXTENDED_RELEASE_TABLET | Freq: Once | ORAL | Status: AC
  Administered 2024-04-20: 20 meq via ORAL
  Filled 2024-04-20: qty 1

## 2024-04-20 MED ORDER — MIDAZOLAM HCL 2 MG/2ML IJ SOLN
INTRAMUSCULAR | Status: AC
  Filled 2024-04-20: qty 2

## 2024-04-20 MED ORDER — FENTANYL CITRATE (PF) 100 MCG/2ML IJ SOLN
INTRAMUSCULAR | Status: DC | PRN
  Administered 2024-04-20: 25 ug via INTRAVENOUS

## 2024-04-20 MED ORDER — VERAPAMIL HCL 2.5 MG/ML IV SOLN
INTRAVENOUS | Status: AC
  Filled 2024-04-20: qty 2

## 2024-04-20 MED ORDER — SODIUM CHLORIDE 0.9% FLUSH
3.0000 mL | Freq: Two times a day (BID) | INTRAVENOUS | Status: DC
  Administered 2024-04-20 – 2024-04-27 (×12): 3 mL via INTRAVENOUS

## 2024-04-20 MED ORDER — HYDRALAZINE HCL 20 MG/ML IJ SOLN: 10.0000 mg | INTRAMUSCULAR | Status: AC | PRN

## 2024-04-20 MED ORDER — HEPARIN (PORCINE) IN NACL 1000-0.9 UT/500ML-% IV SOLN
INTRAVENOUS | Status: DC | PRN
  Administered 2024-04-20: 1000 mL
  Administered 2024-04-20: 500 mL

## 2024-04-20 MED ORDER — POTASSIUM CHLORIDE CRYS ER 20 MEQ PO TBCR
20.0000 meq | EXTENDED_RELEASE_TABLET | ORAL | Status: AC
  Administered 2024-04-20 (×2): 20 meq via ORAL
  Filled 2024-04-20 (×2): qty 1

## 2024-04-20 MED ORDER — FUROSEMIDE 10 MG/ML IJ SOLN
80.0000 mg | Freq: Once | INTRAMUSCULAR | Status: AC
  Administered 2024-04-20: 80 mg via INTRAVENOUS
  Filled 2024-04-20: qty 8

## 2024-04-20 MED ORDER — HEPARIN SODIUM (PORCINE) 1000 UNIT/ML IJ SOLN
INTRAMUSCULAR | Status: DC | PRN
Start: 2024-04-20 — End: 2024-04-20
  Administered 2024-04-20: 3500 [IU] via INTRAVENOUS

## 2024-04-20 MED ORDER — HEPARIN (PORCINE) 25000 UT/250ML-% IV SOLN
1000.0000 [IU]/h | INTRAVENOUS | Status: DC
  Administered 2024-04-20: 1000 [IU]/h via INTRAVENOUS
  Filled 2024-04-20: qty 250

## 2024-04-20 MED ORDER — TAMSULOSIN HCL 0.4 MG PO CAPS
0.4000 mg | ORAL_CAPSULE | Freq: Every day | ORAL | Status: DC
  Administered 2024-04-20 – 2024-05-03 (×14): 0.4 mg via ORAL
  Filled 2024-04-20 (×14): qty 1

## 2024-04-20 MED ORDER — FENTANYL CITRATE (PF) 100 MCG/2ML IJ SOLN
INTRAMUSCULAR | Status: AC
  Filled 2024-04-20: qty 2

## 2024-04-20 MED ORDER — SODIUM CHLORIDE 0.9% FLUSH
3.0000 mL | Freq: Two times a day (BID) | INTRAVENOUS | Status: DC
  Administered 2024-04-20 – 2024-05-04 (×18): 3 mL via INTRAVENOUS

## 2024-04-20 MED ORDER — ACETAMINOPHEN 325 MG PO TABS
650.0000 mg | ORAL_TABLET | ORAL | Status: DC | PRN
  Administered 2024-04-21 – 2024-04-28 (×10): 650 mg via ORAL
  Filled 2024-04-20 (×11): qty 2

## 2024-04-20 SURGICAL SUPPLY — 12 items
CATH 5FR JL3.5 JR4 ANG PIG MP (CATHETERS) IMPLANT
CATH EXPO 5F MPA-1 (CATHETERS) IMPLANT
CATH INFINITI 5FR JL4 (CATHETERS) IMPLANT
CATH SWAN GANZ 7F STRAIGHT (CATHETERS) IMPLANT
DEVICE RAD COMP TR BAND LRG (VASCULAR PRODUCTS) IMPLANT
GLIDESHEATH SLEND SS 6F .021 (SHEATH) IMPLANT
GUIDEWIRE INQWIRE 1.5J.035X260 (WIRE) IMPLANT
PACK CARDIAC CATHETERIZATION (CUSTOM PROCEDURE TRAY) ×1 IMPLANT
SET ATX-X65L (MISCELLANEOUS) IMPLANT
SHEATH PINNACLE 7F 10CM (SHEATH) IMPLANT
SHEATH PROBE COVER 6X72 (BAG) IMPLANT
WIRE MICRO SET SILHO 5FR 7 (SHEATH) IMPLANT

## 2024-04-20 NOTE — Consult Note (Cosign Needed)
 HEART AND VASCULAR CENTER   MULTIDISCIPLINARY HEART VALVE TEAM  Cardiology Consultation:   Patient ID: [name] MRN: [id number]; DOB: 04-24-1956  Admit date: 04/19/2024 Date of Consult: 04/20/2024  Primary Care Provider: Sabas Norleen PARAS., MD [facility] Cardiologist: Vinie JAYSON Maxcy, MD  [facility] Electrophysiologist:  None    Patient Profile:   [name] is a 68 y.o. male with a hx of COPD with ongoing tobacco abuse, significant PAD with previous SFA intervention, PAF on Xarelto , bicuspid aortic valve s/p 25 mm Edwards Magna Ease pericardial tissue (2017), nonischemic cardiomyopathy with mildly reduced EF and bioprosthetic valve failure with severe AS who is being seen today for the evaluation of VIV TAVR candidacy at the request of Dr. Floretta.  History of Present Illness:   [name] lives in [place] alone.  His wife passed away last year and he takes care of all of his own ADLs including cooking and cleaning and driving.  He has 4 children, 2 of which live locally.  He has grandkids and great grandkids. He is normally very active working out in the yard, walking his dogs, and working on trucks.  However, over the past few months he has slowly declined to the point where he cannot do any of his normal activities without significant dyspnea.  Of note, he has no teeth.  He quit smoking after his first aortic valve replacement but then resumed about 4 years ago and is smoking about 1.5 packs/day.  He has history of nonischemic cardiomyopathy with EF as low as 20-25% in 2017.  Cardiac catheterization at that time demonstrated nonobstructive disease. He also had a bicuspid aortic valve with severe stenosis and insufficiency and underwent AVR with a 25 mm [facility] Ease pericardial tissue valve by Dr. Fleeta Brooke in 12/2015. Post op course c/b bilateral pleural effusions requiring thoracentesis and PAF. During recovery noted to have atrial fibrillation but eventually  cardioverted without any documented recurrences.  He remains on Xarleto. Cath at the time of AVR showed non obst CAD (40% mRCA stenosis, 10% pRCA stenosis). He developed LE claudication and he underwent left common femoral, superficial femoral, profunda femoral and external iliac endarterectomy with patch angioplasty in 2018.  Echo from 10/22/23 showed EF 45-50% and severe stenosis of the bioprosthetic aortic valve with dilatation of the left ventricle and mean gradient 40 mmHg.  Aortic regurgitation was noted to be trivial.  Plans were made to refer to structural heart, but then he was seen in the office on 11/05/2023 for follow up and reported being asymptomatic, so it was decided to continue surveillance in 6 months.   Since that time he has developed worsening dyspnea on exertion, fatigue, and persistent cough.  In early September he had what sounds like a upper respiratory infection associated with fever and nasal congestion.  Since that time he has been coughing. He recently saw PCP treated with Levaquin. He then developed hypotension and sent to ER 03/25/24. He was treated with IVFs and discharged home.   He represented to [facility] on 04/19/24 with resting dyspnea and hypotension BP 70/55. Work up in ER: BNP 1400+. Chest x-ray with new bilateral small pleural effusions with mild interstitial edema and new bibasilar opacities. Respiratory panel negative. Potassium 4.4. Creatinine 0.77. Hemoglobin 14.9. Hs trop 218>>259>>268 c/w demand ischemia.  Lactate normal.   Echo 04/19/24 showed EF 20-25%, WMAs, mod-severe LV dilation, mild LVH, severe LAE, mod-severe MR, and severe bioprosthetic valve failure with severe AS: mean gradient 49  mm hg, Vmax 4.72 m/s, AVA 0.61cm2 and moderate to severe AI. Patient was felt to be in cardiogenic shock 2/2 severe AS and acute CHF and advacned CHF team consulted.  He has been started on dobutamine and norepinephrine.   [facility] 04/20/24 showed 95% stenosis in a large PLV vessel  (the RCA has a relatively early bifurcation), low RA pressure but elevated PCWP, mild pulmonary venous hypertension.  Cardiac output noted to be adequate but on 2 pressors.  The structural heart team is consulted for consideration of valve in valve TAVR.  The patient is seen sitting alone in bed.  He is breathing better after diuresis.  He denies chest pain, dizziness or syncope.  He denies lower extremity edema, abdominal distention or bendopnea.  He does sound like he had orthopnea or PND, leading to his presentation to the emergency department after panting in his armchair.  His main complaints are productive cough, progressive, severe dyspnea on exertion as well as marked fatigue.   Past Medical History:  Diagnosis Date   Asthma    CAD (coronary artery disease) 01/23/2016   minor CAD at cath-40% RCA   CHF (congestive heart failure) (HCC)    aortic valve replacement   COPD (chronic obstructive pulmonary disease) (HCC)    denies patient said that a pulmonologist said he said that he didnt have COPD   Dyspnea    Dysrhythmia    hx afib   Heart murmur    had a murmur before the aortic valve replacement, no murmur according to dr. Mona per patient   Hypertension    Non-ischemic cardiomyopathy (HCC) 01/23/2016   EF 30-35%   Pneumonia    Severe aortic stenosis    SOBOE (shortness of breath on exertion)     Past Surgical History:  Procedure Laterality Date   ABDOMINAL AORTOGRAM W/LOWER EXTREMITY N/A 10/16/2016   Procedure: Abdominal Aortogram w/Lower Extremity;  Surgeon: Deatrice DELENA Cage, MD;  Location: [facility] INVASIVE CV LAB;  Service: Cardiovascular;  Laterality: N/A;   AORTIC VALVE REPLACEMENT N/A 01/29/2016   Procedure: AORTIC VALVE REPLACEMENT (AVR) with Magna Ease size 25mm;  Surgeon: Maude Fleeta Ochoa, MD;  Location: [facility] OR;  Service: Open Heart Surgery;  Laterality: N/A;   CARDIAC CATHETERIZATION N/A 01/23/2016   Procedure: Right/Left Heart Cath and Coronary Angiography;  Surgeon:  Vinie JAYSON Mona, MD;  Location: [facility] INVASIVE CV LAB;  Service: Cardiovascular;  Laterality: N/A;   CARDIOVERSION N/A 03/26/2016   Procedure: CARDIOVERSION;  Surgeon: Vinie JAYSON Mona, MD;  Location: [facility] ENDOSCOPY;  Service: Cardiovascular;  Laterality: N/A;   CATARACT EXTRACTION  12/2002 & 09/2007   ENDARTERECTOMY FEMORAL Left 11/01/2016   Procedure: ENDARTERECTOMY FEMORAL;  Surgeon: Serene Gaile ORN, MD;  Location: [facility] OR;  Service: Vascular;  Laterality: Left;   PATCH ANGIOPLASTY Left 11/01/2016   Procedure: PATCH ANGIOPLASTY;  Surgeon: Serene Gaile ORN, MD;  Location: [facility] OR;  Service: Vascular;  Laterality: Left;   SHOULDER SURGERY Right 07/01/1969   TEE WITHOUT CARDIOVERSION N/A 01/29/2016   Procedure: TRANSESOPHAGEAL ECHOCARDIOGRAM (TEE);  Surgeon: Maude Fleeta Ochoa, MD;  Location: [facility] OR;  Service: Open Heart Surgery;  Laterality: N/A;     Home Medications:  Prior to Admission medications   Medication Sig Start Date End Date Taking? Authorizing Provider  acetaminophen  (TYLENOL ) 500 MG tablet Take 1,000 mg by mouth daily as needed for headache.    [provider]  albuterol  (VENTOLIN  HFA) 108 (90 Base) MCG/ACT inhaler Inhale 2 puffs into the lungs every 4 (  four) hours as needed for wheezing or shortness of breath. 03/22/24   [provider]  atorvastatin  (LIPITOR) 10 MG tablet TAKE 1 TABLET BY MOUTH EVERY DAY 08/17/21   Hilty, Vinie BROCKS, MD  carvedilol  (COREG ) 3.125 MG tablet TAKE 1 TABLET BY MOUTH TWICE A DAY WITH MEALS 09/18/21   Hilty, Vinie BROCKS, MD  empagliflozin  (JARDIANCE ) 10 MG TABS tablet Take 1 tablet (10 mg total) by mouth daily before breakfast. 11/05/23   Hilty, Vinie BROCKS, MD  empagliflozin  (JARDIANCE ) 10 MG TABS tablet Take 1 tablet (10 mg total) by mouth daily before breakfast. 11/05/23   Hilty, Vinie BROCKS, MD  finasteride  (PROSCAR ) 5 MG tablet Take 1 tablet (5 mg total) by mouth daily. Patient taking differently: Take 5 mg by mouth every evening. 02/20/16   Hilty, Vinie BROCKS, MD  levofloxacin (LEVAQUIN) 500 MG tablet Take 500 mg by mouth daily. 03/22/24   [provider]  losartan  (COZAAR ) 100 MG tablet Take 1 tablet by mouth once daily 12/25/21   Hilty, Vinie BROCKS, MD  naproxen sodium (ALEVE) 220 MG tablet Take 440 mg by mouth daily as needed (pain).    [provider]  rivaroxaban  (XARELTO ) 20 MG TABS tablet Take 1 tablet by mouth once daily Patient taking differently: Take 20 mg by mouth daily with supper. 11/18/23   Hilty, Vinie BROCKS, MD  tamsulosin  (FLOMAX ) 0.4 MG CAPS capsule Take 0.4 mg by mouth every evening. 01/03/16   [provider]    Inpatient Medications: Scheduled Meds:  aspirin  EC  81 mg Oral Daily   atorvastatin   10 mg Oral Daily   Chlorhexidine  Gluconate Cloth  6 each Topical Daily   free water  250 mL Oral Once   potassium chloride   20 mEq Oral Q4H   sodium chloride  flush  10-40 mL Intracatheter Q12H   sodium chloride  flush  3 mL Intravenous Q12H   sodium chloride  flush  3 mL Intravenous Q12H   tamsulosin   0.4 mg Oral QPC supper   traZODone  50 mg Oral QHS   Continuous Infusions:  sodium chloride  Stopped (04/19/24 2206)   sodium chloride      DOBUTamine 2.5 mcg/kg/min (04/20/24 1200)   heparin  Stopped (04/20/24 1249)   norepinephrine (LEVOPHED) Adult infusion 10 mcg/min (04/20/24 1200)   PRN Meds: sodium chloride , acetaminophen , hydrALAZINE , labetalol , ondansetron  (ZOFRAN ) IV, mouth rinse, sodium chloride  flush, sodium chloride  flush, sodium chloride  flush  Allergies:    Allergies  Allergen Reactions   No Known Allergies     Social History:   Social History   Socioeconomic History   Marital status: Married    Spouse name: Not on file   Number of children: 4   Years of education: 10   Highest education level: Not on file  Occupational History   Occupation: Holiday representative    Comment: Engineer, mining  Tobacco Use   Smoking status: Unknown   Smokeless tobacco: Former  Advertising account planner   Vaping status:  Former   Quit date: 08/28/2015  Substance and Sexual Activity   Alcohol use: Yes    Alcohol/week: 12.0 standard drinks of alcohol    Types: 12 Standard drinks or equivalent per week    Comment: beer   Drug use: No   Sexual activity: Not on file  Other Topics Concern   Not on file  Social History Narrative   Not on file   Social Drivers of Health   Financial Resource Strain: Not on file  Food Insecurity: No Food Insecurity (  04/19/2024)   Hunger Vital Sign    Worried About Running Out of Food in the Last Year: Never true    Ran Out of Food in the Last Year: Never true  Transportation Needs: No Transportation Needs (04/19/2024)   PRAPARE - Administrator, Civil Service (Medical): No    Lack of Transportation (Non-Medical): No  Physical Activity: Not on file  Stress: Not on file  Social Connections: Socially Isolated (04/19/2024)   Social Connection and Isolation Panel    Frequency of Communication with Friends and Family: More than three times a week    Frequency of Social Gatherings with Friends and Family: Twice a week    Attends Religious Services: Never    Database administrator or Organizations: No    Attends Banker Meetings: Never    Marital Status: Widowed  Intimate Partner Violence: Not At Risk (04/19/2024)   Humiliation, Afraid, Rape, and Kick questionnaire    Fear of Current or Ex-Partner: No    Emotionally Abused: No    Physically Abused: No    Sexually Abused: No    Family History:    Family History  Problem Relation Age of Onset   Breast cancer Mother    Breast cancer Sister      ROS:  Please see the history of present illness.   All other ROS reviewed and negative.     Physical Exam/Data:   Vitals:   04/20/24 1434 04/20/24 1439 04/20/24 1444 04/20/24 1449  BP: 104/69 98/75 103/76   Pulse: 76 70 71 (!) 0  Resp: (!) 28 (!) 30 (!) 26   Temp:      TempSrc:      SpO2: (!) 88% 92% (!) 89% 93%  Weight:      Height:         Intake/Output Summary (Last 24 hours) at 04/20/2024 1548 Last data filed at 04/20/2024 1438 Gross per 24 hour  Intake 343.35 ml  Output 2300 ml  Net -1956.65 ml      04/19/2024    8:26 PM 04/19/2024   11:20 AM 03/25/2024    1:06 PM  Last 3 Weights  Weight (lbs) 162 lb 11.2 oz 169 lb 171 lb  Weight (kg) 73.8 kg 76.658 kg 77.565 kg     Body mass index is 23.34 kg/m.  General:  Well nourished, well developed, in no acute distress HEENT: normal Lymph: no adenopathy Neck: no JVD Cardiac:  normal S1, S2; RRR; very faint Benjamin  Lungs course lung sounds with prolonged expiratory phase Abd: soft, nontender Ext: no edema Musculoskeletal:  No deformities, BUE and BLE strength normal and equal Skin: warm and dry  Neuro:  CNs 2-12 intact, no focal abnormalities noted Psych:  Normal affect   EKG:  The EKG was personally reviewed and demonstrates:  NSR with PVCs, LVH, HR 78 Telemetry:  Telemetry was personally reviewed and demonstrates:  sinus in 80s  Cardiac Studies & Procedures   ______________________________________________________________________________________________ CARDIAC CATHETERIZATION  CARDIAC CATHETERIZATION 04/20/2024  Conclusion   RPAV lesion is 95% stenosed.  1. 95% stenosis in a large PLV vessel (the RCA has a relatively early bifurcation). 2. Low RA pressure but elevated PCWP. 3. Mild pulmonary venous hypertension. 4. Cardiac output adequate on dobutamine + norepinephrine.  Findings Coronary Findings Diagnostic  Dominance: Right  Left Main Vessel was injected. Vessel is normal in caliber. Vessel is angiographically normal.  Left Anterior Descending Vessel was injected. Vessel is normal in caliber. The  vessel exhibits minimal luminal irregularities.  Left Circumflex Vessel was injected. Vessel is normal in caliber. There is mild diffuse disease throughout the vessel.  Right Coronary Artery  Right Posterior Atrioventricular Artery RPAV lesion is  95% stenosed.  Third Right Posterolateral Branch Collaterals 3rd RPL filled by collaterals from Dist LAD.  Intervention  No interventions have been documented.   CARDIAC CATHETERIZATION  CARDIAC CATHETERIZATION 01/23/2016  Conclusion  Mid RCA lesion, 40 %stenosed.  Prox RCA lesion, 10 %stenosed.  Hemodynamic findings consistent with moderate pulmonary hypertension, mitral valve regurgitation and aortic valve stenosis.  Non-obstructive CAD. Mildly reduced cardiac index. PA 58%, PCWP 28,  Mean PA 45. BNP was 1100 and CXR shows pulmonary edema. Will admit for diuresis and consult CT surgery to evaluate for AVR. Repeat echocardiogram.  Vinie KYM Maxcy, MD, [facility] Attending Cardiologist [facility]  Findings Coronary Findings Diagnostic  Dominance: Right  Left Main Vessel was injected. Vessel is normal in caliber. Vessel is angiographically normal.  Left Anterior Descending Vessel was injected. Vessel is normal in caliber. The vessel exhibits minimal luminal irregularities.  First Diagonal Branch Vessel was injected. Vessel is normal in size. There is mild  diffuse disease in the vessel.  Left Circumflex Vessel was injected. Vessel is small.  First Obtuse Marginal Branch Vessel is small in size.  Right Coronary Artery Vessel was injected. Vessel is large. The lesion is focal and eccentric. The lesion is moderately calcified. The lesion is focal and eccentric. The lesion is moderately calcified.  Intervention  No interventions have been documented.     ECHOCARDIOGRAM  ECHOCARDIOGRAM COMPLETE 04/19/2024  Narrative ECHOCARDIOGRAM REPORT    Patient Name:   [name] Date of Exam: 04/19/2024 Medical Rec #:  [id number]       Height:       70.0 in Accession #:    [id number]      Weight:       169.0 lb Date of Birth:  [birth year]       BSA:          1.943 m Patient Age:    68 years        BP:           78/59 mmHg Patient Gender: M               HR:            71 bpm. Exam Location:  Inpatient  Procedure: 2D Echo, Cardiac Doppler and Color Doppler (Both Spectral and Color Flow Doppler were utilized during procedure).  Indications:    CHF-Acute Systolic I50.21  History:        Patient has prior history of Echocardiogram examinations, most recent 10/22/2023. CHF and Cardiomyopathy, PAD and COPD, Arrythmias:Atrial Fibrillation and Atrial Flutter, Signs/Symptoms:Shortness of Breath; Risk Factors:Dyslipidemia. Aortic Valve: 25 mm Edwards valve is present in the aortic position. Procedure Date: 2017.  Sonographer:    Thea Norlander RCS Referring Phys: [id number] SHENG L HALEY  IMPRESSIONS   1. Left ventricular ejection fraction, by estimation, is 20 to 25%. The left ventricle has severely decreased function. The left ventricle demonstrates regional wall motion abnormalities (see scoring diagram/findings for description). The left ventricular internal cavity size was moderately to severely dilated. There is mild concentric left ventricular hypertrophy. Left ventricular diastolic parameters are indeterminate. 2. Right ventricular systolic function is normal. The right ventricular size is normal. Tricuspid regurgitation signal is inadequate for assessing PA pressure. 3. Left atrial size was severely dilated. 4. Right atrial  size was mild to moderately dilated. 5. The mitral valve is degenerative. Moderate to severe mitral valve regurgitation. No evidence of mitral stenosis. 6. 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 Details below. 7. The aortic valve was not well visualized. Aortic valve regurgitation is mild to moderate. There is a 25 mm Edwards valve present in the aortic position. Procedure Date: 2017. Echo findings are consistent with stenosis of the aortic prosthesis. Aortic valve area, by VTI measures 0.61 cm. Aortic valve mean gradient measures 49.0 mmHg. Aortic valve Vmax measures 4.72 m/s. Aortic valve  acceleration time measures 169 msec. 8. Aortic dilatation noted. There is borderline dilatation of the aortic root, measuring 37 mm. 9. The inferior vena cava is normal in size with greater than 50% respiratory variability, suggesting right atrial pressure of 3 mmHg. 10. Cannot exclude a small PFO.  Comparison(s): A prior study was performed on 10/22/2023. The estimated ejection fraction was 45-50% with hypokinesis of inferior and posterior walls. There was grade II diastolic dysfunction with elevated LAP. There was moderate biatrial enlargement. 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  FINDINGS Left Ventricle: Left ventricular ejection fraction, by estimation, is 20 to 25%. The left ventricle has severely decreased function. The left ventricle demonstrates regional wall motion abnormalities. The left ventricular internal cavity size was moderately to severely dilated. There is mild concentric left ventricular hypertrophy. Left ventricular diastolic parameters are indeterminate.   LV Wall Scoring: The entire inferior wall and posterior wall are hypokinetic.  Right Ventricle: The right ventricular size is normal. No increase in right ventricular wall thickness. Right ventricular systolic function is normal. Tricuspid regurgitation signal is inadequate for assessing PA pressure.  Left Atrium: Left atrial size was severely dilated.  Right Atrium: Right atrial size was mild to moderately dilated.  Pericardium: There is no evidence of pericardial effusion.  Mitral Valve: The mitral valve is degenerative in appearance. Mild mitral annular calcification. Moderate to severe mitral valve regurgitation. No evidence of mitral valve stenosis.  Tricuspid Valve: The tricuspid valve is normal in structure. Tricuspid valve regurgitation is trivial. No evidence of tricuspid stenosis.  Aortic Valve: The aortic valve was not well visualized.  Aortic valve regurgitation is mild to moderate. Aortic valve mean gradient measures 49.0 mmHg. Aortic valve peak gradient measures 88.9 mmHg. Aortic valve area, by VTI measures 0.61 cm. There is a 25 mm Edwards valve present in the aortic position. Procedure Date: 2017.  Pulmonic Valve: The pulmonic valve was normal in structure. Pulmonic valve regurgitation is trivial. No evidence of pulmonic stenosis.  Aorta: Aortic dilatation noted. There is borderline dilatation of the aortic root, measuring 37 mm.  Venous: The inferior vena cava is normal in size with greater than 50% respiratory variability, suggesting right atrial pressure of 3 mmHg.  IAS/Shunts: Cannot exclude a small PFO.  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 Details below.   LEFT VENTRICLE PLAX 2D LVIDd:         6.60 cm   Diastology LVIDs:         5.30 cm   LV e' medial:    4.03 cm/s LV PW:         1.10 cm   LV E/e' medial:  30.5 LV IVS:        1.10 cm   LV e' lateral:   9.14 cm/s LVOT diam:     2.30 cm   LV E/e' lateral:  13.5 LV SV:         68 LV SV Index:   35 LVOT Area:     4.15 cm   RIGHT VENTRICLE             IVC RV S prime:     11.70 cm/s  IVC diam: 1.90 cm TAPSE (M-mode): 2.2 cm  LEFT ATRIUM              Index        RIGHT ATRIUM           Index LA diam:        4.10 cm  2.11 cm/m   RA Area:     21.40 cm LA Vol (A2C):   104.0 ml 53.52 ml/m  RA Volume:   62.70 ml  32.27 ml/m LA Vol (A4C):   106.0 ml 54.55 ml/m LA Biplane Vol: 106.0 ml 54.55 ml/m AORTIC VALVE AV Area (Vmax):    0.60 cm AV Area (Vmean):   0.60 cm AV Area (VTI):     0.61 cm AV Vmax:           471.50 cm/s AV Vmean:          323.000 cm/s AV VTI:            1.115 m AV Peak Grad:      88.9 mmHg AV Mean Grad:      49.0 mmHg LVOT Vmax:         68.00 cm/s LVOT Vmean:        46.900 cm/s LVOT VTI:          0.164 m LVOT/AV VTI ratio: 0.15  AORTA Ao Root diam: 3.70 cm Ao Asc diam:  3.50  cm  MITRAL VALVE MV Area (PHT): 5.02 cm     SHUNTS MV Decel Time: 151 msec     Systemic VTI:  0.16 m MV E velocity: 123.00 cm/s  Systemic Diam: 2.30 cm  Emeline Calender Electronically signed by Emeline Calender Signature Date/Time: 04/19/2024/7:22:45 PM    Final   TEE  ECHO TEE 01/29/2016  Narrative *[place]* *[facility]* [address] [place], [place] [address] [phone]  ------------------------------------------------------------------- Transesophageal Echocardiography  Patient:    [name], [name] MR #:       [id number] Study Date: 01/29/2016 Gender:     M Age:        60 Height:     176.5 cm Weight:     74 kg BSA:        1.91 m^2 Pt. Status: Room:       2W21C  ATTENDING    Obadiah Maude TISA Obadiah Maude BARTON    VanTrigt, Peter ADMITTING    Vinie Maxcy MD PERFORMING   Juliene Clinton, MD SONOGRAPHER  Annabella Fell, RDCS  cc:  ------------------------------------------------------------------- LV EF: 20% -   25%  ------------------------------------------------------------------- Indications:      Aortic stenosis 424.1.  ------------------------------------------------------------------- History:   PMH:  Non-Ischemic Cardiomyopathy. Arterial Hypotension. Coronary artery disease.  Congestive heart failure.  Chronic obstructive pulmonary disease.  ------------------------------------------------------------------- Study Conclusions  - Left ventricle: Systolic function was severely reduced. The estimated ejection fraction was in the range of 20% to 25%. - Aortic valve: Valve area (VTI): 1.25 cm^2. Valve area (Vmax): 1.14 cm^2. Valve area (Vmean): 1.29 cm^2. - Mitral valve: There was mild regurgitation. - Left atrium: The atrium was dilated. No evidence of thrombus in the atrial cavity or appendage. - Right atrium:  The atrium was dilated. No evidence of thrombus in the atrial cavity or  appendage.  ------------------------------------------------------------------- Study data:   Study status:  Routine.  Consent:  The risks, benefits, and alternatives to the procedure were explained to the patient and informed consent was obtained.  Procedure:  The patient reported no pain pre or post test. Initial setup. The patient was brought to the laboratory. Surface ECG leads were monitored. Sedation. Conscious sedation was administered. General anesthesia was administered. TEE probe inserted atraumatically by anesthesiologist.  Study completion:  The patient tolerated the procedure well. There were no complications.          Diagnostic transesophageal echocardiography.  2D and color Doppler. Birthdate:  Patient birthdate: [birth year].  Age:  Patient is 68 yr old.  Sex:  Gender: male.    BMI: 23.8 kg/m^2.  Blood pressure: 108/73  Patient status:  Inpatient.  Study date:  Study date: 01/29/2016. Study time: 09:53 AM.  Location:  Operating room.  -------------------------------------------------------------------  ------------------------------------------------------------------- Left ventricle:  Systolic function was severely reduced. The estimated ejection fraction was in the range of 20% to 25%. Diffuse hypokinesis. Dilated.  ------------------------------------------------------------------- Aortic valve:  Severe stenosis. Heavily calcified. Mild regurgitation. Post CPB, prosthetic aortic valve is visualized. Seated properly with no evidence of regurgitation or stenosis. Doppler:     VTI ratio of LVOT to aortic valve: 0.3. Valve area (VTI): 1.25 cm^2. Indexed valve area (VTI): 0.65 cm^2/m^2. Peak velocity ratio of LVOT to aortic valve: 0.28. Valve area (Vmax): 1.14 cm^2. Indexed valve area (Vmax): 0.6 cm^2/m^2. Mean velocity ratio of LVOT to aortic valve: 0.31. Valve area (Vmean): 1.29 cm^2. Indexed valve area (Vmean): 0.68 cm^2/m^2.    Mean gradient (S): 22 mm Hg. Peak  gradient (S): 45 mm Hg.  ------------------------------------------------------------------- Aorta:  There was no atheroma. There was no evidence for dissection. Aortic root: The aortic root was not dilated. Ascending aorta: The ascending aorta was normal in size. Aortic arch: The aortic arch was normal in size. Descending aorta: The descending aorta was normal in size.  ------------------------------------------------------------------- Mitral valve:   Structurally normal valve.   Leaflet separation was normal.  Doppler:  There was mild regurgitation.  ------------------------------------------------------------------- Left atrium:  The atrium was dilated.  No evidence of thrombus in the atrial cavity or appendage. The appendage was morphologically a left appendage, multilobulated, and of normal size. Emptying velocity was normal.  ------------------------------------------------------------------- Right ventricle:  The cavity size was normal. Wall thickness was normal. Systolic function was normal.  ------------------------------------------------------------------- Pulmonic valve:    Structurally normal valve.  ------------------------------------------------------------------- Tricuspid valve:   Structurally normal valve.   Leaflet separation was normal.  Doppler:  There was no significant regurgitation.  ------------------------------------------------------------------- Pulmonary artery:   The main pulmonary artery was normal-sized.  ------------------------------------------------------------------- Right atrium:  The atrium was dilated.  No evidence of thrombus in the atrial cavity or appendage. The appendage was morphologically a right appendage.  ------------------------------------------------------------------- Pericardium:  There was no pericardial effusion.  ------------------------------------------------------------------- Measurements  Left ventricle                            Value Stroke volume, 2D                        105   ml Stroke volume/bsa, 2D                    55    ml/m^2  LVOT  Value LVOT ID, S                               23    mm LVOT area                                4.15  cm^2 LVOT peak velocity, S                    92.5  cm/s LVOT mean velocity, S                    65.4  cm/s LVOT VTI, S                              25.2  cm  Aortic valve                             Value Aortic valve peak velocity, S            336   cm/s Aortic valve mean velocity, S            210   cm/s Aortic valve VTI, S                      83.9  cm Aortic mean gradient, S                  22    mm Hg Aortic peak gradient, S                  45    mm Hg VTI ratio, LVOT/AV                       0.3 Aortic valve area, VTI                   1.25  cm^2 Aortic valve area/bsa, VTI               0.65  cm^2/m^2 Velocity ratio, peak, LVOT/AV            0.28 Aortic valve area, peak velocity         1.14  cm^2 Aortic valve area/bsa, peak velocity     0.6   cm^2/m^2 Velocity ratio, mean, LVOT/AV            0.31 Aortic valve area, mean velocity         1.29  cm^2 Aortic valve area/bsa, mean velocity     0.68  cm^2/m^2  Legend: (L)  and  (H)  mark values outside specified reference range.  ------------------------------------------------------------------- Prepared and Electronically Authenticated by  Juliene Clinton, MD 2017-08-15T08:40:04        ______________________________________________________________________________________________      Laboratory Data:  High Sensitivity Troponin:   Recent Labs  Lab 03/25/24 1359 03/25/24 1550 04/19/24 1133 04/19/24 1354 04/19/24 1811  TROPONINIHS 85* 89* 218* 259* 268*     Chemistry Recent Labs  Lab 04/19/24 1133 04/20/24 0449  NA 138 135  K 4.4 3.8  CL 105 102  CO2 23 23  GLUCOSE 104* 150*  BUN 20 22  CREATININE 0.77 0.78  CALCIUM  9.1 8.5*   GFRNONAA >60 >60  ANIONGAP 10 10  Recent Labs  Lab 04/19/24 2234 04/20/24 0449  PROT 5.9* 6.1*  ALBUMIN  3.3* 3.2*  AST 19 21  ALT 18 18  ALKPHOS 37* 40  BILITOT 1.0 1.0   Hematology Recent Labs  Lab 04/19/24 1133 04/20/24 0449  WBC 9.6 8.5  RBC 4.38 4.39  HGB 14.9 14.8  HCT 44.1 44.7  MCV 100.7* 101.8*  MCH 34.0 33.7  MCHC 33.8 33.1  RDW 14.3 14.4  PLT 133* 140*   BNP Recent Labs  Lab 04/19/24 1133  BNP 1,400.2*    DDimer No results for input(s): DDIMER in the last 168 hours.   Radiology/Studies:  CARDIAC CATHETERIZATION Result Date: 04/20/2024   RPAV lesion is 95% stenosed. 1. 95% stenosis in a large PLV vessel (the RCA has a relatively early bifurcation). 2. Low RA pressure but elevated PCWP. 3. Mild pulmonary venous hypertension. 4. Cardiac output adequate on dobutamine + norepinephrine.   DG CHEST PORT 1 VIEW Result Date: 04/20/2024 CLINICAL DATA:  Shortness of breath EXAM: PORTABLE CHEST 1 VIEW COMPARISON:  Chest radiograph dated 04/19/2024 FINDINGS: Lines/tubes: Right upper extremity PICC tip projects over the SVC. Lungs: Well inflated lungs. Increased diffuse interstitial opacities and bibasilar patchy and dense left retrocardiac opacity. Pleura: Slightly increased small bilateral pleural effusions. No pneumothorax. Heart/mediastinum: Similar enlarged, postsurgical cardiomediastinal silhouette. Bones: Median sternotomy wires are nondisplaced. Degenerative changes of the right shoulder. IMPRESSION: 1. Interval placement of right upper extremity PICC with tip projecting over the SVC. No pneumothorax. 2. Increased pulmonary edema. 3. Slightly increased small bilateral pleural effusions. 4. Bibasilar patchy and dense left retrocardiac opacity, likely a combination of pulmonary edema and atelectasis. Electronically Signed   By: Limin  Xu M.D.   On: 04/20/2024 09:30   US  EKG SITE RITE Result Date: 04/19/2024 If Site Rite image not attached, placement could not  be confirmed due to current cardiac rhythm.  ECHOCARDIOGRAM COMPLETE Result Date: 04/19/2024    ECHOCARDIOGRAM REPORT   Patient Name:   [name] Date of Exam: 04/19/2024 Medical Rec #:  [id number]       Height:       70.0 in Accession #:    [id number]      Weight:       169.0 lb Date of Birth:  06/05/1956       BSA:          1.943 m Patient Age:    68 years        BP:           78/59 mmHg Patient Gender: M               HR:           71 bpm. Exam Location:  Inpatient Procedure: 2D Echo, Cardiac Doppler and Color Doppler (Both Spectral and Color            Flow Doppler were utilized during procedure). Indications:    CHF-Acute Systolic I50.21  History:        Patient has prior history of Echocardiogram examinations, most                 recent 10/22/2023. CHF and Cardiomyopathy, PAD and COPD,                 Arrythmias:Atrial Fibrillation and Atrial Flutter,                 Signs/Symptoms:Shortness of Breath; Risk Factors:Dyslipidemia.  Aortic Valve: 25 mm Edwards valve is present in the aortic                 position. Procedure Date: 2017.  Sonographer:    Thea Norlander RCS Referring Phys: [id number] SHENG L HALEY IMPRESSIONS  1. Left ventricular ejection fraction, by estimation, is 20 to 25%. The left ventricle has severely decreased function. The left ventricle demonstrates regional wall motion abnormalities (see scoring diagram/findings for description). The left ventricular internal cavity size was moderately to severely dilated. There is mild concentric left ventricular hypertrophy. Left ventricular diastolic parameters are indeterminate.  2. Right ventricular systolic function is normal. The right ventricular size is normal. Tricuspid regurgitation signal is inadequate for assessing PA pressure.  3. Left atrial size was severely dilated.  4. Right atrial size was mild to moderately dilated.  5. The mitral valve is degenerative. Moderate to severe mitral valve regurgitation. No evidence  of mitral stenosis.  6. 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 Details below.  7. The aortic valve was not well visualized. Aortic valve regurgitation is mild to moderate. There is a 25 mm Edwards valve present in the aortic position. Procedure Date: 2017. Echo findings are consistent with stenosis of the aortic prosthesis. Aortic  valve area, by VTI measures 0.61 cm. Aortic valve mean gradient measures 49.0 mmHg. Aortic valve Vmax measures 4.72 m/s. Aortic valve acceleration time measures 169 msec.  8. Aortic dilatation noted. There is borderline dilatation of the aortic root, measuring 37 mm.  9. The inferior vena cava is normal in size with greater than 50% respiratory variability, suggesting right atrial pressure of 3 mmHg. 10. Cannot exclude a small PFO. Comparison(s): A prior study was performed on 10/22/2023. The estimated ejection fraction was 45-50% with hypokinesis of inferior and posterior walls. There was grade II diastolic dysfunction with elevated LAP. There was moderate biatrial enlargement. 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 FINDINGS  Left Ventricle: Left ventricular ejection fraction, by estimation, is 20 to 25%. The left ventricle has severely decreased function. The left ventricle demonstrates regional wall motion abnormalities. The left ventricular internal cavity size was moderately to severely dilated. There is mild concentric left ventricular hypertrophy. Left ventricular diastolic parameters are indeterminate.  LV Wall Scoring: The entire inferior wall and posterior wall are hypokinetic. Right Ventricle: The right ventricular size is normal. No increase in right ventricular wall thickness. Right ventricular systolic function is normal. Tricuspid regurgitation signal is inadequate for assessing PA pressure. Left Atrium: Left atrial size was severely dilated. Right Atrium: Right atrial size was mild to  moderately dilated. Pericardium: There is no evidence of pericardial effusion. Mitral Valve: The mitral valve is degenerative in appearance. Mild mitral annular calcification. Moderate to severe mitral valve regurgitation. No evidence of mitral valve stenosis. Tricuspid Valve: The tricuspid valve is normal in structure. Tricuspid valve regurgitation is trivial. No evidence of tricuspid stenosis. Aortic Valve: The aortic valve was not well visualized. Aortic valve regurgitation is mild to moderate. Aortic valve mean gradient measures 49.0 mmHg. Aortic valve peak gradient measures 88.9 mmHg. Aortic valve area, by VTI measures 0.61 cm. There is a 25 mm Edwards valve present in the aortic position. Procedure Date: 2017. Pulmonic Valve: The pulmonic valve was normal in structure. Pulmonic valve regurgitation is trivial. No evidence of pulmonic stenosis. Aorta: Aortic dilatation noted. There is borderline dilatation of the aortic root, measuring 37 mm. Venous: The inferior vena  cava is normal in size with greater than 50% respiratory variability, suggesting right atrial pressure of 3 mmHg. IAS/Shunts: Cannot exclude a small PFO. 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 Details below.  LEFT VENTRICLE PLAX 2D LVIDd:         6.60 cm   Diastology LVIDs:         5.30 cm   LV e' medial:    4.03 cm/s LV PW:         1.10 cm   LV E/e' medial:  30.5 LV IVS:        1.10 cm   LV e' lateral:   9.14 cm/s LVOT diam:     2.30 cm   LV E/e' lateral: 13.5 LV SV:         68 LV SV Index:   35 LVOT Area:     4.15 cm  RIGHT VENTRICLE             IVC RV S prime:     11.70 cm/s  IVC diam: 1.90 cm TAPSE (M-mode): 2.2 cm LEFT ATRIUM              Index        RIGHT ATRIUM           Index LA diam:        4.10 cm  2.11 cm/m   RA Area:     21.40 cm LA Vol (A2C):   104.0 ml 53.52 ml/m  RA Volume:   62.70 ml  32.27 ml/m LA Vol (A4C):   106.0 ml 54.55 ml/m LA Biplane Vol: 106.0 ml 54.55 ml/m  AORTIC  VALVE AV Area (Vmax):    0.60 cm AV Area (Vmean):   0.60 cm AV Area (VTI):     0.61 cm AV Vmax:           471.50 cm/s AV Vmean:          323.000 cm/s AV VTI:            1.115 m AV Peak Grad:      88.9 mmHg AV Mean Grad:      49.0 mmHg LVOT Vmax:         68.00 cm/s LVOT Vmean:        46.900 cm/s LVOT VTI:          0.164 m LVOT/AV VTI ratio: 0.15  AORTA Ao Root diam: 3.70 cm Ao Asc diam:  3.50 cm MITRAL VALVE MV Area (PHT): 5.02 cm     SHUNTS MV Decel Time: 151 msec     Systemic VTI:  0.16 m MV E velocity: 123.00 cm/s  Systemic Diam: 2.30 cm Emeline Calender Electronically signed by Emeline Calender Signature Date/Time: 04/19/2024/7:22:45 PM    Final    DG Chest 2 View Result Date: 04/19/2024 EXAM: 2 VIEW(S) XRAY OF THE CHEST 04/19/2024 12:14:00 PM COMPARISON: 08/26/2023 CLINICAL HISTORY: shob. SOB FINDINGS: LUNGS AND PLEURA: Chronic coarsened interstitial markings with superimposed increased interstitial markings identified. Bibasilar opacities are new from the previous exam and may reflect atelectasis or airspace disease. No pulmonary edema. No pleural effusion. No pneumothorax. HEART AND MEDIASTINUM: Status post median sternotomy. Prosthetic aortic valve. Aortic atherosclerotic calcification. BONES AND SOFT TISSUES: No acute osseous abnormality. IMPRESSION: 1. New bilateral small pleural effusions with mild interstitial edema concerning for chf. 2. New bibasilar opacities, possibly representing atelectasis or airspace disease. 3. Aortic atherosclerotic calcification. Electronically signed by: Waddell Calk MD 04/19/2024 01:38 PM EDT RP  Workstation: [id number]     Procedure Type: Isolated AVR Perioperative Outcome Estimate % Operative Mortality 8.16% Morbidity & Mortality 37% Stroke 2.79% Renal Failure 3.94% Reoperation 10.6% Prolonged Ventilation 27.7% Deep Sternal Wound Infection 0.161% Long Hospital Stay (>14 days) [facility] Stay (<6 days)* 8.55%  _____________________   [place]  Cardiomyopathy Questionnaire     04/20/2024    1:17 PM  KCCQ-12  1 a. Ability to shower/bathe Extremely limited  1 b. Ability to walk 1 block Extremely limited  1 c. Ability to hurry/jog Other, Did not do  2. Edema feet/ankles/legs 1-2 times a week  3. Limited by fatigue All of the time  5. Sitting up / on 3+ pillows Every night  6. Limited enjoyment of life Extremely limited  7. Rest of life w/ symptoms Not at all satisfied  8 a. Participation in hobbies Severely limited  8 b. Participation in chores Severely limited  8 c. Visiting family/friends Severely limited     _________________________  Pre Surgical Assessment: 5 M Walk Test  45M=16.53ft  5 Meter Walk Test- trial 1: 7.0 seconds 5 Meter Walk Test- trial 2: 7.1 seconds 5 Meter Walk Test- trial 3: 7.0 seconds 5 Meter Walk Test Average: 7.0 seconds   Assessment and Plan:   [name] is a 68 y.o. 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    Echo 04/19/24 showed EF 20-25%, WMAs, mod-severe LV dilation, mild LVH, severe LAE, mod-severe MR, and severe bioprosthetic valve failure with severe AS: mean gradient 49 mm hg, Vmax 4.72 m/s, AVA 0.61cm2 and moderate to severe AI.   [facility] 04/20/24 showed 95% stenosis in a large PLV vessel (the RCA has a relatively early bifurcation), low RA pressure but elevated PCWP, mild pulmonary venous hypertension.  Cardiac output noted to be adequate but on 2 pressors.   I have reviewed the natural history of aortic stenosis with the patient. We have discussed the limitations of medical therapy and the poor prognosis associated with symptomatic aortic stenosis. We have reviewed potential treatment options, including palliative medical therapy, conventional surgical aortic valve replacement, and transcatheter aortic valve  replacement. We discussed treatment options in the context of this patient's specific comorbid medical conditions.    The patient's predicted risk of mortality with conventional aortic valve replacement is 8.16% primarily based on age, previous AVR with sternotomy, PAD, acute CHF with shock, severe AI, COPD with tobacco abuse, MR and age. VIV TAVR seems like a reasonable treatment option for this patient pending formal cardiac surgical consultation. We discussed typical evaluation which will require a gated cardiac CTA and a CTA of the chest/abdomen/pelvis to evaluate both his cardiac anatomy and peripheral vasculature. Will plan to get CTs tomorrow as long as renal function remains normal. Access may be an issues with significant PAD and continued tobacco abuse.   We will follow along with you.    For questions or updates, please contact [facility] Please consult [email or web address] for contact info under    Signed, [name], PA-C  04/20/2024 3:48 PM  ATTENDING ATTESTATION:  After conducting a review of all available clinical information with the care team, interviewing the patient, and performing a physical exam, I agree with the findings and plan described in this note.   GEN: No acute distress, AO x 3 HEENT:  MMM, no JVD, no scleral icterus, right IJ  Swan in place Cardiac: RRR, 2/6 Benjamin  Respiratory: Clear to auscultation bilaterally. GI: Soft, nontender, non-distended  MS: No edema; No deformity. 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  The patient was in his normal state of health up until about 6 months ago.  At that point in time he developed increasing shortness of breath.  Over the last few weeks this became very severe to the point where he could not do his activities of daily living without being very short of breath.  Because of this he presented to [facility].  He is found to be hypotensive and in acute heart failure.  He was transferred to the to heart unit started on dobutamine and norepinephrine.  He has been medically stabilized.  He underwent right heart catheterization and coronary angiography.  This demonstrated a high-grade distal right coronary artery lesion that is relatively calcified.  His right heart catheterization numbers were preserved while on dobutamine and norepinephrine.  On my review of his records he has diuresed well.  Today he feels well and is without shortness of breath.  In terms of other symptomatology prior to his admission he denied any claudication or exertional angina.  I reviewed the patient's echocardiogram which demonstrates a severe cardiomyopathy and a severely degenerated bioprosthetic aortic valve.  He has a very strong right femoral pulse.  His left femoral pulses +1.  Will obtain a TAVR protocol CTA.  If his valve to coronary distance is reassuring then I think we should proceed to valve in valve TAVR.  It looks like he is doing relatively well so this can likely be done on Tuesday.  Given the fact he does have coronary artery disease though this is asymptomatic we should pursue valve in valve TAVR with a SAPIEN bioprosthesis (likely 26mm).  This would allow easier access to his right coronary artery in case his RCA lesion would need to be addressed in the future.  At this point in time I do not think that it needs to be addressed.  Following valve in valve TAVR we would likely convert the patient to Eliquis.  We will follow along with you with tentative plans to proceed  with valve in valve TAVR on Tuesday as informed by the pending CT scan.  Lurena Red, MD Pager [phone]

## 2024-04-20 NOTE — TOC Initial Note (Signed)
 Transition of Care [facility]) - Initial/Assessment Note    Patient Details  Name: [name] MRN: [id number] Date of Birth: Aug 15, 1955  Transition of Care [facility]) CM/SW Contact:    Justina Delcia Czar, RN Phone Number: [phone] 04/20/2024, 3:19 PM  Clinical Narrative:                 Spoke to pt at bedside. States he was independent pta. Has DME at home. Gave permission to speak to dtr.  Pt will need oxygen at home. The oxygen set up belong to his wife that is currently in home.   Will continue to follow for [facility] needs. Waiting PT/OT recommendations.     Expected Discharge Plan: Home w Home Health Services Barriers to Discharge: Continued Medical Work up   Patient Goals and CMS Choice Patient states their goals for this hospitalization and ongoing recovery are:: wants to recover      Expected Discharge Plan and Services   Discharge Planning Services: CM Consult   Living arrangements for the past 2 months: Single Family Home   Prior Living Arrangements/Services Living arrangements for the past 2 months: Single Family Home Lives with:: Self Patient language and need for interpreter reviewed:: Yes Do you feel safe going back to the place where you live?: Yes      Need for Family Participation in Patient Care: No (Comment) Care giver support system in place?: Yes (comment) Current home services: DME (rolling walker, cane, oxygen (belonged to his wife)) Criminal Activity/Legal Involvement Pertinent to Current Situation/Hospitalization: No - Comment as needed  Activities of Daily Living   ADL Screening (condition at time of admission) Independently performs ADLs?: Yes (appropriate for developmental age) Is the patient deaf or have difficulty hearing?: No Does the patient have difficulty seeing, even when wearing glasses/contacts?: No Does the patient have difficulty concentrating, remembering, or making decisions?: No  Permission Sought/Granted Permission sought to  share information with : Case Manager, Family Supports, PCP Permission granted to share information with : Yes, Verbal Permission Granted  Share Information with NAME: Creighton Craze  Permission granted to share info w AGENCY: PCP, DME, Home Health  Permission granted to share info w Relationship: daughter  Permission granted to share info w Contact Information: [phone]  Emotional Assessment Appearance:: Appears stated age Attitude/Demeanor/Rapport: Engaged Affect (typically observed): Accepting Orientation: : Oriented to Self, Oriented to Place, Oriented to  Time, Oriented to Situation   Psych Involvement: No (comment)  Admission diagnosis:  HFrEF (heart failure with reduced ejection fraction) (HCC) [I50.20] Patient Active Problem List   Diagnosis Date Noted   HFrEF (heart failure with reduced ejection fraction) (HCC) 04/19/2024   Dyslipidemia 01/10/2017   PAD (peripheral artery disease) 11/01/2016   Gas bloat syndrome 10/03/2016   Essential hypertension 10/03/2016   Claudication in peripheral vascular disease 08/28/2016   Typical atrial flutter (HCC) 02/20/2016   S/P AVR (aortic valve replacement) 02/20/2016   Chronic systolic (congestive) heart failure (HCC) 02/20/2016   Long term current use of anticoagulant therapy 02/07/2016   Atrial fibrillation (HCC) 02/02/2016   CAD- minor CAD at cath  01/24/2016   BPH (benign prostatic hyperplasia) 01/24/2016   COPD (chronic obstructive pulmonary disease) (HCC)    Non-ischemic cardiomyopathy (HCC) 01/19/2016   Shortness of breath 01/19/2016   Other fatigue 01/19/2016   Screening for lipid disorders 01/19/2016   MR (mitral regurgitation) 01/19/2016   PCP:  Sabas Norleen PARAS., MD Pharmacy:   [facility] [phone] - [place], [place] - [address] [place] [address] Phone: [phone] Fax: [phone]  [facility] [address], [place] - [address] [facility] [place]  [address] Phone: [phone] Fax: [phone]     Social Drivers of Health (SDOH) Social History: SDOH Screenings   Food Insecurity: No Food Insecurity (04/19/2024)  Housing: Low Risk  (04/19/2024)  Transportation Needs: No Transportation Needs (04/19/2024)  Utilities: Not At Risk (04/19/2024)  Social Connections: Socially Isolated (04/19/2024)  Tobacco Use: Medium Risk (04/20/2024)   SDOH Interventions:     Readmission Risk Interventions     No data to display

## 2024-04-20 NOTE — Progress Notes (Signed)
 PHARMACY - ANTICOAGULATION CONSULT NOTE  Pharmacy Consult for heparin  infusion Indication: atrial fibrillation  Allergies  Allergen Reactions   No Known Allergies    Patient Measurements: Height: 5' 10 (177.8 cm) Weight: 73.8 kg (162 lb 11.2 oz) IBW/kg (Calculated) : 73 HEPARIN  DW (KG): 76.7  Vital Signs: Temp: 99 F (37.2 C) (10/21 1700) Temp Source: Oral (10/21 1150) BP: 102/81 (10/21 1700) Pulse Rate: 83 (10/21 1700)  Labs: Recent Labs    04/19/24 1133 04/19/24 1354 04/19/24 1811 04/20/24 0449 04/20/24 1406  HGB 14.9  --   --  14.8 16.0  HCT 44.1  --   --  44.7 47.0  PLT 133*  --   --  140*  --   CREATININE 0.77  --   --  0.78  --   TROPONINIHS 218* 259* 268*  --   --    Estimated Creatinine Clearance: 88.7 mL/min (by C-G formula based on SCr of 0.78 mg/dL).  Medical History: Past Medical History:  Diagnosis Date   Asthma    CAD (coronary artery disease) 01/23/2016   minor CAD at cath-40% RCA   CHF (congestive heart failure) (HCC)    aortic valve replacement   COPD (chronic obstructive pulmonary disease) (HCC)    denies patient said that a pulmonologist said he said that he didnt have COPD   Dyspnea    Dysrhythmia    hx afib   Heart murmur    had a murmur before the aortic valve replacement, no murmur according to dr. Mona per patient   Hypertension    Non-ischemic cardiomyopathy (HCC) 01/23/2016   EF 30-35%   Pneumonia    Severe aortic stenosis    SOBOE (shortness of breath on exertion)     Assessment: Patient is a 68 YO male presenting to the ED with shortness of breath, increased inhaler use needs, and fatigue. Troponins 218 > 259. Cardiology consulted for evaluation of CHF exacerbation and elevated troponins. PMH significant for atrial fibrillation on Xarelto  PTA, and bioprosthetic AVR. Pharmacy consulted to dose heparin  for atrial fibrillation.   Last dose of Xarelto  on 10/19 at 8pm.  Hgb (14.8) and platelets (140) are stable. Currently has  enoxaparin  40 mg daily ordered for vte prophylaxis. Will start infusion with no bolus and trend aPTTs until correlating with heparin  level given recent Xarelto  use.  PM: Now s/p cath > heparin  to resume 2hr after TR band removed.  Goal of Therapy:  Heparin  level 0.3-0.7 units/ml aPTT 66-102s Monitor platelets by anticoagulation protocol: Yes   Plan:  2hr after TR band off, resume heparin  infusion at 1000 units/h (no bolus).  Check aPTT, heparin  level in 8h.  Monitor aPTT, heparin  level, CBC daily until correlation.  F/u transition to DOAC when no further procedures required  Thank you for allowing pharmacy to be a part of this patient's care.   Rocky Slade, PharmD, BCPS 04/20/2024 5:34 PM   Please check AMION for all [facility] Pharmacy phone numbers After 10:00 PM, call Main Pharmacy [phone]

## 2024-04-20 NOTE — Progress Notes (Signed)
 PHARMACY - ANTICOAGULATION CONSULT NOTE  Pharmacy Consult for heparin  infusion Indication: atrial fibrillation  Allergies  Allergen Reactions   No Known Allergies    Patient Measurements: Height: 5' 10 (177.8 cm) Weight: 73.8 kg (162 lb 11.2 oz) IBW/kg (Calculated) : 73 HEPARIN  DW (KG): 76.7  Vital Signs: Temp: 97.7 F (36.5 C) (10/21 0748) Temp Source: Oral (10/21 0748) BP: 103/70 (10/21 0900) Pulse Rate: 88 (10/21 0900)  Labs: Recent Labs    04/19/24 1133 04/19/24 1354 04/19/24 1811 04/20/24 0449  HGB 14.9  --   --  14.8  HCT 44.1  --   --  44.7  PLT 133*  --   --  140*  CREATININE 0.77  --   --  0.78  TROPONINIHS 218* 259* 268*  --    Estimated Creatinine Clearance: 88.7 mL/min (by C-G formula based on SCr of 0.78 mg/dL).  Medical History: Past Medical History:  Diagnosis Date   Asthma    CAD (coronary artery disease) 01/23/2016   minor CAD at cath-40% RCA   CHF (congestive heart failure) (HCC)    aortic valve replacement   COPD (chronic obstructive pulmonary disease) (HCC)    denies patient said that a pulmonologist said he said that he didnt have COPD   Dyspnea    Dysrhythmia    hx afib   Heart murmur    had a murmur before the aortic valve replacement, no murmur according to dr. Mona per patient   Hypertension    Non-ischemic cardiomyopathy (HCC) 01/23/2016   EF 30-35%   Pneumonia    Severe aortic stenosis    SOBOE (shortness of breath on exertion)     Assessment: Patient is a 68 YO male presenting to the ED with shortness of breath, increased inhaler use needs, and fatigue. Troponins 218 > 259. Cardiology consulted for evaluation of CHF exacerbation and elevated troponins. PMH significant for atrial fibrillation on Xarelto  PTA, and bioprosthetic AVR. Pharmacy consulted to dose heparin  for atrial fibrillation.   Last dose of Xarelto  on 10/19 at 8pm.  Hgb (14.8) and platelets (140) are stable. Currently has enoxaparin  40 mg daily ordered for vte  prophylaxis. Will start infusion with no bolus and trend aPTTs until correlating with heparin  level given recent Xarelto  use.  Goal of Therapy:  Heparin  level 0.3-0.7 units/ml aPTT 66-102s Monitor platelets by anticoagulation protocol: Yes   Plan:  -Start heparin  infusion at 1000 units/h.  -Check initial aPTT, heparin  level in 8h.  -Monitor aPTT, heparin  level, CBC daily.  -F/u transition to DOAC when no further procedures required  Thank you for allowing pharmacy to be a part of this patient's care.   Nidia Schaffer, PharmD PGY2 Cardiology Pharmacy Resident  Please check AMION for all [facility] Pharmacy phone numbers After 10:00 PM, call Main Pharmacy [phone] 04/20/2024 9:58 AM

## 2024-04-20 NOTE — Progress Notes (Addendum)
 Advanced Heart Failure Rounding Note  Cardiologist: Vinie JAYSON Maxcy, MD  AHF: Dr. Cherrie   Chief Complaint: Cardiogenic Shock D/T Severe AoV Stenosis   Patient Profile   68 y/o male with h/o bioprosthetic AVR in 2017, PAF, PAD, COPD and tobacco use, admitted w/ CS in the setting of severe prosthetic aortic valve stenosis.   Admit Echo AoV mean gradient 49 mmHg. LVEF 20-25% (down from 40-45%), RV ok.   Subjective:    Interval Hx: initially placed on low dose DBA started at 2.5 without much benefit. NE started.  Now off DBA. Remains on NE 10. Co-ox 70%. Cuff MAPs 80s.   He feels SOB. + orthopnea, currently sitting w/ HOB elevated. On supp O2 at 4L/min, O2 sats 93%. No supp O2 requirements at baseline. CVP 8-9. CXR this am showed pulmonary edema    He denies CP    Objective:   Weight Range: 73.8 kg Body mass index is 23.34 kg/m.   Vital Signs:   Temp:  [97.7 F (36.5 C)-98.5 F (36.9 C)] 97.7 F (36.5 C) (10/21 0748) Pulse Rate:  [64-94] 82 (10/21 0700) Resp:  [14-35] 26 (10/21 0700) BP: (67-109)/(33-86) 109/69 (10/21 0700) SpO2:  [88 %-97 %] 94 % (10/21 0700) Weight:  [73.8 kg-76.7 kg] 73.8 kg (10/20 2026) Last BM Date :  (PTA)  Weight change: Filed Weights   04/19/24 1120 04/19/24 2026  Weight: 76.7 kg 73.8 kg    Intake/Output:   Intake/Output Summary (Last 24 hours) at 04/20/2024 0901 Last data filed at 04/20/2024 0748 Gross per 24 hour  Intake 268.28 ml  Output 450 ml  Net -181.72 ml      Physical Exam    CVP 8-9  GENERAL: fatigued appearing, NAD Lungs- course b/l + diffuse expiratory wheezing/rhonchi  CARDIAC:  JVP: 8 cm         Normal rate with regular rhythm. Very faint SEM ABDOMEN: Soft, non-tender, non-distended.  EXTREMITIES: Warm and well perfused. + RUE PICC  NEUROLOGIC: No obvious FND   Telemetry   NSR 80s, personally reviewed   EKG    NSR w/ PVCs, LVH 78 bpm, personally reviewed   Labs    CBC Recent Labs     04/19/24 1133 04/20/24 0449  WBC 9.6 8.5  HGB 14.9 14.8  HCT 44.1 44.7  MCV 100.7* 101.8*  PLT 133* 140*   Basic Metabolic Panel Recent Labs    89/79/74 1133 04/20/24 0449  NA 138 135  K 4.4 3.8  CL 105 102  CO2 23 23  GLUCOSE 104* 150*  BUN 20 22  CREATININE 0.77 0.78  CALCIUM  9.1 8.5*   Liver Function Tests Recent Labs    04/19/24 2234 04/20/24 0449  AST 19 21  ALT 18 18  ALKPHOS 37* 40  BILITOT 1.0 1.0  PROT 5.9* 6.1*  ALBUMIN  3.3* 3.2*   No results for input(s): LIPASE, AMYLASE in the last 72 hours. Cardiac Enzymes No results for input(s): CKTOTAL, CKMB, CKMBINDEX, TROPONINI in the last 72 hours.  BNP: BNP (last 3 results) Recent Labs    03/25/24 1359 04/19/24 1133  BNP 1,332.6* 1,400.2*    ProBNP (last 3 results) No results for input(s): PROBNP in the last 8760 hours.   D-Dimer No results for input(s): DDIMER in the last 72 hours. Hemoglobin A1C No results for input(s): HGBA1C in the last 72 hours. Fasting Lipid Panel Recent Labs    04/20/24 0449  CHOL 108  HDL 52  LDLCALC 42  TRIG 68  CHOLHDL 2.1   Thyroid Function Tests No results for input(s): TSH, T4TOTAL, T3FREE, THYROIDAB in the last 72 hours.  Invalid input(s): FREET3  Other results:   Imaging    US  EKG SITE RITE Result Date: 04/19/2024 If Site Rite image not attached, placement could not be confirmed due to current cardiac rhythm.  ECHOCARDIOGRAM COMPLETE Result Date: 04/19/2024    ECHOCARDIOGRAM REPORT   Patient Name:   [name] Date of Exam: 04/19/2024 Medical Rec #:  [id number]       Height:       70.0 in Accession #:    [id number]      Weight:       169.0 lb Date of Birth:  [birth year]       BSA:          1.943 m Patient Age:    68 years        BP:           78/59 mmHg Patient Gender: M               HR:           71 bpm. Exam Location:  Inpatient Procedure: 2D Echo, Cardiac Doppler and Color Doppler (Both Spectral and Color             Flow Doppler were utilized during procedure). Indications:    CHF-Acute Systolic I50.21  History:        Patient has prior history of Echocardiogram examinations, most                 recent 10/22/2023. CHF and Cardiomyopathy, PAD and COPD,                 Arrythmias:Atrial Fibrillation and Atrial Flutter,                 Signs/Symptoms:Shortness of Breath; Risk Factors:Dyslipidemia.                 Aortic Valve: 25 mm Edwards valve is present in the aortic                 position. Procedure Date: 2017.  Sonographer:    Thea Norlander RCS Referring Phys: [id number] SHENG L HALEY IMPRESSIONS  1. Left ventricular ejection fraction, by estimation, is 20 to 25%. The left ventricle has severely decreased function. The left ventricle demonstrates regional wall motion abnormalities (see scoring diagram/findings for description). The left ventricular internal cavity size was moderately to severely dilated. There is mild concentric left ventricular hypertrophy. Left ventricular diastolic parameters are indeterminate.  2. Right ventricular systolic function is normal. The right ventricular size is normal. Tricuspid regurgitation signal is inadequate for assessing PA pressure.  3. Left atrial size was severely dilated.  4. Right atrial size was mild to moderately dilated.  5. The mitral valve is degenerative. Moderate to severe mitral valve regurgitation. No evidence of mitral stenosis.  6. 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 Details below.  7. The aortic valve was not well visualized. Aortic valve regurgitation is mild to moderate. There is a 25 mm Edwards valve present in the aortic position. Procedure Date: 2017. Echo findings are consistent with stenosis of the aortic prosthesis. Aortic  valve area, by VTI measures 0.61 cm. Aortic valve mean gradient measures 49.0 mmHg. Aortic valve Vmax measures 4.72 m/s. Aortic valve acceleration time measures 169 msec.  8. Aortic dilatation noted. There is  borderline dilatation of the aortic root,  measuring 37 mm.  9. The inferior vena cava is normal in size with greater than 50% respiratory variability, suggesting right atrial pressure of 3 mmHg. 10. Cannot exclude a small PFO. Comparison(s): A prior study was performed on 10/22/2023. The estimated ejection fraction was 45-50% with hypokinesis of inferior and posterior walls. There was grade II diastolic dysfunction with elevated LAP. There was moderate biatrial enlargement. 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 FINDINGS  Left Ventricle: Left ventricular ejection fraction, by estimation, is 20 to 25%. The left ventricle has severely decreased function. The left ventricle demonstrates regional wall motion abnormalities. The left ventricular internal cavity size was moderately to severely dilated. There is mild concentric left ventricular hypertrophy. Left ventricular diastolic parameters are indeterminate.  LV Wall Scoring: The entire inferior wall and posterior wall are hypokinetic. Right Ventricle: The right ventricular size is normal. No increase in right ventricular wall thickness. Right ventricular systolic function is normal. Tricuspid regurgitation signal is inadequate for assessing PA pressure. Left Atrium: Left atrial size was severely dilated. Right Atrium: Right atrial size was mild to moderately dilated. Pericardium: There is no evidence of pericardial effusion. Mitral Valve: The mitral valve is degenerative in appearance. Mild mitral annular calcification. Moderate to severe mitral valve regurgitation. No evidence of mitral valve stenosis. Tricuspid Valve: The tricuspid valve is normal in structure. Tricuspid valve regurgitation is trivial. No evidence of tricuspid stenosis. Aortic Valve: The aortic valve was not well visualized. Aortic valve regurgitation is mild to moderate. Aortic valve mean gradient measures 49.0 mmHg. Aortic valve peak  gradient measures 88.9 mmHg. Aortic valve area, by VTI measures 0.61 cm. There is a 25 mm Edwards valve present in the aortic position. Procedure Date: 2017. Pulmonic Valve: The pulmonic valve was normal in structure. Pulmonic valve regurgitation is trivial. No evidence of pulmonic stenosis. Aorta: Aortic dilatation noted. There is borderline dilatation of the aortic root, measuring 37 mm. Venous: The inferior vena cava is normal in size with greater than 50% respiratory variability, suggesting right atrial pressure of 3 mmHg. IAS/Shunts: Cannot exclude a small PFO. 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 Details below.  LEFT VENTRICLE PLAX 2D LVIDd:         6.60 cm   Diastology LVIDs:         5.30 cm   LV e' medial:    4.03 cm/s LV PW:         1.10 cm   LV E/e' medial:  30.5 LV IVS:        1.10 cm   LV e' lateral:   9.14 cm/s LVOT diam:     2.30 cm   LV E/e' lateral: 13.5 LV SV:         68 LV SV Index:   35 LVOT Area:     4.15 cm  RIGHT VENTRICLE             IVC RV S prime:     11.70 cm/s  IVC diam: 1.90 cm TAPSE (M-mode): 2.2 cm LEFT ATRIUM              Index        RIGHT ATRIUM           Index LA diam:        4.10 cm  2.11 cm/m   RA Area:     21.40 cm LA Vol (A2C):   104.0 ml 53.52 ml/m  RA Volume:   62.70 ml  32.27 ml/m LA Vol (A4C):   106.0 ml 54.55 ml/m LA Biplane Vol: 106.0 ml 54.55 ml/m  AORTIC VALVE AV Area (Vmax):    0.60 cm AV Area (Vmean):   0.60 cm AV Area (VTI):     0.61 cm AV Vmax:           471.50 cm/s AV Vmean:          323.000 cm/s AV VTI:            1.115 m AV Peak Grad:      88.9 mmHg AV Mean Grad:      49.0 mmHg LVOT Vmax:         68.00 cm/s LVOT Vmean:        46.900 cm/s LVOT VTI:          0.164 m LVOT/AV VTI ratio: 0.15  AORTA Ao Root diam: 3.70 cm Ao Asc diam:  3.50 cm MITRAL VALVE MV Area (PHT): 5.02 cm     SHUNTS MV Decel Time: 151 msec     Systemic VTI:  0.16 m MV E velocity: 123.00 cm/s  Systemic Diam: 2.30 cm Emeline Calender  Electronically signed by Emeline Calender Signature Date/Time: 04/19/2024/7:22:45 PM    Final    DG Chest 2 View Result Date: 04/19/2024 EXAM: 2 VIEW(S) XRAY OF THE CHEST 04/19/2024 12:14:00 PM COMPARISON: 08/26/2023 CLINICAL HISTORY: shob. SOB FINDINGS: LUNGS AND PLEURA: Chronic coarsened interstitial markings with superimposed increased interstitial markings identified. Bibasilar opacities are new from the previous exam and may reflect atelectasis or airspace disease. No pulmonary edema. No pleural effusion. No pneumothorax. HEART AND MEDIASTINUM: Status post median sternotomy. Prosthetic aortic valve. Aortic atherosclerotic calcification. BONES AND SOFT TISSUES: No acute osseous abnormality. IMPRESSION: 1. New bilateral small pleural effusions with mild interstitial edema concerning for chf. 2. New bibasilar opacities, possibly representing atelectasis or airspace disease. 3. Aortic atherosclerotic calcification. Electronically signed by: Waddell Calk MD 04/19/2024 01:38 PM EDT RP Workstation: [id number]     Medications:     Scheduled Medications:  aspirin  EC  81 mg Oral Daily   atorvastatin   10 mg Oral Daily   Chlorhexidine  Gluconate Cloth  6 each Topical Daily   enoxaparin  (LOVENOX ) injection  40 mg Subcutaneous Q24H   sodium chloride  flush  10-40 mL Intracatheter Q12H    Infusions:  sodium chloride  Stopped (04/19/24 2206)   norepinephrine (LEVOPHED) Adult infusion 10 mcg/min (04/20/24 0700)    PRN Medications: acetaminophen , ondansetron  (ZOFRAN ) IV, mouth rinse, sodium chloride  flush     Assessment/Plan   1. Acute Systolic Heart Failure w/ SCAI Stage B Cardiogenic Shock  - Echo EF 20-25% (down from 45-50% in 4/25. Severe prosthetic AS mean grdinet - hypotensive on admit. LA level normal. Initially placed on low dose DBA at 2.5 without much benefit. NE started - now off DBA. On NE 10, Cuff MAPs in the 80s, Co-ox 70%  - NYHA Class IIIb, CXR w/ pulmonary edema, CVP 9. Give  80 mg IV lasix  x1 and follow response - no GDMT currently given hypotension requiring NE support  - plan R/LHC after diuresis as part of TAVR w/u   2. Severe Prosthetic AoV Stenosis  - h/o bioprosthetic SAVR in 2017  - Echo on admit w/ severe AS, mean gradient 49 mmHg - Structural Heart team consulted for TAVR eval   3. CAD w/ Elevated HS Trop  - LHC in 2017 10% pRCA, 40% mRCA  - denies CP  -  Hs trop this admit [id number]>>[id number] c/w demand ischemia - will need repeat LHC as part of TAVR w/u  - continue ASA + statin   4. Tobacco Abuse - smokes 1ppd - advised to quit   5. PAF - NSR on tele - on Xarelto  PTA, cover w/ heparin  gtt, anticipate need for invasive procedures  6. Poor Sleep - start PRN trazodone    Length of Stay: 1  Brittainy Simmons, PA-C  04/20/2024, 9:01 AM  Advanced Heart Failure Team Pager [phone] (M-F; 7a - 5p)  Please contact [facility] for night-coverage after hours (5p -7a ) and weekends on [email or web address]  Patient seen with PA, I formulated the plan and agree with the above note.   Today, he is on norepinephrine 10 with co-ox 70%.  Creatinine 0.78.  CVP 6 for me but short of breath with pulmonary edema on CXR.   He does, however, feel better than yesterday.  BP stable.   General: NAD Neck: No JVD, no thyromegaly or thyroid nodule.  Lungs: Rhonchi and crackles bilaterally.  CV: Nondisplaced PMI.  Heart regular S1/S2, no S3/S4, 2/6 SEM RUSB.  Trace ankle edema. No carotid bruit.  Difficult to palpate pedal pulses.  Abdomen: Soft, nontender, no hepatosplenomegaly, no distention.  Skin: Intact without lesions or rashes.  Neurologic: Alert and oriented x 3.  Psych: Normal affect. Extremities: No clubbing or cyanosis.  HEENT: Normal.   1. Acute on chronic systolic CHF/cardiogenic shock: Echo this admission with EF 20-25%, moderate LV dilation,  RV normal, moderate-severe MR, bioprosthetic aortic valve with severe stenosis and mean gradient 49 with  mild-moderate AI, IVC not dilated.  Prior echo had shown EF 45-50%.  In the past, EF dropped with severe AS.  Suspect this is the cause again.  LHC in 2017 showed nonobstructive CAD.  He is now on NE 10 with co-ox 70%.  Though he has ongoing dyspnea, crackles on lungs, and pulmonary edema, CVP is only 6.  I suspect, however, that PCWP is high.   - Start dobutamine 2.5 mcg/kg/min again and will try to decrease NE.  - Lasix  80 mg IV x 1.  - RHC/LHC today, will leave in [place].  Discussed risks/benefits with patient and he agrees to procedure.  - Ultimately, he will need TAVR.  Will ask for structural heart team to consult.  2. Aortic stenosis: Severe bioprosthetic AS with mild-moderate AI.   I reviewed echo.  He initially had AVR for bicuspid aortic valve.  Needs evaluation for TAVR, will need to stabilize first.  He has significant PAD which may be a problem for TAVR.  3. CAD: No chest pain, minimally elevated troponin with no trend.  Suspect demand ischemia from shock/CHF.  - Coronary angiography today as above.  - Continue statin.  4. Atrial fibrillation: Paroxysmal.  He is in NSR here.  - Continue heparin  gtt.  5. PAD: History of peripheral intervention in 2018.  Will need pre-TAVR peripheral vascular evaluation.  6. Smoking/suspected COPD: Needs to quit.    CRITICAL CARE Performed by: Ezra Shuck  Total critical care time: 50 minutes  Critical care time was exclusive of separately billable procedures and treating other patients.  Critical care was necessary to treat or prevent imminent or life-threatening deterioration.  Critical care was time spent personally by me on the following activities: development of treatment plan with patient and/or surrogate as well as nursing, discussions with consultants, evaluation of patient's response to treatment, examination of patient, obtaining history from patient or surrogate,  ordering and performing treatments and interventions, ordering and review  of laboratory studies, ordering and review of radiographic studies, pulse oximetry and re-evaluation of patient's condition.  Ezra Shuck 04/20/2024 11:30 AM

## 2024-04-20 NOTE — H&P (View-Only) (Signed)
 Advanced Heart Failure Rounding Note  Cardiologist: Vinie JAYSON Maxcy, MD  AHF: Dr. Cherrie   Chief Complaint: Cardiogenic Shock D/T Severe AoV Stenosis   Patient Profile   68 y/o male with h/o bioprosthetic AVR in 2017, PAF, PAD, COPD and tobacco use, admitted w/ CS in the setting of severe prosthetic aortic valve stenosis.   Admit Echo AoV mean gradient 49 mmHg. LVEF 20-25% (down from 40-45%), RV ok.   Subjective:    Interval Hx: initially placed on low dose DBA started at 2.5 without much benefit. NE started.  Now off DBA. Remains on NE 10. Co-ox 70%. Cuff MAPs 80s.   He feels SOB. + orthopnea, currently sitting w/ HOB elevated. On supp O2 at 4L/min, O2 sats 93%. No supp O2 requirements at baseline. CVP 8-9. CXR this am showed pulmonary edema    He denies CP    Objective:   Weight Range: 73.8 kg Body mass index is 23.34 kg/m.   Vital Signs:   Temp:  [97.7 F (36.5 C)-98.5 F (36.9 C)] 97.7 F (36.5 C) (10/21 0748) Pulse Rate:  [64-94] 82 (10/21 0700) Resp:  [14-35] 26 (10/21 0700) BP: (67-109)/(33-86) 109/69 (10/21 0700) SpO2:  [88 %-97 %] 94 % (10/21 0700) Weight:  [73.8 kg-76.7 kg] 73.8 kg (10/20 2026) Last BM Date :  (PTA)  Weight change: Filed Weights   04/19/24 1120 04/19/24 2026  Weight: 76.7 kg 73.8 kg    Intake/Output:   Intake/Output Summary (Last 24 hours) at 04/20/2024 0901 Last data filed at 04/20/2024 0748 Gross per 24 hour  Intake 268.28 ml  Output 450 ml  Net -181.72 ml      Physical Exam    CVP 8-9  GENERAL: fatigued appearing, NAD Lungs- course b/l + diffuse expiratory wheezing/rhonchi  CARDIAC:  JVP: 8 cm         Normal rate with regular rhythm. Very faint SEM ABDOMEN: Soft, non-tender, non-distended.  EXTREMITIES: Warm and well perfused. + RUE PICC  NEUROLOGIC: No obvious FND   Telemetry   NSR 80s, personally reviewed   EKG    NSR w/ PVCs, LVH 78 bpm, personally reviewed   Labs    CBC Recent Labs     04/19/24 1133 04/20/24 0449  WBC 9.6 8.5  HGB 14.9 14.8  HCT 44.1 44.7  MCV 100.7* 101.8*  PLT 133* 140*   Basic Metabolic Panel Recent Labs    89/79/74 1133 04/20/24 0449  NA 138 135  K 4.4 3.8  CL 105 102  CO2 23 23  GLUCOSE 104* 150*  BUN 20 22  CREATININE 0.77 0.78  CALCIUM  9.1 8.5*   Liver Function Tests Recent Labs    04/19/24 2234 04/20/24 0449  AST 19 21  ALT 18 18  ALKPHOS 37* 40  BILITOT 1.0 1.0  PROT 5.9* 6.1*  ALBUMIN  3.3* 3.2*   No results for input(s): LIPASE, AMYLASE in the last 72 hours. Cardiac Enzymes No results for input(s): CKTOTAL, CKMB, CKMBINDEX, TROPONINI in the last 72 hours.  BNP: BNP (last 3 results) Recent Labs    03/25/24 1359 04/19/24 1133  BNP 1,332.6* 1,400.2*    ProBNP (last 3 results) No results for input(s): PROBNP in the last 8760 hours.   D-Dimer No results for input(s): DDIMER in the last 72 hours. Hemoglobin A1C No results for input(s): HGBA1C in the last 72 hours. Fasting Lipid Panel Recent Labs    04/20/24 0449  CHOL 108  HDL 52  LDLCALC 42  TRIG 68  CHOLHDL 2.1   Thyroid Function Tests No results for input(s): TSH, T4TOTAL, T3FREE, THYROIDAB in the last 72 hours.  Invalid input(s): FREET3  Other results:   Imaging    US  EKG SITE RITE Result Date: 04/19/2024 If Site Rite image not attached, placement could not be confirmed due to current cardiac rhythm.  ECHOCARDIOGRAM COMPLETE Result Date: 04/19/2024    ECHOCARDIOGRAM REPORT   Patient Name:   [name] Date of Exam: 04/19/2024 Medical Rec #:  [id number]       Height:       70.0 in Accession #:    [id number]      Weight:       169.0 lb Date of Birth:  [birth year]       BSA:          1.943 m Patient Age:    68 years        BP:           78/59 mmHg Patient Gender: M               HR:           71 bpm. Exam Location:  Inpatient Procedure: 2D Echo, Cardiac Doppler and Color Doppler (Both Spectral and Color             Flow Doppler were utilized during procedure). Indications:    CHF-Acute Systolic I50.21  History:        Patient has prior history of Echocardiogram examinations, most                 recent 10/22/2023. CHF and Cardiomyopathy, PAD and COPD,                 Arrythmias:Atrial Fibrillation and Atrial Flutter,                 Signs/Symptoms:Shortness of Breath; Risk Factors:Dyslipidemia.                 Aortic Valve: 25 mm Edwards valve is present in the aortic                 position. Procedure Date: 2017.  Sonographer:    Thea Norlander RCS Referring Phys: [id number] SHENG L HALEY IMPRESSIONS  1. Left ventricular ejection fraction, by estimation, is 20 to 25%. The left ventricle has severely decreased function. The left ventricle demonstrates regional wall motion abnormalities (see scoring diagram/findings for description). The left ventricular internal cavity size was moderately to severely dilated. There is mild concentric left ventricular hypertrophy. Left ventricular diastolic parameters are indeterminate.  2. Right ventricular systolic function is normal. The right ventricular size is normal. Tricuspid regurgitation signal is inadequate for assessing PA pressure.  3. Left atrial size was severely dilated.  4. Right atrial size was mild to moderately dilated.  5. The mitral valve is degenerative. Moderate to severe mitral valve regurgitation. No evidence of mitral stenosis.  6. 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 Details below.  7. The aortic valve was not well visualized. Aortic valve regurgitation is mild to moderate. There is a 25 mm Edwards valve present in the aortic position. Procedure Date: 2017. Echo findings are consistent with stenosis of the aortic prosthesis. Aortic  valve area, by VTI measures 0.61 cm. Aortic valve mean gradient measures 49.0 mmHg. Aortic valve Vmax measures 4.72 m/s. Aortic valve acceleration time measures 169 msec.  8. Aortic dilatation noted. There is  borderline dilatation of the aortic root,  measuring 37 mm.  9. The inferior vena cava is normal in size with greater than 50% respiratory variability, suggesting right atrial pressure of 3 mmHg. 10. Cannot exclude a small PFO. Comparison(s): A prior study was performed on 10/22/2023. The estimated ejection fraction was 45-50% with hypokinesis of inferior and posterior walls. There was grade II diastolic dysfunction with elevated LAP. There was moderate biatrial enlargement. 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 FINDINGS  Left Ventricle: Left ventricular ejection fraction, by estimation, is 20 to 25%. The left ventricle has severely decreased function. The left ventricle demonstrates regional wall motion abnormalities. The left ventricular internal cavity size was moderately to severely dilated. There is mild concentric left ventricular hypertrophy. Left ventricular diastolic parameters are indeterminate.  LV Wall Scoring: The entire inferior wall and posterior wall are hypokinetic. Right Ventricle: The right ventricular size is normal. No increase in right ventricular wall thickness. Right ventricular systolic function is normal. Tricuspid regurgitation signal is inadequate for assessing PA pressure. Left Atrium: Left atrial size was severely dilated. Right Atrium: Right atrial size was mild to moderately dilated. Pericardium: There is no evidence of pericardial effusion. Mitral Valve: The mitral valve is degenerative in appearance. Mild mitral annular calcification. Moderate to severe mitral valve regurgitation. No evidence of mitral valve stenosis. Tricuspid Valve: The tricuspid valve is normal in structure. Tricuspid valve regurgitation is trivial. No evidence of tricuspid stenosis. Aortic Valve: The aortic valve was not well visualized. Aortic valve regurgitation is mild to moderate. Aortic valve mean gradient measures 49.0 mmHg. Aortic valve peak  gradient measures 88.9 mmHg. Aortic valve area, by VTI measures 0.61 cm. There is a 25 mm Edwards valve present in the aortic position. Procedure Date: 2017. Pulmonic Valve: The pulmonic valve was normal in structure. Pulmonic valve regurgitation is trivial. No evidence of pulmonic stenosis. Aorta: Aortic dilatation noted. There is borderline dilatation of the aortic root, measuring 37 mm. Venous: The inferior vena cava is normal in size with greater than 50% respiratory variability, suggesting right atrial pressure of 3 mmHg. IAS/Shunts: Cannot exclude a small PFO. 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 Details below.  LEFT VENTRICLE PLAX 2D LVIDd:         6.60 cm   Diastology LVIDs:         5.30 cm   LV e' medial:    4.03 cm/s LV PW:         1.10 cm   LV E/e' medial:  30.5 LV IVS:        1.10 cm   LV e' lateral:   9.14 cm/s LVOT diam:     2.30 cm   LV E/e' lateral: 13.5 LV SV:         68 LV SV Index:   35 LVOT Area:     4.15 cm  RIGHT VENTRICLE             IVC RV S prime:     11.70 cm/s  IVC diam: 1.90 cm TAPSE (M-mode): 2.2 cm LEFT ATRIUM              Index        RIGHT ATRIUM           Index LA diam:        4.10 cm  2.11 cm/m   RA Area:     21.40 cm LA Vol (A2C):   104.0 ml 53.52 ml/m  RA Volume:   62.70 ml  32.27 ml/m LA Vol (A4C):   106.0 ml 54.55 ml/m LA Biplane Vol: 106.0 ml 54.55 ml/m  AORTIC VALVE AV Area (Vmax):    0.60 cm AV Area (Vmean):   0.60 cm AV Area (VTI):     0.61 cm AV Vmax:           471.50 cm/s AV Vmean:          323.000 cm/s AV VTI:            1.115 m AV Peak Grad:      88.9 mmHg AV Mean Grad:      49.0 mmHg LVOT Vmax:         68.00 cm/s LVOT Vmean:        46.900 cm/s LVOT VTI:          0.164 m LVOT/AV VTI ratio: 0.15  AORTA Ao Root diam: 3.70 cm Ao Asc diam:  3.50 cm MITRAL VALVE MV Area (PHT): 5.02 cm     SHUNTS MV Decel Time: 151 msec     Systemic VTI:  0.16 m MV E velocity: 123.00 cm/s  Systemic Diam: 2.30 cm Emeline Calender  Electronically signed by Emeline Calender Signature Date/Time: 04/19/2024/7:22:45 PM    Final    DG Chest 2 View Result Date: 04/19/2024 EXAM: 2 VIEW(S) XRAY OF THE CHEST 04/19/2024 12:14:00 PM COMPARISON: 08/26/2023 CLINICAL HISTORY: shob. SOB FINDINGS: LUNGS AND PLEURA: Chronic coarsened interstitial markings with superimposed increased interstitial markings identified. Bibasilar opacities are new from the previous exam and may reflect atelectasis or airspace disease. No pulmonary edema. No pleural effusion. No pneumothorax. HEART AND MEDIASTINUM: Status post median sternotomy. Prosthetic aortic valve. Aortic atherosclerotic calcification. BONES AND SOFT TISSUES: No acute osseous abnormality. IMPRESSION: 1. New bilateral small pleural effusions with mild interstitial edema concerning for chf. 2. New bibasilar opacities, possibly representing atelectasis or airspace disease. 3. Aortic atherosclerotic calcification. Electronically signed by: Waddell Calk MD 04/19/2024 01:38 PM EDT RP Workstation: [id number]     Medications:     Scheduled Medications:  aspirin  EC  81 mg Oral Daily   atorvastatin   10 mg Oral Daily   Chlorhexidine  Gluconate Cloth  6 each Topical Daily   enoxaparin  (LOVENOX ) injection  40 mg Subcutaneous Q24H   sodium chloride  flush  10-40 mL Intracatheter Q12H    Infusions:  sodium chloride  Stopped (04/19/24 2206)   norepinephrine (LEVOPHED) Adult infusion 10 mcg/min (04/20/24 0700)    PRN Medications: acetaminophen , ondansetron  (ZOFRAN ) IV, mouth rinse, sodium chloride  flush     Assessment/Plan   1. Acute Systolic Heart Failure w/ SCAI Stage B Cardiogenic Shock  - Echo EF 20-25% (down from 45-50% in 4/25. Severe prosthetic AS mean grdinet - hypotensive on admit. LA level normal. Initially placed on low dose DBA at 2.5 without much benefit. NE started - now off DBA. On NE 10, Cuff MAPs in the 80s, Co-ox 70%  - NYHA Class IIIb, CXR w/ pulmonary edema, CVP 9. Give  80 mg IV lasix  x1 and follow response - no GDMT currently given hypotension requiring NE support  - plan R/LHC after diuresis as part of TAVR w/u   2. Severe Prosthetic AoV Stenosis  - h/o bioprosthetic SAVR in 2017  - Echo on admit w/ severe AS, mean gradient 49 mmHg - Structural Heart team consulted for TAVR eval   3. CAD w/ Elevated HS Trop  - LHC in 2017 10% pRCA, 40% mRCA  - denies CP  -  Hs trop this admit 218>>259>>268 c/w demand ischemia - will need repeat LHC as part of TAVR w/u  - continue ASA + statin   4. Tobacco Abuse - smokes 1ppd - advised to quit   5. PAF - NSR on tele - on Xarelto  PTA, cover w/ heparin  gtt, anticipate need for invasive procedures  6. Poor Sleep - start PRN trazodone    Length of Stay: 1  Brittainy Simmons, PA-C  04/20/2024, 9:01 AM  Advanced Heart Failure Team Pager [phone] (M-F; 7a - 5p)  Please contact [facility] for night-coverage after hours (5p -7a ) and weekends on [email or web address]  Patient seen with PA, I formulated the plan and agree with the above note.   Today, he is on norepinephrine 10 with co-ox 70%.  Creatinine 0.78.  CVP 6 for me but short of breath with pulmonary edema on CXR.   He does, however, feel better than yesterday.  BP stable.   General: NAD Neck: No JVD, no thyromegaly or thyroid nodule.  Lungs: Rhonchi and crackles bilaterally.  CV: Nondisplaced PMI.  Heart regular S1/S2, no S3/S4, 2/6 SEM RUSB.  Trace ankle edema. No carotid bruit.  Difficult to palpate pedal pulses.  Abdomen: Soft, nontender, no hepatosplenomegaly, no distention.  Skin: Intact without lesions or rashes.  Neurologic: Alert and oriented x 3.  Psych: Normal affect. Extremities: No clubbing or cyanosis.  HEENT: Normal.   1. Acute on chronic systolic CHF/cardiogenic shock: Echo this admission with EF 20-25%, moderate LV dilation,  RV normal, moderate-severe MR, bioprosthetic aortic valve with severe stenosis and mean gradient 49 with  mild-moderate AI, IVC not dilated.  Prior echo had shown EF 45-50%.  In the past, EF dropped with severe AS.  Suspect this is the cause again.  LHC in 2017 showed nonobstructive CAD.  He is now on NE 10 with co-ox 70%.  Though he has ongoing dyspnea, crackles on lungs, and pulmonary edema, CVP is only 6.  I suspect, however, that PCWP is high.   - Start dobutamine 2.5 mcg/kg/min again and will try to decrease NE.  - Lasix  80 mg IV x 1.  - RHC/LHC today, will leave in [place].  Discussed risks/benefits with patient and he agrees to procedure.  - Ultimately, he will need TAVR.  Will ask for structural heart team to consult.  2. Aortic stenosis: Severe bioprosthetic AS with mild-moderate AI.   I reviewed echo.  He initially had AVR for bicuspid aortic valve.  Needs evaluation for TAVR, will need to stabilize first.  He has significant PAD which may be a problem for TAVR.  3. CAD: No chest pain, minimally elevated troponin with no trend.  Suspect demand ischemia from shock/CHF.  - Coronary angiography today as above.  - Continue statin.  4. Atrial fibrillation: Paroxysmal.  He is in NSR here.  - Continue heparin  gtt.  5. PAD: History of peripheral intervention in 2018.  Will need pre-TAVR peripheral vascular evaluation.  6. Smoking/suspected COPD: Needs to quit.    CRITICAL CARE Performed by: Ezra Shuck  Total critical care time: 50 minutes  Critical care time was exclusive of separately billable procedures and treating other patients.  Critical care was necessary to treat or prevent imminent or life-threatening deterioration.  Critical care was time spent personally by me on the following activities: development of treatment plan with patient and/or surrogate as well as nursing, discussions with consultants, evaluation of patient's response to treatment, examination of patient, obtaining history from patient or surrogate,  ordering and performing treatments and interventions, ordering and review  of laboratory studies, ordering and review of radiographic studies, pulse oximetry and re-evaluation of patient's condition.  Ezra Shuck 04/20/2024 11:30 AM

## 2024-04-20 NOTE — Interval H&P Note (Signed)
 History and Physical Interval Note:  04/20/2024 1:42 PM  [name]  has presented today for surgery, with the diagnosis of aortic stenosis.  The various methods of treatment have been discussed with the patient and family. After consideration of risks, benefits and other options for treatment, the patient has consented to  Procedure(s): RIGHT/LEFT HEART CATH AND CORONARY ANGIOGRAPHY (N/A) as a surgical intervention.  The patient's history has been reviewed, patient examined, no change in status, stable for surgery.  I have reviewed the patient's chart and labs.  Questions were answered to the patient's satisfaction.     Chanti Golubski [employer]

## 2024-04-21 ENCOUNTER — Inpatient Hospital Stay (HOSPITAL_COMMUNITY)

## 2024-04-21 ENCOUNTER — Encounter (HOSPITAL_COMMUNITY): Payer: Self-pay | Admitting: Cardiology

## 2024-04-21 DIAGNOSIS — I2510 Atherosclerotic heart disease of native coronary artery without angina pectoris: Secondary | ICD-10-CM | POA: Diagnosis not present

## 2024-04-21 DIAGNOSIS — I5023 Acute on chronic systolic (congestive) heart failure: Secondary | ICD-10-CM | POA: Diagnosis not present

## 2024-04-21 DIAGNOSIS — R570 Cardiogenic shock: Secondary | ICD-10-CM | POA: Diagnosis not present

## 2024-04-21 DIAGNOSIS — I350 Nonrheumatic aortic (valve) stenosis: Secondary | ICD-10-CM

## 2024-04-21 LAB — CBC
HCT: 42.5 % (ref 39.0–52.0)
Hemoglobin: 14.4 g/dL (ref 13.0–17.0)
MCH: 33.7 pg (ref 26.0–34.0)
MCHC: 33.9 g/dL (ref 30.0–36.0)
MCV: 99.5 fL (ref 80.0–100.0)
Platelets: 123 K/uL — ABNORMAL LOW (ref 150–400)
RBC: 4.27 MIL/uL (ref 4.22–5.81)
RDW: 14.3 % (ref 11.5–15.5)
WBC: 7.1 K/uL (ref 4.0–10.5)
nRBC: 0 % (ref 0.0–0.2)

## 2024-04-21 LAB — COMPREHENSIVE METABOLIC PANEL WITH GFR
ALT: 17 U/L (ref 0–44)
AST: 18 U/L (ref 15–41)
Albumin: 3.1 g/dL — ABNORMAL LOW (ref 3.5–5.0)
Alkaline Phosphatase: 37 U/L — ABNORMAL LOW (ref 38–126)
Anion gap: 8 (ref 5–15)
BUN: 21 mg/dL (ref 8–23)
CO2: 22 mmol/L (ref 22–32)
Calcium: 8.3 mg/dL — ABNORMAL LOW (ref 8.9–10.3)
Chloride: 103 mmol/L (ref 98–111)
Creatinine, Ser: 0.68 mg/dL (ref 0.61–1.24)
GFR, Estimated: >60 mL/min (ref >60)
Glucose, Bld: 178 mg/dL — ABNORMAL HIGH (ref 70–99)
Potassium: 3.8 mmol/L (ref 3.5–5.1)
Sodium: 133 mmol/L — ABNORMAL LOW (ref 135–145)
Total Bilirubin: 1.1 mg/dL (ref 0.0–1.2)
Total Protein: 5.9 g/dL — ABNORMAL LOW (ref 6.5–8.1)

## 2024-04-21 LAB — GLUCOSE, CAPILLARY
Glucose-Capillary: 144 mg/dL — ABNORMAL HIGH (ref 70–99)
Glucose-Capillary: 157 mg/dL — ABNORMAL HIGH (ref 70–99)
Glucose-Capillary: 133 mg/dL — ABNORMAL HIGH (ref 70–99)

## 2024-04-21 LAB — COOXEMETRY PANEL
Carboxyhemoglobin: 1.9 % — ABNORMAL HIGH (ref 0.5–1.5)
Methemoglobin: 1.4 % (ref 0.0–1.5)
O2 Saturation: 73.5 %
Total hemoglobin: 15 g/dL (ref 12.0–16.0)

## 2024-04-21 LAB — APTT
aPTT: 42 s — ABNORMAL HIGH (ref 24–36)
aPTT: 40 s — ABNORMAL HIGH (ref 24–36)

## 2024-04-21 LAB — MAGNESIUM: Magnesium: 1.9 mg/dL (ref 1.7–2.4)

## 2024-04-21 LAB — HEPARIN LEVEL (UNFRACTIONATED): Heparin Unfractionated: 0.8 [IU]/mL — ABNORMAL HIGH (ref 0.30–0.70)

## 2024-04-21 LAB — PSA: Prostatic Specific Antigen: 1.9 ng/mL (ref 0.00–4.00)

## 2024-04-21 MED ORDER — MAGNESIUM SULFATE 2 GM/50ML IV SOLN
2.0000 g | Freq: Once | INTRAVENOUS | Status: AC
  Administered 2024-04-21: 2 g via INTRAVENOUS
  Filled 2024-04-21: qty 50

## 2024-04-21 MED ORDER — IOHEXOL 350 MG/ML SOLN
100.0000 mL | Freq: Once | INTRAVENOUS | Status: AC | PRN
  Administered 2024-04-21: 100 mL via INTRAVENOUS

## 2024-04-21 MED ORDER — POTASSIUM CHLORIDE CRYS ER 20 MEQ PO TBCR
40.0000 meq | EXTENDED_RELEASE_TABLET | Freq: Once | ORAL | Status: AC
  Administered 2024-04-21: 40 meq via ORAL
  Filled 2024-04-21: qty 2

## 2024-04-21 MED ORDER — TORSEMIDE 20 MG PO TABS
20.0000 mg | ORAL_TABLET | Freq: Every day | ORAL | Status: DC
  Administered 2024-04-21 – 2024-04-22 (×2): 20 mg via ORAL
  Filled 2024-04-21 (×2): qty 1

## 2024-04-21 NOTE — Progress Notes (Signed)
 Patient ID: [name], male   DOB: 1955-12-27, 68 y.o.   MRN: [id number]  [name] appeared deep on CXR and CT, retracted and will repeat CXR.   CTs done today for TAVR.  Critical stenosis left CIA, at least moderate stenosis right CIA.  I suspect patient will not have good access for transfemoral TAVR, further discussion with structural heart team about alternative access tomorrow.   Spiculated left apical pulmonary nodule may be bronchogenic carcinoma.  Reported to patient, will plan evaluation by PET-CT as outpatient.   Enlarged prostate, sending PSA and will need followup with urology as outpatient (discussed with patient).   Ezra Shuck 04/21/2024 5:23 PM

## 2024-04-21 NOTE — Progress Notes (Incomplete)
 Advanced Heart Failure Rounding Note  Cardiologist: Vinie JAYSON Maxcy, MD  AHF: Dr. Cherrie   Chief Complaint: Cardiogenic Shock D/T Severe AoV Stenosis   Patient Profile   68 y/o male with h/o bioprosthetic AVR in 2017, PAF, PAD, COPD and tobacco use, admitted w/ CS in the setting of severe prosthetic aortic valve stenosis.   Admit Echo AoV mean gradient 49 mmHg. LVEF 20-25% (down from 40-45%), RV ok.   Subjective:    Remains on DBA 2.5 + NE 8  CVP 3  PAP 46/20 TD CI 2.1  Co-ox 70%  FICK CI 2.3    1.6L in UOP yesterday w/ PO torsemide  SCr 0.76, K 3.8   Feels ok. Breathing improved. Denies CP. Only complaint is poor sleep.   ____________________ [facility] 10/21   RPAV lesion is 95% stenosed.   RHC Procedural Findings (dobutamine 2.5 + NE 10): Hemodynamics (mmHg) RA 4 RV 38/9 PA 49/22, mean 32 PCWP mean 23  Oxygen saturations: PA 63% AO 88%  Cardiac Output (Fick) 4.42  Cardiac Index (Fick) 2.64  PVR 2 WU  Cardiac Output (Thermo) 4.47  Cardiac Index (Thermo) 2.67  PVR 2 WU  PAPi 6.75   Objective:   Weight Range: 73.8 kg Body mass index is 23.34 kg/m.   Vital Signs:   Temp:  [97.9 F (36.6 C)-99.5 F (37.5 C)] 98.4 F (36.9 C) (10/22 0800) Pulse Rate:  [0-94] 88 (10/22 0800) Resp:  [15-34] 28 (10/22 0800) BP: (86-130)/(44-110) 95/66 (10/22 0745) SpO2:  [76 %-97 %] 96 % (10/22 0800) Last BM Date :  (PTA)  Weight change: Filed Weights   04/19/24 1120 04/19/24 2026  Weight: 76.7 kg 73.8 kg    Intake/Output:   Intake/Output Summary (Last 24 hours) at 04/21/2024 0819 Last data filed at 04/21/2024 0700 Gross per 24 hour  Intake 932.69 ml  Output 2625 ml  Net -1692.31 ml      Physical Exam    GENERAL: NAD Lungs- diminished at bases  CARDIAC:  JVP not elevated. LIJ Swan          Normal rate with regular rhythm. 2/6 SEM loudest RUSB  ABDOMEN: Soft, non-tender, non-distended.  EXTREMITIES: Warm and dry. No LEE  NEUROLOGIC: No  obvious FND  Telemetry   NSR 70s, personally reviewed   EKG   N/A   Labs    CBC Recent Labs    04/20/24 0449 04/20/24 1406 04/21/24 0502  WBC 8.5  --  7.1  HGB 14.8 15.3  16.0 14.4  HCT 44.7 45.0  47.0 42.5  MCV 101.8*  --  99.5  PLT 140*  --  123*   Basic Metabolic Panel Recent Labs    89/78/74 0449 04/20/24 1406 04/21/24 0502  NA 135 140  137 133*  K 3.8 3.9  4.2 3.8  CL 102  --  103  CO2 23  --  22  GLUCOSE 150*  --  178*  BUN 22  --  21  CREATININE 0.78  --  0.68  CALCIUM  8.5*  --  8.3*  MG  --   --  1.9   Liver Function Tests Recent Labs    04/20/24 0449 04/21/24 0502  AST 21 18  ALT 18 17  ALKPHOS 40 37*  BILITOT 1.0 1.1  PROT 6.1* 5.9*  ALBUMIN  3.2* 3.1*   No results for input(s): LIPASE, AMYLASE in the last 72 hours. Cardiac Enzymes No results for input(s): CKTOTAL, CKMB, CKMBINDEX, TROPONINI in the last  72 hours.  BNP: BNP (last 3 results) Recent Labs    03/25/24 1359 04/19/24 1133  BNP 1,332.6* 1,400.2*    ProBNP (last 3 results) No results for input(s): PROBNP in the last 8760 hours.   D-Dimer No results for input(s): DDIMER in the last 72 hours. Hemoglobin A1C No results for input(s): HGBA1C in the last 72 hours. Fasting Lipid Panel Recent Labs    04/20/24 0449  CHOL 108  HDL 52  LDLCALC 42  TRIG 68  CHOLHDL 2.1   Thyroid Function Tests No results for input(s): TSH, T4TOTAL, T3FREE, THYROIDAB in the last 72 hours.  Invalid input(s): FREET3  Other results:   Imaging    CARDIAC CATHETERIZATION Result Date: 04/20/2024   RPAV lesion is 95% stenosed. 1. 95% stenosis in a large PLV vessel (the RCA has a relatively early bifurcation). 2. Low RA pressure but elevated PCWP. 3. Mild pulmonary venous hypertension. 4. Cardiac output adequate on dobutamine + norepinephrine.     Medications:     Scheduled Medications:  aspirin  EC  81 mg Oral Daily   atorvastatin   10 mg Oral Daily    Chlorhexidine  Gluconate Cloth  6 each Topical Daily   sodium chloride  flush  10-40 mL Intracatheter Q12H   sodium chloride  flush  3 mL Intravenous Q12H   sodium chloride  flush  3 mL Intravenous Q12H   tamsulosin   0.4 mg Oral QPC supper   traZODone  50 mg Oral QHS    Infusions:  sodium chloride      DOBUTamine 2.5 mcg/kg/min (04/20/24 1200)   heparin  1,000 Units/hr (04/21/24 0700)   norepinephrine (LEVOPHED) Adult infusion 10 mcg/min (04/21/24 0700)    PRN Medications: sodium chloride , acetaminophen , ondansetron  (ZOFRAN ) IV, mouth rinse, sodium chloride  flush, sodium chloride  flush, sodium chloride  flush     Assessment/Plan   1. Acute on chronic systolic CHF/cardiogenic shock: Echo this admission with EF 20-25%, moderate LV dilation,  RV normal, moderate-severe MR, bioprosthetic aortic valve with severe stenosis and mean gradient 49 with mild-moderate AI, IVC not dilated.  Prior echo had shown EF 45-50%.  In the past, EF dropped with severe AS.  Suspect this is the cause again.  LHC in 2017 showed nonobstructive CAD, LHC this admission showed 95% stenosis large RPLV branch.  On RHC, RA pressure was low but PCWP elevated. This morning remains on dobutamine 2.5 and NE 8. Co-ox 70%, FICK CI 2.3.  CVP 3   - Continue current dobutamine 2.5, wean NE as BP allows.  - Continue Torsemide 20 mg daily.  - no GDMT currently given low BP requiring NE  - Discussing valve replacement options w/ structural team   2. Aortic stenosis: Severe bioprosthetic AS with mild-moderate AI. He initially had AVR for bicuspid aortic valve. Structural heart team has seen for evaluation for valve-in-valve TAVR. Unfortunately, pre-TAVR peripheral vascular scans show severe peripheral artery disease that will likely preclude TF access  - will discuss alternative plans w/ structural team today   3. Spiculated Left Apical Pulmonary Nodule/ Prostatomegaly - incidental findings on TAVR protocol CTA - plan evaluation by  PET-CT as outpatient  - F/u PSA reassuring, normal at 1.9  mg/ml, can follow w/ outpatient urology   4. CAD: No chest pain, minimally elevated troponin with no trend.  Suspect demand ischemia from shock/CHF.  LHC shows 95% stenosis in large RPLV branch.  For the time being, will manage medically.  - ASA 81.  - Continue statin.   5. Atrial fibrillation: Paroxysmal.  He  is in NSR here.  - Continue heparin  gtt.   6 PAD: History of peripheral intervention in 2018.  Will need pre-TAVR peripheral vascular evaluation.   7. Smoking/suspected COPD: Needs to quit.    CRITICAL CARE Performed by: Caffie Shed   Total critical care time: 15 minutes  Critical care time was exclusive of separately billable procedures and treating other patients.  Critical care was necessary to treat or prevent imminent or life-threatening deterioration.  Critical care was time spent personally by me on the following activities: development of treatment plan with patient and/or surrogate as well as nursing, discussions with consultants, evaluation of patient's response to treatment, examination of patient, obtaining history from patient or surrogate, ordering and performing treatments and interventions, ordering and review of laboratory studies, ordering and review of radiographic studies, pulse oximetry and re-evaluation of patient's condition.    Length of Stay: 2  Caffie Shed, PA-C  04/21/2024, 8:19 AM  Advanced Heart Failure Team Pager [phone] (M-F; 7a - 5p)  Please contact [facility] for night-coverage after hours (5p -7a ) and weekends on [email or web address]  Agree with above.   He remains on NE 10 and DBA 2.5. On heparin . No bleeding.   CT scans reviewed findings concerning for LUL lung CA and possible prostate CA PSA ok   Denies CP or SOB   PAP: (28-78)/(10-37) 50/30 CVP:  [0 mmHg-20 mmHg] 8 mmHg CO:  [4 L/min-4.3 L/min] 4 L/min CI:  [2.1 L/min/m2-2.25 L/min/m2] 2.1  L/min/m2   General:  Sitting up in bed  No resp difficulty HEENT: normal Neck: supple. no JVD. Carotids 2+ bilat; no bruits. No lymphadenopathy or thryomegaly appreciated. Cor: Regular rate & rhythm 2/6 AS no s2 Lungs: decreased throughout  Abdomen: soft, nontender, nondistended. No hepatosplenomegaly. No bruits or masses. Good bowel sounds. Extremities: no cyanosis, clubbing, rash, edema Neuro: alert & orientedx3, cranial nerves grossly intact. moves all 4 extremities w/o difficulty. Affect pleasant  Remains inotrope/pressor dependent in setting or critical prosthetic AS. CVP low. Stop diuretics. Likely pull Education administrator.   I reviewed case with Oncology personally and prognosis felt to be > 15yr with lung CA as long as no brain mets. (Will scan)   Thus will proceed with TAVR  CTs reviewed with industry and appears that subclavian may be best approach. TCTS team to see tomorrow. Structural team following and coordinating.   CRITICAL CARE Performed by: Cherrie Sieving  Total critical care time: 40 minutes  Critical care time was exclusive of separately billable procedures and treating other patients.  Critical care was necessary to treat or prevent imminent or life-threatening deterioration.  Critical care was time spent personally by me (independent of midlevel providers or residents) on the following activities: development of treatment plan with patient and/or surrogate as well as nursing, discussions with consultants, evaluation of patient's response to treatment, examination of patient, obtaining history from patient or surrogate, ordering and performing treatments and interventions, ordering and review of laboratory studies, ordering and review of radiographic studies, pulse oximetry and re-evaluation of patient's condition.  Sieving Cherrie, MD  7:21 PM

## 2024-04-21 NOTE — Progress Notes (Signed)
 Patient ID: [name], male   DOB: 02-21-1956, 68 y.o.   MRN: [id number]     Advanced Heart Failure Rounding Note  Cardiologist: Vinie JAYSON Maxcy, MD  AHF: Dr. Cherrie   Chief Complaint: Cardiogenic Shock D/T Severe AoV Stenosis   Patient Profile   68 y/o male with h/o bioprosthetic AVR in 2017, PAF, PAD, COPD and tobacco use, admitted w/ CS in the setting of severe prosthetic aortic valve stenosis.   Admit Echo AoV mean gradient 49 mmHg. LVEF 20-25% (down from 40-45%), RV ok.   Subjective:    Feels ok today, no dyspnea.  Remains on 2L [place].   I/Os net negative 1774 with Lasix  80 mg IV x 1 yesterday.  He remains on dobutamine 2.5 + NE 9.   Swan: RA < 5 PA 31/21 CI 2.47 Co-ox 73.5%  RHC/LHC 10/21: Diagnostic Dominance: Right Left Main  Vessel was injected. Vessel is normal in caliber. Vessel is angiographically normal.    Left Anterior Descending  Vessel was injected. Vessel is normal in caliber. The vessel exhibits minimal luminal irregularities.    Left Circumflex  Vessel was injected. Vessel is normal in caliber. There is mild diffuse disease throughout the vessel.    Right Coronary Artery    Right Posterior Atrioventricular Artery  RPAV lesion is 95% stenosed.    Third Right Posterolateral Branch  Collaterals  3rd RPL filled by collaterals from Dist LAD.      Intervention   No interventions have been documented.   Right Heart  Right Heart Pressures RHC Procedural Findings (dobutamine 2.5 + NE 10): Hemodynamics (mmHg) RA 4 RV 38/9 PA 49/22, mean 32 PCWP mean 23  Oxygen saturations: PA 63% AO 88%  Cardiac Output (Fick) 4.42  Cardiac Index (Fick) 2.64  PVR 2 WU  Cardiac Output (Thermo) 4.47  Cardiac Index (Thermo) 2.67  PVR 2 WU  PAPi 6.75     Objective:   Weight Range: 73.8 kg Body mass index is 23.34 kg/m.   Vital Signs:   Temp:  [97.9 F (36.6 C)-99.5 F (37.5 C)] 98.4 F (36.9 C) (10/22 0800) Pulse Rate:  [0-94] 88  (10/22 0800) Resp:  [16-34] 28 (10/22 0800) BP: (86-130)/(44-110) 95/66 (10/22 0745) SpO2:  [76 %-97 %] 96 % (10/22 0800) Last BM Date :  (PTA)  Weight change: Filed Weights   04/19/24 1120 04/19/24 2026  Weight: 76.7 kg 73.8 kg    Intake/Output:   Intake/Output Summary (Last 24 hours) at 04/21/2024 0832 Last data filed at 04/21/2024 0700 Gross per 24 hour  Intake 932.69 ml  Output 2625 ml  Net -1692.31 ml      Physical Exam    CVP < 5 General: NAD Neck: No JVD, no thyromegaly or thyroid nodule.  Lungs: Clear to auscultation bilaterally with normal respiratory effort. No carotid bruit.  Normal pedal pulses.  Abdomen: Soft, nontender, no hepatosplenomegaly, no distention.  Skin: Intact without lesions or rashes.  Neurologic: Alert and oriented x 3.  Psych: Normal affect. Extremities: No clubbing or cyanosis.  HEENT: Normal.   Telemetry   NSR 80s, personally reviewed    Labs    CBC Recent Labs    04/20/24 0449 04/20/24 1406 04/21/24 0502  WBC 8.5  --  7.1  HGB 14.8 15.3  16.0 14.4  HCT 44.7 45.0  47.0 42.5  MCV 101.8*  --  99.5  PLT 140*  --  123*   Basic Metabolic Panel Recent Labs    89/78/74 0449  04/20/24 1406 04/21/24 0502  NA 135 140  137 133*  K 3.8 3.9  4.2 3.8  CL 102  --  103  CO2 23  --  22  GLUCOSE 150*  --  178*  BUN 22  --  21  CREATININE 0.78  --  0.68  CALCIUM  8.5*  --  8.3*  MG  --   --  1.9   Liver Function Tests Recent Labs    04/20/24 0449 04/21/24 0502  AST 21 18  ALT 18 17  ALKPHOS 40 37*  BILITOT 1.0 1.1  PROT 6.1* 5.9*  ALBUMIN  3.2* 3.1*   No results for input(s): LIPASE, AMYLASE in the last 72 hours. Cardiac Enzymes No results for input(s): CKTOTAL, CKMB, CKMBINDEX, TROPONINI in the last 72 hours.  BNP: BNP (last 3 results) Recent Labs    03/25/24 1359 04/19/24 1133  BNP 1,332.6* 1,400.2*    ProBNP (last 3 results) No results for input(s): PROBNP in the last 8760  hours.   D-Dimer No results for input(s): DDIMER in the last 72 hours. Hemoglobin A1C No results for input(s): HGBA1C in the last 72 hours. Fasting Lipid Panel Recent Labs    04/20/24 0449  CHOL 108  HDL 52  LDLCALC 42  TRIG 68  CHOLHDL 2.1   Thyroid Function Tests No results for input(s): TSH, T4TOTAL, T3FREE, THYROIDAB in the last 72 hours.  Invalid input(s): FREET3  Other results:   Imaging    CARDIAC CATHETERIZATION Result Date: 04/20/2024   RPAV lesion is 95% stenosed. 1. 95% stenosis in a large PLV vessel (the RCA has a relatively early bifurcation). 2. Low RA pressure but elevated PCWP. 3. Mild pulmonary venous hypertension. 4. Cardiac output adequate on dobutamine + norepinephrine.     Medications:     Scheduled Medications:  aspirin  EC  81 mg Oral Daily   atorvastatin   10 mg Oral Daily   Chlorhexidine  Gluconate Cloth  6 each Topical Daily   sodium chloride  flush  10-40 mL Intracatheter Q12H   sodium chloride  flush  3 mL Intravenous Q12H   sodium chloride  flush  3 mL Intravenous Q12H   tamsulosin   0.4 mg Oral QPC supper   traZODone  50 mg Oral QHS    Infusions:  sodium chloride      DOBUTamine 2.5 mcg/kg/min (04/20/24 1200)   heparin  1,000 Units/hr (04/21/24 0700)   norepinephrine (LEVOPHED) Adult infusion 10 mcg/min (04/21/24 0700)    PRN Medications: sodium chloride , acetaminophen , ondansetron  (ZOFRAN ) IV, mouth rinse, sodium chloride  flush, sodium chloride  flush, sodium chloride  flush     Assessment/Plan   1. Acute on chronic systolic CHF/cardiogenic shock: Echo this admission with EF 20-25%, moderate LV dilation,  RV normal, moderate-severe MR, bioprosthetic aortic valve with severe stenosis and mean gradient 49 with mild-moderate AI, IVC not dilated.  Prior echo had shown EF 45-50%.  In the past, EF dropped with severe AS.  Suspect this is the cause again.  LHC in 2017 showed nonobstructive CAD, LHC this admission showed 95%  stenosis large RPLV branch.  On RHC, RA pressure was low but PCWP elevated. This morning, RA < 5 from [place] but PADP 21.  Good cardiac output by thermo and co-ox.  He is on dobutamine 2.5 and NE 9.    - Continue current dobutamine 2.5, wean NE as BP allows.  - Torsemide 20 mg daily.  - He will need valve-in-valve TAVR, structural team following.  2. Aortic stenosis: Severe bioprosthetic AS with mild-moderate AI.  I reviewed echo.  He initially had AVR for bicuspid aortic valve.  Now undergoing evaluation for valve-in-valve TAVR. He has significant PAD which may be a problem for TAVR.  - To get CTs today.  - Will need to decide on timing, Friday vs Tuesday.  3. CAD: No chest pain, minimally elevated troponin with no trend.  Suspect demand ischemia from shock/CHF.  LHC shows 95% stenosis in large RPLV branch.  For the time being, will manage medically.  - ASA 81.  - Continue statin.  4. Atrial fibrillation: Paroxysmal.  He is in NSR here.  - Continue heparin  gtt.  5. PAD: History of peripheral intervention in 2018.  Will need pre-TAVR peripheral vascular evaluation.  6. Smoking/suspected COPD: Needs to quit.    CRITICAL CARE Performed by: Ezra Shuck  Total critical care time: 40 minutes  Critical care time was exclusive of separately billable procedures and treating other patients.  Critical care was necessary to treat or prevent imminent or life-threatening deterioration.  Critical care was time spent personally by me on the following activities: development of treatment plan with patient and/or surrogate as well as nursing, discussions with consultants, evaluation of patient's response to treatment, examination of patient, obtaining history from patient or surrogate, ordering and performing treatments and interventions, ordering and review of laboratory studies, ordering and review of radiographic studies, pulse oximetry and re-evaluation of patient's condition.  Ezra Shuck 04/21/2024 8:32 AM

## 2024-04-21 NOTE — Progress Notes (Signed)
 PHARMACY - ANTICOAGULATION CONSULT NOTE  Pharmacy Consult for heparin  infusion Indication: atrial fibrillation  Allergies  Allergen Reactions   No Known Allergies    Patient Measurements: Height: 5' 10 (177.8 cm) Weight: 73.8 kg (162 lb 11.2 oz) IBW/kg (Calculated) : 73 HEPARIN  DW (KG): 76.7  Vital Signs: Temp: 99.5 F (37.5 C) (10/21 2000) BP: 107/67 (10/22 0330) Pulse Rate: 81 (10/22 0330)  Labs: Recent Labs    04/19/24 1133 04/19/24 1354 04/19/24 1811 04/20/24 0449 04/20/24 1406 04/21/24 0502  HGB 14.9  --   --  14.8 15.3  16.0 14.4  HCT 44.1  --   --  44.7 45.0  47.0 42.5  PLT 133*  --   --  140*  --  123*  CREATININE 0.77  --   --  0.78  --  0.68  TROPONINIHS 218* 259* 268*  --   --   --    Estimated Creatinine Clearance: 88.7 mL/min (by C-G formula based on SCr of 0.68 mg/dL).  Medical History: Past Medical History:  Diagnosis Date   Asthma    CAD (coronary artery disease) 01/23/2016   minor CAD at cath-40% RCA   CHF (congestive heart failure) (HCC)    aortic valve replacement   COPD (chronic obstructive pulmonary disease) (HCC)    denies patient said that a pulmonologist said he said that he didnt have COPD   Dyspnea    Dysrhythmia    hx afib   Heart murmur    had a murmur before the aortic valve replacement, no murmur according to dr. Mona per patient   Hypertension    Non-ischemic cardiomyopathy (HCC) 01/23/2016   EF 30-35%   Pneumonia    Severe aortic stenosis    SOBOE (shortness of breath on exertion)     Assessment: Patient is a 68 YO male presenting to the ED with shortness of breath, increased inhaler use needs, and fatigue. Troponins 218 > 259. Cardiology consulted for evaluation of CHF exacerbation and elevated troponins. PMH significant for atrial fibrillation on Xarelto  PTA, and bioprosthetic AVR. Pharmacy consulted to dose heparin  for atrial fibrillation.   Last dose of Xarelto  on 10/19 at 8pm.  aPTT 42s is subtherapeutic with  heparin  running at 1000 units/hr. Heparin  level 0.80 is not yet correlating. Hgb (14.4) is stable, PLTs (123) are slightly down. Per RN, no report of pauses, issues with the line, or signs of bleeding.    Goal of Therapy:  Heparin  level 0.3-0.7 units/ml aPTT 66-102s Monitor platelets by anticoagulation protocol: Yes   Plan:  Increase heparin  infusion to 1200 units/hr Check aPTT, heparin  level in 8h. Monitor aPTT, heparin  level, CBC daily until correlation.  F/u transition to DOAC when no further procedures required   Thank you for allowing pharmacy to be a part of this patient's care.   Nidia Schaffer, PharmD PGY2 Cardiology Pharmacy Resident  Please check AMION for all [facility] Pharmacy phone numbers After 10:00 PM, call Main Pharmacy [phone] 04/21/2024 6:44 AM

## 2024-04-21 NOTE — Progress Notes (Signed)
  [facility]   MULTIDISCIPLINARY HEART VALVE TEAM   Called by radiology about positioning of [name] cath which needs to be pulled back (communicated to [employer] and Dr. Rolan).   Also peripheral TAVR studies showed:  Severe peripheral artery disease that will likely preclude TF access.    Spiculated left apical pulmonary nodule measuring up to 2.6 cm. Differential considerations include primary bronchogenic carcinoma versus masslike pleuroparenchymal scarring. Recommend further evaluation with nonemergent PET-CT. If hypermetabolic, then recommend referral to the multi disciplinary thoracic oncology conference.  Moderate bilateral layering pleural effusions larger on the right than the left. Focal dependent atelectasis versus infiltrate within the posterior right upper lobe. Advanced centrilobular pulmonary emphysema and diffuse chronic bronchial wall thickening. Findings suggest underlying COPD.  Prostatomegaly with indentation of the bladder base. Malignancy is difficult to exclude entirely. Recommend non emergent urology referral for cystoscopy and further evaluation in the future.  I spoke to Dr. Federico over the phone who recommended ordering a PSA and referring to pulm and urology in the outpatient setting.   Still awaiting the results of the cardiac CT.   Will discuss with team and make a plan tomorrow AM.   [name] PA-C  [facility]

## 2024-04-21 NOTE — Progress Notes (Signed)
 PHARMACY - ANTICOAGULATION CONSULT NOTE  Pharmacy Consult for heparin  infusion Indication: atrial fibrillation  Allergies  Allergen Reactions   No Known Allergies    Patient Measurements: Height: 5' 10 (177.8 cm) Weight: 73.8 kg (162 lb 11.2 oz) IBW/kg (Calculated) : 73 HEPARIN  DW (KG): 76.7  Vital Signs: Temp: 98.8 F (37.1 C) (10/22 1745) BP: 127/113 (10/22 1730) Pulse Rate: 90 (10/22 1745)  Labs: Recent Labs    04/19/24 1133 04/19/24 1354 04/19/24 1811 04/20/24 0449 04/20/24 1406 04/21/24 0502 04/21/24 0646 04/21/24 1700  HGB 14.9  --   --  14.8 15.3  16.0 14.4  --   --   HCT 44.1  --   --  44.7 45.0  47.0 42.5  --   --   PLT 133*  --   --  140*  --  123*  --   --   APTT  --   --   --   --   --   --  42* 40*  HEPARINUNFRC  --   --   --   --   --   --  0.80*  --   CREATININE 0.77  --   --  0.78  --  0.68  --   --   TROPONINIHS 218* 259* 268*  --   --   --   --   --    Estimated Creatinine Clearance: 88.7 mL/min (by C-G formula based on SCr of 0.68 mg/dL).  Medical History: Past Medical History:  Diagnosis Date   Asthma    CAD (coronary artery disease) 01/23/2016   minor CAD at cath-40% RCA   CHF (congestive heart failure) (HCC)    aortic valve replacement   COPD (chronic obstructive pulmonary disease) (HCC)    denies patient said that a pulmonologist said he said that he didnt have COPD   Dyspnea    Dysrhythmia    hx afib   Heart murmur    had a murmur before the aortic valve replacement, no murmur according to dr. Mona per patient   Hypertension    Non-ischemic cardiomyopathy (HCC) 01/23/2016   EF 30-35%   Pneumonia    Severe aortic stenosis    SOBOE (shortness of breath on exertion)     Assessment: Patient is a 68 YO male presenting to the ED with shortness of breath, increased inhaler use needs, and fatigue. Troponins 218 > 259. Cardiology consulted for evaluation of CHF exacerbation and elevated troponins. PMH significant for atrial  fibrillation on Xarelto  PTA, and bioprosthetic AVR. Pharmacy consulted to dose heparin  for atrial fibrillation.   Last dose of Xarelto  on 10/19 at 8pm.  aPTT 40s is subtherapeutic with heparin  running at 1200 units/hr. Hgb (14.4) is stable, PLTs (123) are slightly down. Per RN, no report of pauses, issues with the line, or signs of bleeding.    Goal of Therapy:  Heparin  level 0.3-0.7 units/ml aPTT 66-102s Monitor platelets by anticoagulation protocol: Yes   Plan:  Increase heparin  infusion to 1350 units/hr Check aPTT, heparin  level in 8h. Monitor aPTT, heparin  level, CBC daily until correlation.  F/u transition to DOAC when no further procedures required   Thank you for allowing pharmacy to be a part of this patient's care.   Harlene Barlow, [employer], BCCP Clinical Pharmacist  04/21/2024 6:58 PM   [facility] pharmacy phone numbers are listed on [email or web address]

## 2024-04-22 ENCOUNTER — Inpatient Hospital Stay (HOSPITAL_COMMUNITY)

## 2024-04-22 DIAGNOSIS — I5020 Unspecified systolic (congestive) heart failure: Secondary | ICD-10-CM | POA: Diagnosis not present

## 2024-04-22 DIAGNOSIS — R570 Cardiogenic shock: Secondary | ICD-10-CM | POA: Diagnosis not present

## 2024-04-22 LAB — COMPREHENSIVE METABOLIC PANEL WITH GFR
ALT: 17 U/L (ref 0–44)
AST: 20 U/L (ref 15–41)
Albumin: 3.1 g/dL — ABNORMAL LOW (ref 3.5–5.0)
Alkaline Phosphatase: 34 U/L — ABNORMAL LOW (ref 38–126)
Anion gap: 9 (ref 5–15)
BUN: 20 mg/dL (ref 8–23)
CO2: 25 mmol/L (ref 22–32)
Calcium: 8.3 mg/dL — ABNORMAL LOW (ref 8.9–10.3)
Chloride: 100 mmol/L (ref 98–111)
Creatinine, Ser: 0.76 mg/dL (ref 0.61–1.24)
GFR, Estimated: >60 mL/min (ref >60)
Glucose, Bld: 155 mg/dL — ABNORMAL HIGH (ref 70–99)
Potassium: 3.8 mmol/L (ref 3.5–5.1)
Sodium: 134 mmol/L — ABNORMAL LOW (ref 135–145)
Total Bilirubin: 0.9 mg/dL (ref 0.0–1.2)
Total Protein: 6 g/dL — ABNORMAL LOW (ref 6.5–8.1)

## 2024-04-22 LAB — GLUCOSE, CAPILLARY
Glucose-Capillary: 149 mg/dL — ABNORMAL HIGH (ref 70–99)
Glucose-Capillary: 133 mg/dL — ABNORMAL HIGH (ref 70–99)
Glucose-Capillary: 139 mg/dL — ABNORMAL HIGH (ref 70–99)
Glucose-Capillary: 127 mg/dL — ABNORMAL HIGH (ref 70–99)

## 2024-04-22 LAB — CBC
HCT: 40.9 % (ref 39.0–52.0)
Hemoglobin: 13.9 g/dL (ref 13.0–17.0)
MCH: 33.7 pg (ref 26.0–34.0)
MCHC: 34 g/dL (ref 30.0–36.0)
MCV: 99.3 fL (ref 80.0–100.0)
Platelets: 106 K/uL — ABNORMAL LOW (ref 150–400)
RBC: 4.12 MIL/uL — ABNORMAL LOW (ref 4.22–5.81)
RDW: 14 % (ref 11.5–15.5)
WBC: 6.2 K/uL (ref 4.0–10.5)
nRBC: 0 % (ref 0.0–0.2)

## 2024-04-22 LAB — HEPARIN LEVEL (UNFRACTIONATED)
Heparin Unfractionated: 0.29 [IU]/mL — ABNORMAL LOW (ref 0.30–0.70)
Heparin Unfractionated: 0.31 [IU]/mL (ref 0.30–0.70)

## 2024-04-22 LAB — APTT
aPTT: 61 s — ABNORMAL HIGH (ref 24–36)
aPTT: 61 s — ABNORMAL HIGH (ref 24–36)
aPTT: 69 s — ABNORMAL HIGH (ref 24–36)

## 2024-04-22 LAB — HEMOGLOBIN A1C
Hgb A1c MFr Bld: 5.2 % (ref 4.8–5.6)
Mean Plasma Glucose: 102.54 mg/dL

## 2024-04-22 LAB — COOXEMETRY PANEL
Carboxyhemoglobin: 2 % — ABNORMAL HIGH (ref 0.5–1.5)
Methemoglobin: 1.2 % (ref 0.0–1.5)
O2 Saturation: 69.5 %
Total hemoglobin: 15 g/dL (ref 12.0–16.0)

## 2024-04-22 LAB — LIPOPROTEIN A (LPA): Lipoprotein (a): 21.4 nmol/L (ref <75.0)

## 2024-04-22 MED ORDER — POLYETHYLENE GLYCOL 3350 17 G PO PACK
17.0000 g | PACK | Freq: Two times a day (BID) | ORAL | Status: DC
  Administered 2024-04-22 – 2024-05-04 (×15): 17 g via ORAL
  Filled 2024-04-22 (×22): qty 1

## 2024-04-22 MED ORDER — POTASSIUM CHLORIDE CRYS ER 20 MEQ PO TBCR
40.0000 meq | EXTENDED_RELEASE_TABLET | Freq: Once | ORAL | Status: AC
Start: 2024-04-22 — End: 2024-04-22
  Administered 2024-04-22: 40 meq via ORAL
  Filled 2024-04-22: qty 2

## 2024-04-22 MED ORDER — FINASTERIDE 5 MG PO TABS
5.0000 mg | ORAL_TABLET | Freq: Every evening | ORAL | Status: DC
Start: 2024-04-22 — End: 2024-05-04
  Administered 2024-04-22 – 2024-05-03 (×12): 5 mg via ORAL
  Filled 2024-04-22 (×12): qty 1

## 2024-04-22 MED ORDER — SENNA 8.6 MG PO TABS
1.0000 | ORAL_TABLET | Freq: Two times a day (BID) | ORAL | Status: DC
  Administered 2024-04-22 – 2024-05-04 (×19): 8.6 mg via ORAL
  Filled 2024-04-22 (×22): qty 1

## 2024-04-22 MED ORDER — GADOBUTROL 1 MMOL/ML IV SOLN
7.4000 mL | Freq: Once | INTRAVENOUS | Status: AC | PRN
  Administered 2024-04-22: 7.4 mL via INTRAVENOUS

## 2024-04-22 NOTE — Progress Notes (Signed)
 PHARMACY - ANTICOAGULATION CONSULT NOTE  Pharmacy Consult for heparin  infusion Indication: atrial fibrillation  Allergies  Allergen Reactions   No Known Allergies    Patient Measurements: Height: 5' 10 (177.8 cm) Weight: 73.8 kg (162 lb 11.2 oz) IBW/kg (Calculated) : 73 HEPARIN  DW (KG): 76.7  Vital Signs: Temp: 98.4 F (36.9 C) (10/23 0930) Temp Source: Core (10/23 0715) BP: 99/73 (10/23 0930) Pulse Rate: 83 (10/23 0930)  Labs: Recent Labs    04/19/24 1354 04/19/24 1811 04/20/24 0449 04/20/24 0449 04/20/24 1406 04/21/24 0502 04/21/24 0646 04/21/24 0646 04/21/24 1700 04/22/24 0452 04/22/24 1116  HGB  --   --  14.8   < > 15.3  16.0 14.4  --   --   --  13.9  --   HCT  --   --  44.7   < > 45.0  47.0 42.5  --   --   --  40.9  --   PLT  --   --  140*  --   --  123*  --   --   --  106*  --   APTT  --   --   --   --   --   --  42*   < > 40* 61* 61*  HEPARINUNFRC  --   --   --   --   --   --  0.80*  --   --  0.29* 0.31  CREATININE  --   --  0.78  --   --  0.68  --   --   --  0.76  --   TROPONINIHS 259* 268*  --   --   --   --   --   --   --   --   --    < > = values in this interval not displayed.   Estimated Creatinine Clearance: 88.7 mL/min (by C-G formula based on SCr of 0.76 mg/dL).  Medical History: Past Medical History:  Diagnosis Date   Asthma    CAD (coronary artery disease) 01/23/2016   minor CAD at cath-40% RCA   CHF (congestive heart failure) (HCC)    aortic valve replacement   COPD (chronic obstructive pulmonary disease) (HCC)    denies patient said that a pulmonologist said he said that he didnt have COPD   Dyspnea    Dysrhythmia    hx afib   Heart murmur    had a murmur before the aortic valve replacement, no murmur according to dr. Mona per patient   Hypertension    Non-ischemic cardiomyopathy (HCC) 01/23/2016   EF 30-35%   Pneumonia    Severe aortic stenosis    SOBOE (shortness of breath on exertion)     Assessment: Patient is a 68 YO  male presenting to the ED with shortness of breath, increased inhaler use needs, and fatigue. Troponins 218 > 259. Cardiology consulted for evaluation of CHF exacerbation and elevated troponins. PMH significant for atrial fibrillation on Xarelto  PTA, and bioprosthetic AVR. Pharmacy consulted to dose heparin  for atrial fibrillation.   Last dose of Xarelto  on 10/19 at 8pm.  aPTT 61s is subtherapeutic with heparin  running at 1500 units/hr. Heparin  level 0.31 is not quite correlating. Hgb (13.9) is stable, PLTs (106) are slightly down. Per RN, no report of pauses, issues with the line, or signs of bleeding.    Goal of Therapy:  Heparin  level 0.3-0.7 units/ml aPTT 66-102s Monitor platelets by anticoagulation protocol: Yes   Plan:  Increase heparin  infusion to 1600 units/hr Check aPTT Monitor aPTT, heparin  level, CBC daily until correlation.  F/u transition to DOAC when no further procedures required   Thank you for allowing pharmacy to be a part of this patient's care.   Nidia Schaffer, PharmD PGY2 Cardiology Pharmacy Resident  Please check AMION for all [facility] Pharmacy phone numbers After 10:00 PM, call Main Pharmacy [phone]  04/22/2024 1:51 PM

## 2024-04-22 NOTE — Consult Note (Cosign Needed Addendum)
 HEART AND VASCULAR CENTER   MULTIDISCIPLINARY HEART VALVE TEAM  Cardiology Consultation:   Patient ID: RODRIC PUNCH MRN: [id number]; DOB: [birth year]  Admit date: 04/19/2024 Date of Consult: 04/23/2024  Primary Care Provider: Sabas Norleen PARAS., MD [facility] Cardiologist: Vinie JAYSON Maxcy, MD  [facility] Electrophysiologist:  None    Patient Profile:   JAHRON HUNSINGER is a 68 y.o. 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 Cherrie.  History of Present Illness:   He has history of nonischemic cardiomyopathy with EF as low as 20-25% in 2017.  Cardiac catheterization at that time demonstrated nonobstructive disease. He also had a bicuspid aortic valve with severe stenosis and insufficiency and underwent AVR with a 25 mm [facility] Ease pericardial tissue valve by Dr. Fleeta Brooke in 12/2015. Post op course c/b bilateral pleural effusions requiring thoracentesis and PAF. During recovery noted to have atrial fibrillation but eventually cardioverted without any documented recurrences.  He remains on Xarleto. Cath at the time of AVR showed non obst CAD (40% mRCA stenosis, 10% pRCA stenosis). He developed LE claudication and he underwent left common femoral, superficial femoral, profunda femoral and external iliac endarterectomy with patch angioplasty in 2018.   Echo from 10/22/23 showed EF 45-50% and severe stenosis of the bioprosthetic aortic valve with dilatation of the left ventricle and mean gradient 40 mmHg.  Aortic regurgitation was noted to be trivial.  Plans were made to refer to structural heart, but then he was  seen in the office on 11/05/2023 for follow up and reported being asymptomatic, so it was decided to continue surveillance in 6 months.    Since that time he has developed worsening dyspnea on exertion, fatigue, and persistent cough. He presented to [facility] on 04/19/24 with resting dyspnea and hypotension BP 70/55. Work up in ER: BNP 1400+. Chest x-ray with new bilateral small pleural effusions with mild interstitial edema and new bibasilar opacities. Respiratory panel negative. Potassium 4.4. Creatinine 0.77. Hemoglobin 14.9. Hs trop 218>>259>>268 c/w demand ischemia.  Lactate normal. Echo 04/19/24 showed EF 20-25%, WMAs, mod-severe LV dilation, mild LVH, severe LAE, mod-severe MR, and severe bioprosthetic valve failure with severe AS: mean gradient 49 mm hg, Vmax 4.72 m/s, AVA 0.61cm2 and moderate to severe AI. Patient was felt to be in cardiogenic shock 2/2 severe AS and acute CHF and advanced CHF team consulted and started on dobutamine and norepinephrine. He has been diuresed with IV Lasix .    L/RHC 04/20/24 showed 95% stenosis in a large PLV vessel (the RCA has a relatively early bifurcation), low RA pressure but elevated PCWP, mild pulmonary venous hypertension.  Cardiac output noted to be adequate but on NE/DA.   Cardiac gated CTA of the heart 04/21/24 revealed anatomical characteristics consistent with bioprosthetic aortic stenosis suitable for treatment by VIV TAVR without any significant complicating features. CTA of the aorta and iliac vessels demonstrated severe peripheral vascular disease. Additionally, a spiculated left apical pulmonary nodule and prostatomegaly were noted. PSA normal. MRI brain without any evidence of metastasis. Outpatient PET scan recommended. Oncology reviewed and felt like pt would have >1 year to live.   Cardiothoracic surgery consulted for consideration of TAVR via alternative  access or right femoral access with Shockwave lithotripsy.    Past Medical History:  Diagnosis  Date   Asthma    CAD (coronary artery disease) 01/23/2016   minor CAD at cath-40% RCA   CHF (congestive heart failure) (HCC)    aortic valve replacement   COPD (chronic obstructive pulmonary disease) (HCC)    denies patient said that a pulmonologist said he said that he didnt have COPD   Dyspnea    Dysrhythmia    hx afib   Heart murmur    had a murmur before the aortic valve replacement, no murmur according to dr. Mona per patient   Hypertension    Non-ischemic cardiomyopathy (HCC) 01/23/2016   EF 30-35%   Pneumonia    Severe aortic stenosis    SOBOE (shortness of breath on exertion)     Past Surgical History:  Procedure Laterality Date   ABDOMINAL AORTOGRAM W/LOWER EXTREMITY N/A 10/16/2016   Procedure: Abdominal Aortogram w/Lower Extremity;  Surgeon: Deatrice DELENA Cage, MD;  Location: [facility] INVASIVE CV LAB;  Service: Cardiovascular;  Laterality: N/A;   AORTIC VALVE REPLACEMENT N/A 01/29/2016   Procedure: AORTIC VALVE REPLACEMENT (AVR) with Magna Ease size 25mm;  Surgeon: Maude Fleeta Ochoa, MD;  Location: [facility] OR;  Service: Open Heart Surgery;  Laterality: N/A;   CARDIAC CATHETERIZATION N/A 01/23/2016   Procedure: Right/Left Heart Cath and Coronary Angiography;  Surgeon: Vinie JAYSON Mona, MD;  Location: [facility] INVASIVE CV LAB;  Service: Cardiovascular;  Laterality: N/A;   CARDIOVERSION N/A 03/26/2016   Procedure: CARDIOVERSION;  Surgeon: Vinie JAYSON Mona, MD;  Location: [facility] ENDOSCOPY;  Service: Cardiovascular;  Laterality: N/A;   CATARACT EXTRACTION  12/2002 & 09/2007   ENDARTERECTOMY FEMORAL Left 11/01/2016   Procedure: ENDARTERECTOMY FEMORAL;  Surgeon: Serene Gaile ORN, MD;  Location: [facility] OR;  Service: Vascular;  Laterality: Left;   PATCH ANGIOPLASTY Left 11/01/2016   Procedure: PATCH ANGIOPLASTY;  Surgeon: Serene Gaile ORN, MD;  Location: [facility] OR;  Service: Vascular;  Laterality: Left;   RIGHT HEART CATH AND CORONARY ANGIOGRAPHY N/A 04/20/2024   Procedure: RIGHT HEART CATH AND CORONARY  ANGIOGRAPHY;  Surgeon: Rolan Ezra RAMAN, MD;  Location: [facility] INVASIVE CV LAB;  Service: Cardiovascular;  Laterality: N/A;   SHOULDER SURGERY Right 07/01/1969   TEE WITHOUT CARDIOVERSION N/A 01/29/2016   Procedure: TRANSESOPHAGEAL ECHOCARDIOGRAM (TEE);  Surgeon: Maude Fleeta Ochoa, MD;  Location: [facility] OR;  Service: Open Heart Surgery;  Laterality: N/A;     Home Medications:  Prior to Admission medications   Medication Sig Start Date End Date Taking? Authorizing Provider  acetaminophen  (TYLENOL ) 500 MG tablet Take 1,000 mg by mouth daily as needed for headache.   Yes [provider]  albuterol  (VENTOLIN  HFA) 108 (90 Base) MCG/ACT inhaler Inhale 2 puffs into the lungs every 4 (four) hours as needed for wheezing or shortness of breath. 03/22/24  Yes [provider]  atorvastatin  (LIPITOR) 10 MG tablet TAKE 1 TABLET BY MOUTH EVERY DAY 08/17/21  Yes Hilty, Vinie JAYSON, MD  carvedilol  (COREG ) 3.125 MG tablet TAKE 1 TABLET BY MOUTH TWICE A DAY WITH MEALS 09/18/21  Yes Hilty, Vinie JAYSON, MD  empagliflozin  (JARDIANCE ) 10 MG TABS tablet Take 1 tablet (10 mg total) by mouth daily before breakfast. 11/05/23  Yes Hilty, Vinie JAYSON, MD  finasteride  (PROSCAR ) 5 MG tablet Take 1 tablet (5 mg total) by mouth daily. Patient taking differently: Take 5 mg by mouth every evening. 02/20/16  Yes Hilty, Vinie JAYSON, MD  losartan  (COZAAR ) 100 MG tablet  Take 1 tablet by mouth once daily 12/25/21  Yes Hilty, Vinie BROCKS, MD  rivaroxaban  (XARELTO ) 20 MG TABS tablet Take 1 tablet by mouth once daily Patient taking differently: Take 20 mg by mouth daily with supper. 11/18/23  Yes Hilty, Vinie BROCKS, MD  tamsulosin  (FLOMAX ) 0.4 MG CAPS capsule Take 0.4 mg by mouth every evening. 01/03/16  Yes [provider]  levofloxacin (LEVAQUIN) 500 MG tablet Take 500 mg by mouth daily. Patient not taking: Reported on 04/20/2024 03/22/24   [provider]  naproxen sodium (ALEVE) 220 MG tablet Take 440 mg by mouth daily as needed  (pain).    [provider]    Inpatient Medications: Scheduled Meds:  aspirin  EC  81 mg Oral Daily   atorvastatin   10 mg Oral Daily   Chlorhexidine  Gluconate Cloth  6 each Topical Daily   finasteride   5 mg Oral QPM   polyethylene glycol  17 g Oral BID   senna  1 tablet Oral BID   sodium chloride  flush  10-40 mL Intracatheter Q12H   sodium chloride  flush  3 mL Intravenous Q12H   sodium chloride  flush  3 mL Intravenous Q12H   tamsulosin   0.4 mg Oral QPC supper   traZODone  50 mg Oral QHS   Continuous Infusions:  DOBUTamine 2.5 mcg/kg/min (04/23/24 1000)   heparin  1,600 Units/hr (04/23/24 1000)   norepinephrine (LEVOPHED) Adult infusion 7 mcg/min (04/23/24 1000)   PRN Meds: acetaminophen , ondansetron  (ZOFRAN ) IV, mouth rinse, sodium chloride  flush, sodium chloride  flush, sodium chloride  flush  Allergies:    Allergies  Allergen Reactions   No Known Allergies     Social History:   Social History   Socioeconomic History   Marital status: Married    Spouse name: Not on file   Number of children: 4   Years of education: 10   Highest education level: Not on file  Occupational History   Occupation: Holiday representative    Comment: Engineer, mining  Tobacco Use   Smoking status: Every Day    Current packs/day: 0.00    Average packs/day: 1 pack/day for 48.0 years (48.0 ttl pk-yrs)    Types: Cigarettes    Start date: 01/23/1971    Last attempt to quit: 2025    Years since quitting: 0.8   Smokeless tobacco: Former  Advertising account planner   Vaping status: Former   Quit date: 08/28/2015  Substance and Sexual Activity   Alcohol use: Yes    Alcohol/week: 12.0 standard drinks of alcohol    Types: 12 Standard drinks or equivalent per week    Comment: beer   Drug use: No   Sexual activity: Not Currently  Other Topics Concern   Not on file  Social History Narrative   Not on file   Social Drivers of Health   Financial Resource Strain: Not on file  Food Insecurity: No Food  Insecurity (04/19/2024)   Hunger Vital Sign    Worried About Running Out of Food in the Last Year: Never true    Ran Out of Food in the Last Year: Never true  Transportation Needs: No Transportation Needs (04/19/2024)   PRAPARE - Administrator, Civil Service (Medical): No    Lack of Transportation (Non-Medical): No  Physical Activity: Not on file  Stress: Not on file  Social Connections: Socially Isolated (04/19/2024)   Social Connection and Isolation Panel    Frequency of Communication with Friends and Family: More than three times a week  Frequency of Social Gatherings with Friends and Family: Twice a week    Attends Religious Services: Never    Database administrator or Organizations: No    Attends Banker Meetings: Never    Marital Status: Widowed  Intimate Partner Violence: Not At Risk (04/19/2024)   Humiliation, Afraid, Rape, and Kick questionnaire    Fear of Current or Ex-Partner: No    Emotionally Abused: No    Physically Abused: No    Sexually Abused: No    Family History:    Family History  Problem Relation Age of Onset   Breast cancer Mother    Breast cancer Sister      ROS:  Please see the history of present illness.  All other ROS reviewed and negative.     Physical Exam/Data:   Vitals:   04/23/24 0915 04/23/24 0930 04/23/24 0945 04/23/24 1000  BP: 95/73 (!) 89/66 91/64 94/66   Pulse: 79 80 87 81  Resp: (!) 24 (!) 29 (!) 24 (!) 32  Temp: 98.2 F (36.8 C) 98.2 F (36.8 C) 98.2 F (36.8 C) 98.4 F (36.9 C)  TempSrc:      SpO2: 91% 93% 92% 93%  Weight:      Height:        Intake/Output Summary (Last 24 hours) at 04/23/2024 1224 Last data filed at 04/23/2024 1000 Gross per 24 hour  Intake 968.24 ml  Output 2675 ml  Net -1706.76 ml      04/23/2024    6:30 AM 04/19/2024    8:26 PM 04/19/2024   11:20 AM  Last 3 Weights  Weight (lbs) 161 lb 9.6 oz 162 lb 11.2 oz 169 lb  Weight (kg) 73.3 kg 73.8 kg 76.658 kg     Body  mass index is 23.19 kg/m.  General:  appears older than his stated age.  Neck: no JVD Cardiac:  normal S1, S2; RRR; very soft murmur. Lungs:  clear to auscultation bilaterally, no wheezing, rhonchi or rales  Abd: soft, nontender, no hepatomegaly  Ext: no edema Musculoskeletal:  No deformities, BUE and BLE strength normal and equal Skin: warm and dry  Neuro:  CNs 2-12 intact, no focal abnormalities noted Psych:  Normal affect   EKG:  The EKG was personally reviewed and demonstrates:  NSR with PVCs, LVH, HR 78 Telemetry:  Telemetry was personally reviewed and demonstrates:  sinus in 80s  Cardiac Studies & Procedures   ______________________________________________________________________________________________ CARDIAC CATHETERIZATION  CARDIAC CATHETERIZATION 04/20/2024  Conclusion   RPAV lesion is 95% stenosed.  1. 95% stenosis in a large PLV vessel (the RCA has a relatively early bifurcation). 2. Low RA pressure but elevated PCWP. 3. Mild pulmonary venous hypertension. 4. Cardiac output adequate on dobutamine + norepinephrine.  Findings Coronary Findings Diagnostic  Dominance: Right  Left Main Vessel was injected. Vessel is normal in caliber. Vessel is angiographically normal.  Left Anterior Descending Vessel was injected. Vessel is normal in caliber. The vessel exhibits minimal luminal irregularities.  Left Circumflex Vessel was injected. Vessel is normal in caliber. There is mild diffuse disease throughout the vessel.  Right Coronary Artery  Right Posterior Atrioventricular Artery RPAV lesion is 95% stenosed.  Third Right Posterolateral Branch Collaterals 3rd RPL filled by collaterals from Dist LAD.  Intervention  No interventions have been documented.   CARDIAC CATHETERIZATION  CARDIAC CATHETERIZATION 01/23/2016  Conclusion  Mid RCA lesion, 40 %stenosed.  Prox RCA lesion, 10 %stenosed.  Hemodynamic findings consistent with moderate pulmonary  hypertension, mitral valve  regurgitation and aortic valve stenosis.  Non-obstructive CAD. Mildly reduced cardiac index. PA 58%, PCWP 28,  Mean PA 45. BNP was 1100 and CXR shows pulmonary edema. Will admit for diuresis and consult CT surgery to evaluate for AVR. Repeat echocardiogram.  Vinie KYM Maxcy, MD, [facility] Attending Cardiologist [facility]  Findings Coronary Findings Diagnostic  Dominance: Right  Left Main Vessel was injected. Vessel is normal in caliber. Vessel is angiographically normal.  Left Anterior Descending Vessel was injected. Vessel is normal in caliber. The vessel exhibits minimal luminal irregularities.  First Diagonal Branch Vessel was injected. Vessel is normal in size. There is mild  diffuse disease in the vessel.  Left Circumflex Vessel was injected. Vessel is small.  First Obtuse Marginal Branch Vessel is small in size.  Right Coronary Artery Vessel was injected. Vessel is large. The lesion is focal and eccentric. The lesion is moderately calcified. The lesion is focal and eccentric. The lesion is moderately calcified.  Intervention  No interventions have been documented.     ECHOCARDIOGRAM  ECHOCARDIOGRAM COMPLETE 04/19/2024  Narrative ECHOCARDIOGRAM REPORT    Patient Name:   ANDRIEL OMALLEY Date of Exam: 04/19/2024 Medical Rec #:  [id number]       Height:       70.0 in Accession #:    [id number]      Weight:       169.0 lb Date of Birth:  05-22-1956       BSA:          1.943 m Patient Age:    68 years        BP:           78/59 mmHg Patient Gender: M               HR:           71 bpm. Exam Location:  Inpatient  Procedure: 2D Echo, Cardiac Doppler and Color Doppler (Both Spectral and Color Flow Doppler were utilized during procedure).  Indications:    CHF-Acute Systolic I50.21  History:        Patient has prior history of Echocardiogram examinations, most recent 10/22/2023. CHF and Cardiomyopathy, PAD and COPD, Arrythmias:Atrial  Fibrillation and Atrial Flutter, Signs/Symptoms:Shortness of Breath; Risk Factors:Dyslipidemia. Aortic Valve: 25 mm Edwards valve is present in the aortic position. Procedure Date: 2017.  Sonographer:    Thea Norlander RCS Referring Phys: [id number] SHENG L HALEY  IMPRESSIONS   1. Left ventricular ejection fraction, by estimation, is 20 to 25%. The left ventricle has severely decreased function. The left ventricle demonstrates regional wall motion abnormalities (see scoring diagram/findings for description). The left ventricular internal cavity size was moderately to severely dilated. There is mild concentric left ventricular hypertrophy. Left ventricular diastolic parameters are indeterminate. 2. Right ventricular systolic function is normal. The right ventricular size is normal. Tricuspid regurgitation signal is inadequate for assessing PA pressure. 3. Left atrial size was severely dilated. 4. Right atrial size was mild to moderately dilated. 5. The mitral valve is degenerative. Moderate to severe mitral valve regurgitation. No evidence of mitral stenosis. 6. 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 Details below. 7. The aortic valve was not well visualized. Aortic valve regurgitation is mild to moderate. There is a 25 mm Edwards valve present in the aortic position. Procedure Date: 2017. Echo findings are consistent with stenosis of the aortic prosthesis. Aortic valve area, by VTI measures 0.61 cm. Aortic valve mean gradient measures 49.0 mmHg. Aortic valve Vmax measures  4.72 m/s. Aortic valve acceleration time measures 169 msec. 8. Aortic dilatation noted. There is borderline dilatation of the aortic root, measuring 37 mm. 9. The inferior vena cava is normal in size with greater than 50% respiratory variability, suggesting right atrial pressure of 3 mmHg. 10. Cannot exclude a small PFO.  Comparison(s): A prior study was performed on 10/22/2023. The estimated  ejection fraction was 45-50% with hypokinesis of inferior and posterior walls. There was grade II diastolic dysfunction with elevated LAP. There was moderate biatrial enlargement. 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  FINDINGS Left Ventricle: Left ventricular ejection fraction, by estimation, is 20 to 25%. The left ventricle has severely decreased function. The left ventricle demonstrates regional wall motion abnormalities. The left ventricular internal cavity size was moderately to severely dilated. There is mild concentric left ventricular hypertrophy. Left ventricular diastolic parameters are indeterminate.   LV Wall Scoring: The entire inferior wall and posterior wall are hypokinetic.  Right Ventricle: The right ventricular size is normal. No increase in right ventricular wall thickness. Right ventricular systolic function is normal. Tricuspid regurgitation signal is inadequate for assessing PA pressure.  Left Atrium: Left atrial size was severely dilated.  Right Atrium: Right atrial size was mild to moderately dilated.  Pericardium: There is no evidence of pericardial effusion.  Mitral Valve: The mitral valve is degenerative in appearance. Mild mitral annular calcification. Moderate to severe mitral valve regurgitation. No evidence of mitral valve stenosis.  Tricuspid Valve: The tricuspid valve is normal in structure. Tricuspid valve regurgitation is trivial. No evidence of tricuspid stenosis.  Aortic Valve: The aortic valve was not well visualized. Aortic valve regurgitation is mild to moderate. Aortic valve mean gradient measures 49.0 mmHg. Aortic valve peak gradient measures 88.9 mmHg. Aortic valve area, by VTI measures 0.61 cm. There is a 25 mm Edwards valve present in the aortic position. Procedure Date: 2017.  Pulmonic Valve: The pulmonic valve was normal in structure. Pulmonic valve regurgitation is trivial. No  evidence of pulmonic stenosis.  Aorta: Aortic dilatation noted. There is borderline dilatation of the aortic root, measuring 37 mm.  Venous: The inferior vena cava is normal in size with greater than 50% respiratory variability, suggesting right atrial pressure of 3 mmHg.  IAS/Shunts: Cannot exclude a small PFO.  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 Details below.   LEFT VENTRICLE PLAX 2D LVIDd:         6.60 cm   Diastology LVIDs:         5.30 cm   LV e' medial:    4.03 cm/s LV PW:         1.10 cm   LV E/e' medial:  30.5 LV IVS:        1.10 cm   LV e' lateral:   9.14 cm/s LVOT diam:     2.30 cm   LV E/e' lateral: 13.5 LV SV:         68 LV SV Index:   35 LVOT Area:     4.15 cm   RIGHT VENTRICLE             IVC RV S prime:     11.70 cm/s  IVC diam: 1.90 cm TAPSE (M-mode): 2.2 cm  LEFT ATRIUM              Index        RIGHT ATRIUM  Index LA diam:        4.10 cm  2.11 cm/m   RA Area:     21.40 cm LA Vol (A2C):   104.0 ml 53.52 ml/m  RA Volume:   62.70 ml  32.27 ml/m LA Vol (A4C):   106.0 ml 54.55 ml/m LA Biplane Vol: 106.0 ml 54.55 ml/m AORTIC VALVE AV Area (Vmax):    0.60 cm AV Area (Vmean):   0.60 cm AV Area (VTI):     0.61 cm AV Vmax:           471.50 cm/s AV Vmean:          323.000 cm/s AV VTI:            1.115 m AV Peak Grad:      88.9 mmHg AV Mean Grad:      49.0 mmHg LVOT Vmax:         68.00 cm/s LVOT Vmean:        46.900 cm/s LVOT VTI:          0.164 m LVOT/AV VTI ratio: 0.15  AORTA Ao Root diam: 3.70 cm Ao Asc diam:  3.50 cm  MITRAL VALVE MV Area (PHT): 5.02 cm     SHUNTS MV Decel Time: 151 msec     Systemic VTI:  0.16 m MV E velocity: 123.00 cm/s  Systemic Diam: 2.30 cm  Emeline Calender Electronically signed by Emeline Calender Signature Date/Time: 04/19/2024/7:22:45 PM    Final   TEE  ECHO TEE 01/29/2016  Narrative *[facility]* *[facility]* [address] [place], [place] [address] [phone]  ------------------------------------------------------------------- Transesophageal Echocardiography  Patient:    Baily, Serpe MR #:       [id number] Study Date: 01/29/2016 Gender:     M Age:        60 Height:     176.5 cm Weight:     74 kg BSA:        1.91 m^2 Pt. Status: Room:       2W21C  ATTENDING    Obadiah Maude TISA Obadiah Maude BARTON    VanTrigt, Peter ADMITTING    Vinie Maxcy MD PERFORMING   Juliene Clinton, MD SONOGRAPHER  Annabella Fell, RDCS  cc:  ------------------------------------------------------------------- LV EF: 20% -   25%  ------------------------------------------------------------------- Indications:      Aortic stenosis 424.1.  ------------------------------------------------------------------- History:   PMH:  Non-Ischemic Cardiomyopathy. Arterial Hypotension. Coronary artery disease.  Congestive heart failure.  Chronic obstructive pulmonary disease.  ------------------------------------------------------------------- Study Conclusions  - Left ventricle: Systolic function was severely reduced. The estimated ejection fraction was in the range of 20% to 25%. - Aortic valve: Valve area (VTI): 1.25 cm^2. Valve area (Vmax): 1.14 cm^2. Valve area (Vmean): 1.29 cm^2. - Mitral valve: There was mild regurgitation. - Left atrium: The atrium was dilated. No evidence of thrombus in the atrial cavity or appendage. - Right atrium: The atrium was dilated. No evidence of thrombus in the atrial cavity or appendage.  ------------------------------------------------------------------- Study data:   Study status:  Routine.  Consent:  The risks, benefits, and alternatives to the procedure were explained to the patient and informed consent was obtained.  Procedure:  The patient reported no pain pre or post test. Initial setup. The patient was brought to the laboratory. Surface ECG leads were  monitored. Sedation. Conscious sedation was administered. General anesthesia was administered. TEE probe inserted atraumatically by anesthesiologist.  Study completion:  The patient tolerated the procedure well.  There were no complications.          Diagnostic transesophageal echocardiography.  2D and color Doppler. Birthdate:  Patient birthdate: 1955-07-22.  Age:  Patient is 68 yr old.  Sex:  Gender: male.    BMI: 23.8 kg/m^2.  Blood pressure: 108/73  Patient status:  Inpatient.  Study date:  Study date: 01/29/2016. Study time: 09:53 AM.  Location:  Operating room.  -------------------------------------------------------------------  ------------------------------------------------------------------- Left ventricle:  Systolic function was severely reduced. The estimated ejection fraction was in the range of 20% to 25%. Diffuse hypokinesis. Dilated.  ------------------------------------------------------------------- Aortic valve:  Severe stenosis. Heavily calcified. Mild regurgitation. Post CPB, prosthetic aortic valve is visualized. Seated properly with no evidence of regurgitation or stenosis. Doppler:     VTI ratio of LVOT to aortic valve: 0.3. Valve area (VTI): 1.25 cm^2. Indexed valve area (VTI): 0.65 cm^2/m^2. Peak velocity ratio of LVOT to aortic valve: 0.28. Valve area (Vmax): 1.14 cm^2. Indexed valve area (Vmax): 0.6 cm^2/m^2. Mean velocity ratio of LVOT to aortic valve: 0.31. Valve area (Vmean): 1.29 cm^2. Indexed valve area (Vmean): 0.68 cm^2/m^2.    Mean gradient (S): 22 mm Hg. Peak gradient (S): 45 mm Hg.  ------------------------------------------------------------------- Aorta:  There was no atheroma. There was no evidence for dissection. Aortic root: The aortic root was not dilated. Ascending aorta: The ascending aorta was normal in size. Aortic arch: The aortic arch was normal in size. Descending aorta: The descending aorta was normal in  size.  ------------------------------------------------------------------- Mitral valve:   Structurally normal valve.   Leaflet separation was normal.  Doppler:  There was mild regurgitation.  ------------------------------------------------------------------- Left atrium:  The atrium was dilated.  No evidence of thrombus in the atrial cavity or appendage. The appendage was morphologically a left appendage, multilobulated, and of normal size. Emptying velocity was normal.  ------------------------------------------------------------------- Right ventricle:  The cavity size was normal. Wall thickness was normal. Systolic function was normal.  ------------------------------------------------------------------- Pulmonic valve:    Structurally normal valve.  ------------------------------------------------------------------- Tricuspid valve:   Structurally normal valve.   Leaflet separation was normal.  Doppler:  There was no significant regurgitation.  ------------------------------------------------------------------- Pulmonary artery:   The main pulmonary artery was normal-sized.  ------------------------------------------------------------------- Right atrium:  The atrium was dilated.  No evidence of thrombus in the atrial cavity or appendage. The appendage was morphologically a right appendage.  ------------------------------------------------------------------- Pericardium:  There was no pericardial effusion.  ------------------------------------------------------------------- Measurements  Left ventricle                           Value Stroke volume, 2D                        105   ml Stroke volume/bsa, 2D                    55    ml/m^2  LVOT                                     Value LVOT ID, S                               23    mm LVOT area  4.15  cm^2 LVOT peak velocity, S                    92.5  cm/s LVOT mean velocity, S                     65.4  cm/s LVOT VTI, S                              25.2  cm  Aortic valve                             Value Aortic valve peak velocity, S            336   cm/s Aortic valve mean velocity, S            210   cm/s Aortic valve VTI, S                      83.9  cm Aortic mean gradient, S                  22    mm Hg Aortic peak gradient, S                  45    mm Hg VTI ratio, LVOT/AV                       0.3 Aortic valve area, VTI                   1.25  cm^2 Aortic valve area/bsa, VTI               0.65  cm^2/m^2 Velocity ratio, peak, LVOT/AV            0.28 Aortic valve area, peak velocity         1.14  cm^2 Aortic valve area/bsa, peak velocity     0.6   cm^2/m^2 Velocity ratio, mean, LVOT/AV            0.31 Aortic valve area, mean velocity         1.29  cm^2 Aortic valve area/bsa, mean velocity     0.68  cm^2/m^2  Legend: (L)  and  (H)  mark values outside specified reference range.  ------------------------------------------------------------------- Prepared and Electronically Authenticated by  Juliene Clinton, MD 2017-08-15T08:40:04    CT SCANS  CT CORONARY MORPH W/CTA COR W/SCORE 04/21/2024  Addendum 04/21/2024  6:59 PM ADDENDUM REPORT: 04/21/2024 18:57  EXAM: OVER-READ INTERPRETATION CT CHEST  The following report is an over-read performed by radiologist Dr. Rogelia Myers of [facility] on 04/21/2024. This over-read does not include interpretation of cardiac or coronary anatomy or pathology. The coronary CTA interpretation by the cardiologist is attached.  COMPARISON:  04/20/2024  FINDINGS: Pulmonary Embolism: No pulmonary embolism. Pulmonary artery catheter in place terminating in a segmental branch of the right middle lobe.  Cardiovascular: Normal appearance of extracardiac vascular structures. Dense multi-vessel coronary atherosclerosis. Aortic valve replacement. No pericardial effusion. No aortic aneurysm. Calcified plaque  throughout the visualized aorta.  Mediastinum/Nodes: No mediastinal mass. No mediastinal or hilar lymphadenopathy. Normal esophagus.  Lungs/Pleura: The midline trachea and bronchi are patent. Emphysema. Intralobular septal thickening predominantly within the lower lobes. Moderate bilateral pleural effusions with compressive atelectasis in both lower lobes. No pneumothorax.  Musculoskeletal: No acute fracture or destructive bone lesion. Sternotomy wires. No sternal dehiscence. Multilevel degenerative disc disease of the spine.  Upper Abdomen: No acute abnormality in the partially visualized upper abdomen.  IMPRESSION: 1. Mild cardiomegaly with findings of interstitial edema. Moderate volume bilateral pleural effusions, larger on the right than the left with bibasilar compressive atelectasis. 2. Pulmonary artery catheter in place terminating in a segmental branch of the right middle lobe.   Electronically Signed By: Rogelia Myers M.D. On: 04/21/2024 18:57  Narrative CLINICAL DATA:  Prosthetic aortic valve pathology with assessment  EXAM: Cardiac TAVR CT  TECHNIQUE: The patient was scanned on a Siemens Force 192 slice scanner. A 120 kV retrospective scan was triggered in the descending thoracic aorta at 111 HU's. Gantry rotation speed was 270 msecs and collimation was .9 mm. No beta blockade or nitro were given. Fair image quality- imaged at heart rate of 86 bpm. The 3D data set was reconstructed in 5% intervals of the R-R cycle. Systolic and diastolic phases were analyzed on a dedicated work station using MPR, MIP and VRT modes. The patient received 100 cc of contrast.  FINDINGS: Valve Characteristics  Manufacturer and model: Edwards Magna Ease  Size: 25 mm  Year of implantation: 2017  Prosthetic Valve:  Hypoattenuation and degree: Calcification of all prosthetic leaflets. Severe restriction of the non and left prosthetic leaflet. Restriction of the right  prosthetic leaflet.  Level of coronary ostia above stent posts: NA  Optimum Fluoroscopic Angle for Delivery: LAO 11, CAU 11  Prosthetic Valve- Left Main Coronary angle: RAO 0, CAU 21  Valves for structural team consideration via valve in valve selector (THV current- assuming no fracture):  26 mm Sapien Valve is preferred.  RCA virtual valve to coronary distance: 5 mm  LM virtual valve to coronary distance: 7 mm  29 mm Evolut Valve  RCA virtual valve to coronary distance: 4 mm  LM virtual valve to coronary distance: 5 mm  Non prosthetic valve findings valve Findings:  Coronary Arteries: Normal coronary origin. Study not completed with nitroglycerin . Known coronary artery disease. CAC scoring deferred.  Aorta: Ascending aorta 38 mm, aortic atherosclerosis is prominent.  Main Pulmonary artery: Mild dilation 30 mm Swan Ganz catheter with unclear termination, course into the main trunk of the right pulmonary artery is noted.  Systemic veins: Normal anatomy.  Pulmonary veins: Normal anatomy.  Left atrial appendage: Patent.  Interatrial septum: No communications.  Chamber assessment: Left ventricular dilation with globular remodeling. Right ventricular dilation.  Pericardium: No pericardial effusion.  Extra Cardiac Findings as per separate reporting.  IMPRESSION: 1. Prosthetic Aortic stenosis. Findings pertinent to TAVR procedure are detailed above.  Mahesh  Chandrasekhar  Electronically Signed: By: Stanly Leavens M.D. On: 04/21/2024 17:04     ______________________________________________________________________________________________      Laboratory Data:  High Sensitivity Troponin:   Recent Labs  Lab 03/25/24 1359 03/25/24 1550 04/19/24 1133 04/19/24 1354 04/19/24 1811  TROPONINIHS 85* 89* 218* 259* 268*     Chemistry Recent Labs  Lab 04/21/24 0502 04/22/24 0452 04/23/24 0313  NA 133* 134* 134*  K 3.8 3.8 4.0  CL 103 100 97*   CO2 22 25 26   GLUCOSE 178* 155* 118*  BUN 21 20 21   CREATININE 0.68 0.76 0.71  CALCIUM  8.3* 8.3* 8.8*  GFRNONAA >60 >60 >60  ANIONGAP 8 9 11     Recent Labs  Lab 04/21/24 0502 04/22/24 0452 04/23/24 0313  PROT 5.9* 6.0* 6.1*  ALBUMIN  3.1* 3.1*  3.2*  AST 18 20 20   ALT 17 17 18   ALKPHOS 37* 34* 36*  BILITOT 1.1 0.9 0.9   Hematology Recent Labs  Lab 04/21/24 0502 04/22/24 0452 04/23/24 0313  WBC 7.1 6.2 5.7  RBC 4.27 4.12* 4.29  HGB 14.4 13.9 14.4  HCT 42.5 40.9 42.6  MCV 99.5 99.3 99.3  MCH 33.7 33.7 33.6  MCHC 33.9 34.0 33.8  RDW 14.3 14.0 13.8  PLT 123* 106* 111*   BNP Recent Labs  Lab 04/19/24 1133  BNP 1,400.2*    DDimer No results for input(s): DDIMER in the last 168 hours.   Radiology/Studies:  MR BRAIN W WO CONTRAST Result Date: 04/22/2024 EXAM: MRI BRAIN WITH AND WITHOUT CONTRAST 04/22/2024 01:16:26 PM TECHNIQUE: Multiplanar multisequence MRI of the head/brain was performed with and without the administration of intravenous contrast. COMPARISON: None available. CLINICAL HISTORY: Brain metastases, unknown primary. FINDINGS: BRAIN AND VENTRICLES: There is no evidence of an acute infarct, mass, midline shift, hydrocephalus, or extra axial fluid collection. A focus of susceptibility in the region of the inferior 4th ventricle may reflect chronic hemorrhage or calcification. No intracranial hemorrhage is evident elsewhere. Scattered small T2 hyperintensities in the cerebral white matter bilaterally are nonspecific but compatible with mild chronic small vessel ischemic disease. No abnormal enhancement is identified. Mild cerebral atrophy is within normal limits for age. A normal variant cavum septum pellucidum et vergae is incidentally noted. Major intracranial vascular flow voids are preserved. ORBITS: Bilateral cataract extraction. SINUSES: Mucosal thickening and a moderately large amount of fluid in the right maxillary sinus. Clear mastoid air cells. BONES AND  SOFT TISSUES: Normal bone marrow signal and enhancement. No acute soft tissue abnormality. IMPRESSION: 1. No acute intracranial abnormality or evidence of metastatic disease. 2. Right maxillary sinus fluid, correlate for acute sinusitis. Electronically signed by: Dasie Hamburg MD 04/22/2024 01:50 PM EDT RP Workstation: [id number]   DG CHEST PORT 1 VIEW Result Date: 04/22/2024 CLINICAL DATA:  Migration of central line EXAM: PORTABLE CHEST 1 VIEW COMPARISON:  04/21/2024, 04/20/2024 FINDINGS: Right IJ Swan-Ganz catheter has been retracted, the tip is directed to the right in the region of the central pulmonary artery. Right upper extremity central venous catheter tip partially obscured by the right IJ line, suspect that the tip overlies the SVC. Sternotomy and valve prosthesis. Stable cardiomediastinal silhouette with aortic atherosclerosis and pulmonary edema. Pleural effusions and basilar airspace disease. CT demonstrated spiculated left apical nodule not well seen radiographically. IMPRESSION: 1. Right IJ Swan-Ganz catheter has been retracted, the tip is directed to the right in the region of the central pulmonary artery. 2. Right upper extremity central venous catheter tip is partially obscured by the right IJ line, suspect that the tip overlies the SVC. 3. Pulmonary edema with pleural effusions and basilar airspace disease. Electronically Signed   By: Luke Bun M.D.   On: 04/22/2024 00:01   CT CORONARY MORPH W/CTA COR W/SCORE W/CA W/CM &/OR WO/CM Addendum Date: 04/21/2024 ADDENDUM REPORT: 04/21/2024 18:57 EXAM: OVER-READ INTERPRETATION CT CHEST The following report is an over-read performed by radiologist Dr. Rogelia Myers of [facility] on 04/21/2024. This over-read does not include interpretation of cardiac or coronary anatomy or pathology. The coronary CTA interpretation by the cardiologist is attached. COMPARISON:  04/20/2024 FINDINGS: Pulmonary Embolism: No pulmonary embolism.  Pulmonary artery catheter in place terminating in a segmental branch of the right middle lobe. Cardiovascular: Normal appearance of extracardiac vascular structures. Dense multi-vessel coronary atherosclerosis. Aortic valve replacement. No pericardial effusion. No  aortic aneurysm. Calcified plaque throughout the visualized aorta. Mediastinum/Nodes: No mediastinal mass. No mediastinal or hilar lymphadenopathy. Normal esophagus. Lungs/Pleura: The midline trachea and bronchi are patent. Emphysema. Intralobular septal thickening predominantly within the lower lobes. Moderate bilateral pleural effusions with compressive atelectasis in both lower lobes. No pneumothorax. Musculoskeletal: No acute fracture or destructive bone lesion. Sternotomy wires. No sternal dehiscence. Multilevel degenerative disc disease of the spine. Upper Abdomen: No acute abnormality in the partially visualized upper abdomen. IMPRESSION: 1. Mild cardiomegaly with findings of interstitial edema. Moderate volume bilateral pleural effusions, larger on the right than the left with bibasilar compressive atelectasis. 2. Pulmonary artery catheter in place terminating in a segmental branch of the right middle lobe. Electronically Signed   By: Rogelia Myers M.D.   On: 04/21/2024 18:57   Result Date: 04/21/2024 CLINICAL DATA:  Prosthetic aortic valve pathology with assessment EXAM: Cardiac TAVR CT TECHNIQUE: The patient was scanned on a Siemens Force 192 slice scanner. A 120 kV retrospective scan was triggered in the descending thoracic aorta at 111 HU's. Gantry rotation speed was 270 msecs and collimation was .9 mm. No beta blockade or nitro were given. Fair image quality- imaged at heart rate of 86 bpm. The 3D data set was reconstructed in 5% intervals of the R-R cycle. Systolic and diastolic phases were analyzed on a dedicated work station using MPR, MIP and VRT modes. The patient received 100 cc of contrast. FINDINGS: Valve Characteristics  Manufacturer and model: Edwards Magna Ease Size: 25 mm Year of implantation: 2017 Prosthetic Valve: Hypoattenuation and degree: Calcification of all prosthetic leaflets. Severe restriction of the non and left prosthetic leaflet. Restriction of the right prosthetic leaflet. Level of coronary ostia above stent posts: NA Optimum Fluoroscopic Angle for Delivery: LAO 11, CAU 11 Prosthetic Valve- Left Main Coronary angle: RAO 0, CAU 21 Valves for structural team consideration via valve in valve selector (THV current- assuming no fracture): 26 mm Sapien Valve is preferred. RCA virtual valve to coronary distance: 5 mm LM virtual valve to coronary distance: 7 mm 29 mm Evolut Valve RCA virtual valve to coronary distance: 4 mm LM virtual valve to coronary distance: 5 mm Non prosthetic valve findings valve Findings: Coronary Arteries: Normal coronary origin. Study not completed with nitroglycerin . Known coronary artery disease. CAC scoring deferred. Aorta: Ascending aorta 38 mm, aortic atherosclerosis is prominent. Main Pulmonary artery: Mild dilation 30 mm Swan Ganz catheter with unclear termination, course into the main trunk of the right pulmonary artery is noted. Systemic veins: Normal anatomy. Pulmonary veins: Normal anatomy. Left atrial appendage: Patent. Interatrial septum: No communications. Chamber assessment: Left ventricular dilation with globular remodeling. Right ventricular dilation. Pericardium: No pericardial effusion. Extra Cardiac Findings as per separate reporting. IMPRESSION: 1. Prosthetic Aortic stenosis. Findings pertinent to TAVR procedure are detailed above. Mahesh  Chandrasekhar Electronically Signed: By: Stanly Leavens M.D. On: 04/21/2024 17:04   DG CHEST PORT 1 VIEW Result Date: 04/21/2024 CLINICAL DATA:  Congestive heart failure, malpositioned Swan-Ganz catheter EXAM: PORTABLE CHEST 1 VIEW COMPARISON:  04/21/2024 x-ray and CT FINDINGS: Two frontal views of the chest demonstrate  postsurgical changes from median sternotomy and aortic valve replacement. Right-sided PICC tip overlies superior vena cava. Right internal jugular flow directed central venous catheter is unchanged in position, with tip in the region of the distal aspect of the right middle lobe pulmonary artery as noted on earlier CT. Recommend further retraction to a more central location prior to balloon inflation. The cardiac silhouette remains enlarged. Continued findings  of pulmonary vascular congestion, pulmonary edema, and bilateral pleural effusions. The spiculated left apical pulmonary nodule seen on CT is obscured by overlapping shadows. No pneumothorax. IMPRESSION: 1. No change in position of the tip of the flow directed central venous catheter, likely still within the distal aspect of the right middle lobe pulmonary artery. Recommend retraction to a more central location prior to balloon inflation. 2. Stable pulmonary edema and bilateral effusions. 3. The spiculated left apical pulmonary nodule seen on earlier CT is obscured by overlying structures. These results will be called to the ordering clinician or representative by the Radiologist Assistant, and communication documented in the PACS or [employer]. Electronically Signed   By: Ozell Daring M.D.   On: 04/21/2024 18:56   CT Angio Abd/Pel w/ and/or w/o Addendum Date: 04/21/2024 ADDENDUM REPORT: 04/21/2024 16:15 ADDENDUM: These results were called by telephone at the time of interpretation on 04/21/2024 at 4:15 pm to provider KATHRYN THOMPSON , who verbally acknowledged these results. Electronically Signed   By: Wilkie Lent M.D.   On: 04/21/2024 16:15   Result Date: 04/21/2024 CLINICAL DATA:  Severe aortic valvular disease. Preoperative planning prior to TAVR. EXAM: CT ANGIOGRAPHY CHEST, ABDOMEN AND PELVIS TECHNIQUE: Non-contrast CT of the chest was initially obtained. Multidetector CT imaging through the chest, abdomen and pelvis was performed  using the standard protocol during bolus administration of intravenous contrast. Multiplanar reconstructed images and MIPs were obtained and reviewed to evaluate the vascular anatomy. RADIATION DOSE REDUCTION: This exam was performed according to the departmental dose-optimization program which includes automated exposure control, adjustment of the mA and/or kV according to patient size and/or use of iterative reconstruction technique. CONTRAST:  100mL OMNIPAQUE IOHEXOL 350 MG/ML SOLN COMPARISON:  None Available. FINDINGS: CTA CHEST FINDINGS Cardiovascular: Conventional 3 vessel arch anatomy. Calcified atherosclerotic plaque results in approximately 40% narrowing of the origin of the left subclavian artery. Patient is status post median sternotomy with evidence of prior mechanical aortic valve replacement. Calcifications are present throughout the coronary arteries. The aortic root measures 3.9 cm at the sinuses of Valsalva. Mildly ectatic but nonaneurysmal ascending thoracic aorta with a maximal diameter of 3.8 cm. Scattered atherosclerotic plaque throughout the aorta. Cardiomegaly. A right IJ vascular sheath conveys a Swan-Ganz catheter into the heart. The tip of the Norva is within the right middle lobe pulmonary artery. Calcifications present throughout the coronary arteries. No pericardial effusion. Right upper extremity PICC present. The tip of the catheter lies in the distal SVC. Mediastinum/Nodes: No enlarged mediastinal, hilar, or axillary lymph nodes. Thyroid gland, trachea, and esophagus demonstrate no significant findings. Lungs/Pleura: Moderate bilateral layering pleural effusions slightly worse on the right than the left. Spiculated nodule in the left lung apex measures 2.6 x 1.7 cm. Advanced centrilobular pulmonary emphysema. Diffuse bronchial wall thickening. Focus of atelectasis versus infiltrate in the posterior right upper lobe just anterior to the minor fissure. Dependent atelectasis in both lower  lobes. Musculoskeletal: Healed median sternotomy. No acute fracture or aggressive appearing lytic or blastic osseous lesion. Review of the MIP images confirms the above findings. CTA ABDOMEN AND PELVIS FINDINGS VASCULAR Aorta: Normal in caliber. No aneurysm or dissection. Extensive calcified atherosclerotic plaque. Celiac: Patent without evidence of aneurysm, dissection, vasculitis or significant stenosis. Variant anatomy. The common hepatic artery is replaced to the SMA. SMA: Patent without evidence of aneurysm, dissection, vasculitis or significant stenosis. Replaced common hepatic artery. Renals: Both renal arteries are patent without evidence of aneurysm, dissection, vasculitis, fibromuscular dysplasia or significant stenosis. IMA: Patent  without evidence of aneurysm, dissection, vasculitis or significant stenosis. Inflow: Severe bulky calcified atherosclerotic plaque at the origin of the bilateral common iliac arteries resulting in significant stenosis bilaterally. Stenosis is likely critical on the left and relatively high-grade on the right leaving a luminal diameter of only 5 mm. 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 At least moderate stenosis of the origin of the right internal iliac artery. The external iliac arteries are patent but small in caliber on the left likely due to the more proximal common iliac artery stenosis. Left external iliac artery measures no more than 4 mm. Heavily calcified atherosclerotic plaque also present in the common femoral arteries bilaterally. Surgical changes suggest prior left common femoral cutdown and endarterectomy. Veins: No focal venous abnormality. Review of the MIP images confirms the above findings. NON-VASCULAR Hepatobiliary: No focal liver abnormality is seen. No gallstones, gallbladder wall thickening, or biliary dilatation. Pancreas: Unremarkable. No pancreatic ductal dilatation or surrounding inflammatory changes. Spleen: No splenic injury or  perisplenic hematoma. Adrenals/Urinary Tract: Low-attenuation adreniform thickening of both adrenal glands consistent with adrenal hyperplasia. There is also likely some component of benign adenoma as well on the left. No hydronephrosis, nephrolithiasis or enhancing renal mass in either kidney. The ureters and bladder are unremarkable. Stomach/Bowel: Colonic diverticular disease without CT evidence of active inflammation. No focal bowel wall thickening or evidence of obstruction. Lymphatic: No suspicious lymphadenopathy. Reproductive: Prostatomegaly with indentation of the bladder base. Other: No abdominal wall hernia or abnormality. No abdominopelvic ascites. Musculoskeletal: No acute fracture or aggressive appearing lytic or blastic osseous lesion. Multilevel degenerative changes. Review of the MIP images confirms the above findings. IMPRESSION: VASCULAR 1. The tip of the Swan-Ganz catheter is fairly distal in the right middle lobe pulmonary artery. Recommend pulling back before inflating the balloon to prevent injury to the right middle lobe pulmonary artery. 2. Severe heavily calcified atherosclerotic plaque in the bilateral common iliac arteries resulting in critical stenosis on the left and at least moderate stenosis on the right. 3. The left external iliac artery is relatively small in caliber with a lumen measuring only 4 mm. 4. Bulky calcified atherosclerotic plaque as well in the bilateral common femoral arteries although there are surgical changes suggesting prior left common femoral endarterectomy with a trace residual non flow limiting dissection flap. 5. Surgical changes of prior mechanical aortic valve replacement. The aortic root is normal in caliber at 3.9 cm. 6. Mild ectasia of the ascending thoracic aorta without aneurysm. Maximal diameter 3.7 cm. 7. Right upper extremity PICC is well position with the tip in the mid SVC. 8. 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 9. Incidental note is made of replaced common hepatic artery to the SMA. NON VASCULAR 1. Spiculated left apical pulmonary nodule measuring up to 2.6 cm. Differential considerations include primary bronchogenic carcinoma versus masslike pleuroparenchymal scarring. Recommend further evaluation with nonemergent PET-CT. If hypermetabolic, then recommend referral to the multi disciplinary thoracic oncology conference. 2. Moderate bilateral layering pleural effusions larger on the right than the left. 3. Focal dependent atelectasis versus infiltrate within the posterior right upper lobe. 4. Advanced centrilobular pulmonary emphysema and diffuse chronic bronchial wall thickening. Findings suggest underlying COPD. 5. Prostatomegaly with indentation of the bladder base. Malignancy is difficult to exclude entirely. Recommend non emergent urology referral for cystoscopy and further evaluation in the future. 6. Additional ancillary findings as above. Electronically Signed: By: Wilkie Lent M.D. On: 04/21/2024 15:47  CT ANGIO CHEST AORTA W/CM & OR WO/CM Addendum Date: 04/21/2024 ADDENDUM REPORT: 04/21/2024 16:15 ADDENDUM: These results were called by telephone at the time of interpretation on 04/21/2024 at 4:15 pm to provider KATHRYN THOMPSON , who verbally acknowledged these results. Electronically Signed   By: Wilkie Lent M.D.   On: 04/21/2024 16:15   Result Date: 04/21/2024 CLINICAL DATA:  Severe aortic valvular disease. Preoperative planning prior to TAVR. EXAM: CT ANGIOGRAPHY CHEST, ABDOMEN AND PELVIS TECHNIQUE: Non-contrast CT of the chest was initially obtained. Multidetector CT imaging through the chest, abdomen and pelvis was performed using the standard protocol during bolus administration of intravenous contrast. Multiplanar reconstructed images and MIPs were obtained and reviewed to evaluate the vascular anatomy. RADIATION DOSE REDUCTION: This exam was performed  according to the departmental dose-optimization program which includes automated exposure control, adjustment of the mA and/or kV according to patient size and/or use of iterative reconstruction technique. CONTRAST:  100mL OMNIPAQUE IOHEXOL 350 MG/ML SOLN COMPARISON:  None Available. FINDINGS: CTA CHEST FINDINGS Cardiovascular: Conventional 3 vessel arch anatomy. Calcified atherosclerotic plaque results in approximately 40% narrowing of the origin of the left subclavian artery. Patient is status post median sternotomy with evidence of prior mechanical aortic valve replacement. Calcifications are present throughout the coronary arteries. The aortic root measures 3.9 cm at the sinuses of Valsalva. Mildly ectatic but nonaneurysmal ascending thoracic aorta with a maximal diameter of 3.8 cm. Scattered atherosclerotic plaque throughout the aorta. Cardiomegaly. A right IJ vascular sheath conveys a Swan-Ganz catheter into the heart. The tip of the Norva is within the right middle lobe pulmonary artery. Calcifications present throughout the coronary arteries. No pericardial effusion. Right upper extremity PICC present. The tip of the catheter lies in the distal SVC. Mediastinum/Nodes: No enlarged mediastinal, hilar, or axillary lymph nodes. Thyroid gland, trachea, and esophagus demonstrate no significant findings. Lungs/Pleura: Moderate bilateral layering pleural effusions slightly worse on the right than the left. Spiculated nodule in the left lung apex measures 2.6 x 1.7 cm. Advanced centrilobular pulmonary emphysema. Diffuse bronchial wall thickening. Focus of atelectasis versus infiltrate in the posterior right upper lobe just anterior to the minor fissure. Dependent atelectasis in both lower lobes. Musculoskeletal: Healed median sternotomy. No acute fracture or aggressive appearing lytic or blastic osseous lesion. Review of the MIP images confirms the above findings. CTA ABDOMEN AND PELVIS FINDINGS VASCULAR Aorta: Normal  in caliber. No aneurysm or dissection. Extensive calcified atherosclerotic plaque. Celiac: Patent without evidence of aneurysm, dissection, vasculitis or significant stenosis. Variant anatomy. The common hepatic artery is replaced to the SMA. SMA: Patent without evidence of aneurysm, dissection, vasculitis or significant stenosis. Replaced common hepatic artery. Renals: Both renal arteries are patent without evidence of aneurysm, dissection, vasculitis, fibromuscular dysplasia or significant stenosis. IMA: Patent without evidence of aneurysm, dissection, vasculitis or significant stenosis. Inflow: Severe bulky calcified atherosclerotic plaque at the origin of the bilateral common iliac arteries resulting in significant stenosis bilaterally. Stenosis is likely critical on the left and relatively high-grade on the right leaving a luminal diameter of only 5 mm. 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 At least moderate stenosis of the origin of the right internal iliac artery. The external iliac arteries are patent but small in caliber on the left likely due to the more proximal common iliac artery stenosis. Left external iliac artery measures no more than 4 mm. Heavily calcified atherosclerotic plaque also present in the common femoral arteries bilaterally. Surgical changes suggest prior left common femoral cutdown and endarterectomy. Veins:  No focal venous abnormality. Review of the MIP images confirms the above findings. NON-VASCULAR Hepatobiliary: No focal liver abnormality is seen. No gallstones, gallbladder wall thickening, or biliary dilatation. Pancreas: Unremarkable. No pancreatic ductal dilatation or surrounding inflammatory changes. Spleen: No splenic injury or perisplenic hematoma. Adrenals/Urinary Tract: Low-attenuation adreniform thickening of both adrenal glands consistent with adrenal hyperplasia. There is also likely some component of benign adenoma as well on the left. No hydronephrosis,  nephrolithiasis or enhancing renal mass in either kidney. The ureters and bladder are unremarkable. Stomach/Bowel: Colonic diverticular disease without CT evidence of active inflammation. No focal bowel wall thickening or evidence of obstruction. Lymphatic: No suspicious lymphadenopathy. Reproductive: Prostatomegaly with indentation of the bladder base. Other: No abdominal wall hernia or abnormality. No abdominopelvic ascites. Musculoskeletal: No acute fracture or aggressive appearing lytic or blastic osseous lesion. Multilevel degenerative changes. Review of the MIP images confirms the above findings. IMPRESSION: VASCULAR 1. The tip of the Swan-Ganz catheter is fairly distal in the right middle lobe pulmonary artery. Recommend pulling back before inflating the balloon to prevent injury to the right middle lobe pulmonary artery. 2. Severe heavily calcified atherosclerotic plaque in the bilateral common iliac arteries resulting in critical stenosis on the left and at least moderate stenosis on the right. 3. The left external iliac artery is relatively small in caliber with a lumen measuring only 4 mm. 4. Bulky calcified atherosclerotic plaque as well in the bilateral common femoral arteries although there are surgical changes suggesting prior left common femoral endarterectomy with a trace residual non flow limiting dissection flap. 5. Surgical changes of prior mechanical aortic valve replacement. The aortic root is normal in caliber at 3.9 cm. 6. Mild ectasia of the ascending thoracic aorta without aneurysm. Maximal diameter 3.7 cm. 7. Right upper extremity PICC is well position with the tip in the mid SVC. 8. Mild to moderate stenosis of the origin of the left subclavian artery due to calcified atherosclerotic plaque. 9. Incidental note is made of replaced common hepatic artery to the SMA. NON VASCULAR 1. Spiculated left apical pulmonary nodule measuring up to 2.6 cm. Differential considerations include primary  bronchogenic carcinoma versus masslike pleuroparenchymal scarring. Recommend further evaluation with nonemergent PET-CT. If hypermetabolic, then recommend referral to the multi disciplinary thoracic oncology conference. 2. Moderate bilateral layering pleural effusions larger on the right than the left. 3. Focal dependent atelectasis versus infiltrate within the posterior right upper lobe. 4. Advanced centrilobular pulmonary emphysema and diffuse chronic bronchial wall thickening. Findings suggest underlying COPD. 5. Prostatomegaly with indentation of the bladder base. Malignancy is difficult to exclude entirely. Recommend non emergent urology referral for cystoscopy and further evaluation in the future. 6. Additional ancillary findings as above. Electronically Signed: By: Wilkie Lent M.D. On: 04/21/2024 15:47   DG CHEST PORT 1 VIEW Result Date: 04/21/2024 EXAM: 1 VIEW XRAY OF THE CHEST 04/21/2024 09:17:00 AM COMPARISON: 04/20/2024 CLINICAL HISTORY: CHF; swan position FINDINGS: LINES, TUBES AND DEVICES: Right IJ Swan-Ganz catheter in place with tip terminating over the right lower lobe pulmonary artery, recommend retraction. Right PICC in place with tip terminating over the superior cavoatrial junction, unchanged. Cardiac monitoring leads noted. LUNGS AND PLEURA: Diffuse interstitial opacities. Bibasilar patchy opacities. Bilateral pleural effusions, layering. No pneumothorax. HEART AND MEDIASTINUM: Cardiomegaly, unchanged. Median sternotomy and cardiac valve replacement noted. Atherosclerotic calcifications of the aorta. BONES AND SOFT TISSUES: No acute osseous abnormality. IMPRESSION: 1. Right IJ Swan-Ganz catheter with tip in the right lower lobe pulmonary artery, recommend retraction. 2. Pulmonary edema,  similar to prior. 3. Similar bibasilar and left retrocardiac opacities. 4. Bilateral layering pleural effusions. 5. Right PICC with tip at the superior cavoatrial junction, unchanged. Electronically  signed by: Donnice Mania MD 04/21/2024 02:06 PM EDT RP Workstation: [id number]   CARDIAC CATHETERIZATION Result Date: 04/20/2024   RPAV lesion is 95% stenosed. 1. 95% stenosis in a large PLV vessel (the RCA has a relatively early bifurcation). 2. Low RA pressure but elevated PCWP. 3. Mild pulmonary venous hypertension. 4. Cardiac output adequate on dobutamine + norepinephrine.   DG CHEST PORT 1 VIEW Result Date: 04/20/2024 CLINICAL DATA:  Shortness of breath EXAM: PORTABLE CHEST 1 VIEW COMPARISON:  Chest radiograph dated 04/19/2024 FINDINGS: Lines/tubes: Right upper extremity PICC tip projects over the SVC. Lungs: Well inflated lungs. Increased diffuse interstitial opacities and bibasilar patchy and dense left retrocardiac opacity. Pleura: Slightly increased small bilateral pleural effusions. No pneumothorax. Heart/mediastinum: Similar enlarged, postsurgical cardiomediastinal silhouette. Bones: Median sternotomy wires are nondisplaced. Degenerative changes of the right shoulder. IMPRESSION: 1. Interval placement of right upper extremity PICC with tip projecting over the SVC. No pneumothorax. 2. Increased pulmonary edema. 3. Slightly increased small bilateral pleural effusions. 4. Bibasilar patchy and dense left retrocardiac opacity, likely a combination of pulmonary edema and atelectasis. Electronically Signed   By: Limin  Xu M.D.   On: 04/20/2024 09:30   US  EKG SITE RITE Result Date: 04/19/2024 If Site Rite image not attached, placement could not be confirmed due to current cardiac rhythm.  ECHOCARDIOGRAM COMPLETE Result Date: 04/19/2024    ECHOCARDIOGRAM REPORT   Patient Name:   MCLANE ARORA Date of Exam: 04/19/2024 Medical Rec #:  [id number]       Height:       70.0 in Accession #:    [id number]      Weight:       169.0 lb Date of Birth:  [birth year]       BSA:          1.943 m Patient Age:    68 years        BP:           78/59 mmHg Patient Gender: M               HR:           71 bpm. Exam  Location:  Inpatient Procedure: 2D Echo, Cardiac Doppler and Color Doppler (Both Spectral and Color            Flow Doppler were utilized during procedure). Indications:    CHF-Acute Systolic I50.21  History:        Patient has prior history of Echocardiogram examinations, most                 recent 10/22/2023. CHF and Cardiomyopathy, PAD and COPD,                 Arrythmias:Atrial Fibrillation and Atrial Flutter,                 Signs/Symptoms:Shortness of Breath; Risk Factors:Dyslipidemia.                 Aortic Valve: 25 mm Edwards valve is present in the aortic                 position. Procedure Date: 2017.  Sonographer:    Thea Norlander RCS Referring Phys: [id number] SHENG L HALEY IMPRESSIONS  1. Left ventricular ejection fraction, by estimation, is 20 to 25%. The left ventricle has severely  decreased function. The left ventricle demonstrates regional wall motion abnormalities (see scoring diagram/findings for description). The left ventricular internal cavity size was moderately to severely dilated. There is mild concentric left ventricular hypertrophy. Left ventricular diastolic parameters are indeterminate.  2. Right ventricular systolic function is normal. The right ventricular size is normal. Tricuspid regurgitation signal is inadequate for assessing PA pressure.  3. Left atrial size was severely dilated.  4. Right atrial size was mild to moderately dilated.  5. The mitral valve is degenerative. Moderate to severe mitral valve regurgitation. No evidence of mitral stenosis.  6. 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 Details below.  7. The aortic valve was not well visualized. Aortic valve regurgitation is mild to moderate. There is a 25 mm Edwards valve present in the aortic position. Procedure Date: 2017. Echo findings are consistent with stenosis of the aortic prosthesis. Aortic  valve area, by VTI measures 0.61 cm. Aortic valve mean gradient measures 49.0 mmHg. Aortic valve  Vmax measures 4.72 m/s. Aortic valve acceleration time measures 169 msec.  8. Aortic dilatation noted. There is borderline dilatation of the aortic root, measuring 37 mm.  9. The inferior vena cava is normal in size with greater than 50% respiratory variability, suggesting right atrial pressure of 3 mmHg. 10. Cannot exclude a small PFO. Comparison(s): A prior study was performed on 10/22/2023. The estimated ejection fraction was 45-50% with hypokinesis of inferior and posterior walls. There was grade II diastolic dysfunction with elevated LAP. There was moderate biatrial enlargement. 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 FINDINGS  Left Ventricle: Left ventricular ejection fraction, by estimation, is 20 to 25%. The left ventricle has severely decreased function. The left ventricle demonstrates regional wall motion abnormalities. The left ventricular internal cavity size was moderately to severely dilated. There is mild concentric left ventricular hypertrophy. Left ventricular diastolic parameters are indeterminate.  LV Wall Scoring: The entire inferior wall and posterior wall are hypokinetic. Right Ventricle: The right ventricular size is normal. No increase in right ventricular wall thickness. Right ventricular systolic function is normal. Tricuspid regurgitation signal is inadequate for assessing PA pressure. Left Atrium: Left atrial size was severely dilated. Right Atrium: Right atrial size was mild to moderately dilated. Pericardium: There is no evidence of pericardial effusion. Mitral Valve: The mitral valve is degenerative in appearance. Mild mitral annular calcification. Moderate to severe mitral valve regurgitation. No evidence of mitral valve stenosis. Tricuspid Valve: The tricuspid valve is normal in structure. Tricuspid valve regurgitation is trivial. No evidence of tricuspid stenosis. Aortic Valve: The aortic valve was not well visualized.  Aortic valve regurgitation is mild to moderate. Aortic valve mean gradient measures 49.0 mmHg. Aortic valve peak gradient measures 88.9 mmHg. Aortic valve area, by VTI measures 0.61 cm. There is a 25 mm Edwards valve present in the aortic position. Procedure Date: 2017. Pulmonic Valve: The pulmonic valve was normal in structure. Pulmonic valve regurgitation is trivial. No evidence of pulmonic stenosis. Aorta: Aortic dilatation noted. There is borderline dilatation of the aortic root, measuring 37 mm. Venous: The inferior vena cava is normal in size with greater than 50% respiratory variability, suggesting right atrial pressure of 3 mmHg. IAS/Shunts: Cannot exclude a small PFO. 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 Details below.  LEFT VENTRICLE PLAX 2D LVIDd:         6.60 cm   Diastology LVIDs:  5.30 cm   LV e' medial:    4.03 cm/s LV PW:         1.10 cm   LV E/e' medial:  30.5 LV IVS:        1.10 cm   LV e' lateral:   9.14 cm/s LVOT diam:     2.30 cm   LV E/e' lateral: 13.5 LV SV:         68 LV SV Index:   35 LVOT Area:     4.15 cm  RIGHT VENTRICLE             IVC RV S prime:     11.70 cm/s  IVC diam: 1.90 cm TAPSE (M-mode): 2.2 cm LEFT ATRIUM              Index        RIGHT ATRIUM           Index LA diam:        4.10 cm  2.11 cm/m   RA Area:     21.40 cm LA Vol (A2C):   104.0 ml 53.52 ml/m  RA Volume:   62.70 ml  32.27 ml/m LA Vol (A4C):   106.0 ml 54.55 ml/m LA Biplane Vol: 106.0 ml 54.55 ml/m  AORTIC VALVE AV Area (Vmax):    0.60 cm AV Area (Vmean):   0.60 cm AV Area (VTI):     0.61 cm AV Vmax:           471.50 cm/s AV Vmean:          323.000 cm/s AV VTI:            1.115 m AV Peak Grad:      88.9 mmHg AV Mean Grad:      49.0 mmHg LVOT Vmax:         68.00 cm/s LVOT Vmean:        46.900 cm/s LVOT VTI:          0.164 m LVOT/AV VTI ratio: 0.15  AORTA Ao Root diam: 3.70 cm Ao Asc diam:  3.50 cm MITRAL VALVE MV Area (PHT): 5.02 cm     SHUNTS MV Decel  Time: 151 msec     Systemic VTI:  0.16 m MV E velocity: 123.00 cm/s  Systemic Diam: 2.30 cm Emeline Calender Electronically signed by Emeline Calender Signature Date/Time: 04/19/2024/7:22:45 PM    Final      Procedure Type: Isolated AVR Perioperative OutcomeEstimate % Operative Mortality8.16% Morbidity & Mortality37% Stroke2.79% Renal Failure3.94% Reoperation10.6% Prolonged Ventilation27.7% Deep Sternal Wound Infection0.161% [place] [facility] Stay [facility] Stay (<6 days)*8.55%   _____________________     [place] Cardiomyopathy Questionnaire      04/20/2024    1:17 PM  K[facility]Q-12  1 a. Ability to shower/bathe Extremely limited  1 b. Ability to walk 1 block Extremely limited  1 c. Ability to hurry/jog Other, Did not do  2. Edema feet/ankles/legs 1-2 times a week  3. Limited by fatigue All of the time  5. Sitting up / on 3+ pillows Every night  6. Limited enjoyment of life Extremely limited  7. Rest of life w/ symptoms Not at all satisfied  8 a. Participation in hobbies Severely limited  8 b. Participation in chores Severely limited  8 c. Visiting family/friends Severely limited      _________________________   Pre Surgical Assessment: 5 M Walk Test   47M=16.43ft   5 Meter Walk Test- trial 1: 7.0 seconds 5  Meter Walk Test- trial 2: 7.1 seconds 5 Meter Walk Test- trial 3: 7.0 seconds 5 Meter Walk Test Average: 7.0 seconds   Assessment and Plan:   LUCCA BALLO is a 68 y.o. 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     Echo 04/19/24 showed EF 20-25%, WMAs, mod-severe LV dilation, mild LVH, severe LAE, mod-severe MR, and severe bioprosthetic valve failure with severe AS: mean gradient 49 mm hg, Vmax 4.72 m/s, AVA 0.61cm2 and moderate to severe AI.    [facility] 04/20/24 showed  95% stenosis in a large PLV vessel (the RCA has a relatively early bifurcation), low RA pressure but elevated PCWP, mild pulmonary venous hypertension.  Cardiac output noted to be adequate but on NE/DA.   I have reviewed the natural history of aortic stenosis with the patient. We have discussed the limitations of medical therapy and the poor prognosis associated with symptomatic aortic stenosis. We have reviewed potential treatment options, including palliative medical therapy, conventional surgical aortic valve replacement, and transcatheter aortic valve replacement. We discussed treatment options in the context of this patient's specific comorbid medical conditions.    The patient's predicted risk of mortality with conventional aortic valve replacement is 8.16% primarily based on age, previous AVR with sternotomy, PAD, acute CHF with shock, severe AI, COPD with tobacco abuse, MR and age and probable lung cancer.   Cardiac gated CTA of the heart 04/21/24 revealed anatomical characteristics consistent with bioprosthetic aortic stenosis suitable for treatment by VIV TAVR without any significant complicating features. CTA of the aorta and iliac vessels demonstrated severe peripheral vascular disease but after careful review of CT scans, R TF +/- Shockwave looks doable.   Additionally, a spiculated left apical pulmonary nodule and prostatomegaly were noted on CT scans. PSA normal. MRI brain without any evidence of metastasis. Outpatient PET scan recommended. Oncology reviewed scans and felt like pt would have >1 year to live.   The patient is posted for VIV TAVR - R TF +/- Shockwave lithotripsy +/- valve fracture next Tuesday 10/28 at 3:30pm. He is not a bailout candidate.      Signed, [name], PA-C  04/23/2024 12:24 PM  Patient seen and evaluated with Izetta Hummer, PA-C.  He continues to feel better since admission.  Brain MRI negative for mets, oncology says patient has > 1 year  survival, although he is not sure if he will actually seek out treatment if he does have lung cancer.  He has bioprosthetic valve degeneration with severe AS/AI and decreased EF.  Plan for right transfemoral 26 mm S3 on Tuesday with possible valve fracture.     STS score: 8.16%.  NYHA Class III.  The risks and benefits of transfemoral TAVR were discussed in detail.  The risks included death, stroke, paravalvular leak, aortic dissection, annulus rupture, device embolization, acute myocardial infarction, arrhythmia, heart block or need for permanent pacemaker.  We also discussed possibility of an emergent sternotomy to address any procedural complications.  Based on our discussion, we collectively decided that an emergent sternotomy would not be indicated.  The patient is agreeable to proceed.  Based on my review of his LHC, echo, and CTA, I agree with the multidisciplinary plan to proceed with a right TF TAVR with possible valve fracture.

## 2024-04-22 NOTE — Progress Notes (Signed)
 PHARMACY - ANTICOAGULATION CONSULT NOTE  Pharmacy Consult for heparin  infusion Indication: atrial fibrillation  Allergies  Allergen Reactions   No Known Allergies    Patient Measurements: Height: 5' 10 (177.8 cm) Weight: 73.8 kg (162 lb 11.2 oz) IBW/kg (Calculated) : 73 HEPARIN  DW (KG): 76.7  Vital Signs: Temp: 98.2 F (36.8 C) (10/23 2200) Temp Source: Core (10/23 2000) BP: 92/68 (10/23 2200) Pulse Rate: 74 (10/23 2200)  Labs: Recent Labs    04/20/24 0449 04/20/24 1406 04/21/24 0502 04/21/24 0646 04/21/24 1700 04/22/24 0452 04/22/24 1116 04/22/24 2247  HGB 14.8 15.3  16.0 14.4  --   --  13.9  --   --   HCT 44.7 45.0  47.0 42.5  --   --  40.9  --   --   PLT 140*  --  123*  --   --  106*  --   --   APTT  --   --   --  42*   < > 61* 61* 69*  HEPARINUNFRC  --   --   --  0.80*  --  0.29* 0.31  --   CREATININE 0.78  --  0.68  --   --  0.76  --   --    < > = values in this interval not displayed.   Estimated Creatinine Clearance: 88.7 mL/min (by C-G formula based on SCr of 0.76 mg/dL).  Assessment: Patient is a 68 YO male presenting to the ED with shortness of breath, increased inhaler use needs, and fatigue. Troponins 218 > 259. Cardiology consulted for evaluation of CHF exacerbation and elevated troponins. PMH significant for atrial fibrillation on Xarelto  PTA, and bioprosthetic AVR. Pharmacy consulted to dose heparin  for atrial fibrillation.   Last dose of Xarelto  on 10/19 at 8pm.  aPTT 69s - therapeutic with heparin  running at 1600 units/hr. Per RN, no signs of bleeding.    Goal of Therapy:  Heparin  level 0.3-0.7 units/ml aPTT 66-102s Monitor platelets by anticoagulation protocol: Yes   Plan:  Continue heparin  infusion at 1600 units/hr Check confirmatory aPTT Monitor aPTT, heparin  level, CBC daily until correlation.  F/u transition to DOAC when no further procedures required  Thank you for allowing pharmacy to be a part of this patient's care.   Lynwood Poplar, PharmD, BCPS Clinical Pharmacist 04/22/2024 11:46 PM

## 2024-04-22 NOTE — Plan of Care (Signed)
  Problem: Education: Goal: Understanding of cardiac disease, CV risk reduction, and recovery process will improve Outcome: Progressing   Problem: Clinical Measurements: Goal: Will remain free from infection Outcome: Progressing   Problem: Elimination: Goal: Will not experience complications related to urinary retention Outcome: Progressing   Problem: Safety: Goal: Ability to remain free from injury will improve Outcome: Progressing   Problem: Pain Managment: Goal: General experience of comfort will improve and/or be controlled Outcome: Progressing   Problem: Education: Goal: Understanding of CV disease, CV risk reduction, and recovery process will improve Outcome: Progressing   Problem: Cardiovascular: Goal: Vascular access site(s) Level 0-1 will be maintained Outcome: Progressing

## 2024-04-22 NOTE — Progress Notes (Signed)
 PHARMACY - ANTICOAGULATION CONSULT NOTE  Pharmacy Consult for heparin  Indication: atrial fibrillation  Labs: Recent Labs    04/19/24 1133 04/19/24 1354 04/19/24 1811 04/20/24 0449 04/20/24 1406 04/21/24 0502 04/21/24 0646 04/21/24 1700 04/22/24 0452  HGB 14.9  --   --  14.8 15.3  16.0 14.4  --   --  13.9  HCT 44.1  --   --  44.7 45.0  47.0 42.5  --   --  40.9  PLT 133*  --   --  140*  --  123*  --   --  106*  APTT  --   --   --   --   --   --  42* 40* 61*  HEPARINUNFRC  --   --   --   --   --   --  0.80*  --  0.29*  CREATININE 0.77  --   --  0.78  --  0.68  --   --   --   TROPONINIHS 218* 259* 268*  --   --   --   --   --   --    Assessment: 68yo male subtherapeutic on heparin  after rate change; no infusion issues or signs of bleeding per RN.  Goal of Therapy:  aPTT 66-102 seconds   Plan:  Increase heparin  infusion by 2 units/kg/hr to 1500 units/hr. Check labs in 6 hours.   Marvetta Dauphin, PharmD, BCPS 04/22/2024 5:29 AM

## 2024-04-23 ENCOUNTER — Encounter (HOSPITAL_COMMUNITY): Payer: Self-pay | Admitting: Internal Medicine

## 2024-04-23 DIAGNOSIS — J449 Chronic obstructive pulmonary disease, unspecified: Secondary | ICD-10-CM

## 2024-04-23 DIAGNOSIS — I509 Heart failure, unspecified: Secondary | ICD-10-CM

## 2024-04-23 DIAGNOSIS — I5020 Unspecified systolic (congestive) heart failure: Secondary | ICD-10-CM | POA: Diagnosis not present

## 2024-04-23 DIAGNOSIS — I272 Pulmonary hypertension, unspecified: Secondary | ICD-10-CM

## 2024-04-23 DIAGNOSIS — I739 Peripheral vascular disease, unspecified: Secondary | ICD-10-CM

## 2024-04-23 DIAGNOSIS — T82897A Other specified complication of cardiac prosthetic devices, implants and grafts, initial encounter: Secondary | ICD-10-CM

## 2024-04-23 DIAGNOSIS — I080 Rheumatic disorders of both mitral and aortic valves: Secondary | ICD-10-CM

## 2024-04-23 DIAGNOSIS — I2510 Atherosclerotic heart disease of native coronary artery without angina pectoris: Secondary | ICD-10-CM

## 2024-04-23 DIAGNOSIS — R570 Cardiogenic shock: Secondary | ICD-10-CM

## 2024-04-23 DIAGNOSIS — F17210 Nicotine dependence, cigarettes, uncomplicated: Secondary | ICD-10-CM

## 2024-04-23 LAB — CBC
HCT: 42.6 % (ref 39.0–52.0)
Hemoglobin: 14.4 g/dL (ref 13.0–17.0)
MCH: 33.6 pg (ref 26.0–34.0)
MCHC: 33.8 g/dL (ref 30.0–36.0)
MCV: 99.3 fL (ref 80.0–100.0)
Platelets: 111 K/uL — ABNORMAL LOW (ref 150–400)
RBC: 4.29 MIL/uL (ref 4.22–5.81)
RDW: 13.8 % (ref 11.5–15.5)
WBC: 5.7 K/uL (ref 4.0–10.5)
nRBC: 0 % (ref 0.0–0.2)

## 2024-04-23 LAB — COMPREHENSIVE METABOLIC PANEL WITH GFR
ALT: 18 U/L (ref 0–44)
AST: 20 U/L (ref 15–41)
Albumin: 3.2 g/dL — ABNORMAL LOW (ref 3.5–5.0)
Alkaline Phosphatase: 36 U/L — ABNORMAL LOW (ref 38–126)
Anion gap: 11 (ref 5–15)
BUN: 21 mg/dL (ref 8–23)
CO2: 26 mmol/L (ref 22–32)
Calcium: 8.8 mg/dL — ABNORMAL LOW (ref 8.9–10.3)
Chloride: 97 mmol/L — ABNORMAL LOW (ref 98–111)
Creatinine, Ser: 0.71 mg/dL (ref 0.61–1.24)
GFR, Estimated: >60 mL/min (ref >60)
Glucose, Bld: 118 mg/dL — ABNORMAL HIGH (ref 70–99)
Potassium: 4 mmol/L (ref 3.5–5.1)
Sodium: 134 mmol/L — ABNORMAL LOW (ref 135–145)
Total Bilirubin: 0.9 mg/dL (ref 0.0–1.2)
Total Protein: 6.1 g/dL — ABNORMAL LOW (ref 6.5–8.1)

## 2024-04-23 LAB — MAGNESIUM: Magnesium: 1.8 mg/dL (ref 1.7–2.4)

## 2024-04-23 LAB — APTT: aPTT: 78 s — ABNORMAL HIGH (ref 24–36)

## 2024-04-23 LAB — COOXEMETRY PANEL
Carboxyhemoglobin: 1.7 % — ABNORMAL HIGH (ref 0.5–1.5)
Methemoglobin: <0.7 % (ref 0.0–1.5)
O2 Saturation: 76 %
Total hemoglobin: 14.6 g/dL (ref 12.0–16.0)

## 2024-04-23 LAB — GLUCOSE, CAPILLARY
Glucose-Capillary: 133 mg/dL — ABNORMAL HIGH (ref 70–99)
Glucose-Capillary: 117 mg/dL — ABNORMAL HIGH (ref 70–99)
Glucose-Capillary: 117 mg/dL — ABNORMAL HIGH (ref 70–99)

## 2024-04-23 LAB — HEPARIN LEVEL (UNFRACTIONATED): Heparin Unfractionated: 0.41 [IU]/mL (ref 0.30–0.70)

## 2024-04-23 MED ORDER — MAGNESIUM SULFATE 2 GM/50ML IV SOLN
2.0000 g | Freq: Once | INTRAVENOUS | Status: AC
  Administered 2024-04-23: 2 g via INTRAVENOUS
  Filled 2024-04-23: qty 50

## 2024-04-23 MED ORDER — PNEUMOCOCCAL 20-VAL CONJ VACC 0.5 ML IM SUSY
0.5000 mL | PREFILLED_SYRINGE | Freq: Once | INTRAMUSCULAR | Status: AC
Start: 2024-04-23 — End: 2024-04-23
  Administered 2024-04-23: 0.5 mL via INTRAMUSCULAR
  Filled 2024-04-23: qty 0.5

## 2024-04-23 MED ORDER — INFLUENZA VAC SPLIT HIGH-DOSE 0.5 ML IM SUSY
0.5000 mL | PREFILLED_SYRINGE | Freq: Once | INTRAMUSCULAR | Status: AC
  Administered 2024-04-23: 0.5 mL via INTRAMUSCULAR
  Filled 2024-04-23: qty 0.5

## 2024-04-23 NOTE — Progress Notes (Addendum)
 Advanced Heart Failure Rounding Note  Cardiologist: Vinie JAYSON Maxcy, MD  AHF: Dr. Cherrie  Chief Complaint: Cardiogenic Shock D/T Severe AoV Stenosis  Patient Profile   68 y/o male with h/o bioprosthetic AVR in 2017, PAF, PAD, COPD and tobacco use, admitted w/ CS in the setting of severe prosthetic aortic valve stenosis.   Admit Echo AoV mean gradient 49 mmHg. LVEF 20-25% (down from 40-45%), RV ok.   Subjective:    R/LHC 10/2 (dobutamine 2.5 + NE 10): RA 4, PA 49/22 (32), PCW 23, CO/CI (TD) 4.47/2.67, PVR 2 WU, PAPi 6.75   RPAV lesion is 95% stenosed.  On DBA 2.5 + NE 7. BP stable overnight UOP 3L (Net -2.3L).   Co-ox 76% PAP: (33-78)/(10-37) 53/23 CVP:  [0 mmHg-20 mmHg] 2 mmHg CO:  [4 L/min-4.3 L/min] 4.3 L/min CI:  [2.1 L/min/m2-2.3 L/min/m2] 2.3 L/min/m2  Sitting up in chair. No SOB, CP. No dizziness.  Objective:    Weight Range: 73.3 kg Body mass index is 23.19 kg/m.   Vital Signs:   Temp:  [96.8 F (36 C)-99.3 F (37.4 C)] 98.4 F (36.9 C) (10/24 1000) Pulse Rate:  [67-93] 81 (10/24 1000) Resp:  [14-32] 32 (10/24 1000) BP: (86-117)/(58-102) 94/66 (10/24 1000) SpO2:  [87 %-97 %] 93 % (10/24 1000) Weight:  [73.3 kg] 73.3 kg (10/24 0630) Last BM Date : 04/22/24  Weight change: Filed Weights   04/19/24 1120 04/19/24 2026 04/23/24 0630  Weight: 76.7 kg 73.8 kg 73.3 kg   Intake/Output:  Intake/Output Summary (Last 24 hours) at 04/23/2024 1022 Last data filed at 04/23/2024 1000 Gross per 24 hour  Intake 1018.92 ml  Output 2975 ml  Net -1956.08 ml    Physical Exam    General: Well appearing. No distress on [place] Cardiac: JVP flat. S1 and S2 present. No murmurs. Resp: Lung sounds clear and equal B/L Extremities: Warm and dry.  No peripheral edema.  Neuro: Alert and oriented x3. Affect pleasant.   Telemetry   SR 80s occasional PVCs (personally reviewed)  Labs    CBC Recent Labs    04/22/24 0452 04/23/24 0313  WBC 6.2 5.7  HGB 13.9 14.4   HCT 40.9 42.6  MCV 99.3 99.3  PLT 106* 111*   Basic Metabolic Panel Recent Labs    89/77/74 0502 04/22/24 0452 04/23/24 0313  NA 133* 134* 134*  K 3.8 3.8 4.0  CL 103 100 97*  CO2 22 25 26   GLUCOSE 178* 155* 118*  BUN 21 20 21   CREATININE 0.68 0.76 0.71  CALCIUM  8.3* 8.3* 8.8*  MG 1.9  --  1.8   Liver Function Tests Recent Labs    04/22/24 0452 04/23/24 0313  AST 20 20  ALT 17 18  ALKPHOS 34* 36*  BILITOT 0.9 0.9  PROT 6.0* 6.1*  ALBUMIN  3.1* 3.2*   BNP (last 3 results) Recent Labs    03/25/24 1359 04/19/24 1133  BNP 1,332.6* 1,400.2*   Hemoglobin A1C Recent Labs    04/22/24 0452  HGBA1C 5.2   Medications:    Scheduled Medications:  aspirin  EC  81 mg Oral Daily   atorvastatin   10 mg Oral Daily   Chlorhexidine  Gluconate Cloth  6 each Topical Daily   finasteride   5 mg Oral QPM   polyethylene glycol  17 g Oral BID   senna  1 tablet Oral BID   sodium chloride  flush  10-40 mL Intracatheter Q12H   sodium chloride  flush  3 mL Intravenous Q12H  sodium chloride  flush  3 mL Intravenous Q12H   tamsulosin   0.4 mg Oral QPC supper   traZODone  50 mg Oral QHS    Infusions:  DOBUTamine 2.5 mcg/kg/min (04/23/24 1000)   heparin  1,600 Units/hr (04/23/24 1000)   norepinephrine (LEVOPHED) Adult infusion 7 mcg/min (04/23/24 1000)    PRN Medications: acetaminophen , ondansetron  (ZOFRAN ) IV, mouth rinse, sodium chloride  flush, sodium chloride  flush, sodium chloride  flush  Assessment/Plan   1. Acute on chronic systolic CHF/cardiogenic shock: Echo this admission with EF 20-25%, moderate LV dilation,  RV normal, moderate-severe MR, bioprosthetic aortic valve with severe stenosis and mean gradient 49 with mild-moderate AI, IVC not dilated.  Prior echo had shown EF 45-50%.  In the past, EF dropped with severe AS.  Suspect this is the cause again.  LHC in 2017 showed nonobstructive CAD, LHC this admission showed 95% stenosis large RPLV branch.  On RHC, RA pressure was low  but PCWP elevated. This morning remains on dobutamine 2.5 and NE 7. Co-ox 76%, FICK CI 2.4   - Continue current dobutamine 2.5, wean NE as BP allows.  - Hold diuresis today - no GDMT currently given low BP requiring NE  - Discussing valve replacement options w/ structural team   2. Aortic stenosis: Severe bioprosthetic AS with mild-moderate AI. He initially had AVR for bicuspid aortic valve. Structural heart team has seen for evaluation for valve-in-valve TAVR. Unfortunately, pre-TAVR peripheral vascular scans show severe peripheral artery disease that will likely preclude TF access  - plan for VIV TAVR on Tues 10/28, - R TF +/- Shockwave lithotripsy, not a bailout candidate  3. Spiculated Left Apical Pulmonary Nodule/ Prostatomegaly - incidental findings on TAVR protocol CTA - plan evaluation by PET-CT as outpatient  - F/u PSA reassuring, normal at 1.9  mg/ml, can follow w/ outpatient urology   4. CAD: No chest pain, minimally elevated troponin with no trend.  Suspect demand ischemia from shock/CHF.  LHC shows 95% stenosis in large RPLV branch.  For the time being, will manage medically.  - ASA 81 mg daily - Continue statin.   5. Atrial fibrillation: Paroxysmal.  He is in NSR here.  - Continue heparin  gtt.   6 PAD: History of peripheral intervention in 2018.  Will need pre-TAVR peripheral vascular evaluation.   7. Smoking/suspected COPD: Needs to quit.    CRITICAL CARE Performed by: [place] Lee  Total critical care time: 15 minutes  -Critical care time was exclusive of separately billable procedures and treating other patients. -Critical care was necessary to treat or prevent imminent or life-threatening deterioration. -Critical care was time spent personally by me on the following activities: development of treatment plan with patient and/or surrogate as well as nursing, discussions with consultants, evaluation of patient's response to treatment, examination of patient, obtaining  history from patient or surrogate, ordering and performing treatments and interventions, ordering and review of laboratory studies, ordering and review of radiographic studies, pulse oximetry and re-evaluation of patient's condition.  Length of Stay: 4  [place] Lee, NP  04/23/2024, 10:22 AM  Advanced Heart Failure Team Pager [phone] (M-F; 7a - 5p)  Please contact [facility] for night-coverage after hours (5p -7a ) and weekends on [email or web address]  Agree with above  Remains on DBA 2.5 and NE 7.    [place]wan #s stable. Volume status low (holding diuretics)  No CP or SOB   General:  Sitting up in chair No resp difficulty HEENT: normal Neck: supple. RIJ swan  Carotids 2+ bilat; no  bruits. No lymphadenopathy or thryomegaly appreciated. Cor: PMI nondisplaced. Regular rate & rhythm. 3/6 AS Lungs: clear decreased throguhout Abdomen: soft, nontender, nondistended. No hepatosplenomegaly. No bruits or masses. Good bowel sounds. Extremities: no cyanosis, clubbing, rash, edema Neuro: alert & orientedx3, cranial nerves grossly intact. moves all 4 extremities w/o difficulty. Affect pleasant  Continue NE/DBA. Plan TAVR Tuesday. D/w Structural team.   Access strategy still being discussed. Industry recommending subclavian. TCTS/Structural discussing.   Continue to hold diuretics today.   Outpatient w/u of lung CA.   CRITICAL CARE Performed by: Cherrie Sieving  Total critical care time: 40 minutes  Critical care time was exclusive of separately billable procedures and treating other patients.  Critical care was necessary to treat or prevent imminent or life-threatening deterioration.  Critical care was time spent personally by me (independent of midlevel providers or residents) on the following activities: development of treatment plan with patient and/or surrogate as well as nursing, discussions with consultants, evaluation of patient's response to treatment, examination of patient, obtaining  history from patient or surrogate, ordering and performing treatments and interventions, ordering and review of laboratory studies, ordering and review of radiographic studies, pulse oximetry and re-evaluation of patient's condition.  Sieving Cherrie, MD  2:40 PM

## 2024-04-23 NOTE — Plan of Care (Signed)
  Problem: Education: Goal: Understanding of cardiac disease, CV risk reduction, and recovery process will improve Outcome: Progressing Goal: Individualized Educational Video(s) Outcome: Progressing   Problem: Activity: Goal: Ability to tolerate increased activity will improve Outcome: Progressing   Problem: Education: Goal: Knowledge of General Education information will improve Description: Including pain rating scale, medication(s)/side effects and non-pharmacologic comfort measures Outcome: Progressing   Problem: Nutrition: Goal: Adequate nutrition will be maintained Outcome: Progressing   Problem: Coping: Goal: Level of anxiety will decrease Outcome: Progressing   Problem: Elimination: Goal: Will not experience complications related to bowel motility Outcome: Progressing Goal: Will not experience complications related to urinary retention Outcome: Progressing   Problem: Pain Managment: Goal: General experience of comfort will improve and/or be controlled Outcome: Progressing

## 2024-04-23 NOTE — Progress Notes (Signed)
 PHARMACY - ANTICOAGULATION CONSULT NOTE  Pharmacy Consult for heparin  infusion Indication: atrial fibrillation  Allergies  Allergen Reactions   No Known Allergies    Patient Measurements: Height: 5' 10 (177.8 cm) Weight: 73.3 kg (161 lb 9.6 oz) IBW/kg (Calculated) : 73 HEPARIN  DW (KG): 76.7  Vital Signs: Temp: 97.5 F (36.4 C) (10/24 0700) Temp Source: Core (10/24 0400) BP: 107/75 (10/24 0700) Pulse Rate: 83 (10/24 0700)  Labs: Recent Labs    04/21/24 0502 04/21/24 0646 04/22/24 0452 04/22/24 1116 04/22/24 2247 04/23/24 0313  HGB 14.4  --  13.9  --   --  14.4  HCT 42.5  --  40.9  --   --  42.6  PLT 123*  --  106*  --   --  111*  APTT  --    < > 61* 61* 69* 78*  HEPARINUNFRC  --    < > 0.29* 0.31  --  0.41  CREATININE 0.68  --  0.76  --   --  0.71   < > = values in this interval not displayed.   Estimated Creatinine Clearance: 88.7 mL/min (by C-G formula based on SCr of 0.71 mg/dL).  Medical History: Past Medical History:  Diagnosis Date   Asthma    CAD (coronary artery disease) 01/23/2016   minor CAD at cath-40% RCA   CHF (congestive heart failure) (HCC)    aortic valve replacement   COPD (chronic obstructive pulmonary disease) (HCC)    denies patient said that a pulmonologist said he said that he didnt have COPD   Dyspnea    Dysrhythmia    hx afib   Heart murmur    had a murmur before the aortic valve replacement, no murmur according to dr. Mona per patient   Hypertension    Non-ischemic cardiomyopathy (HCC) 01/23/2016   EF 30-35%   Pneumonia    Severe aortic stenosis    SOBOE (shortness of breath on exertion)     Assessment: Patient is a 68 YO male presenting to the ED with shortness of breath, increased inhaler use needs, and fatigue. Troponins 218 > 259. Cardiology consulted for evaluation of CHF exacerbation and elevated troponins. PMH significant for atrial fibrillation on Xarelto  PTA, and bioprosthetic AVR. Pharmacy consulted to dose heparin   for atrial fibrillation.   Last dose of Xarelto  on 10/19 at 8pm.  aPTT 78s is therapeutic with heparin  running at 1600 units/hr. Heparin  level 0.41 is now correlating. Hgb (14.4) and PLTs (111) are stable. Per RN, no report of pauses, issues with the line, or signs of bleeding.    Goal of Therapy:  Monitor platelets by anticoagulation protocol: Yes   Plan:  Increase heparin  infusion to 1600 units/hr Monitor daily heparin  level, CBC, signs and symptoms of bleeding F/u transition to DOAC when no further procedures required   Thank you for allowing pharmacy to be a part of this patient's care.   Nidia Schaffer, PharmD PGY2 Cardiology Pharmacy Resident  Please check AMION for all [facility] Pharmacy phone numbers After 10:00 PM, call Main Pharmacy [phone]  04/23/2024 8:47 AM

## 2024-04-23 NOTE — TOC Progression Note (Signed)
 Transition of Care [facility]) - Progression Note    Patient Details  Name: [name] MRN: [id number] Date of Birth: [birth year]  [facility] CM/SW Contact  Justina Delcia Czar, RN Phone Number:[phone] 04/23/2024, 10:11 AM  Clinical Narrative:     Inpatient CM contacted pt's dtr, Trista. She will be available to assist after dc. Explained pt may need IP rehab or SNF rehab. Will need PT/OT evaluation and recommendations. Verbalized understanding.  Patient remains on heparin , dobutamine and levophed gtt. Chart reviewed for discharge readiness, patient not medically stable for d/c. Inpatient CM/CSW will continue to monitor pt's advancement through interdisciplinary progression rounds.   If new pt transition needs arise, MD please place a TOC consult.    Expected Discharge Plan: Home w Home Health Services Barriers to Discharge: Continued Medical Work up    Expected Discharge Plan and Services   Discharge Planning Services: CM Consult   Living arrangements for the past 2 months: Single Family Home                      Social Drivers of Health (SDOH) Interventions SDOH Screenings   Food Insecurity: No Food Insecurity (04/19/2024)  Housing: Low Risk  (04/19/2024)  Transportation Needs: No Transportation Needs (04/19/2024)  Utilities: Not At Risk (04/19/2024)  Social Connections: Socially Isolated (04/19/2024)  Tobacco Use: Medium Risk (04/20/2024)    Readmission Risk Interventions     No data to display

## 2024-04-24 DIAGNOSIS — R570 Cardiogenic shock: Secondary | ICD-10-CM | POA: Diagnosis not present

## 2024-04-24 DIAGNOSIS — I5020 Unspecified systolic (congestive) heart failure: Secondary | ICD-10-CM | POA: Diagnosis not present

## 2024-04-24 LAB — CBC
HCT: 42.6 % (ref 39.0–52.0)
Hemoglobin: 14.4 g/dL (ref 13.0–17.0)
MCH: 33.6 pg (ref 26.0–34.0)
MCHC: 33.8 g/dL (ref 30.0–36.0)
MCV: 99.5 fL (ref 80.0–100.0)
Platelets: 110 K/uL — ABNORMAL LOW (ref 150–400)
RBC: 4.28 MIL/uL (ref 4.22–5.81)
RDW: 13.8 % (ref 11.5–15.5)
WBC: 6.8 K/uL (ref 4.0–10.5)
nRBC: 0 % (ref 0.0–0.2)

## 2024-04-24 LAB — COMPREHENSIVE METABOLIC PANEL WITH GFR
ALT: 19 U/L (ref 0–44)
AST: 23 U/L (ref 15–41)
Albumin: 3.2 g/dL — ABNORMAL LOW (ref 3.5–5.0)
Alkaline Phosphatase: 33 U/L — ABNORMAL LOW (ref 38–126)
Anion gap: 9 (ref 5–15)
BUN: 17 mg/dL (ref 8–23)
CO2: 25 mmol/L (ref 22–32)
Calcium: 8.9 mg/dL (ref 8.9–10.3)
Chloride: 99 mmol/L (ref 98–111)
Creatinine, Ser: 0.78 mg/dL (ref 0.61–1.24)
GFR, Estimated: >60 mL/min (ref >60)
Glucose, Bld: 147 mg/dL — ABNORMAL HIGH (ref 70–99)
Potassium: 4.2 mmol/L (ref 3.5–5.1)
Sodium: 133 mmol/L — ABNORMAL LOW (ref 135–145)
Total Bilirubin: 0.9 mg/dL (ref 0.0–1.2)
Total Protein: 6.2 g/dL — ABNORMAL LOW (ref 6.5–8.1)

## 2024-04-24 LAB — COOXEMETRY PANEL
Carboxyhemoglobin: 1.8 % — ABNORMAL HIGH (ref 0.5–1.5)
Methemoglobin: <0.7 % (ref 0.0–1.5)
O2 Saturation: 75.9 %
Total hemoglobin: 14.9 g/dL (ref 12.0–16.0)

## 2024-04-24 LAB — HEPARIN LEVEL (UNFRACTIONATED): Heparin Unfractionated: 0.29 [IU]/mL — ABNORMAL LOW (ref 0.30–0.70)

## 2024-04-24 LAB — GLUCOSE, CAPILLARY
Glucose-Capillary: 145 mg/dL — ABNORMAL HIGH (ref 70–99)
Glucose-Capillary: 119 mg/dL — ABNORMAL HIGH (ref 70–99)
Glucose-Capillary: 111 mg/dL — ABNORMAL HIGH (ref 70–99)

## 2024-04-24 MED ORDER — SORBITOL 70 % SOLN
30.0000 mL | Freq: Once | Status: AC
  Administered 2024-04-24: 30 mL via ORAL
  Filled 2024-04-24: qty 30

## 2024-04-24 NOTE — Progress Notes (Signed)
 PHARMACY - ANTICOAGULATION CONSULT NOTE  Pharmacy Consult for heparin  infusion Indication: atrial fibrillation  Allergies  Allergen Reactions   No Known Allergies    Patient Measurements: Height: 5' 10 (177.8 cm) Weight: 73.3 kg (161 lb 9.6 oz) IBW/kg (Calculated) : 73 HEPARIN  DW (KG): 76.7  Vital Signs: Temp: 98.3 F (36.8 C) (10/25 0400) Temp Source: Oral (10/25 0400) BP: 102/70 (10/25 0600) Pulse Rate: 81 (10/25 0600)  Labs: Recent Labs    04/22/24 0452 04/22/24 1116 04/22/24 2247 04/23/24 0313 04/24/24 0500  HGB 13.9  --   --  14.4 14.4  HCT 40.9  --   --  42.6 42.6  PLT 106*  --   --  111* 110*  APTT 61* 61* 69* 78*  --   HEPARINUNFRC 0.29* 0.31  --  0.41 0.29*  CREATININE 0.76  --   --  0.71 0.78   Estimated Creatinine Clearance: 88.7 mL/min (by C-G formula based on SCr of 0.78 mg/dL).  Medical History: Past Medical History:  Diagnosis Date   Asthma    CAD (coronary artery disease) 01/23/2016   minor CAD at cath-40% RCA   CHF (congestive heart failure) (HCC)    aortic valve replacement   COPD (chronic obstructive pulmonary disease) (HCC)    denies patient said that a pulmonologist said he said that he didnt have COPD   Dyspnea    Dysrhythmia    hx afib   Heart murmur    had a murmur before the aortic valve replacement, no murmur according to dr. Mona per patient   Hypertension    Non-ischemic cardiomyopathy (HCC) 01/23/2016   EF 30-35%   Pneumonia    Severe aortic stenosis    SOBOE (shortness of breath on exertion)     Assessment: Patient is a 68 YO male presenting to the ED with shortness of breath, increased inhaler use needs, and fatigue. Troponins 218 > 259. Cardiology consulted for evaluation of CHF exacerbation and elevated troponins. PMH significant for atrial fibrillation on Xarelto  PTA, and bioprosthetic AVR. Pharmacy consulted to dose heparin  for atrial fibrillation.   Last dose of Xarelto  on 10/19 at 8pm.  04/24/24 AM: Heparin   level 0.29, slightly subtherapeutic on heparin  1600 units/hr. No issues with infusion running or signs of bleeding per RN. CBC stable (Hgb 14.4, PLT 13.8).   Goal of Therapy:  Monitor platelets by anticoagulation protocol: Yes   Plan:  Increase heparin  infusion to 1700 units/hr Monitor daily heparin  level, CBC, signs and symptoms of bleeding F/u transition to DOAC when no further procedures required  Thank you for allowing pharmacy to be a part of this patient's care.   Morna Breach, PharmD PGY2 Cardiology Pharmacy Resident 04/24/2024 8:18 AM

## 2024-04-24 NOTE — Plan of Care (Signed)
  Problem: Education: Goal: Understanding of cardiac disease, CV risk reduction, and recovery process will improve Outcome: Progressing Goal: Individualized Educational Video(s) Outcome: Progressing   Problem: Activity: Goal: Ability to tolerate increased activity will improve Outcome: Progressing   Problem: Cardiac: Goal: Ability to achieve and maintain adequate cardiovascular perfusion will improve Outcome: Progressing   Problem: Health Behavior/Discharge Planning: Goal: Ability to safely manage health-related needs after discharge will improve Outcome: Progressing   Problem: Education: Goal: Knowledge of General Education information will improve Description: Including pain rating scale, medication(s)/side effects and non-pharmacologic comfort measures Outcome: Progressing   Problem: Health Behavior/Discharge Planning: Goal: Ability to manage health-related needs will improve Outcome: Progressing

## 2024-04-24 NOTE — Progress Notes (Signed)
 Advanced Heart Failure Rounding Note  Cardiologist: Vinie JAYSON Maxcy, MD  AHF: Dr. Cherrie  Chief Complaint: Cardiogenic Shock D/T Severe AoV Stenosis  Patient Profile   68 y/o male with h/o bioprosthetic AVR in 2017, PAF, PAD, COPD and tobacco use, admitted w/ CS in the setting of severe prosthetic aortic valve stenosis.   Admit Echo AoV mean gradient 49 mmHg. LVEF 20-25% (down from 40-45%), RV ok.   Subjective:    Remains on NE/DBA  Feels good. CVP 5. Co-ox 81%   Denies CP or SOB.   Objective:    Weight Range: 73.3 kg Body mass index is 23.19 kg/m.   Vital Signs:   Temp:  [97.3 F (36.3 C)-98.8 F (37.1 C)] 98.8 F (37.1 C) (10/25 1100) Pulse Rate:  [79-101] 81 (10/25 0600) Resp:  [14-34] 17 (10/25 0600) BP: (89-110)/(58-90) 102/70 (10/25 0600) SpO2:  [89 %-95 %] 92 % (10/25 0600) Last BM Date : 04/22/24  Weight change: Filed Weights   04/19/24 1120 04/19/24 2026 04/23/24 0630  Weight: 76.7 kg 73.8 kg 73.3 kg   Intake/Output:  Intake/Output Summary (Last 24 hours) at 04/24/2024 1303 Last data filed at 04/24/2024 0900 Gross per 24 hour  Intake 429.57 ml  Output 1800 ml  Net -1370.43 ml    Physical Exam    General: Sitting in chair . No resp difficulty HEENT: normal Neck: supple. no JVD.  Cor: Regular rate & rhythm. 3/6 AS Lungs: clear decreased Abdomen: soft, nontender, nondistended.Good bowel sounds. Extremities: no cyanosis, clubbing, rash, edema Neuro: alert & orientedx3, cranial nerves grossly intact. moves all 4 extremities w/o difficulty. Affect pleasant   Telemetry   SR 70-80s occasional PVCs Personally reviewed  Labs    CBC Recent Labs    04/23/24 0313 04/24/24 0500  WBC 5.7 6.8  HGB 14.4 14.4  HCT 42.6 42.6  MCV 99.3 99.5  PLT 111* 110*   Basic Metabolic Panel Recent Labs    89/75/74 0313 04/24/24 0500  NA 134* 133*  K 4.0 4.2  CL 97* 99  CO2 26 25  GLUCOSE 118* 147*  BUN 21 17  CREATININE 0.71 0.78  CALCIUM   8.8* 8.9  MG 1.8  --    Liver Function Tests Recent Labs    04/23/24 0313 04/24/24 0500  AST 20 23  ALT 18 19  ALKPHOS 36* 33*  BILITOT 0.9 0.9  PROT 6.1* 6.2*  ALBUMIN  3.2* 3.2*   BNP (last 3 results) Recent Labs    03/25/24 1359 04/19/24 1133  BNP 1,332.6* 1,400.2*   Hemoglobin A1C Recent Labs    04/22/24 0452  HGBA1C 5.2   Medications:    Scheduled Medications:  aspirin  EC  81 mg Oral Daily   atorvastatin   10 mg Oral Daily   Chlorhexidine  Gluconate Cloth  6 each Topical Daily   finasteride   5 mg Oral QPM   polyethylene glycol  17 g Oral BID   senna  1 tablet Oral BID   sodium chloride  flush  10-40 mL Intracatheter Q12H   sodium chloride  flush  3 mL Intravenous Q12H   sodium chloride  flush  3 mL Intravenous Q12H   sorbitol  30 mL Oral Once   tamsulosin   0.4 mg Oral QPC supper   traZODone  50 mg Oral QHS    Infusions:  DOBUTamine 2.5 mcg/kg/min (04/24/24 0600)   heparin  1,700 Units/hr (04/24/24 0821)   norepinephrine (LEVOPHED) Adult infusion 7 mcg/min (04/24/24 0600)    PRN Medications: acetaminophen , ondansetron  (ZOFRAN )  IV, mouth rinse, sodium chloride  flush, sodium chloride  flush, sodium chloride  flush  Assessment/Plan   1. Acute on chronic systolic CHF/cardiogenic shock: Echo this admission with EF 20-25%, moderate LV dilation,  RV normal, moderate-severe MR, bioprosthetic aortic valve with severe stenosis and mean gradient 49 with mild-moderate AI, IVC not dilated.  Prior echo had shown EF 45-50%.  In the past, EF dropped with severe AS.  Suspect this is the cause again.  LHC in 2017 showed nonobstructive CAD, LHC this admission showed 95% stenosis large RPLV branch.  On RHC, RA pressure was low but PCWP elevated.  - Remains on dobutamine 2.5 and NE 7. Co-ox stable 81% (rising) SBP 100-105 - Will stop DBA. Continue NE - Volume low. Continue to hold lasix  - no GDMT currently given low BP requiring NE  - Has been seen by TCTS. Plan TAVR Tuesday  2.  Aortic stenosis: Severe bioprosthetic AS with mild-moderate AI. He initially had AVR for bicuspid aortic valve. Structural heart team has seen for evaluation for valve-in-valve TAVR. Unfortunately, pre-TAVR peripheral vascular scans show severe peripheral artery disease that will likely preclude TF access  - plan for VIV TAVR on Tues 10/28, - R TF +/- Shockwave lithotripsy, not a bailout candidate. D/w TCTS  3. Spiculated Left Apical Pulmonary Nodule/ Prostatomegaly - incidental findings on TAVR protocol CTA - plan evaluation by PET-CT as outpatient -> d/w Oncology - F/u PSA reassuring, normal at 1.9  mg/ml, can follow w/ outpatient urology   4. CAD: No chest pain, minimally elevated troponin with no trend.  Suspect demand ischemia from shock/CHF.  LHC shows 95% stenosis in large RPLV branch.  For the time being, will manage medically.  - ASA 81 mg daily - Continue statin.  - No s/s angina  5. Atrial fibrillation: Paroxysmal.  - Remains in NSR - Continue heparin  gtt.   6 PAD: History of peripheral intervention in 2018.    7. Smoking/suspected COPD: Needs to quit.    CRITICAL CARE Performed by: Toribio Fuel  Total critical care time: 36 minutes  -Critical care time was exclusive of separately billable procedures and treating other patients. -Critical care was necessary to treat or prevent imminent or life-threatening deterioration. -Critical care was time spent personally by me on the following activities: development of treatment plan with patient and/or surrogate as well as nursing, discussions with consultants, evaluation of patient's response to treatment, examination of patient, obtaining history from patient or surrogate, ordering and performing treatments and interventions, ordering and review of laboratory studies, ordering and review of radiographic studies, pulse oximetry and re-evaluation of patient's condition.  Length of Stay: 5  Toribio Fuel, MD  04/24/2024, 1:03  PM  Advanced Heart Failure Team Pager [phone] (M-F; 7a - 5p)  Please contact [facility] for night-coverage after hours (5p -7a ) and weekends on [email or web address]  Agree with above

## 2024-04-24 NOTE — Plan of Care (Signed)
  Problem: Clinical Measurements: Goal: Will remain free from infection Outcome: Progressing   Problem: Clinical Measurements: Goal: Cardiovascular complication will be avoided Outcome: Progressing   Problem: Activity: Goal: Risk for activity intolerance will decrease Outcome: Progressing   Problem: Pain Managment: Goal: General experience of comfort will improve and/or be controlled Outcome: Progressing   Problem: Skin Integrity: Goal: Risk for impaired skin integrity will decrease Outcome: Progressing

## 2024-04-25 ENCOUNTER — Inpatient Hospital Stay (HOSPITAL_COMMUNITY)

## 2024-04-25 DIAGNOSIS — I5020 Unspecified systolic (congestive) heart failure: Secondary | ICD-10-CM | POA: Diagnosis not present

## 2024-04-25 DIAGNOSIS — R570 Cardiogenic shock: Secondary | ICD-10-CM | POA: Diagnosis not present

## 2024-04-25 LAB — CBC
HCT: 41.4 % (ref 39.0–52.0)
Hemoglobin: 13.8 g/dL (ref 13.0–17.0)
MCH: 32.9 pg (ref 26.0–34.0)
MCHC: 33.3 g/dL (ref 30.0–36.0)
MCV: 98.8 fL (ref 80.0–100.0)
Platelets: 121 K/uL — ABNORMAL LOW (ref 150–400)
RBC: 4.19 MIL/uL — ABNORMAL LOW (ref 4.22–5.81)
RDW: 13.8 % (ref 11.5–15.5)
WBC: 5.3 K/uL (ref 4.0–10.5)
nRBC: 0 % (ref 0.0–0.2)

## 2024-04-25 LAB — MAGNESIUM
Magnesium: 1.9 mg/dL (ref 1.7–2.4)
Magnesium: >9 mg/dL (ref 1.7–2.4)
Magnesium: 1.9 mg/dL (ref 1.7–2.4)

## 2024-04-25 LAB — CG4 I-STAT (LACTIC ACID): Lactic Acid, Venous: 1 mmol/L (ref 0.5–1.9)

## 2024-04-25 LAB — COMPREHENSIVE METABOLIC PANEL WITH GFR
ALT: 46 U/L — ABNORMAL HIGH (ref 0–44)
AST: 47 U/L — ABNORMAL HIGH (ref 15–41)
Albumin: 3.2 g/dL — ABNORMAL LOW (ref 3.5–5.0)
Alkaline Phosphatase: 47 U/L (ref 38–126)
Anion gap: 11 (ref 5–15)
BUN: 19 mg/dL (ref 8–23)
CO2: 26 mmol/L (ref 22–32)
Calcium: 8.6 mg/dL — ABNORMAL LOW (ref 8.9–10.3)
Chloride: 90 mmol/L — ABNORMAL LOW (ref 98–111)
Creatinine, Ser: 0.7 mg/dL (ref 0.61–1.24)
GFR, Estimated: >60 mL/min (ref >60)
Glucose, Bld: 146 mg/dL — ABNORMAL HIGH (ref 70–99)
Potassium: 4.2 mmol/L (ref 3.5–5.1)
Sodium: 127 mmol/L — ABNORMAL LOW (ref 135–145)
Total Bilirubin: 0.6 mg/dL (ref 0.0–1.2)
Total Protein: 6.3 g/dL — ABNORMAL LOW (ref 6.5–8.1)

## 2024-04-25 LAB — BASIC METABOLIC PANEL WITH GFR
Anion gap: 10 (ref 5–15)
Anion gap: 11 (ref 5–15)
BUN: 17 mg/dL (ref 8–23)
BUN: 20 mg/dL (ref 8–23)
CO2: 27 mmol/L (ref 22–32)
CO2: 26 mmol/L (ref 22–32)
Calcium: 9 mg/dL (ref 8.9–10.3)
Calcium: 8.6 mg/dL — ABNORMAL LOW (ref 8.9–10.3)
Chloride: 98 mmol/L (ref 98–111)
Chloride: 96 mmol/L — ABNORMAL LOW (ref 98–111)
Creatinine, Ser: 0.61 mg/dL (ref 0.61–1.24)
Creatinine, Ser: 0.67 mg/dL (ref 0.61–1.24)
GFR, Estimated: >60 mL/min (ref >60)
GFR, Estimated: >60 mL/min (ref >60)
Glucose, Bld: 138 mg/dL — ABNORMAL HIGH (ref 70–99)
Glucose, Bld: 151 mg/dL — ABNORMAL HIGH (ref 70–99)
Potassium: 4 mmol/L (ref 3.5–5.1)
Potassium: 3.6 mmol/L (ref 3.5–5.1)
Sodium: 135 mmol/L (ref 135–145)
Sodium: 133 mmol/L — ABNORMAL LOW (ref 135–145)

## 2024-04-25 LAB — COOXEMETRY PANEL
Carboxyhemoglobin: 1.8 % — ABNORMAL HIGH (ref 0.5–1.5)
Carboxyhemoglobin: 1.8 % — ABNORMAL HIGH (ref 0.5–1.5)
Methemoglobin: 1.6 % — ABNORMAL HIGH (ref 0.0–1.5)
Methemoglobin: <0.7 % (ref 0.0–1.5)
O2 Saturation: 81.3 %
O2 Saturation: 57.5 %
Total hemoglobin: 14.5 g/dL (ref 12.0–16.0)
Total hemoglobin: 15.2 g/dL (ref 12.0–16.0)

## 2024-04-25 LAB — HEPARIN LEVEL (UNFRACTIONATED): Heparin Unfractionated: 0.37 [IU]/mL (ref 0.30–0.70)

## 2024-04-25 LAB — GLUCOSE, CAPILLARY
Glucose-Capillary: 136 mg/dL — ABNORMAL HIGH (ref 70–99)
Glucose-Capillary: 93 mg/dL (ref 70–99)
Glucose-Capillary: 116 mg/dL — ABNORMAL HIGH (ref 70–99)
Glucose-Capillary: 146 mg/dL — ABNORMAL HIGH (ref 70–99)

## 2024-04-25 MED ORDER — FUROSEMIDE 10 MG/ML IJ SOLN
80.0000 mg | Freq: Once | INTRAMUSCULAR | Status: AC
  Administered 2024-04-25: 80 mg via INTRAVENOUS
  Filled 2024-04-25: qty 8

## 2024-04-25 MED ORDER — MAGNESIUM SULFATE 2 GM/50ML IV SOLN
2.0000 g | Freq: Once | INTRAVENOUS | Status: AC
  Administered 2024-04-25: 2 g via INTRAVENOUS
  Filled 2024-04-25: qty 50

## 2024-04-25 MED ORDER — DOBUTAMINE-DEXTROSE 4-5 MG/ML-% IV SOLN
2.5000 ug/kg/min | INTRAVENOUS | Status: DC
  Administered 2024-04-25: 2.5 ug/kg/min via INTRAVENOUS
  Filled 2024-04-25: qty 250

## 2024-04-25 MED ORDER — ALBUTEROL SULFATE (2.5 MG/3ML) 0.083% IN NEBU: 2.5000 mg | INHALATION_SOLUTION | RESPIRATORY_TRACT | Status: DC | PRN

## 2024-04-25 MED ORDER — POTASSIUM CHLORIDE CRYS ER 20 MEQ PO TBCR
40.0000 meq | EXTENDED_RELEASE_TABLET | Freq: Once | ORAL | Status: AC
  Administered 2024-04-26: 40 meq via ORAL
  Filled 2024-04-25: qty 2

## 2024-04-25 MED ORDER — ALBUTEROL SULFATE (2.5 MG/3ML) 0.083% IN NEBU
INHALATION_SOLUTION | RESPIRATORY_TRACT | Status: AC
  Administered 2024-04-25: 2.5 mg via RESPIRATORY_TRACT
  Filled 2024-04-25: qty 3

## 2024-04-25 NOTE — Progress Notes (Signed)
 PHARMACY - ANTICOAGULATION CONSULT NOTE  Pharmacy Consult for heparin  infusion Indication: atrial fibrillation  Allergies  Allergen Reactions   No Known Allergies    Patient Measurements: Height: 5' 10 (177.8 cm) Weight: 73.3 kg (161 lb 9.6 oz) IBW/kg (Calculated) : 73 HEPARIN  DW (KG): 76.7  Vital Signs: Temp: 97.8 F (36.6 C) (10/26 0620) Temp Source: Oral (10/26 0620) BP: 94/71 (10/26 0930) Pulse Rate: 74 (10/26 0930)  Labs: Recent Labs    04/22/24 1116 04/22/24 1116 04/22/24 2247 04/23/24 0313 04/24/24 0500 04/25/24 0507  HGB  --    < >  --  14.4 14.4 13.8  HCT  --   --   --  42.6 42.6 41.4  PLT  --   --   --  111* 110* 121*  APTT 61*  --  69* 78*  --   --   HEPARINUNFRC 0.31  --   --  0.41 0.29* 0.37  CREATININE  --   --   --  0.71 0.78 0.61   < > = values in this interval not displayed.   Estimated Creatinine Clearance: 88.7 mL/min (by C-G formula based on SCr of 0.61 mg/dL).  Medical History: Past Medical History:  Diagnosis Date   Asthma    CAD (coronary artery disease) 01/23/2016   minor CAD at cath-40% RCA   CHF (congestive heart failure) (HCC)    aortic valve replacement   COPD (chronic obstructive pulmonary disease) (HCC)    denies patient said that a pulmonologist said he said that he didnt have COPD   Dyspnea    Dysrhythmia    hx afib   Heart murmur    had a murmur before the aortic valve replacement, no murmur according to dr. Mona per patient   Hypertension    Non-ischemic cardiomyopathy (HCC) 01/23/2016   EF 30-35%   Pneumonia    Severe aortic stenosis    SOBOE (shortness of breath on exertion)     Assessment: Patient is a 68 YO male presenting to the ED with shortness of breath, increased inhaler use needs, and fatigue. Troponins 218 > 259. Cardiology consulted for evaluation of CHF exacerbation and elevated troponins. PMH significant for atrial fibrillation on Xarelto  PTA, and bioprosthetic AVR. Pharmacy consulted to dose heparin   for atrial fibrillation.   Last dose of Xarelto  on 10/19 at 8pm.  04/25/24 AM: Heparin  level 0.37, therapeutic at heparin  1700 units/hr. No issues with infusion running or signs of bleeding per RN. CBC stable (Hgb 13.8, PLT 121).   Goal of Therapy:  Monitor platelets by anticoagulation protocol: Yes   Plan:  Continue heparin  1700 units/hr Monitor daily heparin  level, CBC, signs and symptoms of bleeding F/u transition to DOAC when no further procedures required  Thank you for allowing pharmacy to be a part of this patient's care.   Morna Breach, PharmD PGY2 Cardiology Pharmacy Resident 04/25/2024 9:54 AM

## 2024-04-25 NOTE — Plan of Care (Signed)
  Problem: Cardiac: Goal: Ability to achieve and maintain adequate cardiovascular perfusion will improve Outcome: Progressing   Problem: Coping: Goal: Level of anxiety will decrease Outcome: Progressing   Problem: Safety: Goal: Ability to remain free from injury will improve Outcome: Progressing   Problem: Skin Integrity: Goal: Risk for impaired skin integrity will decrease Outcome: Progressing

## 2024-04-26 ENCOUNTER — Inpatient Hospital Stay (HOSPITAL_COMMUNITY)

## 2024-04-26 DIAGNOSIS — I350 Nonrheumatic aortic (valve) stenosis: Secondary | ICD-10-CM | POA: Diagnosis not present

## 2024-04-26 DIAGNOSIS — I5023 Acute on chronic systolic (congestive) heart failure: Secondary | ICD-10-CM | POA: Diagnosis not present

## 2024-04-26 DIAGNOSIS — R570 Cardiogenic shock: Secondary | ICD-10-CM | POA: Diagnosis not present

## 2024-04-26 LAB — URINALYSIS, ROUTINE W REFLEX MICROSCOPIC
Bilirubin Urine: NEGATIVE
Glucose, UA: NEGATIVE mg/dL
Hgb urine dipstick: NEGATIVE
Ketones, ur: NEGATIVE mg/dL
Leukocytes,Ua: NEGATIVE
Nitrite: NEGATIVE
Protein, ur: NEGATIVE mg/dL
Specific Gravity, Urine: 1.014 (ref 1.005–1.030)
pH: 5 (ref 5.0–8.0)

## 2024-04-26 LAB — CBC
HCT: 42.9 % (ref 39.0–52.0)
Hemoglobin: 14.3 g/dL (ref 13.0–17.0)
MCH: 32.9 pg (ref 26.0–34.0)
MCHC: 33.3 g/dL (ref 30.0–36.0)
MCV: 98.8 fL (ref 80.0–100.0)
Platelets: 141 K/uL — ABNORMAL LOW (ref 150–400)
RBC: 4.34 MIL/uL (ref 4.22–5.81)
RDW: 13.7 % (ref 11.5–15.5)
WBC: 7.1 K/uL (ref 4.0–10.5)
nRBC: 0 % (ref 0.0–0.2)

## 2024-04-26 LAB — COOXEMETRY PANEL
Carboxyhemoglobin: 2.3 % — ABNORMAL HIGH (ref 0.5–1.5)
Methemoglobin: 0.8 % (ref 0.0–1.5)
O2 Saturation: 69.1 %
Total hemoglobin: 14.9 g/dL (ref 12.0–16.0)

## 2024-04-26 LAB — MAGNESIUM: Magnesium: 2.5 mg/dL — ABNORMAL HIGH (ref 1.7–2.4)

## 2024-04-26 LAB — BASIC METABOLIC PANEL WITH GFR
Anion gap: 12 (ref 5–15)
BUN: 16 mg/dL (ref 8–23)
CO2: 26 mmol/L (ref 22–32)
Calcium: 9 mg/dL (ref 8.9–10.3)
Chloride: 94 mmol/L — ABNORMAL LOW (ref 98–111)
Creatinine, Ser: 0.68 mg/dL (ref 0.61–1.24)
GFR, Estimated: >60 mL/min (ref >60)
Glucose, Bld: 159 mg/dL — ABNORMAL HIGH (ref 70–99)
Potassium: 4.6 mmol/L (ref 3.5–5.1)
Sodium: 132 mmol/L — ABNORMAL LOW (ref 135–145)

## 2024-04-26 LAB — GLUCOSE, CAPILLARY
Glucose-Capillary: 141 mg/dL — ABNORMAL HIGH (ref 70–99)
Glucose-Capillary: 128 mg/dL — ABNORMAL HIGH (ref 70–99)
Glucose-Capillary: 106 mg/dL — ABNORMAL HIGH (ref 70–99)
Glucose-Capillary: 127 mg/dL — ABNORMAL HIGH (ref 70–99)

## 2024-04-26 LAB — HEPARIN LEVEL (UNFRACTIONATED): Heparin Unfractionated: 0.39 [IU]/mL (ref 0.30–0.70)

## 2024-04-26 MED ORDER — FUROSEMIDE 10 MG/ML IJ SOLN
40.0000 mg | Freq: Once | INTRAMUSCULAR | Status: AC
  Administered 2024-04-26: 40 mg via INTRAVENOUS
  Filled 2024-04-26: qty 4

## 2024-04-26 MED ORDER — HEPARIN 30,000 UNITS/1000 ML (OHS) CELLSAVER SOLUTION
Status: DC
Start: 2024-04-27 — End: 2024-04-28
  Filled 2024-04-26: qty 1000

## 2024-04-26 MED ORDER — BISACODYL 5 MG PO TBEC
5.0000 mg | DELAYED_RELEASE_TABLET | Freq: Once | ORAL | Status: AC
  Administered 2024-04-26: 5 mg via ORAL
  Filled 2024-04-26: qty 1

## 2024-04-26 MED ORDER — DEXMEDETOMIDINE HCL IN NACL 400 MCG/100ML IV SOLN
0.1000 ug/kg/h | INTRAVENOUS | Status: AC
Start: 2024-04-27 — End: 2024-04-28
  Administered 2024-04-27: 71.32 ug via INTRAVENOUS
  Administered 2024-04-27: .7 ug/kg/h via INTRAVENOUS
  Filled 2024-04-26: qty 100

## 2024-04-26 MED ORDER — MAGNESIUM SULFATE 50 % IJ SOLN
40.0000 meq | INTRAMUSCULAR | Status: DC
  Filled 2024-04-26: qty 9.85

## 2024-04-26 MED ORDER — CEFAZOLIN SODIUM-DEXTROSE 2-4 GM/100ML-% IV SOLN
2.0000 g | INTRAVENOUS | Status: AC
Start: 2024-04-27 — End: 2024-04-28
  Administered 2024-04-27: 2 g via INTRAVENOUS
  Filled 2024-04-26: qty 100

## 2024-04-26 MED ORDER — POTASSIUM CHLORIDE 2 MEQ/ML IV SOLN
80.0000 meq | INTRAVENOUS | Status: DC
Start: 2024-04-27 — End: 2024-04-28
  Filled 2024-04-26: qty 40

## 2024-04-26 MED ORDER — NOREPINEPHRINE 4 MG/250ML-% IV SOLN
0.0000 ug/min | INTRAVENOUS | Status: AC
  Administered 2024-04-27: 10 ug/min via INTRAVENOUS
  Filled 2024-04-26: qty 250

## 2024-04-26 MED ORDER — CHLORHEXIDINE GLUCONATE 4 % EX SOLN
1.0000 | Freq: Once | CUTANEOUS | Status: AC
  Administered 2024-04-27: 1 via TOPICAL
  Filled 2024-04-26: qty 60

## 2024-04-26 MED ORDER — TEMAZEPAM 7.5 MG PO CAPS: 15.0000 mg | ORAL_CAPSULE | Freq: Once | ORAL | Status: DC | PRN

## 2024-04-26 MED ORDER — CHLORHEXIDINE GLUCONATE 0.12 % MT SOLN
15.0000 mL | Freq: Once | OROMUCOSAL | Status: AC
  Administered 2024-04-27: 15 mL via OROMUCOSAL
  Filled 2024-04-26: qty 15

## 2024-04-26 NOTE — Progress Notes (Addendum)
 Advanced Heart Failure Rounding Note  Cardiologist: Vinie JAYSON Maxcy, MD  AHF: Dr. Cherrie  Chief Complaint: Cardiogenic Shock D/T Severe AoV Stenosis  Patient Profile   68 y/o male with h/o bioprosthetic AVR in 2017, PAF, PAD, COPD and tobacco use, admitted w/ CS in the setting of severe prosthetic aortic valve stenosis.   Admit Echo AoV mean gradient 49 mmHg. LVEF 20-25% (down from 40-45%), RV ok.   Subjective:    Co-ox 58%, started back on DBA 2.5, now co-ox 69%. Some shortness of breath overnight, requiring dose IV Lasix . Net - 1L Remains on NE 7 Plan for VIV TAVR tomorrow.  Sitting up in the chair, shortness of breath has improved. No CP, dizziness.   Objective:    Weight Range: 71.3 kg Body mass index is 22.55 kg/m.   Vital Signs:   Temp:  [97.8 F (36.6 C)-99.7 F (37.6 C)] 97.8 F (36.6 C) (10/27 0616) Pulse Rate:  [71-145] 78 (10/27 0700) Resp:  [16-36] 18 (10/27 0700) BP: (84-114)/(56-81) 88/72 (10/27 0700) SpO2:  [86 %-97 %] 95 % (10/27 0700) Weight:  [71.3 kg] 71.3 kg (10/27 0616) Last BM Date : 04/25/24  Weight change: Filed Weights   04/19/24 2026 04/23/24 0630 04/26/24 0616  Weight: 73.8 kg 73.3 kg 71.3 kg   Intake/Output:  Intake/Output Summary (Last 24 hours) at 04/26/2024 1117 Last data filed at 04/26/2024 1054 Gross per 24 hour  Intake 1862.48 ml  Output 2900 ml  Net -1037.52 ml    Physical Exam    General: Elderly appearing. No distress on RA Cardiac:  S1 and S2 present. Systolic murmur 4/6 Extremities: Warm and dry.  Trace BLE edema.  Neuro: Alert and oriented x3. Affect pleasant. Moves all extremities without difficulty.  Telemetry   SR 80-90s, brief ST 140-150s for 5 min overnight (personally reviewed)  Labs    CBC Recent Labs    04/25/24 0507 04/26/24 0341  WBC 5.3 7.1  HGB 13.8 14.3  HCT 41.4 42.9  MCV 98.8 98.8  PLT 121* 141*   Basic Metabolic Panel Recent Labs    89/73/74 2239 04/26/24 0341  NA 133*  132*  K 3.6 4.6  CL 96* 94*  CO2 26 26  GLUCOSE 151* 159*  BUN 20 16  CREATININE 0.67 0.68  CALCIUM  8.6* 9.0  MG 1.9 2.5*   Liver Function Tests Recent Labs    04/24/24 0500 04/25/24 2124  AST 23 47*  ALT 19 46*  ALKPHOS 33* 47  BILITOT 0.9 0.6  PROT 6.2* 6.3*  ALBUMIN  3.2* 3.2*   BNP (last 3 results) Recent Labs    03/25/24 1359 04/19/24 1133  BNP 1,332.6* 1,400.2*   Hemoglobin A1C No results for input(s): HGBA1C in the last 72 hours.  Medications:    Scheduled Medications:  aspirin  EC  81 mg Oral Daily   atorvastatin   10 mg Oral Daily   Chlorhexidine  Gluconate Cloth  6 each Topical Daily   finasteride   5 mg Oral QPM   polyethylene glycol  17 g Oral BID   senna  1 tablet Oral BID   sodium chloride  flush  10-40 mL Intracatheter Q12H   sodium chloride  flush  3 mL Intravenous Q12H   sodium chloride  flush  3 mL Intravenous Q12H   tamsulosin   0.4 mg Oral QPC supper   traZODone  50 mg Oral QHS    Infusions:  DOBUTamine 2.5 mcg/kg/min (04/26/24 0700)   heparin  1,700 Units/hr (04/26/24 0700)   norepinephrine (LEVOPHED)  Adult infusion 11 mcg/min (04/26/24 0700)    PRN Medications: acetaminophen , albuterol , ondansetron  (ZOFRAN ) IV, mouth rinse, sodium chloride  flush, sodium chloride  flush, sodium chloride  flush  Assessment/Plan   1. Acute on chronic systolic CHF/cardiogenic shock: Echo this admission with EF 20-25%, moderate LV dilation,  RV normal, moderate-severe MR, bioprosthetic aortic valve with severe stenosis and mean gradient 49 with mild-moderate AI, IVC not dilated.  Prior echo had shown EF 45-50%.  In the past, EF dropped with severe AS.  Suspect this is the cause again.  LHC in 2017 showed nonobstructive CAD, LHC this admission showed 95% stenosis large RPLV branch.  On RHC, RA pressure was low but PCWP elevated.  - DBA restarted for pulm edema and co-ox drop. Continue DBA 2.5 - remains on NE - CXR with bilateral pulmonary congestion - given IV  Lasix  overnight 80 mg, redose with 40 mg IV this morning.  - no GDMT currently given low BP requiring NE  - Has been seen by TCTS. Plan TAVR tomorrow  2. Aortic stenosis: Severe bioprosthetic AS with mild-moderate AI. He initially had AVR for bicuspid aortic valve. Structural heart team has seen for evaluation for valve-in-valve TAVR. Unfortunately, pre-TAVR peripheral vascular scans show severe peripheral artery disease that will likely preclude TF access  - plan for VIV TAVR on Tues 10/28, - R TF +/- Shockwave lithotripsy, not a bailout candidate. D/w TCTS  3. Spiculated Left Apical Pulmonary Nodule/ Prostatomegaly - incidental findings on TAVR protocol CTA - plan evaluation by PET-CT as outpatient -> d/w Oncology - F/u PSA reassuring, normal at 1.9  mg/ml, can follow w/ outpatient urology   4. CAD: No chest pain, minimally elevated troponin with no trend.  Suspect demand ischemia from shock/CHF.  LHC shows 95% stenosis in large RPLV branch.  For the time being, will manage medically.  - ASA 81 mg daily - Continue statin.  - No s/s angina  5. Atrial fibrillation: Paroxysmal.  - Remains in NSR - Continue heparin  gtt.   6 PAD: History of peripheral intervention in 2018.    7. Smoking/suspected COPD: Needs to quit.    CRITICAL CARE Performed by: Jordan Lee  Total critical care time: 9 minutes  -Critical care time was exclusive of separately billable procedures and treating other patients. -Critical care was necessary to treat or prevent imminent or life-threatening deterioration. -Critical care was time spent personally by me on the following activities: development of treatment plan with patient and/or surrogate as well as nursing, discussions with consultants, evaluation of patient's response to treatment, examination of patient, obtaining history from patient or surrogate, ordering and performing treatments and interventions, ordering and review of laboratory studies, ordering and  review of radiographic studies, pulse oximetry and re-evaluation of patient's condition.  Length of Stay: 7  Jordan Lee, NP  04/26/2024, 11:17 AM  Advanced Heart Failure Team Pager [phone] (M-F; 7a - 5p)  Please contact [facility] for night-coverage after hours (5p -7a ) and weekends on [email or web address]  Agree with above.   Developed pulmonary edema overnight. DBA added back. NE increased. Feeling a bit better  General:  Sitting up in chair. No resp difficulty HEENT: normal Neck: supple.JVP 5-6 Cor: Regular rate & rhythm. 2/6 AS Lungs: decreased throughout + crackles Abdomen: soft, nontender, nondistended.Good bowel sounds. Extremities: no cyanosis, clubbing, rash, edema Neuro: alert & orientedx3, cranial nerves grossly intact. moves all 4 extremities w/o difficulty. Affect pleasant  He had recurrent pulmonary in setting of critical AS.   Continue current  support. Give additional dose of lasix .  TAVR tomorrow.   CRITICAL CARE Performed by: Cherrie Sieving  Total critical care time: 37 minutes  Critical care time was exclusive of separately billable procedures and treating other patients.  Critical care was necessary to treat or prevent imminent or life-threatening deterioration.  Critical care was time spent personally by me (independent of midlevel providers or residents) on the following activities: development of treatment plan with patient and/or surrogate as well as nursing, discussions with consultants, evaluation of patient's response to treatment, examination of patient, obtaining history from patient or surrogate, ordering and performing treatments and interventions, ordering and review of laboratory studies, ordering and review of radiographic studies, pulse oximetry and re-evaluation of patient's condition.  Sieving Cherrie, MD  11:54 AM

## 2024-04-26 NOTE — Progress Notes (Signed)
  [facility]   MULTIDISCIPLINARY HEART VALVE TEAM   Plan inpatient VIV TAVR tomorrow with Dr. Wendel and Dr. Daniel. He is posted as 5th case in the cathlab. Inpatient surgery orders placed. NPO after midnight.   [name] PA-C  [facility]

## 2024-04-26 NOTE — Progress Notes (Signed)
 PHARMACY - ANTICOAGULATION CONSULT NOTE  Pharmacy Consult for heparin  infusion Indication: atrial fibrillation  Allergies  Allergen Reactions   No Known Allergies    Patient Measurements: Height: 5' 10 (177.8 cm) Weight: 71.3 kg (157 lb 3 oz) IBW/kg (Calculated) : 73 HEPARIN  DW (KG): 76.7  Vital Signs: Temp: 97.8 F (36.6 C) (10/27 0616) Temp Source: Oral (10/27 0616) BP: 95/69 (10/27 0600) Pulse Rate: 85 (10/27 0600)  Labs: Recent Labs    04/24/24 0500 04/25/24 0507 04/25/24 2124 04/25/24 2239 04/26/24 0341  HGB 14.4 13.8  --   --  14.3  HCT 42.6 41.4  --   --  42.9  PLT 110* 121*  --   --  141*  HEPARINUNFRC 0.29* 0.37  --   --  0.39  CREATININE 0.78 0.61 0.70 0.67 0.68   Estimated Creatinine Clearance: 86.6 mL/min (by C-G formula based on SCr of 0.68 mg/dL).  Medical History: Past Medical History:  Diagnosis Date   Asthma    CAD (coronary artery disease) 01/23/2016   minor CAD at cath-40% RCA   CHF (congestive heart failure) (HCC)    aortic valve replacement   COPD (chronic obstructive pulmonary disease) (HCC)    denies patient said that a pulmonologist said he said that he didnt have COPD   Dyspnea    Dysrhythmia    hx afib   Heart murmur    had a murmur before the aortic valve replacement, no murmur according to dr. Mona per patient   Hypertension    Non-ischemic cardiomyopathy (HCC) 01/23/2016   EF 30-35%   Pneumonia    Severe aortic stenosis    SOBOE (shortness of breath on exertion)     Assessment: Patient is a 68 YO male presenting to the ED with shortness of breath, increased inhaler use needs, and fatigue. Troponins 218 > 259. Cardiology consulted for evaluation of CHF exacerbation and elevated troponins. PMH significant for atrial fibrillation on Xarelto  PTA, and bioprosthetic AVR. Pharmacy consulted to dose heparin  for atrial fibrillation.   Last dose of Xarelto  on 10/19 at 8pm.  Heparin  level 0.39 is therapeutic with heparin  running at  1700 units/hr. Hgb (14.3) and PLTs (141) are stable. Per RN, no report of pauses, issues with the line, or signs of bleeding.    Goal of Therapy:  Monitor platelets by anticoagulation protocol: Yes   Plan:  Continue heparin  1700 units/hr Monitor daily heparin  level, CBC, signs and symptoms of bleeding F/u [facility] plans after TAVR procedure tomorrow  Thank you for allowing pharmacy to be a part of this patient's care.   Nidia Schaffer, PharmD PGY2 Cardiology Pharmacy Resident  Please check AMION for all [facility] Pharmacy phone numbers After 10:00 PM, call Main Pharmacy [phone] 04/26/2024 6:49 AM

## 2024-04-27 ENCOUNTER — Inpatient Hospital Stay (HOSPITAL_COMMUNITY): Admitting: Anesthesiology

## 2024-04-27 ENCOUNTER — Encounter (HOSPITAL_COMMUNITY): Admission: EM | Disposition: A | Payer: Self-pay | Source: Home / Self Care | Attending: Internal Medicine

## 2024-04-27 ENCOUNTER — Inpatient Hospital Stay (HOSPITAL_COMMUNITY)

## 2024-04-27 ENCOUNTER — Encounter (HOSPITAL_COMMUNITY): Payer: Self-pay | Admitting: Internal Medicine

## 2024-04-27 ENCOUNTER — Other Ambulatory Visit: Payer: Self-pay | Admitting: Physician Assistant

## 2024-04-27 DIAGNOSIS — Z952 Presence of prosthetic heart valve: Secondary | ICD-10-CM

## 2024-04-27 DIAGNOSIS — I2510 Atherosclerotic heart disease of native coronary artery without angina pectoris: Secondary | ICD-10-CM

## 2024-04-27 DIAGNOSIS — R918 Other nonspecific abnormal finding of lung field: Secondary | ICD-10-CM

## 2024-04-27 DIAGNOSIS — I350 Nonrheumatic aortic (valve) stenosis: Secondary | ICD-10-CM | POA: Diagnosis not present

## 2024-04-27 DIAGNOSIS — I5023 Acute on chronic systolic (congestive) heart failure: Secondary | ICD-10-CM | POA: Diagnosis not present

## 2024-04-27 DIAGNOSIS — I10 Essential (primary) hypertension: Secondary | ICD-10-CM

## 2024-04-27 DIAGNOSIS — Z006 Encounter for examination for normal comparison and control in clinical research program: Secondary | ICD-10-CM | POA: Diagnosis not present

## 2024-04-27 DIAGNOSIS — F17210 Nicotine dependence, cigarettes, uncomplicated: Secondary | ICD-10-CM

## 2024-04-27 DIAGNOSIS — R570 Cardiogenic shock: Secondary | ICD-10-CM | POA: Diagnosis not present

## 2024-04-27 HISTORY — PX: INTRAOPERATIVE TRANSTHORACIC ECHOCARDIOGRAM: SHX6523

## 2024-04-27 LAB — ECHOCARDIOGRAM LIMITED
AR max vel: 1.68 cm2
AV Area VTI: 1.84 cm2
AV Area mean vel: 2.19 cm2
AV Mean grad: 8 mmHg
AV Peak grad: 15.5 mmHg
Ao pk vel: 1.97 m/s
Est EF: 25
Height: 70 in
Weight: 2515.01 [oz_av]

## 2024-04-27 LAB — BASIC METABOLIC PANEL WITH GFR
Anion gap: 7 (ref 5–15)
Anion gap: 13 (ref 5–15)
BUN: 22 mg/dL (ref 8–23)
BUN: 19 mg/dL (ref 8–23)
CO2: 28 mmol/L (ref 22–32)
CO2: 26 mmol/L (ref 22–32)
Calcium: 8.9 mg/dL (ref 8.9–10.3)
Calcium: 8.9 mg/dL (ref 8.9–10.3)
Chloride: 95 mmol/L — ABNORMAL LOW (ref 98–111)
Chloride: 93 mmol/L — ABNORMAL LOW (ref 98–111)
Creatinine, Ser: 0.76 mg/dL (ref 0.61–1.24)
Creatinine, Ser: 0.61 mg/dL (ref 0.61–1.24)
GFR, Estimated: >60 mL/min (ref >60)
GFR, Estimated: >60 mL/min (ref >60)
Glucose, Bld: 187 mg/dL — ABNORMAL HIGH (ref 70–99)
Glucose, Bld: 128 mg/dL — ABNORMAL HIGH (ref 70–99)
Potassium: 3.9 mmol/L (ref 3.5–5.1)
Potassium: 3.7 mmol/L (ref 3.5–5.1)
Sodium: 130 mmol/L — ABNORMAL LOW (ref 135–145)
Sodium: 132 mmol/L — ABNORMAL LOW (ref 135–145)

## 2024-04-27 LAB — TYPE AND SCREEN
ABO/RH(D): O POS
Antibody Screen: NEGATIVE

## 2024-04-27 LAB — MAGNESIUM
Magnesium: 2 mg/dL (ref 1.7–2.4)
Magnesium: 1.7 mg/dL (ref 1.7–2.4)

## 2024-04-27 LAB — COOXEMETRY PANEL
Carboxyhemoglobin: 1.5 % (ref 0.5–1.5)
Methemoglobin: <0.7 % (ref 0.0–1.5)
O2 Saturation: 65.7 %
Total hemoglobin: 14.7 g/dL (ref 12.0–16.0)

## 2024-04-27 LAB — CBC
HCT: 40.7 % (ref 39.0–52.0)
Hemoglobin: 14 g/dL (ref 13.0–17.0)
MCH: 33.6 pg (ref 26.0–34.0)
MCHC: 34.4 g/dL (ref 30.0–36.0)
MCV: 97.6 fL (ref 80.0–100.0)
Platelets: 157 K/uL (ref 150–400)
RBC: 4.17 MIL/uL — ABNORMAL LOW (ref 4.22–5.81)
RDW: 13.7 % (ref 11.5–15.5)
WBC: 7.2 K/uL (ref 4.0–10.5)
nRBC: 0 % (ref 0.0–0.2)

## 2024-04-27 LAB — POCT ACTIVATED CLOTTING TIME: Activated Clotting Time: 0 s

## 2024-04-27 LAB — HEPARIN LEVEL (UNFRACTIONATED): Heparin Unfractionated: 0.47 [IU]/mL (ref 0.30–0.70)

## 2024-04-27 LAB — PROTIME-INR
INR: 1 (ref 0.8–1.2)
Prothrombin Time: 13.4 s (ref 11.4–15.2)

## 2024-04-27 LAB — GLUCOSE, CAPILLARY
Glucose-Capillary: 126 mg/dL — ABNORMAL HIGH (ref 70–99)
Glucose-Capillary: 133 mg/dL — ABNORMAL HIGH (ref 70–99)
Glucose-Capillary: 189 mg/dL — ABNORMAL HIGH (ref 70–99)

## 2024-04-27 SURGERY — TRANSCATHETER AORTIC VALVE REPLACEMENT, TRANSFEMORAL (CATHLAB)
Anesthesia: Monitor Anesthesia Care | Laterality: Right

## 2024-04-27 MED ORDER — OXYCODONE HCL 5 MG PO TABS: 5.0000 mg | ORAL_TABLET | ORAL | Status: DC | PRN

## 2024-04-27 MED ORDER — ONDANSETRON HCL 4 MG/2ML IJ SOLN
INTRAMUSCULAR | Status: DC | PRN
  Administered 2024-04-27: 4 mg via INTRAVENOUS

## 2024-04-27 MED ORDER — SODIUM CHLORIDE 0.9 % IV SOLN: INTRAVENOUS | Status: DC | PRN

## 2024-04-27 MED ORDER — SODIUM CHLORIDE 0.9% FLUSH: 3.0000 mL | INTRAVENOUS | Status: DC | PRN

## 2024-04-27 MED ORDER — NOREPINEPHRINE BITARTRATE 1 MG/ML IV SOLN
INTRAVENOUS | Status: DC | PRN
  Administered 2024-04-27 (×4): 2 mL via INTRAVENOUS
  Administered 2024-04-27 (×2): 1 mL via INTRAVENOUS

## 2024-04-27 MED ORDER — CEFAZOLIN SODIUM-DEXTROSE 2-4 GM/100ML-% IV SOLN
2.0000 g | Freq: Three times a day (TID) | INTRAVENOUS | Status: AC
  Administered 2024-04-27 – 2024-04-28 (×2): 2 g via INTRAVENOUS
  Filled 2024-04-27 (×2): qty 100

## 2024-04-27 MED ORDER — LIDOCAINE HCL (PF) 1 % IJ SOLN
INTRAMUSCULAR | Status: DC | PRN
  Administered 2024-04-27: 2 mL

## 2024-04-27 MED ORDER — HEPARIN SODIUM (PORCINE) 1000 UNIT/ML IJ SOLN: INTRAMUSCULAR | Status: DC | PRN

## 2024-04-27 MED ORDER — CLEVIDIPINE BUTYRATE 0.5 MG/ML IV EMUL
INTRAVENOUS | Status: DC | PRN
  Administered 2024-04-27: 2 mg/h via INTRAVENOUS

## 2024-04-27 MED ORDER — IODIXANOL 320 MG/ML IV SOLN
INTRAVENOUS | Status: DC | PRN
  Administered 2024-04-27: 20 mL via INTRA_ARTERIAL

## 2024-04-27 MED ORDER — PROPOFOL 10 MG/ML IV BOLUS
INTRAVENOUS | Status: DC | PRN
  Administered 2024-04-27: 20 mg via INTRAVENOUS
  Administered 2024-04-27: 40 mg via INTRAVENOUS
  Administered 2024-04-27: 20 mg via INTRAVENOUS

## 2024-04-27 MED ORDER — FUROSEMIDE 10 MG/ML IJ SOLN
INTRAMUSCULAR | Status: AC
  Filled 2024-04-27: qty 4

## 2024-04-27 MED ORDER — SODIUM CHLORIDE 0.9% FLUSH
3.0000 mL | Freq: Two times a day (BID) | INTRAVENOUS | Status: DC
  Administered 2024-04-28 – 2024-05-04 (×8): 3 mL via INTRAVENOUS

## 2024-04-27 MED ORDER — FUROSEMIDE 10 MG/ML IJ SOLN
INTRAMUSCULAR | Status: DC | PRN
Start: 2024-04-27 — End: 2024-04-27
  Administered 2024-04-27: 40 mg via INTRAMUSCULAR

## 2024-04-27 MED ORDER — SODIUM CHLORIDE 0.9 % IV SOLN: INTRAVENOUS | Status: AC

## 2024-04-27 MED ORDER — MORPHINE SULFATE (PF) 2 MG/ML IV SOLN: 1.0000 mg | INTRAVENOUS | Status: DC | PRN

## 2024-04-27 MED ORDER — PROTAMINE SULFATE 10 MG/ML IV SOLN
INTRAVENOUS | Status: DC | PRN
  Administered 2024-04-27: 100 mg via INTRAVENOUS

## 2024-04-27 MED ORDER — HEPARIN SODIUM (PORCINE) 1000 UNIT/ML IJ SOLN
INTRAMUSCULAR | Status: DC | PRN
  Administered 2024-04-27: 10000 [IU] via INTRAVENOUS

## 2024-04-27 MED ORDER — SODIUM CHLORIDE 0.9 % IV SOLN: 250.0000 mL | INTRAVENOUS | Status: DC | PRN

## 2024-04-27 MED ORDER — LACTATED RINGERS IV SOLN: INTRAVENOUS | Status: DC | PRN

## 2024-04-27 SURGICAL SUPPLY — 29 items
BAG SNAP BAND KOVER 36X36 (MISCELLANEOUS) ×4 IMPLANT
CATH 26 ULTRA DELIVERY (CATHETERS) IMPLANT
CATH DIAG 6FR PIGTAIL ANGLED (CATHETERS) IMPLANT
CATH INFINITI 5FR ANG PIGTAIL (CATHETERS) IMPLANT
CATH INFINITI 6F AL1 (CATHETERS) IMPLANT
CLOSURE PERCLOSE PROSTYLE (Vascular Products) IMPLANT
CRIMPER (MISCELLANEOUS) IMPLANT
DEVICE INFLATION ATRION QL2530 (MISCELLANEOUS) IMPLANT
GLIDESHEATH SLEND SS 6F .021 (SHEATH) IMPLANT
KIT SAPIAN 3 ULTRA RESILIA 26 (Valve) IMPLANT
PACK CARDIAC CATHETERIZATION (CUSTOM PROCEDURE TRAY) ×2 IMPLANT
PAD SORBX EP SHIELD 16.5X12 (MISCELLANEOUS) IMPLANT
SET ATX-X65L (MISCELLANEOUS) IMPLANT
SHEATH INTRODUCER SET 20-26 (SHEATH) IMPLANT
SHEATH PINNACLE 5F 10CM (SHEATH) IMPLANT
SHEATH PINNACLE 6F 10CM (SHEATH) IMPLANT
SHEATH PINNACLE 8F 10CM (SHEATH) IMPLANT
SHEATH PROBE COVER 6X72 (BAG) IMPLANT
STOPCOCK MORSE 400PSI 3WAY (MISCELLANEOUS) ×4 IMPLANT
SYR CONTROL 10ML ANGIOGRAPHIC (SYRINGE) IMPLANT
TRANSDUCER W/STOPCOCK (MISCELLANEOUS) IMPLANT
TUBING ART PRESS 72 MALE/FEM (TUBING) IMPLANT
TUBING CIL FLEX 10 FLL-RA (TUBING) IMPLANT
WIRE AMPLATZ SS-J .035X180CM (WIRE) IMPLANT
WIRE EMERALD 3MM-J .035X150CM (WIRE) IMPLANT
WIRE EMERALD 3MM-J .035X260CM (WIRE) IMPLANT
WIRE G STIF.035X180 STR (WIRE) IMPLANT
WIRE MICRO SET SILHO 5FR 7 (SHEATH) IMPLANT
WIRE SAFARI SM CURVE 275 (WIRE) IMPLANT

## 2024-04-27 NOTE — Op Note (Signed)
 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 Severe left ventricular systolic dysfunction function  Post-operative Echo Findings: No paravalvular leak Unchanged left ventricular systolic function  Left Heart Catheterization Findings: Left ventricular end-diastolic pressure of 33 mmHg   BRIEF CLINICAL NOTE AND INDICATIONS FOR SURGERY  The patient is a 68 year old male with a history of bicuspid aortic valve status post 25 mm Magna Ease aortic valve replacement in 2017, nonischemic cardiomyopathy, coronary artery disease with subtotally occluded mid right coronary artery, patiently atrial fibrillation on Xarelto , COPD, ongoing tobacco abuse, and probable bronchogenic carcinoma who presents with severe AAS and AI and cardiogenic shock.  Oncology opinion is that the patient likely has a greater than 1 year life expectancy.  Given ongoing cardiogenic shock in the context of bioprosthetic aortic valve degeneration, the patient is referred for urgent valve in valve transcatheter aortic valve replacement with a 26 mm SAPIEN 3 valve from the right transfemoral approach.  During the course of the patient's preoperative work up they have been evaluated comprehensively by a multidisciplinary team of specialists coordinated through the Multidisciplinary Heart Valve Clinic in the [facility].  They have been demonstrated to suffer from  symptomatic severe aortic stenosis as noted above. The patient has been counseled extensively as to the relative risks and benefits of all options for the treatment of severe aortic stenosis including long term medical therapy, conventional surgery for aortic valve replacement, and transcatheter aortic valve replacement.  The patient has been independently evaluated by Dr. Clunes with CT surgery and they are felt to be at high risk for conventional surgical aortic valve replacement. The surgeon indicated the patient would be a poor candidate for conventional surgery. Based upon review of all of the patient's preoperative diagnostic tests they are felt to be candidate for transcatheter aortic valve replacement using the transfemoral approach as an alternative to high risk conventional surgery.    Following the decision to proceed with transcatheter aortic valve replacement, a discussion has been held regarding what types of management strategies would be attempted intraoperatively in the event of life-threatening complications, including whether or not the patient would be considered a candidate for the use of cardiopulmonary bypass and/or conversion to open sternotomy for attempted surgical intervention.  The patient has been advised of a variety of complications that might develop peculiar to this approach including but not limited to risks of death, stroke, paravalvular leak, aortic dissection or other major vascular complications, aortic annulus rupture, device embolization, cardiac rupture or perforation, acute myocardial infarction, arrhythmia, heart block or bradycardia requiring permanent pacemaker placement, congestive heart failure, respiratory failure, renal failure, pneumonia, infection, other late complications related to structural valve deterioration or migration, or other complications that might ultimately cause a temporary or permanent loss of functional independence or other long term morbidity.  The  patient provides full informed consent for the procedure as described and all questions were answered preoperatively.    DETAILS OF THE OPERATIVE PROCEDURE  PREPARATION:   The patient is brought to the operating  room on the above mentioned date and central monitoring was established by the anesthesia team. The patient is placed in the supine position on the operating table.  Intravenous antibiotics are administered. Conscious sedation is used.   Baseline transthoracic echocardiogram was performed. The patient's chest, abdomen, both groins, and both lower extremities are prepared and draped in a sterile manner. A time out procedure is performed.   PERIPHERAL ACCESS:   Using the modified Seldinger technique,  venous access were obtained with placement of a 6 Fr sheath in the right femoral vein using u/s guidance.    TRANSFEMORAL ACCESS:  A micropuncture kit was used to gain access to the right common femoral artery using u/s guidance. Position confirmed with angiography. Pre-closure with double ProGlide closure devices. The patient was heparinized systemically and ACT verified > 250 seconds.    A 16 Fr transfemoral E-sheath was introduced into the right common femoral artery after progressively dilating over an Amplatz superstiff wire. An AL-1 catheter was used to direct a straight-tip stiff Glidewire across the native bioprosthetic aortic valve into the left ventricle. This was exchanged out for a pigtail catheter and position was confirmed in the LV apex. Simultaneous left ventricular, aortic, and left ventricular end-diastolic pressures were recorded.  The pigtail catheter was then exchanged for an Safari wire in the LV apex.  Direct LV pacing thresholds were assessed and found to be adequate.   TRANSCATHETER HEART VALVE DEPLOYMENT:  An Edwards Sapien 3 THV (size 26 mm) was prepared and crimped per manufacturer's guidelines, and the proper orientation of the valve is confirmed on the [employer] delivery system. The valve was advanced through the introducer sheath using normal technique until in an appropriate position in the abdominal aorta beyond the sheath tip. The balloon was then retracted and using the fine-tuning wheel was centered on the valve. The valve was then advanced across the aortic arch using appropriate flexion of the catheter. The valve was carefully positioned across the aortic valve annulus. The Commander catheter was retracted using normal technique. Once final position of the valve has been confirmed by angiographic assessment, the valve is deployed while temporarily holding ventilation and during rapid ventricular pacing to maintain systolic blood pressure < 50 mmHg and pulse pressure < 10 mmHg. The balloon inflation is held for >3 seconds after reaching full deployment volume. Once the balloon has fully deflated the balloon is retracted into the ascending aorta and valve function is assessed using TTE. There is felt to be no paravalvular leak and no central aortic insufficiency.  The patient's hemodynamic recovery following valve deployment is good.  The deployment balloon and guidewire are both removed. Echo demostrated acceptable post-procedural gradients, stable mitral valve function, and no AI.   PROCEDURE COMPLETION:  The sheath was then removed and closure devices were completed. Protamine  was administered once femoral arterial repair was complete.   The patient tolerated the procedure well and is transported to the surgical intensive care in stable condition. There were no immediate intraoperative complications. All sponge instrument and needle counts are verified correct at completion of the operation.   No blood products were administered during the operation.  The patient received a total of 20 mL of intravenous contrast during the procedure.  Due to elevated LVEDP, 40 mg of Lasix  IV x 1 was administered.  Madelena Maturin K Sue Fernicola MD 04/27/2024 5:24  PM

## 2024-04-27 NOTE — Progress Notes (Signed)
   [address], [place] [address]             [phone]   Sleeping  BP 104/70   Pulse 66   Temp 99 F (37.2 C) (Oral)   Resp (!) 22   Ht 5' 10 (1.778 m)   Wt 71.3 kg   SpO2 95%   BMI 22.55 kg/m  On dobutamine 2.5, norepi @ 4  Intake/Output Summary (Last 24 hours) at 04/27/2024 1802 Last data filed at 04/27/2024 1730 Gross per 24 hour  Intake 424.21 ml  Output 750 ml  Net -325.79 ml   Stable early postop  Elspeth C. Kerrin, MD [facility] [phone]

## 2024-04-27 NOTE — Transfer of Care (Signed)
 Immediate Anesthesia Transfer of Care Note  Patient: [name]  Procedure(s) Performed: Transcatheter Aortic Valve Replacement, Transfemoral (Right) ECHOCARDIOGRAM, TRANSTHORACIC  Patient Location: ICU  Anesthesia Type:MAC  Level of Consciousness: awake, alert , and oriented  Airway & Oxygen Therapy: Patient connected to face mask oxygen  Post-op Assessment: Report given to RN and Post -op Vital signs reviewed and stable  Post vital signs: Reviewed and stable  Last Vitals:  Vitals Value Taken Time  BP 116/65 04/27/24  Temp    Pulse 70 04/27/24  Resp 16 04/27/24  SpO2 95 04/27/24    Last Pain:  Vitals:   04/27/24 1208  TempSrc: Oral  PainSc:       Patients Stated Pain Goal: 0 (04/24/24 0506)  Complications: There were no known notable events for this encounter.

## 2024-04-27 NOTE — Interval H&P Note (Signed)
 History and Physical Interval Note:  04/27/2024 2:20 PM  [name]  has presented today for surgery, with the diagnosis of severe aortic stenosis.  The various methods of treatment have been discussed with the patient and family. After consideration of risks, benefits and other options for treatment, the patient has consented to  Procedure(s): Transcatheter Aortic Valve Replacement, Transfemoral (Right) ECHOCARDIOGRAM, TRANSTHORACIC (N/A) as a surgical intervention.  The patient's history has been reviewed, patient examined, no change in status, stable for surgery.  I have reviewed the patient's chart and labs.  Questions were answered to the patient's satisfaction.     Tranae Laramie K Imanni Burdine

## 2024-04-27 NOTE — Progress Notes (Signed)
 PHARMACY - ANTICOAGULATION CONSULT NOTE  Pharmacy Consult for heparin  infusion Indication: atrial fibrillation  Allergies  Allergen Reactions   No Known Allergies    Patient Measurements: Height: 5' 10 (177.8 cm) Weight: 71.3 kg (157 lb 3 oz) IBW/kg (Calculated) : 73 HEPARIN  DW (KG): 76.7  Vital Signs: Temp: 98.1 F (36.7 C) (10/28 0358) Temp Source: Axillary (10/28 0358) BP: 93/66 (10/28 0700) Pulse Rate: 78 (10/28 0700)  Labs: Recent Labs    04/25/24 0507 04/25/24 2124 04/25/24 2239 04/26/24 0341 04/27/24 0500  HGB 13.8  --   --  14.3 14.0  HCT 41.4  --   --  42.9 40.7  PLT 121*  --   --  141* 157  LABPROT  --   --   --   --  13.4  INR  --   --   --   --  1.0  HEPARINUNFRC 0.37  --   --  0.39 0.47  CREATININE 0.61   < > 0.67 0.68 0.76   < > = values in this interval not displayed.   Estimated Creatinine Clearance: 86.6 mL/min (by C-G formula based on SCr of 0.76 mg/dL).  Medical History: Past Medical History:  Diagnosis Date   Asthma    CAD (coronary artery disease) 01/23/2016   minor CAD at cath-40% RCA   CHF (congestive heart failure) (HCC)    aortic valve replacement   COPD (chronic obstructive pulmonary disease) (HCC)    denies patient said that a pulmonologist said he said that he didnt have COPD   Dyspnea    Dysrhythmia    hx afib   Heart murmur    had a murmur before the aortic valve replacement, no murmur according to dr. Mona per patient   Hypertension    Non-ischemic cardiomyopathy (HCC) 01/23/2016   EF 30-35%   Pneumonia    Severe aortic stenosis    SOBOE (shortness of breath on exertion)     Assessment: Patient is a 68 YO male presenting to the ED with shortness of breath, increased inhaler use needs, and fatigue. Troponins 218 > 259. Cardiology consulted for evaluation of CHF exacerbation and elevated troponins. PMH significant for atrial fibrillation on Xarelto  PTA, and bioprosthetic AVR. Pharmacy consulted to dose heparin  for atrial  fibrillation.   Last dose of Xarelto  on 10/19 at 8pm.  Heparin  level 0.47 is therapeutic with heparin  running at 1700 units/hr. Hgb (14.0) and PLTs (157) are stable. Per RN, no report of pauses, issues with the line, or signs of bleeding.   Patient going for VIV TAVR this afternoon.  Goal of Therapy:  Monitor platelets by anticoagulation protocol: Yes   Plan:  Continue heparin  1700 units/hr Monitor daily heparin  level, CBC, signs and symptoms of bleeding F/u [facility] plans after TAVR procedure today  Thank you for allowing pharmacy to be a part of this patient's care.   Nidia Schaffer, PharmD PGY2 Cardiology Pharmacy Resident  Please check AMION for all [facility] Pharmacy phone numbers After 10:00 PM, call Main Pharmacy [phone] 04/27/2024 7:22 AM

## 2024-04-27 NOTE — Op Note (Addendum)
 HEART AND VASCULAR CENTER   MULTIDISCIPLINARY HEART VALVE TEAM   TAVR OPERATIVE NOTE   Date of Procedure:  04/27/2024  Preoperative Diagnosis: Severe Aortic Stenosis   Postoperative Diagnosis: Same   Procedure:   Transcatheter Aortic Valve Replacement - Percutaneous Transfemoral Approach  Edwards Sapien 3 Ultra Resilia (size 26 mm, model # 9755RSL, serial # [id number])   Co-Surgeons:  Con Clunes, MD and Lurena Red, MD   Anesthesiologist:  Keneth, MD  Echocardiographer:  Emilee, MD  Pre-operative Echo Findings: Severe bioprosthetic aortic stenosis Severe left ventricular systolic dysfunction  Post-operative Echo Findings: No paravalvular leak Unchanged left ventricular systolic function   BRIEF CLINICAL NOTE AND INDICATIONS FOR SURGERY  61M w/ history of bioprosthetic AVR who presented  with acute on chronic systolic CHF and cardiogenic shock.  Has severe bioprosthetic AS with mild-mod AI and reduced EF  DETAILS OF THE OPERATIVE PROCEDURE  PREPARATION:    The patient was brought to the operating room on the above mentioned date and appropriate monitoring was established by the anesthesia team. The patient was placed in the supine position on the operating table.  Intravenous antibiotics were administered. The patient was monitored closely throughout the procedure under conscious sedation.  Baseline transthoracic echocardiogram was performed. The patient's abdomen and both groins were prepped and draped in a sterile manner. A time out procedure was performed.   TRANSFEMORAL ACCESS:   A 6 Fr venous sheath was placed in the right femoral vein for medication administration.  Percutaneous transfemoral access and sheath placement was performed using ultrasound guidance.  The right common femoral artery was cannulated using a micropuncture needle and appropriate location was verified using hand injection angiogram.  A pair of Abbott Perclose percutaneous closure  devices were placed and an 8 French sheath replaced into the femoral artery.  An angiogram with run off of bilateral iliacs and femorals was performed which showed adequate diameter of the vessels.  The patient was heparinized systemically and ACT verified > 250 seconds.    A 14 Fr transfemoral E-sheath was introduced into the right common femoral artery after progressively dilating over an Amplatz superstiff wire. An AL-1 catheter was used to direct a straight-tip stiff Glidewire across the native aortic valve into the left ventricle. This was exchanged out for a pigtail catheter and position was confirmed in the LV apex. The LVEDP was 33 mmHg.  The pigtail catheter was exchanged for a Safari wire in the LV apex.   TRANSCATHETER HEART VALVE DEPLOYMENT:   An Edwards Sapien 3 Ultra transcatheter heart valve (26 mm) was prepared and crimped per manufacturer's guidelines, and the proper orientation of the valve was confirmed on the [employer] delivery system. The valve was advanced through the introducer sheath using normal technique until in an appropriate position in the abdominal aorta beyond the sheath tip. The balloon was then retracted and using the fine-tuning wheel was centered on the valve. The valve was then advanced across the aortic arch using appropriate flexion of the catheter. The valve was carefully positioned across the aortic valve annulus. The Commander catheter was retracted using normal technique. Once final position of the valve was confirmed by angiographic assessment, the valve was deployed during rapid ventricular pacing to maintain systolic blood pressure < 50 mmHg and pulse pressure < 10 mmHg. The balloon inflation was held for >3 seconds after reaching full deployment volume. Once the balloon was fully deflated the balloon was retracted into the ascending aorta and valve function was assessed using echocardiography.  There was felt to be no paravalvular leak and no central aortic  insufficiency.  The patient's hemodynamic recovery following valve deployment was good.  The deployment balloon and guidewire were both removed.    PROCEDURE COMPLETION:   The sheath was removed and femoral artery closure performed.  Protamine  was administered once femoral arterial repair was complete. The femoral venous sheath was removed with manual pressure used for venous hemostasis.    The patient tolerated the procedure well and was transported to the cath lab recovery area in stable condition. There were no immediate intraoperative complications. All sponge instrument and needle counts were verified correct at completion of the operation.   No blood products were administered during the operation.  The patient received a total of 20 mL of intravenous contrast during the procedure.   Con GORMAN Clunes, MD 04/27/2024 5:20 PM

## 2024-04-27 NOTE — Progress Notes (Incomplete)
 [facility]   MULTIDISCIPLINARY HEART VALVE TEAM  Patient Name: [name] Date of Encounter: 04/28/2024  Admit date: 04/19/2024   PCP:  Sabas Norleen PARAS., MD  [facility] Cardiologist:  Vinie JAYSON Maxcy, MD  [facility] Structural heart: Lurena MARLA Red, MD [facility] Electrophysiologist:  None   Hospital Problem List     Principal Problem:   HFrEF (heart failure with reduced ejection fraction) (HCC) Active Problems:   COPD (chronic obstructive pulmonary disease) (HCC)   BPH (benign prostatic hyperplasia)   Atrial fibrillation (HCC)   Chronic systolic (congestive) heart failure (HCC)   Essential hypertension   PAD (peripheral artery disease)   Dyslipidemia   S/P VIV TAVR (transcatheter aortic valve replacement)     Subjective   No complaints. Getting echo.   Inpatient Medications    Scheduled Meds:  aspirin  EC  81 mg Oral Daily   atorvastatin   10 mg Oral Daily   Chlorhexidine  Gluconate Cloth  6 each Topical Daily   finasteride   5 mg Oral QPM   polyethylene glycol  17 g Oral BID   senna  1 tablet Oral BID   sodium chloride  flush  10-40 mL Intracatheter Q12H   sodium chloride  flush  3 mL Intravenous Q12H   sodium chloride  flush  3 mL Intravenous Q12H   tamsulosin   0.4 mg Oral QPC supper   traZODone  50 mg Oral QHS   Continuous Infusions:  sodium chloride      DOBUTamine 2.5 mcg/kg/min (04/28/24 0700)   norepinephrine (LEVOPHED) Adult infusion 2 mcg/min (04/28/24 0700)   PRN Meds: sodium chloride , acetaminophen , albuterol , morphine  injection, ondansetron  (ZOFRAN ) IV, mouth rinse, oxyCODONE , sodium chloride  flush, sodium chloride  flush, sodium chloride  flush   Vital Signs    Vitals:   04/28/24 0700 04/28/24 0715 04/28/24 0730 04/28/24 0812  BP:      Pulse: (!) 58 60 69   Resp: 15 19 (!) 22   Temp:    97.9 F (36.6 C)  TempSrc:    Oral  SpO2: 99% 96% 91%   Weight:      Height:        Intake/Output Summary (Last 24 hours)  at 04/28/2024 0904 Last data filed at 04/28/2024 0700 Gross per 24 hour  Intake 1025.45 ml  Output 2350 ml  Net -1324.55 ml   Filed Weights   04/23/24 0630 04/26/24 0616 04/28/24 0500  Weight: 73.3 kg 71.3 kg 71.9 kg    Physical Exam    GEN: appears older than stated age. Thin HEENT: Grossly normal.  Neck: Supple, no JVD or masses. Cardiac: RRR, no murmurs, rubs, or gallops. No clubbing, cyanosis, edema.   Respiratory: clear GI: Soft, nontender, nondistended, BS + x 4. MS: no deformity or atrophy. Skin: warm and dry, no rash.  Groin sites clear without hematoma or ecchymosis. Neuro:  Strength and sensation are intact. Psych: AAOx3.  Normal affect.  Labs    CBC Recent Labs    04/26/24 0341 04/27/24 0500  WBC 7.1 7.2  HGB 14.3 14.0  HCT 42.9 40.7  MCV 98.8 97.6  PLT 141* 157   Basic Metabolic Panel: Recent Labs  Lab 04/25/24 2239 04/26/24 0341 04/27/24 0500 04/27/24 2013 04/28/24 0504  NA 133* 132* 130* 132* 130*  K 3.6 4.6 3.9 3.7 3.9  CL 96* 94* 95* 93* 96*  CO2 26 26 28 26 25   GLUCOSE 151* 159* 187* 128* 102*  BUN 20 16 22 19 19   CREATININE 0.67 0.68  0.76 0.61 0.69  CALCIUM  8.6* 9.0 8.9 8.9 8.8*  MG 1.9 2.5* 2.0 1.7 1.7   GFR: Estimated Creatinine Clearance: 87.4 mL/min (by C-G formula based on SCr of 0.69 mg/dL). Recent Labs  Lab 04/24/24 0500 04/25/24 0507 04/25/24 2126 04/26/24 0341 04/27/24 0500  WBC 6.8 5.3  --  7.1 7.2  LATICACIDVEN  --   --  1.0  --   --    Liver Function Tests Recent Labs    04/25/24 2124  AST 47*  ALT 46*  ALKPHOS 47  BILITOT 0.6  PROT 6.3*  ALBUMIN  3.2*     Telemetry    Sinus with PVCs, HRs in 60s- Personally Reviewed  ECG    Sinus with LVH and repol abnormalities w/ QRS widening, similar to pre TAVR tracings. HR 65  - Personally Reviewed  Patient Profile     [name] is a 68 y.o. 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 Structural heart consulted for inpatient VIV TAVR.   Assessment & Plan    Bioprosthetic valve failure with severe AS/AI:  -- S/p successful VIV TAVR with a 26 mm Edwards Sapien 3 Ultra Resilia THV via the TF approach on 04/27/24.  -- Post operative echo completed but pending formal read. Personally reviewed, severe LV dysfunction s/p TAVR with trivial PVL ~10 o'clock on PS short axis and increased mean gradient from intra-op echo from 8 to 22 mmhg and moderate MR.  -- Groin sites are stable.  -- ECG stable with no high grade heart block. -- Continue aspirin  for now. Can resume home OAC tonight. Consider changing Xarelto  to Eliquis given reduced bleeding risk. Would stop aspirin   -- Cardiac rehab to see today.   Acute HFrEF with cardiogenic shock: -- Echo this admission with EF 20-25%. Prior echo EF 45-50%.   -- Had been inotrope dependant. Now being weaned down s/p TAVR: currently DBA 2.5 & NE 2.  -- LVEDP 33 at the time of TAVR and given 80mg  IV Lasix  last night, neg 1.3L overnight with net output 10.5L since admission.  -- Weight down 169--> 158 lbs.  -- Will leave diuretic and GDMT management to CHF team.    Spiculated Left Apical Pulmonary Nodule/ Prostatomegaly -- Noted on TAVR CTs -- D/W oncology who felt like prognosis >1 year to live. Brain MRI with no mets.  -- Plan evaluation by PET-CT as outpatient. -- I have placed a referral to the rapid diagnostic oncology clinic who will get him in for evaluation soon after discharge.  -- F/u PSA reassuring, normal at 1.9  mg/ml.  -- I have sent a referral to [facility].   CAD:  -- LHC showed 95% stenosis in large RPLV branch.   -- No chest  pain. Plan medical management.  -- No aspirin  given need for OAC at discharge. -- Continue atorvastatin  10mg  daily ( LDL 42 04/20/24).  PAF:   -- In NSR this admission.  -- Had been treated with IV heparin .  -- Will change him from Xarelto  to Eliquis and stop aspirin     PAD:  -- Previous SFA intervention 2018.   -- Continue atorvastatin  10mg  daily.    COPD with ongoing tobacco abuse:  --  Needs to quit.    Bonney [name], PA-C  04/28/2024, 9:04 AM  Pager [phone]   ATTENDING ATTESTATION:  After conducting a review of all available clinical information with the care team, interviewing the patient, and performing a physical exam, I agree with the findings and plan described in this note.   GEN: No acute distress, AO x 3 HEENT:  MMM, no JVD, no scleral icterus Cardiac: RRR, no murmurs, rubs, or gallops.  Respiratory: Clear to auscultation bilaterally. GI: Soft, nontender, non-distended  MS: No edema; No deformity. Neuro:  Nonfocal  Vasc: Right femoral access site clean dry and intact  Patient is doing well after valve in valve TAVR yesterday due to cardiogenic shock.  Patient's right femoral access site is intact.  He has had no signs or symptoms of stroke.  His echocardiogram today demonstrates a mean gradient of over 20 mmHg.  This is expected given the valve in valve configuration with a 26 mm SAPIEN 3 valve within a 25 mm [employer] valve.  While fracture was considered, the patient's VTC was jeopardized especially with a larger valve implanted after fracture.  For this reason we opted for the 26 S3.  Will change Xarelto  to Eliquis.  Otherwise care per advanced heart failure team.  Lurena Red, MD Pager [phone]

## 2024-04-27 NOTE — Anesthesia Preprocedure Evaluation (Addendum)
 Anesthesia Evaluation  Patient identified by MRN, date of birth, ID band Patient awake    Reviewed: Allergy & Precautions, H&P , NPO status , Patient's Chart, lab work & pertinent test results, reviewed documented beta blocker date and time   Airway Mallampati: II  TM Distance: >3 FB Neck ROM: Full    Dental no notable dental hx. (+) Teeth Intact, Dental Advisory Given   Pulmonary shortness of breath and with exertion, asthma , COPD,  COPD inhaler, Current Smoker   Pulmonary exam normal breath sounds clear to auscultation       Cardiovascular hypertension, Pt. on medications and Pt. on home beta blockers + CAD, + Peripheral Vascular Disease and +CHF  + dysrhythmias + Valvular Problems/Murmurs AI, AS and MR  Rhythm:Regular Rate:Normal + Systolic murmurs    Neuro/Psych negative neurological ROS  negative psych ROS   GI/Hepatic negative GI ROS, Neg liver ROS,,,  Endo/Other  negative endocrine ROS    Renal/GU negative Renal ROS  negative genitourinary   Musculoskeletal   Abdominal   Peds  Hematology negative hematology ROS (+)   Anesthesia Other Findings   Reproductive/Obstetrics negative OB ROS                              Anesthesia Physical Anesthesia Plan  ASA: 4  Anesthesia Plan: MAC   Post-op Pain Management: Precedex    Induction: Intravenous  PONV Risk Score and Plan: 0 and Propofol  infusion  Airway Management Planned: Natural Airway and Simple Face Mask  Additional Equipment: Arterial line  Intra-op Plan:   Post-operative Plan:   Informed Consent: I have reviewed the patients History and Physical, chart, labs and discussed the procedure including the risks, benefits and alternatives for the proposed anesthesia with the patient or authorized representative who has indicated his/her understanding and acceptance.     Dental advisory given  Plan Discussed with:  CRNA  Anesthesia Plan Comments:          Anesthesia Quick Evaluation

## 2024-04-27 NOTE — H&P (View-Only) (Signed)
 Advanced Heart Failure Rounding Note  Cardiologist: Vinie JAYSON Maxcy, MD  AHF: Dr. Cherrie  Chief Complaint: Cardiogenic Shock D/T Severe AoV Stenosis  Patient Profile   68 y/o male with h/o bioprosthetic AVR in 2017, PAF, PAD, COPD and tobacco use, admitted w/ CS in the setting of severe prosthetic aortic valve stenosis.   Admit Echo AoV mean gradient 49 mmHg. LVEF 20-25% (down from 40-45%), RV ok.   Subjective:    Remains in NE 11 and DBA 2.5. Diuresed well yesterday after episode of pulmonary edema out 2.2L   Co-ox 66% SCr stable at 0.8  Breathing better   Objective:    Weight Range: 71.3 kg Body mass index is 22.55 kg/m.   Vital Signs:   Temp:  [97.1 F (36.2 C)-98.6 F (37 C)] 98.6 F (37 C) (10/28 0838) Pulse Rate:  [66-91] 78 (10/28 0700) Resp:  [12-37] 23 (10/28 0700) BP: (81-116)/(52-82) 93/66 (10/28 0700) SpO2:  [90 %-98 %] 96 % (10/28 0700) Last BM Date : 04/25/24  Weight change: Filed Weights   04/19/24 2026 04/23/24 0630 04/26/24 0616  Weight: 73.8 kg 73.3 kg 71.3 kg   Intake/Output:  Intake/Output Summary (Last 24 hours) at 04/27/2024 0846 Last data filed at 04/27/2024 0838 Gross per 24 hour  Intake 707.95 ml  Output 2350 ml  Net -1642.05 ml    Physical Exam   General:  Sitting up in chair. No resp difficulty HEENT: normal Neck: supple. no JVD.  Cor: Regular rate & rhythm. 3/6 AS Lungs: clear Abdomen: soft, nontender, nondistended.Good bowel sounds. Extremities: no cyanosis, clubbing, rash, edema Neuro: alert & orientedx3, cranial nerves grossly intact. moves all 4 extremities w/o difficulty. Affect pleasant   Telemetry   SR 70-80s Personally reviewed  Labs    CBC Recent Labs    04/26/24 0341 04/27/24 0500  WBC 7.1 7.2  HGB 14.3 14.0  HCT 42.9 40.7  MCV 98.8 97.6  PLT 141* 157   Basic Metabolic Panel Recent Labs    89/72/74 0341 04/27/24 0500  NA 132* 130*  K 4.6 3.9  CL 94* 95*  CO2 26 28  GLUCOSE 159* 187*   BUN 16 22  CREATININE 0.68 0.76  CALCIUM  9.0 8.9  MG 2.5* 2.0   Liver Function Tests Recent Labs    04/25/24 2124  AST 47*  ALT 46*  ALKPHOS 47  BILITOT 0.6  PROT 6.3*  ALBUMIN  3.2*   BNP (last 3 results) Recent Labs    03/25/24 1359 04/19/24 1133  BNP 1,332.6* 1,400.2*   Hemoglobin A1C No results for input(s): HGBA1C in the last 72 hours.  Medications:    Scheduled Medications:  aspirin  EC  81 mg Oral Daily   atorvastatin   10 mg Oral Daily   chlorhexidine   1 Application Topical Once   chlorhexidine   15 mL Mouth/Throat Once   Chlorhexidine  Gluconate Cloth  6 each Topical Daily   finasteride   5 mg Oral QPM   magnesium  sulfate  40 mEq Other To OR   polyethylene glycol  17 g Oral BID   potassium chloride   80 mEq Other To OR   senna  1 tablet Oral BID   sodium chloride  flush  10-40 mL Intracatheter Q12H   sodium chloride  flush  3 mL Intravenous Q12H   sodium chloride  flush  3 mL Intravenous Q12H   tamsulosin   0.4 mg Oral QPC supper   traZODone  50 mg Oral QHS    Infusions:   ceFAZolin (ANCEF) IV  dexmedetomidine      DOBUTamine 2.5 mcg/kg/min (04/27/24 0700)   heparin  30,000 units/NS 1000 mL solution for CELLSAVER     heparin  1,700 Units/hr (04/27/24 0700)   norepinephrine (LEVOPHED) Adult infusion 11 mcg/min (04/27/24 0700)   norepinephrine (LEVOPHED) Adult infusion      PRN Medications: acetaminophen , albuterol , ondansetron  (ZOFRAN ) IV, mouth rinse, sodium chloride  flush, sodium chloride  flush, sodium chloride  flush, temazepam   Assessment/Plan   1. Acute on chronic systolic CHF/cardiogenic shock: Echo this admission with EF 20-25%, moderate LV dilation,  RV normal, moderate-severe MR, bioprosthetic aortic valve with severe stenosis and mean gradient 49 with mild-moderate AI, IVC not dilated.  Prior echo had shown EF 45-50%.  In the past, EF dropped with severe AS.  Suspect this is the cause again.  LHC in 2017 showed nonobstructive CAD, LHC this  admission showed 95% stenosis large RPLV branch.  On RHC, RA pressure was low but PCWP elevated.  - DBA restarted for pulm edema and co-ox drop. Continue DBA 2 & NE 11. Co-ox 66% - Volume status improved with diuresis yesterday  - Plan VIV TAVR later today  2. Aortic stenosis: Severe bioprosthetic AS with mild-moderate AI. He initially had AVR for bicuspid aortic valve. Structural heart team has seen for evaluation for valve-in-valve TAVR. Unfortunately, pre-TAVR peripheral vascular scans show severe peripheral artery disease that will likely preclude TF access  - plan for VIV TAVR on Tues 10/28, - R TF +/- Shockwave lithotripsy, not a bailout candidate.  - D/w Structural and TCTS  3. Spiculated Left Apical Pulmonary Nodule/ Prostatomegaly - incidental findings on TAVR protocol CTA - plan evaluation by PET-CT as outpatient -> d/w Oncology - F/u PSA reassuring, normal at 1.9  mg/ml, can follow w/ outpatient urology   4. CAD: No chest pain, minimally elevated troponin with no trend.  Suspect demand ischemia from shock/CHF.  LHC shows 95% stenosis in large RPLV branch.  For the time being, will manage medically.  - ASA 81 mg daily - Continue statin.  - No s/s angina  5. Atrial fibrillation: Paroxysmal.  - Remains in NSR - Continue heparin  gtt  6 PAD: History of peripheral intervention in 2018.    7. Smoking/suspected COPD: Needs to quit.    CRITICAL CARE Performed by: Toribio Fuel  Total critical care time:36 minutes  -Critical care time was exclusive of separately billable procedures and treating other patients. -Critical care was necessary to treat or prevent imminent or life-threatening deterioration. -Critical care was time spent personally by me on the following activities: development of treatment plan with patient and/or surrogate as well as nursing, discussions with consultants, evaluation of patient's response to treatment, examination of patient, obtaining history from  patient or surrogate, ordering and performing treatments and interventions, ordering and review of laboratory studies, ordering and review of radiographic studies, pulse oximetry and re-evaluation of patient's condition.  Length of Stay: 8  Toribio Fuel, MD  04/27/2024, 8:46 AM  Advanced Heart Failure Team Pager [phone] (M-F; 7a - 5p)  Please contact [facility] for night-coverage after hours (5p -7a ) and weekends on [email or web address]

## 2024-04-27 NOTE — Anesthesia Procedure Notes (Signed)
 Arterial Line Insertion Start/End10/28/2025 4:00 PM, 04/27/2024 4:15 PM Performed by: Thukkani, Arun K, MD, Jerl Donald LABOR, CRNA, attending  Patient location: OOR procedure area. Preanesthetic checklist: patient identified, IV checked, site marked, risks and benefits discussed, surgical consent, monitors and equipment checked, pre-op evaluation, timeout performed and anesthesia consent Lidocaine  1% used for infiltration Right, radial was placed Catheter size: 20 G  Attempts: 2 Following insertion, Biopatch and dressing applied. Post procedure assessment: normal  Patient tolerated the procedure well with no immediate complications.

## 2024-04-27 NOTE — Progress Notes (Signed)
 Advanced Heart Failure Rounding Note  Cardiologist: Vinie JAYSON Maxcy, MD  AHF: Dr. Cherrie  Chief Complaint: Cardiogenic Shock D/T Severe AoV Stenosis  Patient Profile   68 y/o French with h/o bioprosthetic AVR in 2017, PAF, PAD, COPD and tobacco use, admitted w/ CS in the setting of severe prosthetic aortic valve stenosis.   Admit Echo AoV mean gradient 49 mmHg. LVEF 20-25% (down from 40-45%), RV ok.   Subjective:    Remains in NE 11 and DBA 2.5. Diuresed well yesterday after episode of pulmonary edema out 2.2L   Co-ox 66% SCr stable at 0.8  Breathing better   Objective:    Weight Range: 71.3 kg Body mass index is 22.55 kg/m.   Vital Signs:   Temp:  [97.1 F (36.2 C)-98.6 F (37 C)] 98.6 F (37 C) (10/28 0838) Pulse Rate:  [66-91] 78 (10/28 0700) Resp:  [12-37] 23 (10/28 0700) BP: (81-116)/(52-82) 93/66 (10/28 0700) SpO2:  [90 %-98 %] 96 % (10/28 0700) Last BM Date : 04/25/24  Weight change: Filed Weights   04/19/24 2026 04/23/24 0630 04/26/24 0616  Weight: 73.8 kg 73.3 kg 71.3 kg   Intake/Output:  Intake/Output Summary (Last 24 hours) at 04/27/2024 0846 Last data filed at 04/27/2024 0838 Gross per 24 hour  Intake 707.95 ml  Output 2350 ml  Net -1642.05 ml    Physical Exam   General:  Sitting up in chair. No resp difficulty HEENT: normal Neck: supple. no JVD.  Cor: Regular rate & rhythm. 3/6 AS Lungs: clear Abdomen: soft, nontender, nondistended.Good bowel sounds. Extremities: no cyanosis, clubbing, rash, edema Neuro: alert & orientedx3, cranial nerves grossly intact. moves all 4 extremities w/o difficulty. Affect pleasant   Telemetry   SR 70-80s Personally reviewed  Labs    CBC Recent Labs    04/26/24 0341 04/27/24 0500  WBC 7.1 7.2  HGB 14.3 14.0  HCT 42.9 40.7  MCV 98.8 97.6  PLT 141* 157   Basic Metabolic Panel Recent Labs    89/72/74 0341 04/27/24 0500  NA 132* 130*  K 4.6 3.9  CL 94* 95*  CO2 26 28  GLUCOSE 159* 187*   BUN 16 22  CREATININE 0.68 0.76  CALCIUM  9.0 8.9  MG 2.5* 2.0   Liver Function Tests Recent Labs    04/25/24 2124  AST Benjamin*  ALT 46*  ALKPHOS Benjamin  BILITOT 0.6  PROT 6.3*  ALBUMIN  3.2*   BNP (last 3 results) Recent Labs    03/25/24 1359 04/19/24 1133  BNP 1,332.6* 1,400.2*   Hemoglobin A1C No results for input(s): HGBA1C in the last 72 hours.  Medications:    Scheduled Medications:  aspirin  EC  81 mg Oral Daily   atorvastatin   10 mg Oral Daily   chlorhexidine   1 Application Topical Once   chlorhexidine   15 mL Mouth/Throat Once   Chlorhexidine  Gluconate Cloth  6 each Topical Daily   finasteride   5 mg Oral QPM   magnesium  sulfate  40 mEq Other To OR   polyethylene glycol  17 g Oral BID   potassium chloride   80 mEq Other To OR   senna  1 tablet Oral BID   sodium chloride  flush  10-40 mL Intracatheter Q12H   sodium chloride  flush  3 mL Intravenous Q12H   sodium chloride  flush  3 mL Intravenous Q12H   tamsulosin   0.4 mg Oral QPC supper   traZODone  50 mg Oral QHS    Infusions:   ceFAZolin (ANCEF) IV  dexmedetomidine      DOBUTamine 2.5 mcg/kg/min (04/27/24 0700)   heparin  30,000 units/NS 1000 mL solution for CELLSAVER     heparin  1,700 Units/hr (04/27/24 0700)   norepinephrine (LEVOPHED) Adult infusion 11 mcg/min (04/27/24 0700)   norepinephrine (LEVOPHED) Adult infusion      PRN Medications: acetaminophen , albuterol , ondansetron  (ZOFRAN ) IV, mouth rinse, sodium chloride  flush, sodium chloride  flush, sodium chloride  flush, temazepam   Assessment/Plan   1. Acute on chronic systolic CHF/cardiogenic shock: Echo this admission with EF 20-25%, moderate LV dilation,  RV normal, moderate-severe MR, bioprosthetic aortic valve with severe stenosis and mean gradient 49 with mild-moderate AI, IVC not dilated.  Prior echo had shown EF 45-50%.  In the past, EF dropped with severe AS.  Suspect this is the cause again.  LHC in 2017 showed nonobstructive CAD, LHC this  admission showed 95% stenosis large RPLV branch.  On RHC, RA pressure was low but PCWP elevated.  - DBA restarted for pulm edema and co-ox drop. Continue DBA 2 & NE 11. Co-ox 66% - Volume status improved with diuresis yesterday  - Plan VIV TAVR later today  2. Aortic stenosis: Severe bioprosthetic AS with mild-moderate AI. He initially had AVR for bicuspid aortic valve. Structural heart team has seen for evaluation for valve-in-valve TAVR. Unfortunately, pre-TAVR peripheral vascular scans show severe peripheral artery disease that will likely preclude TF access  - plan for VIV TAVR on Tues 10/28, - R TF +/- Shockwave lithotripsy, not a bailout candidate.  - D/w Structural and TCTS  3. Spiculated Left Apical Pulmonary Nodule/ Prostatomegaly - incidental findings on TAVR protocol CTA - plan evaluation by PET-CT as outpatient -> d/w Oncology - F/u PSA reassuring, normal at 1.9  mg/ml, can follow w/ outpatient urology   4. CAD: No chest pain, minimally elevated troponin with no trend.  Suspect demand ischemia from shock/CHF.  LHC shows 95% stenosis in large RPLV branch.  For the time being, will manage medically.  - ASA 81 mg daily - Continue statin.  - No s/s angina  5. Atrial fibrillation: Paroxysmal.  - Remains in NSR - Continue heparin  gtt  6 PAD: History of peripheral intervention in 2018.    7. Smoking/suspected COPD: Needs to quit.    CRITICAL CARE Performed by: Toribio Fuel  Total critical care time:36 minutes  -Critical care time was exclusive of separately billable procedures and treating other patients. -Critical care was necessary to treat or prevent imminent or life-threatening deterioration. -Critical care was time spent personally by me on the following activities: development of treatment plan with patient and/or surrogate as well as nursing, discussions with consultants, evaluation of patient's response to treatment, examination of patient, obtaining history from  patient or surrogate, ordering and performing treatments and interventions, ordering and review of laboratory studies, ordering and review of radiographic studies, pulse oximetry and re-evaluation of patient's condition.  Length of Stay: 8  Toribio Fuel, MD  04/27/2024, 8:46 AM  Advanced Heart Failure Team Pager [phone] (M-F; 7a - 5p)  Please contact [facility] for night-coverage after hours (5p -7a ) and weekends on [email or web address]

## 2024-04-27 NOTE — Progress Notes (Signed)
 Brief Progress Note  No issues overnight.  Co-ox improved, creatinine stable.  Feels well.  Vitals:   04/27/24 1439 04/27/24 1444  BP: (!) 188/111 (!) 181/105  Pulse: (!) 54 (!) 0  Resp: 15   Temp:    SpO2: 99% 97%   Plan: Proceed with TAVR today  Con Clunes, MD Cardiothoracic Surgery Pager: [phone]

## 2024-04-28 ENCOUNTER — Inpatient Hospital Stay (HOSPITAL_COMMUNITY)

## 2024-04-28 ENCOUNTER — Telehealth (HOSPITAL_COMMUNITY): Payer: Self-pay

## 2024-04-28 ENCOUNTER — Telehealth: Payer: Self-pay

## 2024-04-28 ENCOUNTER — Telehealth (HOSPITAL_COMMUNITY): Payer: Self-pay | Admitting: Pharmacy Technician

## 2024-04-28 ENCOUNTER — Other Ambulatory Visit (HOSPITAL_COMMUNITY): Payer: Self-pay

## 2024-04-28 ENCOUNTER — Encounter (HOSPITAL_COMMUNITY): Payer: Self-pay | Admitting: Internal Medicine

## 2024-04-28 DIAGNOSIS — Z952 Presence of prosthetic heart valve: Secondary | ICD-10-CM

## 2024-04-28 DIAGNOSIS — I5023 Acute on chronic systolic (congestive) heart failure: Secondary | ICD-10-CM | POA: Diagnosis not present

## 2024-04-28 DIAGNOSIS — R570 Cardiogenic shock: Secondary | ICD-10-CM | POA: Diagnosis not present

## 2024-04-28 DIAGNOSIS — I350 Nonrheumatic aortic (valve) stenosis: Secondary | ICD-10-CM | POA: Diagnosis not present

## 2024-04-28 LAB — BASIC METABOLIC PANEL WITH GFR
Anion gap: 9 (ref 5–15)
BUN: 19 mg/dL (ref 8–23)
CO2: 25 mmol/L (ref 22–32)
Calcium: 8.8 mg/dL — ABNORMAL LOW (ref 8.9–10.3)
Chloride: 96 mmol/L — ABNORMAL LOW (ref 98–111)
Creatinine, Ser: 0.69 mg/dL (ref 0.61–1.24)
GFR, Estimated: >60 mL/min (ref >60)
Glucose, Bld: 102 mg/dL — ABNORMAL HIGH (ref 70–99)
Potassium: 3.9 mmol/L (ref 3.5–5.1)
Sodium: 130 mmol/L — ABNORMAL LOW (ref 135–145)

## 2024-04-28 LAB — GLUCOSE, CAPILLARY
Glucose-Capillary: 109 mg/dL — ABNORMAL HIGH (ref 70–99)
Glucose-Capillary: 108 mg/dL — ABNORMAL HIGH (ref 70–99)
Glucose-Capillary: 115 mg/dL — ABNORMAL HIGH (ref 70–99)
Glucose-Capillary: 116 mg/dL — ABNORMAL HIGH (ref 70–99)
Glucose-Capillary: 81 mg/dL (ref 70–99)

## 2024-04-28 LAB — ECHOCARDIOGRAM COMPLETE
AR max vel: 1.11 cm2
AV Area VTI: 1.04 cm2
AV Area mean vel: 1.1 cm2
AV Mean grad: 22 mmHg
AV Peak grad: 41.2 mmHg
Ao pk vel: 3.21 m/s
Area-P 1/2: 3.23 cm2
Height: 70 in
MV VTI: 1.94 cm2
S' Lateral: 5.4 cm
Weight: 2536.17 [oz_av]

## 2024-04-28 LAB — COOXEMETRY PANEL
Carboxyhemoglobin: 1.6 % — ABNORMAL HIGH (ref 0.5–1.5)
Methemoglobin: <0.7 % (ref 0.0–1.5)
O2 Saturation: 62.5 %
Total hemoglobin: 13.8 g/dL (ref 12.0–16.0)

## 2024-04-28 LAB — CBC
HCT: 39.1 % (ref 39.0–52.0)
Hemoglobin: 13.2 g/dL (ref 13.0–17.0)
MCH: 32.9 pg (ref 26.0–34.0)
MCHC: 33.8 g/dL (ref 30.0–36.0)
MCV: 97.5 fL (ref 80.0–100.0)
Platelets: 152 K/uL (ref 150–400)
RBC: 4.01 MIL/uL — ABNORMAL LOW (ref 4.22–5.81)
RDW: 13.7 % (ref 11.5–15.5)
WBC: 8.4 K/uL (ref 4.0–10.5)
nRBC: 0 % (ref 0.0–0.2)

## 2024-04-28 LAB — MAGNESIUM: Magnesium: 1.7 mg/dL (ref 1.7–2.4)

## 2024-04-28 MED ORDER — ACETAMINOPHEN 325 MG PO TABS
650.0000 mg | ORAL_TABLET | ORAL | Status: DC | PRN
  Administered 2024-04-28 – 2024-05-03 (×5): 650 mg via ORAL
  Filled 2024-04-28 (×4): qty 2

## 2024-04-28 MED ORDER — MAGNESIUM SULFATE 4 GM/100ML IV SOLN
4.0000 g | Freq: Once | INTRAVENOUS | Status: AC
  Administered 2024-04-28: 4 g via INTRAVENOUS
  Filled 2024-04-28: qty 100

## 2024-04-28 MED ORDER — POTASSIUM CHLORIDE CRYS ER 20 MEQ PO TBCR
20.0000 meq | EXTENDED_RELEASE_TABLET | Freq: Once | ORAL | Status: AC
  Administered 2024-04-28: 20 meq via ORAL
  Filled 2024-04-28: qty 1

## 2024-04-28 MED ORDER — APIXABAN 5 MG PO TABS
5.0000 mg | ORAL_TABLET | Freq: Two times a day (BID) | ORAL | Status: DC
  Administered 2024-04-28 – 2024-05-04 (×12): 5 mg via ORAL
  Filled 2024-04-28 (×12): qty 1

## 2024-04-28 NOTE — TOC Progression Note (Signed)
 Transition of Care [facility]) - Progression Note    Patient Details  Name: [name] MRN: [id number] Date of Birth: 1956/01/23  Transition of Care [facility]) CM/SW Contact  Arlana JINNY Nicholaus ISRA[place] Phone Number: [phone] 04/28/2024, 3:51 PM  Clinical Narrative: Chart reviewed for discharge readiness, patient not medically stable for d/c.   HF CSW/CM will continue to follow and monitor for dc readiness.       Expected Discharge Plan: Home w Home Health Services Barriers to Discharge: Continued Medical Work up               Expected Discharge Plan and Services   Discharge Planning Services: CM Consult   Living arrangements for the past 2 months: Single Family Home                                       Social Drivers of Health (SDOH) Interventions SDOH Screenings   Food Insecurity: No Food Insecurity (04/19/2024)  Housing: Low Risk  (04/19/2024)  Transportation Needs: No Transportation Needs (04/19/2024)  Utilities: Not At Risk (04/19/2024)  Social Connections: Socially Isolated (04/19/2024)  Tobacco Use: High Risk (04/23/2024)    Readmission Risk Interventions     No data to display

## 2024-04-28 NOTE — Progress Notes (Addendum)
 Advanced Heart Failure Rounding Note  Cardiologist: Vinie JAYSON Maxcy, MD  AHF: Dr. Cherrie  Chief Complaint: S/p VIV TAVR Patient Profile   68 y/o male with h/o bioprosthetic AVR in 2017, PAF, PAD, COPD and tobacco use, admitted w/ CS in the setting of severe prosthetic aortic valve stenosis. Admit Echo AoV mean gradient 49 mmHg. LVEF 20-25% (down from 40-45%), RV ok.   Subjective:    S/P VIV TAVR 10/28. Post-op echo pending read.  Co-ox 63%. CVP 3 Remains on low dose NE, suspect can wean off as he is HTN. On DBA 2.5.   Sitting in bed, feeling well. No shortness of breath, dizziness or chest pain. Ambulating in room/halls. BM yesterday.   Objective:    Weight Range: 71.9 kg Body mass index is 22.74 kg/m.   Vital Signs:   Temp:  [97.7 F (36.5 C)-99 F (37.2 C)] 97.9 F (36.6 C) (10/29 0812) Pulse Rate:  [0-89] 63 (10/29 0915) Resp:  [11-28] 16 (10/29 0915) BP: (90-199)/(51-115) 93/51 (10/29 0800) SpO2:  [89 %-99 %] 94 % (10/29 0915) Weight:  [71.9 kg] 71.9 kg (10/29 0500) Last BM Date : 04/27/24  Weight change: Filed Weights   04/23/24 0630 04/26/24 0616 04/28/24 0500  Weight: 73.3 kg 71.3 kg 71.9 kg   Intake/Output:  Intake/Output Summary (Last 24 hours) at 04/28/2024 0933 Last data filed at 04/28/2024 0900 Gross per 24 hour  Intake 1034.75 ml  Output 2350 ml  Net -1315.25 ml    Physical Exam   General: Elderly appearing. No distress  Cardiac: JVP flat. S1 and S2 present. No murmurs  Resp: Lung sounds clear and equal B/L Extremities: Warm and dry.  No peripheral edema.  Neuro: Alert and oriented x3. Affect pleasant. Moves all extremities without difficulty.  Telemetry   SR 50-60s (personally reviewed)  Labs    CBC Recent Labs    04/26/24 0341 04/27/24 0500  WBC 7.1 7.2  HGB 14.3 14.0  HCT 42.9 40.7  MCV 98.8 97.6  PLT 141* 157   Basic Metabolic Panel Recent Labs    89/71/74 2013 04/28/24 0504  NA 132* 130*  K 3.7 3.9  CL 93*  96*  CO2 26 25  GLUCOSE 128* 102*  BUN 19 19  CREATININE 0.61 0.69  CALCIUM  8.9 8.8*  MG 1.7 1.7   Liver Function Tests Recent Labs    04/25/24 2124  AST 47*  ALT 46*  ALKPHOS 47  BILITOT 0.6  PROT 6.3*  ALBUMIN  3.2*   BNP (last 3 results) Recent Labs    03/25/24 1359 04/19/24 1133  BNP 1,332.6* 1,400.2*   Hemoglobin A1C No results for input(s): HGBA1C in the last 72 hours.  Medications:    Scheduled Medications:  aspirin  EC  81 mg Oral Daily   atorvastatin   10 mg Oral Daily   Chlorhexidine  Gluconate Cloth  6 each Topical Daily   finasteride   5 mg Oral QPM   polyethylene glycol  17 g Oral BID   potassium chloride   20 mEq Oral Once   senna  1 tablet Oral BID   sodium chloride  flush  10-40 mL Intracatheter Q12H   sodium chloride  flush  3 mL Intravenous Q12H   sodium chloride  flush  3 mL Intravenous Q12H   tamsulosin   0.4 mg Oral QPC supper   traZODone  50 mg Oral QHS    Infusions:  sodium chloride      DOBUTamine 2.5 mcg/kg/min (04/28/24 0900)   magnesium  sulfate bolus IVPB  norepinephrine (LEVOPHED) Adult infusion 2 mcg/min (04/28/24 0900)    PRN Medications: sodium chloride , acetaminophen , albuterol , morphine  injection, ondansetron  (ZOFRAN ) IV, mouth rinse, oxyCODONE , sodium chloride  flush, sodium chloride  flush, sodium chloride  flush  Assessment/Plan   1. Acute on chronic systolic CHF/cardiogenic shock: Echo this admission with EF 20-25%, moderate LV dilation,  RV normal, moderate-severe MR, bioprosthetic aortic valve with severe stenosis and mean gradient 49 with mild-moderate AI, IVC not dilated.  Prior echo had shown EF 45-50%.  In the past, EF dropped with severe AS.  Suspect this is the cause again.  LHC in 2017 showed nonobstructive CAD, LHC this admission showed 95% stenosis large RPLV branch.  On RHC, RA pressure was low but PCWP elevated.  - Intra-op echo with EF 25%, mild/mid MR,  - Co-ox 63% on DBA 2.5. CVP 3 - Hold diuresis today - on NE,  suspect can wean off; goal SBP >100  2. Aortic stenosis: Severe bioprosthetic AS with mild-moderate AI. He initially had AVR for bicuspid aortic valve. Structural heart team has seen for evaluation for valve-in-valve TAVR. Unfortunately, pre-TAVR peripheral vascular scans show severe peripheral artery disease that will likely preclude TF access  - s/p VIV TAVR 04/27/24 - start Eliquis 5 mg tonight - intra-op mean gradient 8 mmHg - repeat echo read pending  3. Spiculated Left Apical Pulmonary Nodule/ Prostatomegaly - incidental findings on TAVR protocol CTA - plan evaluation by PET-CT as outpatient -> d/w Oncology - F/u PSA reassuring, normal at 1.9  mg/ml, can follow w/ outpatient urology   4. CAD: No chest pain, minimally elevated troponin with no trend.  Suspect demand ischemia from shock/CHF.  LHC shows 95% stenosis in large RPLV branch.  For the time being, will manage medically.  - ASA 81 mg daily, can stop with Eliquis - Continue statin.  - No s/s angina  5. Atrial fibrillation: Paroxysmal.  - Remains in NSR - restart Eliquis tonight  6 PAD: History of peripheral intervention in 2018.    7. Smoking/suspected COPD: Needs to quit.    CRITICAL CARE Performed by: Jordan Lee  Total critical care time: 14 minutes  -Critical care time was exclusive of separately billable procedures and treating other patients. -Critical care was necessary to treat or prevent imminent or life-threatening deterioration. -Critical care was time spent personally by me on the following activities: development of treatment plan with patient and/or surrogate as well as nursing, discussions with consultants, evaluation of patient's response to treatment, examination of patient, obtaining history from patient or surrogate, ordering and performing treatments and interventions, ordering and review of laboratory studies, ordering and review of radiographic studies, pulse oximetry and re-evaluation of patient's  condition.  Length of Stay: 43  Jordan Lee, NP  04/28/2024, 9:33 AM  Advanced Heart Failure Team Pager [phone] (M-F; 7a - 5p)  Please contact [facility] for night-coverage after hours (5p -7a ) and weekends on [email or web address]  Agree with above.   S/p successful VIV TAVR yesterday   Feels good. Echo pending. Remains on low-dose NE  General:  Sitting in chair  No resp difficulty HEENT: normal Neck: supple. no JVD.  Cor: Regular rate & rhythm. No rubs, gallops or murmurs. Lungs: clear Abdomen: soft, nontender, nondistended.Good bowel sounds. Extremities: no cyanosis, clubbing, rash, edema Neuro: alert & orientedx3, cranial nerves grossly intact. moves all 4 extremities w/o difficulty. Affect pleasant  Now s/p TAVR. Wean NE as tolerated. Await echo. Switch Xarelto  to Eliquis.   D/w Structural team.   CRITICAL CARE  Performed by: Elivia Robotham  Total critical care time: 37 minutes  Critical care time was exclusive of separately billable procedures and treating other patients.  Critical care was necessary to treat or prevent imminent or life-threatening deterioration.  Critical care was time spent personally by me (independent of midlevel providers or residents) on the following activities: development of treatment plan with patient and/or surrogate as well as nursing, discussions with consultants, evaluation of patient's response to treatment, examination of patient, obtaining history from patient or surrogate, ordering and performing treatments and interventions, ordering and review of laboratory studies, ordering and review of radiographic studies, pulse oximetry and re-evaluation of patient's condition.  Toribio Fuel, MD  11:07 AM

## 2024-04-28 NOTE — Telephone Encounter (Signed)
 Pharmacy Patient Advocate Encounter  Insurance verification completed.    The patient is insured through [facility]. Patient has Medicare and is not eligible for a copay card, but may be able to apply for patient assistance or Medicare RX Payment Plan (Patient Must reach out to their plan, if eligible for payment plan), if available.    Ran test claim for Eliquis 5mg  tablets and the current 30 day co-pay is $0.   This test claim was processed through [employer]- copay amounts may vary at other pharmacies due to [employer], or as the patient moves through the different stages of their insurance plan.

## 2024-04-28 NOTE — Progress Notes (Signed)
 Discussed with pt and family restrictions, exercise, smoking cessation, daily wts, low sodium, and CRPII. Gave HF booklet. Pt receptive. He sts he is done smoking. He watches his sodium already. He is not interested in CRPII. Encouraged pt to walk to help his PAD claudication. Of note, assisted RN with getting pt out of bed. Moved well to recliner.  [phone] Aliene Aris BS, ACSM-CEP 04/28/2024 11:57 AM

## 2024-04-28 NOTE — Plan of Care (Signed)
   Problem: Education: Goal: Knowledge of General Education information will improve Description: Including pain rating scale, medication(s)/side effects and non-pharmacologic comfort measures Outcome: Progressing   Problem: Pain Managment: Goal: General experience of comfort will improve and/or be controlled Outcome: Progressing   Problem: Safety: Goal: Ability to remain free from injury will improve Outcome: Progressing

## 2024-04-28 NOTE — Telephone Encounter (Signed)
 Patient Product/process development scientist completed.    The patient is insured through [facility]. Patient has Medicare and is not eligible for a copay card, but may be able to apply for patient assistance or Medicare RX Payment Plan (Patient Must reach out to their plan, if eligible for payment plan), if available.    Ran test claim for Eliquis 5 mg and the current 30 day co-pay is $0.00.   This test claim was processed through [facility]- copay amounts may vary at other pharmacies due to pharmacy/plan contracts, or as the patient moves through the different stages of their insurance plan.     Reyes Sharps, CPHT Pharmacy Technician Patient Advocate Specialist Lead [facility] Patient Advocate Team Direct Number: [phone]  Fax: [phone]

## 2024-04-28 NOTE — Anesthesia Postprocedure Evaluation (Signed)
 Anesthesia Post Note  Patient: [name]  Procedure(s) Performed: Transcatheter Aortic Valve Replacement, Transfemoral (Right) ECHOCARDIOGRAM, TRANSTHORACIC     Patient location during evaluation: PACU Anesthesia Type: MAC Level of consciousness: awake and alert Pain management: pain level controlled Vital Signs Assessment: post-procedure vital signs reviewed and stable Respiratory status: spontaneous breathing, nonlabored ventilation, respiratory function stable and patient connected to nasal cannula oxygen Cardiovascular status: blood pressure returned to baseline and stable Postop Assessment: no apparent nausea or vomiting Anesthetic complications: no   There were no known notable events for this encounter.  Last Vitals:  Vitals:   04/28/24 0900 04/28/24 0915  BP:    Pulse: (!) 59 63  Resp: (!) 24 16  Temp:    SpO2: 94% 94%    Last Pain:  Vitals:   04/28/24 0900  TempSrc:   PainSc: 0-No pain                 [name]

## 2024-04-28 NOTE — Telephone Encounter (Signed)
 Patient was referred to [facility] for an appointment regarding an abnormal CT of his prostate. Referral, along with CT results and demographic information, was faxed to [phone].

## 2024-04-29 DIAGNOSIS — I350 Nonrheumatic aortic (valve) stenosis: Secondary | ICD-10-CM | POA: Diagnosis not present

## 2024-04-29 DIAGNOSIS — I5023 Acute on chronic systolic (congestive) heart failure: Secondary | ICD-10-CM | POA: Diagnosis not present

## 2024-04-29 DIAGNOSIS — R570 Cardiogenic shock: Secondary | ICD-10-CM | POA: Diagnosis not present

## 2024-04-29 LAB — COOXEMETRY PANEL
Carboxyhemoglobin: 1.7 % — ABNORMAL HIGH (ref 0.5–1.5)
Carboxyhemoglobin: 0.8 % (ref 0.5–1.5)
Methemoglobin: <0.7 % (ref 0.0–1.5)
Methemoglobin: 1.2 % (ref 0.0–1.5)
O2 Saturation: 66.4 %
O2 Saturation: 47.2 %
Total hemoglobin: 12.9 g/dL (ref 12.0–16.0)
Total hemoglobin: 13 g/dL (ref 12.0–16.0)

## 2024-04-29 LAB — GLUCOSE, CAPILLARY
Glucose-Capillary: 108 mg/dL — ABNORMAL HIGH (ref 70–99)
Glucose-Capillary: 93 mg/dL (ref 70–99)
Glucose-Capillary: 96 mg/dL (ref 70–99)
Glucose-Capillary: 133 mg/dL — ABNORMAL HIGH (ref 70–99)

## 2024-04-29 LAB — BASIC METABOLIC PANEL WITH GFR
Anion gap: 9 (ref 5–15)
BUN: 13 mg/dL (ref 8–23)
CO2: 28 mmol/L (ref 22–32)
Calcium: 8.9 mg/dL (ref 8.9–10.3)
Chloride: 93 mmol/L — ABNORMAL LOW (ref 98–111)
Creatinine, Ser: 0.6 mg/dL — ABNORMAL LOW (ref 0.61–1.24)
GFR, Estimated: >60 mL/min (ref >60)
Glucose, Bld: 98 mg/dL (ref 70–99)
Potassium: 3.9 mmol/L (ref 3.5–5.1)
Sodium: 130 mmol/L — ABNORMAL LOW (ref 135–145)

## 2024-04-29 LAB — MAGNESIUM: Magnesium: 1.8 mg/dL (ref 1.7–2.4)

## 2024-04-29 LAB — CBC
HCT: 37.2 % — ABNORMAL LOW (ref 39.0–52.0)
Hemoglobin: 12.5 g/dL — ABNORMAL LOW (ref 13.0–17.0)
MCH: 33.4 pg (ref 26.0–34.0)
MCHC: 33.6 g/dL (ref 30.0–36.0)
MCV: 99.5 fL (ref 80.0–100.0)
Platelets: 144 K/uL — ABNORMAL LOW (ref 150–400)
RBC: 3.74 MIL/uL — ABNORMAL LOW (ref 4.22–5.81)
RDW: 13.9 % (ref 11.5–15.5)
WBC: 5.6 K/uL (ref 4.0–10.5)
nRBC: 0 % (ref 0.0–0.2)

## 2024-04-29 MED ORDER — MAGNESIUM SULFATE 4 GM/100ML IV SOLN
4.0000 g | Freq: Once | INTRAVENOUS | Status: AC
  Administered 2024-04-29: 4 g via INTRAVENOUS
  Filled 2024-04-29: qty 100

## 2024-04-29 MED ORDER — POTASSIUM CHLORIDE CRYS ER 20 MEQ PO TBCR
40.0000 meq | EXTENDED_RELEASE_TABLET | Freq: Once | ORAL | Status: AC
  Administered 2024-04-29: 40 meq via ORAL
  Filled 2024-04-29: qty 2

## 2024-04-29 MED ORDER — DOBUTAMINE-DEXTROSE 4-5 MG/ML-% IV SOLN: 2.5000 ug/kg/min | INTRAVENOUS | Status: DC

## 2024-04-29 MED ORDER — DOBUTAMINE-DEXTROSE 4-5 MG/ML-% IV SOLN
1.0000 ug/kg/min | INTRAVENOUS | Status: DC
Start: 2024-04-29 — End: 2024-05-03
  Administered 2024-04-29: 2.5 ug/kg/min via INTRAVENOUS

## 2024-04-29 MED ORDER — SPIRONOLACTONE 12.5 MG HALF TABLET
12.5000 mg | ORAL_TABLET | Freq: Every day | ORAL | Status: DC
  Administered 2024-04-29: 12.5 mg via ORAL
  Filled 2024-04-29: qty 1

## 2024-04-29 MED ORDER — TRAZODONE HCL 50 MG PO TABS
50.0000 mg | ORAL_TABLET | Freq: Every evening | ORAL | Status: DC | PRN
  Administered 2024-04-30 – 2024-05-03 (×4): 50 mg via ORAL
  Filled 2024-04-29 (×4): qty 1

## 2024-04-29 NOTE — Discharge Instructions (Addendum)
 Information on my medicine - ELIQUIS (apixaban)  This medication education was reviewed with me or my healthcare representative as part of my discharge preparation.  The pharmacist that spoke with me during my hospital stay was:  Carron Brazen, RPH  Why was Eliquis prescribed for you? Eliquis was prescribed for you to reduce the risk of forming blood clots that can cause a stroke if you have a medical condition called atrial fibrillation (a type of irregular heartbeat) OR to reduce the risk of a blood clots forming after orthopedic surgery.  What do You need to know about Eliquis ? Take your Eliquis TWICE DAILY - one tablet in the morning and one tablet in the evening with or without food.  It would be best to take the doses about the same time each day.  If you have difficulty swallowing the tablet whole please discuss with your pharmacist how to take the medication safely.  Take Eliquis exactly as prescribed by your doctor and DO NOT stop taking Eliquis without talking to the doctor who prescribed the medication.  Stopping may increase your risk of developing a new clot or stroke.  Refill your prescription before you run out.  After discharge, you should have regular check-up appointments with your healthcare provider that is prescribing your Eliquis.  In the future your dose may need to be changed if your kidney function or weight changes by a significant amount or as you get older.  What do you do if you miss a dose? If you miss a dose, take it as soon as you remember on the same day and resume taking twice daily.  Do not take more than one dose of ELIQUIS at the same time.  Important Safety Information A possible side effect of Eliquis is bleeding. You should call your healthcare provider right away if you experience any of the following: Bleeding from an injury or your nose that does not stop. Unusual colored urine (red or dark brown) or unusual colored stools (red or  black). Unusual bruising for unknown reasons. A serious fall or if you hit your head (even if there is no bleeding).  Some medicines may interact with Eliquis and might increase your risk of bleeding or clotting while on Eliquis. To help avoid this, consult your healthcare provider or pharmacist prior to using any new prescription or non-prescription medications, including herbals, vitamins, non-steroidal anti-inflammatory drugs (NSAIDs) and supplements.  This website has more information on Eliquis (apixaban): http://www.eliquis.com/eliquis/home

## 2024-04-29 NOTE — Plan of Care (Signed)
   Problem: Activity: Goal: Risk for activity intolerance will decrease Outcome: Progressing   Problem: Nutrition: Goal: Adequate nutrition will be maintained Outcome: Progressing   Problem: Safety: Goal: Ability to remain free from injury will improve Outcome: Progressing

## 2024-04-29 NOTE — TOC Progression Note (Signed)
 Transition of Care [facility]) - Progression Note    Patient Details  Name: ANUSH WIEDEMAN MRN: [id number] Date of Birth: Mar 31, 1956  [facility]) CM/SW Contact  Justina Delcia Czar, RN Phone Number: [phone] 04/29/2024, 4:48 PM  Clinical Narrative:     Patient will transfer to 3E, currently on Dobutamine gtt, anticipate wean off gtt. Pt lives alone, will need PT evaluation and recommendations.   Chart reviewed for discharge readiness, patient not medically stable for d/c. Inpatient CM/CSW will continue to monitor pt's advancement through interdisciplinary progression rounds.   If new pt transition needs arise, MD please place a TOC consult.    Expected Discharge Plan: Home w Home Health Services Barriers to Discharge: Continued Medical Work up    Expected Discharge Plan and Services   Discharge Planning Services: CM Consult   Living arrangements for the past 2 months: Single Family Home                    Social Drivers of Health (SDOH) Interventions SDOH Screenings   Food Insecurity: No Food Insecurity (04/19/2024)  Housing: Low Risk  (04/19/2024)  Transportation Needs: No Transportation Needs (04/19/2024)  Utilities: Not At Risk (04/19/2024)  Social Connections: Socially Isolated (04/19/2024)  Tobacco Use: High Risk (04/23/2024)    Readmission Risk Interventions     No data to display

## 2024-04-29 NOTE — Progress Notes (Addendum)
 Advanced Heart Failure Rounding Note  Cardiologist: Vinie JAYSON Maxcy, MD  AHF: Dr. Cherrie  Chief Complaint: S/p VIV TAVR Patient Profile   68 y/o male with h/o bioprosthetic AVR in 2017, PAF, PAD, COPD and tobacco use, admitted w/ CS in the setting of severe prosthetic aortic valve stenosis. Admit Echo AoV mean gradient 49 mmHg. LVEF 20-25% (down from 40-45%), RV ok.   Subjective:    S/P VIV TAVR 10/28. Post-op echo EF 30%, G2DD, mod MR (improved), mild/mod AI, AoV mean gradient 22 mmHg  Co-ox 66%. CVP 6  Sitting up in bed. Feeling well, no shortness of breath, CP.   Objective:    Weight Range: 72.4 kg Body mass index is 22.9 kg/m.   Vital Signs:   Temp:  [97.8 F (36.6 C)-98.9 F (37.2 C)] 98.4 F (36.9 C) (10/30 0808) Pulse Rate:  [57-78] 70 (10/30 0700) Resp:  [14-29] 17 (10/30 0700) BP: (92-138)/(52-73) 138/59 (10/30 0700) SpO2:  [85 %-98 %] 92 % (10/30 0700) Weight:  [72.4 kg] 72.4 kg (10/30 0451) Last BM Date : 04/27/24  Weight change: Filed Weights   04/26/24 0616 04/28/24 0500 04/29/24 0451  Weight: 71.3 kg 71.9 kg 72.4 kg   Intake/Output:  Intake/Output Summary (Last 24 hours) at 04/29/2024 0903 Last data filed at 04/29/2024 0800 Gross per 24 hour  Intake 74.66 ml  Output 1225 ml  Net -1150.34 ml    Physical Exam   General: Elderly appearing. No distress Cardiac: JVP flat. S1 and S2 present. No murmurs Extremities: Warm and dry.  No peripheral edema.  Neuro: Alert and oriented x3. Affect pleasant. Moves all extremities without difficulty.  Telemetry   SR 50-60s, occasional PVCs (personally reviewed)  Labs    CBC Recent Labs    04/28/24 1126 04/29/24 0445  WBC 8.4 5.6  HGB 13.2 12.5*  HCT 39.1 37.2*  MCV 97.5 99.5  PLT 152 144*   Basic Metabolic Panel Recent Labs    89/70/74 0504 04/29/24 0445 04/29/24 0638  NA 130* 130*  --   K 3.9 3.9  --   CL 96* 93*  --   CO2 25 28  --   GLUCOSE 102* 98  --   BUN 19 13  --    CREATININE 0.69 0.60*  --   CALCIUM  8.8* 8.9  --   MG 1.7  --  1.8   Liver Function Tests No results for input(s): AST, ALT, ALKPHOS, BILITOT, PROT, ALBUMIN  in the last 72 hours.  BNP (last 3 results) Recent Labs    03/25/24 1359 04/19/24 1133  BNP 1,332.6* 1,400.2*   Hemoglobin A1C No results for input(s): HGBA1C in the last 72 hours.  Medications:    Scheduled Medications:  apixaban  5 mg Oral BID   atorvastatin   10 mg Oral Daily   Chlorhexidine  Gluconate Cloth  6 each Topical Daily   finasteride   5 mg Oral QPM   polyethylene glycol  17 g Oral BID   senna  1 tablet Oral BID   sodium chloride  flush  10-40 mL Intracatheter Q12H   sodium chloride  flush  3 mL Intravenous Q12H   sodium chloride  flush  3 mL Intravenous Q12H   tamsulosin   0.4 mg Oral QPC supper   traZODone  50 mg Oral QHS    Infusions:  sodium chloride      DOBUTamine 2.5 mcg/kg/min (04/29/24 0800)   magnesium  sulfate bolus IVPB 4 g (04/29/24 0829)    PRN Medications: sodium chloride , acetaminophen , albuterol ,  morphine  injection, ondansetron  (ZOFRAN ) IV, mouth rinse, oxyCODONE , sodium chloride  flush, sodium chloride  flush, sodium chloride  flush  Assessment/Plan   1. Acute on chronic systolic CHF/cardiogenic shock: Echo this admission with EF 20-25%, moderate LV dilation,  RV normal, moderate-severe MR, bioprosthetic aortic valve with severe stenosis and mean gradient 49 with mild-moderate AI, IVC not dilated.  Prior echo had shown EF 45-50%.  In the past, EF dropped with severe AS.  Suspect this is the cause again.  LHC in 2017 showed nonobstructive CAD, LHC this admission showed 95% stenosis large RPLV branch.  On RHC, RA pressure was low but PCWP elevated.  - Intra-op echo with EF 25%, mild/mid MR - Echo 10/29: EF 30% w mod MR - Co-ox 66%; CVP 6; stop DBA - Start lasix  20 mg daily tomorrow - BP too low for ARB/ARNi - start spiro 12.5 mg daily   2. Aortic stenosis: Severe bioprosthetic  AS with mild-moderate AI. He initially had AVR for bicuspid aortic valve. Structural heart team has seen for evaluation for valve-in-valve TAVR. Unfortunately, pre-TAVR peripheral vascular scans show severe peripheral artery disease that will likely preclude TF access.  - s/p VIV TAVR 04/27/24 - intra-op mean gradient 8 mmHg; formal post-op 22 mmHg with mod AI - continue eliquis 5 mg bid  3. Spiculated Left Apical Pulmonary Nodule/ Prostatomegaly - incidental findings on TAVR protocol CTA - plan evaluation by PET-CT as outpatient -> d/w Oncology - F/u PSA reassuring, normal at 1.9  mg/ml, can follow w/ outpatient urology   4. CAD: No chest pain, minimally elevated troponin with no trend.  Suspect demand ischemia from shock/CHF.  LHC shows 95% stenosis in large RPLV branch.  For the time being, will manage medically.  - continue eliquis, no ASA - Continue statin.  - No s/s angina  5. Atrial fibrillation: Paroxysmal.  - Remains in NSR - continue eliquis  6 PAD: History of peripheral intervention in 2018.    7. Smoking/suspected COPD: Needs to quit.    Likely transfer to floor.  Length of Stay: 10  Jordan Lee, NP  04/29/2024, 9:03 AM  Advanced Heart Failure Team Pager [phone] (M-F; 7a - 5p)  Please contact [facility] for night-coverage after hours (5p -7a ) and weekends on [email or web address]  Patient seen and examined with the above-signed Advanced Practice Provider and/or Housestaff. I personally reviewed laboratory data, imaging studies and relevant notes. I independently examined the patient and formulated the important aspects of the plan. I have edited the note to reflect any of my changes or salient points. I have personally discussed the plan with the patient and/or family.  Feels good.   Echo with EF 30-35% mean gradient across VIV TAVR 22 c/w mod AS (suspect PPM due to VIV)  Failed DBA wean this am with hypotension and low co-ox 47%  General:  Sitting up in chair No resp  difficulty HEENT: normal Neck: supple. no JVD.  Cor: Regular rate & rhythm. 2/6 AS Lungs: clear Abdomen: soft, nontender, nondistended.Good bowel sounds. Extremities: no cyanosis, clubbing, rash, edema Neuro: alert & orientedx3, cranial nerves grossly intact. moves all 4 extremities w/o difficulty. Affect pleasant  Overall improved but still with moderate AS. Will hold diuretics. Restart DBA for now -> wean slowly.   Toribio Fuel, MD  2:29 PM

## 2024-04-30 DIAGNOSIS — R570 Cardiogenic shock: Secondary | ICD-10-CM | POA: Diagnosis not present

## 2024-04-30 DIAGNOSIS — I350 Nonrheumatic aortic (valve) stenosis: Secondary | ICD-10-CM | POA: Diagnosis not present

## 2024-04-30 DIAGNOSIS — I5023 Acute on chronic systolic (congestive) heart failure: Secondary | ICD-10-CM | POA: Diagnosis not present

## 2024-04-30 DIAGNOSIS — I2510 Atherosclerotic heart disease of native coronary artery without angina pectoris: Secondary | ICD-10-CM | POA: Diagnosis not present

## 2024-04-30 LAB — GLUCOSE, CAPILLARY
Glucose-Capillary: 89 mg/dL (ref 70–99)
Glucose-Capillary: 129 mg/dL — ABNORMAL HIGH (ref 70–99)
Glucose-Capillary: 104 mg/dL — ABNORMAL HIGH (ref 70–99)
Glucose-Capillary: 109 mg/dL — ABNORMAL HIGH (ref 70–99)

## 2024-04-30 LAB — CBC
HCT: 36.8 % — ABNORMAL LOW (ref 39.0–52.0)
Hemoglobin: 12.2 g/dL — ABNORMAL LOW (ref 13.0–17.0)
MCH: 33.4 pg (ref 26.0–34.0)
MCHC: 33.2 g/dL (ref 30.0–36.0)
MCV: 100.8 fL — ABNORMAL HIGH (ref 80.0–100.0)
Platelets: 145 K/uL — ABNORMAL LOW (ref 150–400)
RBC: 3.65 MIL/uL — ABNORMAL LOW (ref 4.22–5.81)
RDW: 13.9 % (ref 11.5–15.5)
WBC: 5.6 K/uL (ref 4.0–10.5)
nRBC: 0 % (ref 0.0–0.2)

## 2024-04-30 LAB — BASIC METABOLIC PANEL WITH GFR
Anion gap: 11 (ref 5–15)
BUN: 16 mg/dL (ref 8–23)
CO2: 26 mmol/L (ref 22–32)
Calcium: 9 mg/dL (ref 8.9–10.3)
Chloride: 98 mmol/L (ref 98–111)
Creatinine, Ser: 0.6 mg/dL — ABNORMAL LOW (ref 0.61–1.24)
GFR, Estimated: >60 mL/min (ref >60)
Glucose, Bld: 94 mg/dL (ref 70–99)
Potassium: 4 mmol/L (ref 3.5–5.1)
Sodium: 135 mmol/L (ref 135–145)

## 2024-04-30 LAB — COOXEMETRY PANEL
Carboxyhemoglobin: 2.6 % — ABNORMAL HIGH (ref 0.5–1.5)
Methemoglobin: <0.7 % (ref 0.0–1.5)
O2 Saturation: 78.5 %
Total hemoglobin: 12.8 g/dL (ref 12.0–16.0)

## 2024-04-30 LAB — MAGNESIUM: Magnesium: 1.9 mg/dL (ref 1.7–2.4)

## 2024-04-30 MED ORDER — TORSEMIDE 20 MG PO TABS: 20.0000 mg | ORAL_TABLET | Freq: Every day | ORAL | Status: DC

## 2024-04-30 MED ORDER — TORSEMIDE 20 MG PO TABS
20.0000 mg | ORAL_TABLET | Freq: Every day | ORAL | Status: DC
  Administered 2024-05-01 – 2024-05-04 (×4): 20 mg via ORAL
  Filled 2024-04-30 (×4): qty 1

## 2024-04-30 NOTE — Plan of Care (Signed)
  Problem: Education: Goal: Understanding of cardiac disease, CV risk reduction, and recovery process will improve Outcome: Progressing   Problem: Activity: Goal: Ability to tolerate increased activity will improve Outcome: Progressing   Problem: Cardiac: Goal: Ability to achieve and maintain adequate cardiovascular perfusion will improve Outcome: Progressing   Problem: Education: Goal: Knowledge of General Education information will improve Description: Including pain rating scale, medication(s)/side effects and non-pharmacologic comfort measures Outcome: Progressing   Problem: Health Behavior/Discharge Planning: Goal: Ability to manage health-related needs will improve Outcome: Progressing   Problem: Clinical Measurements: Goal: Respiratory complications will improve Outcome: Progressing   Problem: Activity: Goal: Risk for activity intolerance will decrease Outcome: Progressing   Problem: Nutrition: Goal: Adequate nutrition will be maintained Outcome: Progressing   Problem: Coping: Goal: Level of anxiety will decrease Outcome: Progressing   Problem: Pain Managment: Goal: General experience of comfort will improve and/or be controlled Outcome: Progressing   Problem: Safety: Goal: Ability to remain free from injury will improve Outcome: Progressing   Problem: Skin Integrity: Goal: Risk for impaired skin integrity will decrease Outcome: Progressing

## 2024-04-30 NOTE — Discharge Summary (Incomplete)
 Advanced Heart Failure Team  Discharge Summary   Patient ID: [name] MRN: [id number], DOB/AGE: 70-Jun-1957 70 y.o. 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 Admit Echo AoV mean gradient 49 mmHg. LVEF 20-25% (down from 40-45%), RV ok. He required dual inotropic/pressor support w/ DBA and NE to assist w/ diuresis. R/LHC on 10/21 showed 95% stenosis in a large PLV vessel (treated medically), low RA pressure but elevated PCWP and mild pulmonary venous hypertension. Cardiac output adequate on dobutamine + norepinephrine (RA 1, PAP 49/22, PCWP 23, TD CI 2.67, FICK CI 2.64, PAPi 6.75). Both CT surgery and structural heart teams were consulted for evaluation for redo valve replacement. He was felt to be at high risk for conventional surgical aortic valve replacement. Subsequent Pre-TAVR scans showed incidental finding of spiculated left apical pulmonary nodule concerning for bronchogenic carcinoma as well as prostatomegaly. F/u PSA was reassuring, normal at 1.9 mg/ml. Case was d/w oncology. They recommended getting brain MRI to r/o brain mets. There was no evidence of metastatic disease. Oncology felt prognosis felt to be > 69yr with lung CA given no evidence of distant metastasis. Decision made to proceed w/ AVR. Per CT surgery and structural heart team discussion, TAVR felt most suitable. He underwent VIV TAVR 10/28. Post-op echo EF 30%, G2DD, mod MR (improved), mild/mod AI, AoV mean gradient 22  mmHg.  Post TAVR, he was weaned off NE but he failed initial DBA wean and was restarted on 0.25 mcg/kg/min. DBA slowly weaned down again a second time with success.   Feels good. Ambulating halls. Pt seen today and deemed appropriate for discharge by Dr. Cherrie.   Hospital f/u has been arranged in both the [facility] and [facility].   Post d/c, he will need outpatient f/u w/ oncology. Referral has been placed.    Discharge Vitals: Blood pressure (!) 106/53, pulse (!) 58, temperature 97.8 F (36.6 C), temperature source Oral, resp. rate 17, height 5' 10 (1.778 m), weight 70 kg, SpO2 94%. General:  Sitting up in chair. No resp difficulty HEENT: normal Neck: supple. no JVD.  Cor: Regular rate & rhythm. Soft SEM at RSB Lungs: clear Abdomen: soft, nontender, nondistended.Good bowel sounds. Extremities: no cyanosis, clubbing, rash, edema Neuro: alert & orientedx3, cranial nerves grossly intact. moves all 4 extremities w/o difficulty. Affect pleasant   Labs: Lab Results  Component Value Date   WBC 6.8 05/02/2024   HGB 12.3 (L) 05/02/2024   HCT 36.7 (L) 05/02/2024   MCV 99.2 05/02/2024   PLT 157 05/02/2024    Recent Labs  Lab 05/03/24 0500  NA 137  K 4.0  CL 100  CO2 27  BUN 21  CREATININE 0.70  CALCIUM  9.0  GLUCOSE 92   Lab Results  Component Value Date   CHOL 108 04/20/2024   HDL 52 04/20/2024   LDLCALC 42 04/20/2024   TRIG 68 04/20/2024   BNP (last 3 results) Recent Labs    03/25/24 1359 04/19/24 1133  BNP 1,332.6* 1,400.2*  ProBNP (last 3 results) No results for input(s): PROBNP in the last 8760 hours.   Diagnostic Studies/Procedures   2D Echo 04/20/24 1. Left ventricular ejection fraction, by estimation, is 20 to 25%. The  left ventricle has severely decreased function. The left ventricle  demonstrates regional wall motion abnormalities (see scoring  diagram/findings for description). The left  ventricular internal cavity size was  moderately to severely dilated. There  is mild concentric left ventricular hypertrophy. Left ventricular  diastolic parameters are indeterminate.   2. Right ventricular systolic function is normal. The right ventricular  size is normal. Tricuspid regurgitation signal is inadequate for assessing  PA pressure.   3. Left atrial size was severely dilated.   4. Right atrial size was mild to moderately dilated.   5. The mitral valve is degenerative. Moderate to severe mitral valve  regurgitation. No evidence of mitral stenosis.   6. 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  Details below.   7. The aortic valve was not well visualized. Aortic valve regurgitation  is mild to moderate. There is a 25 mm Edwards valve present in the aortic  position. Procedure Date: 2017. Echo findings are consistent with stenosis  of the aortic prosthesis. Aortic   valve area, by VTI measures 0.61 cm. Aortic valve mean gradient measures  49.0 mmHg. Aortic valve Vmax measures 4.72 m/s. Aortic valve acceleration  time measures 169 msec.   8. Aortic dilatation noted. There is borderline dilatation of the aortic  root, measuring 37 mm.   9. The inferior vena cava is normal in size with greater than 50%  respiratory variability, suggesting right atrial pressure of 3 mmHg.  10. Cannot exclude a small PFO.    R/LHC  Right Heart Pressures RHC Procedural Findings (dobutamine 2.5 + NE 10): Hemodynamics (mmHg) RA 4 RV 38/9 PA 49/22, mean 32 PCWP mean 23  Oxygen saturations: PA 63% AO 88%  Cardiac Output (Fick) 4.42  Cardiac Index (Fick) 2.64  PVR 2 WU  Cardiac Output (Thermo) 4.47  Cardiac Index (Thermo) 2.67  PVR 2 WU  PAPi 6.75    VIV TAVR 04/27/24 (see op note for full details)   Discharge Medications   Allergies as of 05/04/2024       Reactions   No Known Allergies         Medication List     STOP taking these medications    carvedilol  3.125 MG tablet Commonly  known as: COREG    levofloxacin 500 MG tablet Commonly known as: LEVAQUIN   losartan  100 MG tablet Commonly known as: COZAAR    naproxen sodium 220 MG tablet Commonly known as: ALEVE   Xarelto  20 MG Tabs tablet Generic drug: rivaroxaban        TAKE these medications    acetaminophen  500 MG tablet Commonly known as: TYLENOL  Take 1,000 mg by mouth daily as needed for headache.   albuterol  108 (90 Base) MCG/ACT inhaler Commonly known as: VENTOLIN  HFA Inhale 2 puffs into the lungs every 4 (four) hours as needed for wheezing or shortness of breath.   apixaban 5 MG Tabs tablet Commonly known as: ELIQUIS Take 1 tablet (5 mg total) by mouth 2 (two) times daily.   atorvastatin  10 MG tablet Commonly known as: LIPITOR TAKE 1 TABLET BY MOUTH EVERY DAY   empagliflozin  10 MG Tabs tablet Commonly known as: Jardiance  Take 1 tablet (10 mg total) by mouth daily before breakfast.   finasteride  5 MG tablet Commonly known as: PROSCAR  Take 1 tablet (  5 mg total) by mouth daily. What changed: when to take this   spironolactone 25 MG tablet Commonly known as: ALDACTONE Take 1 tablet (25 mg total) by mouth daily. Start taking on: May 05, 2024   tamsulosin  0.4 MG Caps capsule Commonly known as: FLOMAX  Take 0.4 mg by mouth every evening.   torsemide 20 MG tablet Commonly known as: DEMADEX Take 1 tablet (20 mg total) by mouth daily. Start taking on: May 05, 2024        Disposition   The patient will be discharged in stable condition to home. Discharge Instructions     (HEART FAILURE PATIENTS) Call MD:  Anytime you have any of the following symptoms: 1) 3 pound weight gain in 24 hours or 5 pounds in 1 week 2) shortness of breath, with or without a dry hacking cough 3) swelling in the hands, feet or stomach 4) if you have to sleep on extra pillows at night in order to breathe.   Complete by: As directed    Diet - low sodium heart healthy   Complete by: As directed     Heart Failure patients record your daily weight using the same scale at the same time of day   Complete by: As directed    Increase activity slowly   Complete by: As directed        Follow-up Information     [facility] Follow up.   Specialty: Cardiology Why: 05/14/24 at 2:30 PM  Hospital Follow up in the Advanced Heart Failure Clinic at [facility], Wynetta BROCKS [facility] Parking Available) Contact information: [address] [place]  [id number] [phone]        Sebastian Lamarr SAUNDERS, PA-C Follow up.   Specialties: Cardiology, Radiology Why: 05/24/24 at 11:10 AM   [facility] Follow Up Contact information: [address] [place] [address] [phone]         Sabas Norleen PARAS., MD. Go in 8 day(s).   Specialty: Family Medicine Why: Hospital follow up appointment scheduled for Tuesday, May 11, 2024 at 2:00 PM.  PLEASE ARRIVE 10-15 minutes early.  PLEASE call to cancel/reschedule if you CANNOT make appointment. Contact information: [address] [place] [address] [phone]                  APP Time to complete D/C Summary: 15 minutes.  Greig Mosses, NP  10:58 AM  Patient seen and examined with the above-signed Advanced Practice Provider and/or Housestaff. I personally reviewed laboratory data, imaging studies and relevant notes. I independently examined the patient and formulated the important aspects of the plan. I have edited the note to reflect any of my changes or salient points. I have personally discussed the plan with the patient and/or family.   He is doing well. No CP or SOB. Co-ox stable off dobutamine. Volume ok. Ambulated the halls without diffculty  General:  Sitting up in chair. No resp difficulty HEENT: normal Neck: supple. no JVD.  Cor: Regular rate & rhythm. 2/6 SEM at RSB Lungs: clear Abdomen: soft, nontender, nondistended.Good  bowel sounds. Extremities: no cyanosis, clubbing, rash, edema Neuro: alert & orientedx3, cranial nerves grossly intact. moves all 4 extremities w/o difficulty. Affect pleasant  He is stable off DBA. Ok for d/c today. Meds reviewed with pharmD team.   Will pull PICC  MD d/c time 36 mins  Toribio Fuel, MD  2:24 PM

## 2024-04-30 NOTE — Plan of Care (Signed)
  Problem: Activity: Goal: Ability to tolerate increased activity will improve Outcome: Progressing   Problem: Clinical Measurements: Goal: Diagnostic test results will improve Outcome: Progressing Goal: Respiratory complications will improve Outcome: Progressing   Problem: Activity: Goal: Risk for activity intolerance will decrease Outcome: Progressing   Problem: Coping: Goal: Level of anxiety will decrease Outcome: Progressing   Problem: Pain Managment: Goal: General experience of comfort will improve and/or be controlled Outcome: Progressing   Problem: Safety: Goal: Ability to remain free from injury will improve Outcome: Progressing

## 2024-04-30 NOTE — Care Management Important Message (Signed)
 Important Message  Patient Details  Name: [name] MRN: [id number] Date of Birth: 02/20/1956   Important Message Given:        [name] 04/30/2024, 1:34 PM

## 2024-04-30 NOTE — Progress Notes (Addendum)
 Advanced Heart Failure Rounding Note  Cardiologist: Vinie JAYSON Maxcy, MD  AHF: Dr. Cherrie  Chief Complaint: S/p VIV TAVR Patient Profile   68 y/o male with h/o bioprosthetic AVR in 2017, PAF, PAD, COPD and tobacco use, admitted w/ CS in the setting of severe prosthetic aortic valve stenosis. Admit Echo AoV mean gradient 49 mmHg. LVEF 20-25% (down from 40-45%), RV ok.   Subjective:    S/P VIV TAVR 10/28. Post-op echo EF 30%, G2DD, mod MR (improved), mild/mod AI, AoV mean gradient 22 mmHg  Failed DBA wean yesterday>>restarted at 2.5 mcg/kg/min. Co-ox 47>>79% today   Wt stable, CVP 3 SBPs mid 90s   Overall feels much better. Breathing significantly improved. No dyspnea or CP    Objective:    Weight Range: 72.6 kg Body mass index is 22.97 kg/m.   Vital Signs:   Temp:  [97.7 F (36.5 C)-98.3 F (36.8 C)] 97.8 F (36.6 C) (10/31 0747) Pulse Rate:  [56-69] 68 (10/31 0747) Resp:  [16-30] 19 (10/31 0747) BP: (81-113)/(52-77) 95/56 (10/31 0747) SpO2:  [90 %-97 %] 91 % (10/31 0747) Weight:  [72.6 kg] 72.6 kg (10/31 0025) Last BM Date : 04/29/24  Weight change: Filed Weights   04/28/24 0500 04/29/24 0451 04/30/24 0025  Weight: 71.9 kg 72.4 kg 72.6 kg   Intake/Output:  Intake/Output Summary (Last 24 hours) at 04/30/2024 0933 Last data filed at 04/30/2024 0700 Gross per 24 hour  Intake 1023.55 ml  Output 1275 ml  Net -251.45 ml    Physical Exam   CVP 3  General: well appearing, NAD  Cardiac: JVP not elevated. RRR. 2/6 AS murmur  Extremities: warm and dry, No LEE. + RUE PICC  Neuro: A&Ox3. Moves all 4 ext w/o difficulty. Affect pleasant   Telemetry   NSR 60s, occasional PVCs (personally reviewed)  Labs    CBC Recent Labs    04/29/24 0445 04/30/24 0523  WBC 5.6 5.6  HGB 12.5* 12.2*  HCT 37.2* 36.8*  MCV 99.5 100.8*  PLT 144* 145*   Basic Metabolic Panel Recent Labs    89/69/74 0445 04/29/24 0638 04/30/24 0523  NA 130*  --  135  K 3.9  --   4.0  CL 93*  --  98  CO2 28  --  26  GLUCOSE 98  --  94  BUN 13  --  16  CREATININE 0.60*  --  0.60*  CALCIUM  8.9  --  9.0  MG  --  1.8 1.9   Liver Function Tests No results for input(s): AST, ALT, ALKPHOS, BILITOT, PROT, ALBUMIN  in the last 72 hours.  BNP (last 3 results) Recent Labs    03/25/24 1359 04/19/24 1133  BNP 1,332.6* 1,400.2*   Hemoglobin A1C No results for input(s): HGBA1C in the last 72 hours.  Medications:    Scheduled Medications:  apixaban  5 mg Oral BID   atorvastatin   10 mg Oral Daily   Chlorhexidine  Gluconate Cloth  6 each Topical Daily   finasteride   5 mg Oral QPM   polyethylene glycol  17 g Oral BID   senna  1 tablet Oral BID   sodium chloride  flush  10-40 mL Intracatheter Q12H   sodium chloride  flush  3 mL Intravenous Q12H   sodium chloride  flush  3 mL Intravenous Q12H   tamsulosin   0.4 mg Oral QPC supper    Infusions:  sodium chloride      DOBUTamine 2.5 mcg/kg/min (04/29/24 2259)    PRN Medications: sodium chloride ,  acetaminophen , albuterol , ondansetron  (ZOFRAN ) IV, mouth rinse, oxyCODONE , sodium chloride  flush, sodium chloride  flush, sodium chloride  flush, traZODone  Assessment/Plan   1. Acute on chronic systolic CHF/cardiogenic shock: Echo this admission with EF 20-25%, moderate LV dilation,  RV normal, moderate-severe MR, bioprosthetic aortic valve with severe stenosis and mean gradient 49 with mild-moderate AI, IVC not dilated.  Prior echo had shown EF 45-50%.  In the past, EF dropped with severe AS.  Suspect this is the cause again.  LHC in 2017 showed nonobstructive CAD, LHC this admission showed 95% stenosis large RPLV branch.  On RHC, RA pressure was low but PCWP elevated.  - Intra-op echo with EF 25%, mild/mid MR - Echo 10/29: EF 30% w mod MR - Failed DBA wean 10/30, restarted @ 2.5  - Co-ox 79% today, CVP 3 -will restart gentle diuresis tomorrow with torsemide 20 (had acute pulmonary edema a few days ago) - BP too  low for ARB/ARNi - can consider adding spiro as tolerated  - no ? blocker w/ low output  - plan slow DBA wean, can wean down to 1 mcg later this evening, off tomorrow if stable   2. Aortic stenosis: Severe bioprosthetic AS with mild-moderate AI. He initially had AVR for bicuspid aortic valve. Structural heart team has seen for evaluation for valve-in-valve TAVR. Unfortunately, pre-TAVR peripheral vascular scans show severe peripheral artery disease that will likely preclude TF access.  - s/p VIV TAVR 04/27/24 - intra-op mean gradient 8 mmHg; formal post-op 22 mmHg with mod AI - continue eliquis 5 mg bid  3. Spiculated Left Apical Pulmonary Nodule/ Prostatomegaly - incidental findings on TAVR protocol CTA - plan evaluation by PET-CT as outpatient -> d/w Oncology - F/u PSA reassuring, normal at 1.9  mg/ml, can follow w/ outpatient urology   4. CAD: No chest pain, minimally elevated troponin with no trend.  Suspect demand ischemia from shock/CHF.  LHC shows 95% stenosis in large RPLV branch.  For the time being, will manage medically.  - continue eliquis, no ASA - Continue statin.  - No s/s angina  5. Atrial fibrillation: Paroxysmal.  - Remains in NSR - continue eliquis  6 PAD: History of peripheral intervention in 2018.    7. Smoking/suspected COPD: Needs to quit.    Ambulate today. Will place PT/OT consult.    Length of Stay: [address], PA-C  04/30/2024, 9:33 AM  Advanced Heart Failure Team Pager [phone] (M-F; 7a - 5p)  Please contact [facility] for night-coverage after hours (5p -7a ) and weekends on [email or web address]  Patient seen and examined with the above-signed Advanced Practice Provider and/or Housestaff. I personally reviewed laboratory data, imaging studies and relevant notes. I independently examined the patient and formulated the important aspects of the plan. I have edited the note to reflect any of my changes or salient points. I have personally discussed  the plan with the patient and/or family.  Remains on DBA 2.5 (failed wean earlier in the week) Feels good. Walking unit. No CP or SOB. BP soft  General:  Sitting up in chair No resp difficulty HEENT: normal Neck: supple. no JVD.  Cor: Regular rate & rhythm. No rubs, gallops or murmurs. Lungs: clear but decreased Abdomen: soft, nontender, nondistended.Good bowel sounds. Extremities: no cyanosis, clubbing, rash, edema Neuro: alert & orientedx3, cranial nerves grossly intact. moves all 4 extremities w/o difficulty. Affect pleasant  Reattempts DBA wean. Drop 2.5 -> 1. Follow coox and BP. Will hold diuretics again today but suspect we  will need to restart low-dose torsemide tomorrow as he developed acute pulmonary edema several days ago (albeit this was before TAVR),   Toribio Fuel, MD  12:16 PM

## 2024-05-01 DIAGNOSIS — I5020 Unspecified systolic (congestive) heart failure: Secondary | ICD-10-CM | POA: Diagnosis not present

## 2024-05-01 LAB — BASIC METABOLIC PANEL WITH GFR
Anion gap: 8 (ref 5–15)
BUN: 14 mg/dL (ref 8–23)
CO2: 26 mmol/L (ref 22–32)
Calcium: 8.7 mg/dL — ABNORMAL LOW (ref 8.9–10.3)
Chloride: 101 mmol/L (ref 98–111)
Creatinine, Ser: 0.65 mg/dL (ref 0.61–1.24)
GFR, Estimated: >60 mL/min (ref >60)
Glucose, Bld: 85 mg/dL (ref 70–99)
Potassium: 3.9 mmol/L (ref 3.5–5.1)
Sodium: 135 mmol/L (ref 135–145)

## 2024-05-01 LAB — CBC
HCT: 37.3 % — ABNORMAL LOW (ref 39.0–52.0)
Hemoglobin: 12.4 g/dL — ABNORMAL LOW (ref 13.0–17.0)
MCH: 33 pg (ref 26.0–34.0)
MCHC: 33.2 g/dL (ref 30.0–36.0)
MCV: 99.2 fL (ref 80.0–100.0)
Platelets: 143 K/uL — ABNORMAL LOW (ref 150–400)
RBC: 3.76 MIL/uL — ABNORMAL LOW (ref 4.22–5.81)
RDW: 13.6 % (ref 11.5–15.5)
WBC: 5.7 K/uL (ref 4.0–10.5)
nRBC: 0 % (ref 0.0–0.2)

## 2024-05-01 LAB — GLUCOSE, CAPILLARY
Glucose-Capillary: 95 mg/dL (ref 70–99)
Glucose-Capillary: 81 mg/dL (ref 70–99)
Glucose-Capillary: 122 mg/dL — ABNORMAL HIGH (ref 70–99)
Glucose-Capillary: 138 mg/dL — ABNORMAL HIGH (ref 70–99)

## 2024-05-01 LAB — COOXEMETRY PANEL
Carboxyhemoglobin: 1.8 % — ABNORMAL HIGH (ref 0.5–1.5)
Methemoglobin: <0.7 % (ref 0.0–1.5)
O2 Saturation: 64.8 %
Total hemoglobin: 12.6 g/dL (ref 12.0–16.0)

## 2024-05-01 LAB — MAGNESIUM: Magnesium: 1.6 mg/dL — ABNORMAL LOW (ref 1.7–2.4)

## 2024-05-01 MED ORDER — TORSEMIDE 20 MG PO TABS: 20.0000 mg | ORAL_TABLET | Freq: Every day | ORAL | Status: DC

## 2024-05-01 MED ORDER — MAGNESIUM SULFATE 4 GM/100ML IV SOLN
4.0000 g | Freq: Once | INTRAVENOUS | Status: AC
  Administered 2024-05-01: 4 g via INTRAVENOUS
  Filled 2024-05-01: qty 100

## 2024-05-01 MED ORDER — POTASSIUM CHLORIDE CRYS ER 20 MEQ PO TBCR
20.0000 meq | EXTENDED_RELEASE_TABLET | Freq: Once | ORAL | Status: AC
  Administered 2024-05-01: 20 meq via ORAL
  Filled 2024-05-01: qty 1

## 2024-05-01 NOTE — Plan of Care (Signed)
  Problem: Education: Goal: Understanding of cardiac disease, CV risk reduction, and recovery process will improve Outcome: Progressing   Problem: Activity: Goal: Ability to tolerate increased activity will improve Outcome: Progressing   Problem: Health Behavior/Discharge Planning: Goal: Ability to safely manage health-related needs after discharge will improve Outcome: Progressing

## 2024-05-01 NOTE — Progress Notes (Signed)
  Progress Note  Patient Name: [name] Date of Encounter: 05/01/2024 [facility] Cardiologist: Vinie JAYSON Maxcy, MD   Interval Summary   Seems to be in good spirits, no significant shortness of breath, ambulating well.  Eager to go home soon.  No chest pain.  Vital Signs Vitals:   04/30/24 2324 05/01/24 0253 05/01/24 0540 05/01/24 0754  BP: (!) 108/55 (!) 102/58    Pulse: (!) 59 (!) 53  76  Resp: 20 17  19   Temp: 98 F (36.7 C) 97.7 F (36.5 C)  98.3 F (36.8 C)  TempSrc: Oral Oral  Oral  SpO2: 91% 94%  96%  Weight:   70.7 kg   Height:        Intake/Output Summary (Last 24 hours) at 05/01/2024 1001 Last data filed at 05/01/2024 0755 Gross per 24 hour  Intake 639.19 ml  Output 2175 ml  Net -1535.81 ml      05/01/2024    5:40 AM 04/30/2024   12:25 AM 04/29/2024    4:51 AM  Last 3 Weights  Weight (lbs) 155 lb 13.8 oz 160 lb 0.9 oz 159 lb 9.8 oz  Weight (kg) 70.7 kg 72.6 kg 72.4 kg      Telemetry/ECG  No evidence arrhythmias- Personally Reviewed  Physical Exam  GEN: No acute distress.   Neck: No JVD Cardiac: RRR, no murmurs, rubs, or gallops.  Respiratory: Mild crackles left base. GI: Soft, nontender, non-distended  MS: No edema  Assessment & Plan   Acute on chronic systolic heart failure/cardiogenic shock - EF 20 to 25% on this admission with moderate LV dilatation RV appears normal severe mitral regurgitation -Diuretics have been on hold, BP too low for Entresto -Thought was to add back torsemide low-dose as he developed acute pulmonary edema several days ago albeit this was before his TAVR. -Remains on dobutamine 2.5 failed wean earlier this week.  Has been walking around the unit.  Co. ox previously 79%, today 64.8.  Continuing IV dobutamine.  Creatinine 0.65  Severe aortic stenosis status post TAVR valve in valve - Occurred on 04/27/2024.  IntraOp mean gradient was 8 mmHg, formal postop echo was 22 mmHg with moderate AI. -Continuing  Eliquis 5 mg twice a day  Spiculated left apical pulmonary nodule/prostate enlargement - Incidental findings noted on CT prior to TAVR, plan is to evaluate with PET/CT as outpatient discussed with oncology PSA is normal 1.9 followed with outpatient urology.  Coronary artery disease - Minimally elevated troponin suspect demand ischemia from shock CHF.  Left heart catheterization showed 95% stenosis and a large posterior lateral branch.  Treating medically.  - Continue with statin, Eliquis, no aspirin   Paroxysmal atrial fibrillation - Continues with sinus rhythm - Continue with Eliquis  Peripheral arterial disease - Prior peripheral intervention 2018  Smoking, suspected COPD - Continue to encourage cessation.      For questions or updates, please contact [facility] Please consult [email or web address] for contact info under         Signed, Oneil Parchment, MD

## 2024-05-01 NOTE — Progress Notes (Signed)
Physical Therapy Note  Spoke with occupational therapy after their initial evaluation. OT reports patient is functioning at a high level of independence and no physical therapy is indicated at this time. PT is signing-off. Please re-order if there is any significant change in status. Thank you for this referral.   Kathlyn Sacramento, PT, DPT [facility]  Rehabilitation Services Physical Therapist Office: [phone] Website: [email or web address]

## 2024-05-01 NOTE — Evaluation (Signed)
 Occupational Therapy Evaluation Patient Details Name: [name] MRN: [id number] DOB: 1956-05-02 Today's Date: 05/01/2024   History of Present Illness   Pt is a 68y.o male admitted 10/20 for [facility]. CT showed concerns for lung cancer. S/p TAVR using transfemoral approach 10/28. PMH: tobacco abuse, PAD, PAF, COPD, CAD, CHF, HTN     Clinical Impressions Pt admitted based on above, and was seen based on problem list below. PTA pt was independent with ADLs and IADLs. Today pt is at his baseline for ADLs and mobility. Pt completed LB dressing and standing ADLs ind. Ambulated 280ft without AD. Pt on RA sustaining O2 levels >92%. Brief sat to 85% at end of mobility, pt self recovers quickly to 92%. Verbally educated pt on use of energy conservation strategies at d/c, pt verbalized understanding. No follow up OT or DME needs. Encouraged frequent mobilization with nursing staff and mobility team. OT is signing off on this pt.     If plan is discharge home, recommend the following:   Assistance with cooking/housework     Functional Status Assessment   Patient has not had a recent decline in their functional status     Equipment Recommendations   None recommended by OT      Precautions/Restrictions   Precautions Precautions: Fall Recall of Precautions/Restrictions: Intact Restrictions Weight Bearing Restrictions Per Provider Order: No     Mobility Bed Mobility       General bed mobility comments: Received in recliner    Transfers Overall transfer level: Independent Equipment used: None       General transfer comment: Ambulated ~263ft, no AD. No LOB or sway      Balance Overall balance assessment: No apparent balance deficits (not formally assessed)     ADL either performed or assessed with clinical judgement   ADL Overall ADL's : Independent       General ADL Comments: Pt completed LB dressing and standing ADLs ind. Educated pt on energy conservation  strategies for d/c     Vision Baseline Vision/History: 1 Wears glasses Patient Visual Report: No change from baseline Vision Assessment?: No apparent visual deficits            Pertinent Vitals/Pain Pain Assessment Pain Assessment: No/denies pain     Extremity/Trunk Assessment Upper Extremity Assessment Upper Extremity Assessment: Overall WFL for tasks assessed   Lower Extremity Assessment Lower Extremity Assessment: Overall WFL for tasks assessed   Cervical / Trunk Assessment Cervical / Trunk Assessment: Normal   Communication Communication Communication: No apparent difficulties   Cognition Arousal: Alert Behavior During Therapy: WFL for tasks assessed/performed Cognition: No apparent impairments       Following commands: Intact       Cueing  General Comments   Cueing Techniques: Verbal cues  Pt on RA at rest sats at 93% ambulating sustaining O2. Brief sat to 85%, able to self recover quickly to 92%           Home Living Family/patient expects to be discharged to:: Private residence Living Arrangements: Alone Available Help at Discharge: Family;Available PRN/intermittently Type of Home: House Home Access: Stairs to enter [employer] of Steps: 4 Entrance Stairs-Rails: Can reach both Home Layout: One level     Bathroom Shower/Tub: Chief Strategy Officer: Standard     Home Equipment: Agricultural Consultant (2 wheels);Cane - quad;BSC/3in1;Wheelchair - manual          Prior Functioning/Environment Prior Level of Function : Independent/Modified Independent  Mobility Comments: Ind, no AD      OT Problem List: Cardiopulmonary status limiting activity        OT Goals(Current goals can be found in the care plan section)   Acute Rehab OT Goals Patient Stated Goal: To go home OT Goal Formulation: All assessment and education complete, DC therapy Time For Goal Achievement: 05/15/24 Potential to Achieve Goals:  Good   AM-PAC OT 6 Clicks Daily Activity     Outcome Measure Help from another person eating meals?: None Help from another person taking care of personal grooming?: None Help from another person toileting, which includes using toliet, bedpan, or urinal?: None Help from another person bathing (including washing, rinsing, drying)?: None Help from another person to put on and taking off regular upper body clothing?: None Help from another person to put on and taking off regular lower body clothing?: None 6 Click Score: 24   End of Session Equipment Utilized During Treatment: Gait belt Nurse Communication: Mobility status  Activity Tolerance: Patient tolerated treatment well Patient left: in chair;with call bell/phone within reach  OT Visit Diagnosis: Other (comment) (Cardiopulmonary)                Time: 9086-9062 OT Time Calculation (min): 24 min Charges:  OT General Charges $OT Visit: 1 Visit OT Evaluation $OT Eval Moderate Complexity: 1 Mod  [name], OT  Acute Rehabilitation Services Office [phone] Secure chat preferred   [name] Savers 05/01/2024, 11:08 AM

## 2024-05-02 DIAGNOSIS — I5020 Unspecified systolic (congestive) heart failure: Secondary | ICD-10-CM | POA: Diagnosis not present

## 2024-05-02 LAB — BASIC METABOLIC PANEL WITH GFR
Anion gap: 8 (ref 5–15)
BUN: 21 mg/dL (ref 8–23)
CO2: 27 mmol/L (ref 22–32)
Calcium: 8.9 mg/dL (ref 8.9–10.3)
Chloride: 99 mmol/L (ref 98–111)
Creatinine, Ser: 0.63 mg/dL (ref 0.61–1.24)
GFR, Estimated: >60 mL/min (ref >60)
Glucose, Bld: 105 mg/dL — ABNORMAL HIGH (ref 70–99)
Potassium: 3.8 mmol/L (ref 3.5–5.1)
Sodium: 134 mmol/L — ABNORMAL LOW (ref 135–145)

## 2024-05-02 LAB — CBC
HCT: 36.7 % — ABNORMAL LOW (ref 39.0–52.0)
Hemoglobin: 12.3 g/dL — ABNORMAL LOW (ref 13.0–17.0)
MCH: 33.2 pg (ref 26.0–34.0)
MCHC: 33.5 g/dL (ref 30.0–36.0)
MCV: 99.2 fL (ref 80.0–100.0)
Platelets: 157 K/uL (ref 150–400)
RBC: 3.7 MIL/uL — ABNORMAL LOW (ref 4.22–5.81)
RDW: 13.5 % (ref 11.5–15.5)
WBC: 6.8 K/uL (ref 4.0–10.5)
nRBC: 0 % (ref 0.0–0.2)

## 2024-05-02 LAB — MAGNESIUM: Magnesium: 3.9 mg/dL — ABNORMAL HIGH (ref 1.7–2.4)

## 2024-05-02 LAB — GLUCOSE, CAPILLARY
Glucose-Capillary: 86 mg/dL (ref 70–99)
Glucose-Capillary: 109 mg/dL — ABNORMAL HIGH (ref 70–99)
Glucose-Capillary: 91 mg/dL (ref 70–99)
Glucose-Capillary: 126 mg/dL — ABNORMAL HIGH (ref 70–99)

## 2024-05-02 LAB — COOXEMETRY PANEL
Carboxyhemoglobin: 2.2 % — ABNORMAL HIGH (ref 0.5–1.5)
Methemoglobin: <0.7 % (ref 0.0–1.5)
O2 Saturation: 69.8 %
Total hemoglobin: 12.7 g/dL (ref 12.0–16.0)

## 2024-05-02 MED ORDER — POTASSIUM CHLORIDE CRYS ER 20 MEQ PO TBCR
40.0000 meq | EXTENDED_RELEASE_TABLET | Freq: Once | ORAL | Status: AC
  Administered 2024-05-02: 40 meq via ORAL
  Filled 2024-05-02: qty 2

## 2024-05-02 NOTE — Progress Notes (Addendum)
  Progress Note  Patient Name: [name] Date of Encounter: 05/02/2024 [facility] Cardiologist: Vinie JAYSON Maxcy, MD   Interval Summary   Still feeling well, sitting up in chair, yesterday 2.3 L out net up from 1 L net the day prior  Creatinine 0.63, potassium 3.8 stable  Vital Signs Vitals:   05/01/24 2334 05/02/24 0328 05/02/24 0401 05/02/24 0751  BP: (!) 106/54 (!) 105/55  107/67  Pulse: (!) 57 (!) 53  60  Resp: 14 19    Temp: 98.3 F (36.8 C) 97.7 F (36.5 C)  98.2 F (36.8 C)  TempSrc: Oral Oral  Oral  SpO2: 92% 97%  95%  Weight:   70.6 kg   Height:        Intake/Output Summary (Last 24 hours) at 05/02/2024 0826 Last data filed at 05/02/2024 0754 Gross per 24 hour  Intake 148.21 ml  Output 2350 ml  Net -2201.79 ml      05/02/2024    4:01 AM 05/01/2024    5:40 AM 04/30/2024   12:25 AM  Last 3 Weights  Weight (lbs) 155 lb 10.3 oz 155 lb 13.8 oz 160 lb 0.9 oz  Weight (kg) 70.6 kg 70.7 kg 72.6 kg      Telemetry/ECG  Occasional PVCs, 5 beats nonsustained VT, no sustained adverse arrhythmias sinus rhythm- Personally Reviewed  Physical Exam  GEN: No acute distress.   Neck: No JVD Cardiac: RRR, n1/6 SM, no rubs, or gallops.  Respiratory: Minimal crackles right base. GI: Soft, nontender, non-distended  MS: No edema  Assessment & Plan   Acute on chronic systolic heart failure/cardiogenic shock - Resume low-dose torsemide 20 mg daily yesterday on 05/01/2024 - Continuing low-dose IV dobutamine (1 mcg) that was difficult to wean last week. - Co. ox 70 today -Blood pressure has been too low for Entresto -Hopefully will be able to wean tomorrow off of dobutamine.  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 -Eliquis 5 mg twice a day  Spiculated left apical pulmonary nodule/prostate enlargement - Incidental findings noted on CT prior to TAVR with plan to evaluate PET/CT as  outpatient discussed with oncology PSA was normal at 1.9 will follow-up with outpatient urology  Coronary artery disease - 95% stenosis of large posterior lateral branch on cardiac catheterization, treating medically, minimally elevated troponin suspect supply/demand mismatch from shock/heart failure  Paroxysmal atrial fibrillation - Continue with Eliquis, maintaining sinus rhythm  Peripheral arterial disease - Prior peripheral intervention in 2018  Smoking with suspected COPD - Continue to encourage cessation      For questions or updates, please contact  [facility] Please consult [email or web address] for contact info under         Signed, Oneil Parchment, MD

## 2024-05-02 NOTE — Plan of Care (Signed)

## 2024-05-02 NOTE — Plan of Care (Signed)
  Problem: Education: Goal: Understanding of cardiac disease, CV risk reduction, and recovery process will improve 05/02/2024 1321 by Myrna Lauraine FALCON, RN Outcome: Progressing 05/02/2024 1320 by Myrna Lauraine FALCON, RN Outcome: Progressing   Problem: Activity: Goal: Ability to tolerate increased activity will improve 05/02/2024 1321 by Myrna Lauraine FALCON, RN Outcome: Progressing 05/02/2024 1320 by Myrna Lauraine FALCON, RN Outcome: Progressing   Problem: Cardiac: Goal: Ability to achieve and maintain adequate cardiovascular perfusion will improve 05/02/2024 1321 by Myrna Lauraine FALCON, RN Outcome: Progressing 05/02/2024 1320 by Myrna Lauraine FALCON, RN Outcome: Progressing   Problem: Health Behavior/Discharge Planning: Goal: Ability to safely manage health-related needs after discharge will improve 05/02/2024 1321 by Myrna Lauraine FALCON, RN Outcome: Progressing 05/02/2024 1320 by Myrna Lauraine FALCON, RN Outcome: Progressing

## 2024-05-02 NOTE — Progress Notes (Signed)
 Mob  05/02/24 1055  Mobility  Activity Ambulated independently  Level of Assistance Modified independent, requires aide device or extra time  Assistive Device None  Distance Ambulated (ft) 800 ft  Activity Response Tolerated well  Mobility Referral Yes  Mobility visit 1 Mobility  Mobility Specialist Start Time (ACUTE ONLY) 1055  Mobility Specialist Stop Time (ACUTE ONLY) 1108  Mobility Specialist Time Calculation (min) (ACUTE ONLY) 13 min   Pt eager for mobility. Required no physical assistance during ambulation. SPO2 maintained well on RA. No c/o when asked. Pt returned back to chair and left with all needs met, call bell in reach.   Lauraine Erm Mobility Specialist Please contact via SecureChat or [employer] [phone]

## 2024-05-03 DIAGNOSIS — I350 Nonrheumatic aortic (valve) stenosis: Secondary | ICD-10-CM | POA: Diagnosis not present

## 2024-05-03 DIAGNOSIS — I5023 Acute on chronic systolic (congestive) heart failure: Secondary | ICD-10-CM | POA: Diagnosis not present

## 2024-05-03 LAB — BASIC METABOLIC PANEL WITH GFR
Anion gap: 10 (ref 5–15)
BUN: 21 mg/dL (ref 8–23)
CO2: 27 mmol/L (ref 22–32)
Calcium: 9 mg/dL (ref 8.9–10.3)
Chloride: 100 mmol/L (ref 98–111)
Creatinine, Ser: 0.7 mg/dL (ref 0.61–1.24)
GFR, Estimated: >60 mL/min (ref >60)
Glucose, Bld: 92 mg/dL (ref 70–99)
Potassium: 4 mmol/L (ref 3.5–5.1)
Sodium: 137 mmol/L (ref 135–145)

## 2024-05-03 LAB — COOXEMETRY PANEL
Carboxyhemoglobin: 2.3 % — ABNORMAL HIGH (ref 0.5–1.5)
Carboxyhemoglobin: 1.5 % (ref 0.5–1.5)
Methemoglobin: 0.8 % (ref 0.0–1.5)
Methemoglobin: <0.7 % (ref 0.0–1.5)
O2 Saturation: 74.2 %
O2 Saturation: 60.4 %
Total hemoglobin: 12.9 g/dL (ref 12.0–16.0)
Total hemoglobin: 13.3 g/dL (ref 12.0–16.0)

## 2024-05-03 LAB — GLUCOSE, CAPILLARY
Glucose-Capillary: 86 mg/dL (ref 70–99)
Glucose-Capillary: 97 mg/dL (ref 70–99)
Glucose-Capillary: 94 mg/dL (ref 70–99)
Glucose-Capillary: 101 mg/dL — ABNORMAL HIGH (ref 70–99)

## 2024-05-03 LAB — MAGNESIUM: Magnesium: 2 mg/dL (ref 1.7–2.4)

## 2024-05-03 MED ORDER — SPIRONOLACTONE 12.5 MG HALF TABLET
12.5000 mg | ORAL_TABLET | Freq: Every day | ORAL | Status: DC
Start: 2024-05-03 — End: 2024-05-03
  Administered 2024-05-03: 12.5 mg via ORAL
  Filled 2024-05-03: qty 1

## 2024-05-03 MED ORDER — EMPAGLIFLOZIN 10 MG PO TABS
10.0000 mg | ORAL_TABLET | Freq: Every day | ORAL | Status: DC
  Administered 2024-05-03 – 2024-05-04 (×2): 10 mg via ORAL
  Filled 2024-05-03 (×2): qty 1

## 2024-05-03 MED ORDER — SPIRONOLACTONE 25 MG PO TABS
25.0000 mg | ORAL_TABLET | Freq: Every day | ORAL | Status: DC
  Administered 2024-05-04: 25 mg via ORAL
  Filled 2024-05-03: qty 1

## 2024-05-03 NOTE — Plan of Care (Signed)
  Problem: Activity: Goal: Ability to tolerate increased activity will improve Outcome: Progressing   Problem: Health Behavior/Discharge Planning: Goal: Ability to safely manage health-related needs after discharge will improve Outcome: Progressing   Problem: Education: Goal: Knowledge of General Education information will improve Description: Including pain rating scale, medication(s)/side effects and non-pharmacologic comfort measures Outcome: Progressing   Problem: Health Behavior/Discharge Planning: Goal: Ability to manage health-related needs will improve Outcome: Progressing   Problem: Clinical Measurements: Goal: Cardiovascular complication will be avoided Outcome: Progressing

## 2024-05-03 NOTE — Progress Notes (Signed)
 Mobility Specialist Progress Note;   05/03/24 0900  Mobility  Activity Ambulated independently  Level of Assistance Modified independent, requires aide device or extra time  Assistive Device None  Distance Ambulated (ft) 1000 ft  Activity Response Tolerated well  Mobility Referral Yes  Mobility visit 1 Mobility  Mobility Specialist Start Time (ACUTE ONLY) 0900  Mobility Specialist Stop Time (ACUTE ONLY) 0913  Mobility Specialist Time Calculation (min) (ACUTE ONLY) 13 min   Pt eager for mobility. Required no physical assistance during ambulation. SPO2 93%> throughout ambulation w/ no c/o when asked. Pt returned back to chair and left with all needs met, call bell in reach.   Lauraine Erm Mobility Specialist Please contact via SecureChat or [employer] [phone]

## 2024-05-03 NOTE — TOC Transition Note (Signed)
 Transition of Care [facility]) - Discharge Note   Patient Details  Name: [name] MRN: [id number] Date of Birth: November 29, 1955  Transition of Care [facility]) CM/SW Contact:  Arlana JINNY Nicholaus ISRA[place] Phone Number: [phone] 05/03/2024, 12:00 PM   Clinical Narrative:   HF CSW called to schedule patients Hospital follow up appointment for Tuesday, May 11, 2024 at 2:00 PM.       Barriers to Discharge: Continued Medical Work up   Patient Goals and CMS Choice Patient states their goals for this hospitalization and ongoing recovery are:: wants to recover          Discharge Placement                       Discharge Plan and Services Additional resources added to the After Visit Summary for     Discharge Planning Services: CM Consult                                 Social Drivers of Health (SDOH) Interventions SDOH Screenings   Food Insecurity: No Food Insecurity (04/19/2024)  Housing: Low Risk  (04/19/2024)  Transportation Needs: No Transportation Needs (04/19/2024)  Utilities: Not At Risk (04/19/2024)  Social Connections: Socially Isolated (04/19/2024)  Tobacco Use: High Risk (04/23/2024)     Readmission Risk Interventions     No data to display

## 2024-05-03 NOTE — Care Management Important Message (Signed)
 Important Message  Patient Details  Name: [name] MRN: [id number] Date of Birth: 1955/12/02   Important Message Given:  Yes - Medicare IM     [name] 05/03/2024, 3:55 PM

## 2024-05-03 NOTE — Care Management Important Message (Signed)
 Important Message  Patient Details  Name: [name] MRN: [id number] Date of Birth: Jul 17, 1955   Important Message Given:        [name] 05/03/2024, 3:45 PM

## 2024-05-03 NOTE — Progress Notes (Signed)
 Advanced Heart Failure Rounding Note  Cardiologist: Vinie JAYSON Maxcy, MD  AHF: Dr. Cherrie  Chief Complaint: S/p VIV TAVR Patient Profile   68 y/o male with h/o bioprosthetic AVR in 2017, PAF, PAD, COPD and tobacco use, admitted w/ CS in the setting of severe prosthetic aortic valve stenosis. Admit Echo AoV mean gradient 49 mmHg. LVEF 20-25% (down from 40-45%), RV ok.   Subjective:    10/28: S/P VIV TAVR  Post-op echo EF 30%, G2DD, mod MR (improved), mild/mod AI, AoV mean gradient 22 mmHg 10/30: Failed DBA wean. Restarted at 2.5 mcg/kg/min.   Co-ox 74% on DBA 1. Weight stable.   Sitting up in the chair. Walked with PT this morning. No complaints, wants to go home.   Objective:    Weight Range: 71.8 kg Body mass index is 22.71 kg/m.   Vital Signs:   Temp:  [97.6 F (36.4 C)-98.7 F (37.1 C)] 97.8 F (36.6 C) (11/03 0724) Pulse Rate:  [56-67] 57 (11/03 0537) Resp:  [15-20] 17 (11/03 0724) BP: (102-115)/(47-63) 114/59 (11/03 0724) SpO2:  [93 %-97 %] 95 % (11/03 0724) Weight:  [71.8 kg] 71.8 kg (11/03 0537) Last BM Date : 05/01/24  Weight change: Filed Weights   05/01/24 0540 05/02/24 0401 05/03/24 0537  Weight: 70.7 kg 70.6 kg 71.8 kg   Intake/Output:  Intake/Output Summary (Last 24 hours) at 05/03/2024 0900 Last data filed at 05/03/2024 0537 Gross per 24 hour  Intake 267.45 ml  Output 800 ml  Net -532.55 ml    Physical Exam   General: Well appearing. No distress Cardiac: JVP flat. S1 and S2 present. No murmurs Abdomen: Soft, non-tender, non-distended.  Extremities: Warm and dry.  No peripheral edema.  Neuro: Alert and oriented x3. Affect pleasant. Moves all extremities without difficulty.  Telemetry   SB 50-60s (personally reviewed)  Labs    CBC Recent Labs    05/01/24 0525 05/02/24 0340  WBC 5.7 6.8  HGB 12.4* 12.3*  HCT 37.3* 36.7*  MCV 99.2 99.2  PLT 143* 157   Basic Metabolic Panel Recent Labs    88/97/74 0340 05/03/24 0500  NA  134* 137  K 3.8 4.0  CL 99 100  CO2 27 27  GLUCOSE 105* 92  BUN 21 21  CREATININE 0.63 0.70  CALCIUM  8.9 9.0  MG 3.9* 2.0   Liver Function Tests No results for input(s): AST, ALT, ALKPHOS, BILITOT, PROT, ALBUMIN  in the last 72 hours.  BNP (last 3 results) Recent Labs    03/25/24 1359 04/19/24 1133  BNP 1,332.6* 1,400.2*   Hemoglobin A1C No results for input(s): HGBA1C in the last 72 hours.  Medications:    Scheduled Medications:  apixaban  5 mg Oral BID   atorvastatin   10 mg Oral Daily   Chlorhexidine  Gluconate Cloth  6 each Topical Daily   finasteride   5 mg Oral QPM   polyethylene glycol  17 g Oral BID   senna  1 tablet Oral BID   sodium chloride  flush  10-40 mL Intracatheter Q12H   sodium chloride  flush  3 mL Intravenous Q12H   sodium chloride  flush  3 mL Intravenous Q12H   tamsulosin   0.4 mg Oral QPC supper   torsemide  20 mg Oral Daily    Infusions:  sodium chloride      DOBUTamine 1 mcg/kg/min (05/03/24 0416)    PRN Medications: sodium chloride , acetaminophen , albuterol , ondansetron  (ZOFRAN ) IV, mouth rinse, oxyCODONE , sodium chloride  flush, sodium chloride  flush, sodium chloride  flush, traZODone  Assessment/Plan   1. Acute on chronic systolic CHF/cardiogenic shock: Echo this admission with EF 20-25%, moderate LV dilation,  RV normal, moderate-severe MR, bioprosthetic aortic valve with severe stenosis and mean gradient 49 with mild-moderate AI, IVC not dilated.  Prior echo had shown EF 45-50%.  In the past, EF dropped with severe AS.  Suspect this is the cause again.  LHC in 2017 showed nonobstructive CAD, LHC this admission showed 95% stenosis large RPLV branch.  On RHC, RA pressure was low but PCWP elevated.  - Intra-op echo with EF 25%, mild/mid MR - Echo 10/29: EF 30% w mod MR - Failed DBA wean 10/30, restarted @ 2.5  - Co-ox 74% today. Stop DBA; repeat co-ox at noon - continue torsemide 20 (had acute pulmonary edema a few days ago) - BP  too low for ARB/ARNi - start spiro 12.5 mg daily - start jardiance  10 mg daily - no ? blocker w/ low output   2. Aortic stenosis: Severe bioprosthetic AS with mild-moderate AI. He initially had AVR for bicuspid aortic valve. Structural heart team has seen for evaluation for valve-in-valve TAVR. Unfortunately, pre-TAVR peripheral vascular scans show severe peripheral artery disease that will likely preclude TF access.  - s/p VIV TAVR 04/27/24 - intra-op mean gradient 8 mmHg; formal post-op 22 mmHg with mod AI - continue eliquis 5 mg bid  3. Spiculated Left Apical Pulmonary Nodule/ Prostatomegaly - incidental findings on TAVR protocol CTA - plan evaluation by PET-CT as outpatient -> d/w Oncology - F/u PSA reassuring, normal at 1.9  mg/ml, can follow w/ outpatient urology   4. CAD: No chest pain, minimally elevated troponin with no trend.  Suspect demand ischemia from shock/CHF.  LHC shows 95% stenosis in large RPLV branch.  For the time being, will manage medically.  - continue eliquis, no ASA - Continue statin - No s/s angina  5. Atrial fibrillation: Paroxysmal.  - Remains in NSR - continue eliquis  6 PAD: History of peripheral intervention in 2018.    7. Smoking/suspected COPD: Needs to quit.    Length of Stay: 50  Bobetta Korf, NP  05/03/2024, 9:00 AM  Advanced Heart Failure Team Pager [phone] (M-F; 7a - 5p)  Please contact [facility] for night-coverage after hours (5p -7a ) and weekends on [email or web address]

## 2024-05-04 ENCOUNTER — Other Ambulatory Visit (HOSPITAL_COMMUNITY): Payer: Self-pay

## 2024-05-04 DIAGNOSIS — I5020 Unspecified systolic (congestive) heart failure: Secondary | ICD-10-CM | POA: Diagnosis not present

## 2024-05-04 LAB — MAGNESIUM: Magnesium: 1.9 mg/dL (ref 1.7–2.4)

## 2024-05-04 LAB — GLUCOSE, CAPILLARY
Glucose-Capillary: 97 mg/dL (ref 70–99)
Glucose-Capillary: 86 mg/dL (ref 70–99)

## 2024-05-04 LAB — COOXEMETRY PANEL
Carboxyhemoglobin: 1.6 % — ABNORMAL HIGH (ref 0.5–1.5)
Methemoglobin: <0.7 % (ref 0.0–1.5)
O2 Saturation: 65.2 %
Total hemoglobin: 13.5 g/dL (ref 12.0–16.0)

## 2024-05-04 MED ORDER — SPIRONOLACTONE 25 MG PO TABS
25.0000 mg | ORAL_TABLET | Freq: Every day | ORAL | 6 refills | Status: DC
  Filled 2024-05-04: qty 30, 30d supply, fill #0

## 2024-05-04 MED ORDER — TORSEMIDE 20 MG PO TABS
20.0000 mg | ORAL_TABLET | Freq: Every day | ORAL | 6 refills | Status: DC
  Filled 2024-05-04: qty 30, 30d supply, fill #0

## 2024-05-04 MED ORDER — APIXABAN 5 MG PO TABS
5.0000 mg | ORAL_TABLET | Freq: Two times a day (BID) | ORAL | 6 refills | Status: DC
  Filled 2024-05-04: qty 60, 30d supply, fill #0

## 2024-05-04 NOTE — Plan of Care (Signed)
  Problem: Education: Goal: Understanding of cardiac disease, CV risk reduction, and recovery process will improve Outcome: Adequate for Discharge Goal: Individualized Educational Video(s) Outcome: Adequate for Discharge   Problem: Activity: Goal: Ability to tolerate increased activity will improve Outcome: Adequate for Discharge   Problem: Cardiac: Goal: Ability to achieve and maintain adequate cardiovascular perfusion will improve Outcome: Adequate for Discharge   Problem: Health Behavior/Discharge Planning: Goal: Ability to safely manage health-related needs after discharge will improve Outcome: Adequate for Discharge   Problem: Education: Goal: Knowledge of General Education information will improve Description: Including pain rating scale, medication(s)/side effects and non-pharmacologic comfort measures Outcome: Adequate for Discharge   Problem: Health Behavior/Discharge Planning: Goal: Ability to manage health-related needs will improve Outcome: Adequate for Discharge   Problem: Clinical Measurements: Goal: Ability to maintain clinical measurements within normal limits will improve Outcome: Adequate for Discharge Goal: Will remain free from infection Outcome: Adequate for Discharge Goal: Diagnostic test results will improve Outcome: Adequate for Discharge Goal: Respiratory complications will improve Outcome: Adequate for Discharge Goal: Cardiovascular complication will be avoided Outcome: Adequate for Discharge   Problem: Activity: Goal: Risk for activity intolerance will decrease Outcome: Adequate for Discharge   Problem: Nutrition: Goal: Adequate nutrition will be maintained Outcome: Adequate for Discharge   Problem: Coping: Goal: Level of anxiety will decrease Outcome: Adequate for Discharge   Problem: Elimination: Goal: Will not experience complications related to bowel motility Outcome: Adequate for Discharge Goal: Will not experience complications  related to urinary retention Outcome: Adequate for Discharge   Problem: Pain Managment: Goal: General experience of comfort will improve and/or be controlled Outcome: Adequate for Discharge   Problem: Safety: Goal: Ability to remain free from injury will improve Outcome: Adequate for Discharge   Problem: Skin Integrity: Goal: Risk for impaired skin integrity will decrease Outcome: Adequate for Discharge   Problem: Education: Goal: Understanding of CV disease, CV risk reduction, and recovery process will improve Outcome: Adequate for Discharge Goal: Individualized Educational Video(s) Outcome: Adequate for Discharge   Problem: Activity: Goal: Ability to return to baseline activity level will improve Outcome: Adequate for Discharge   Problem: Cardiovascular: Goal: Ability to achieve and maintain adequate cardiovascular perfusion will improve Outcome: Adequate for Discharge Goal: Vascular access site(s) Level 0-1 will be maintained Outcome: Adequate for Discharge   Problem: Health Behavior/Discharge Planning: Goal: Ability to safely manage health-related needs after discharge will improve Outcome: Adequate for Discharge

## 2024-05-04 NOTE — Progress Notes (Signed)
 Discharge Nurse Summary: DC order noted per MD. DC RN at bedside with patient. Patient agreeable with discharge plan, states family will arrive soon for pickup. AVS printed/reviewed. PICC line removed by IV team, skin intact. No DME needs. No home meds. TOC meds delivered to the patient. CP/Edu resolved. Telemonitor returned to charging station. All belongings accounted for. Dressing to wound, CDI w/o bleeding or drainage. See LDAs. Patient wheeled downstairs to dc lounge to await family transport.  Rosario EMERSON Lund, RN

## 2024-05-04 NOTE — Progress Notes (Signed)
 Mobility Specialist Progress Note;   05/04/24 0911  Mobility  Activity Ambulated independently  Level of Assistance Modified independent, requires aide device or extra time  Assistive Device None  Distance Ambulated (ft) 1000 ft  Activity Response Tolerated well  Mobility Referral Yes  Mobility visit 1 Mobility  Mobility Specialist Start Time (ACUTE ONLY) 0911  Mobility Specialist Stop Time (ACUTE ONLY) [id number]  Mobility Specialist Time Calculation (min) (ACUTE ONLY) 12 min   Pt eager for mobility. Required no physical assistance during ambulation. VSS on RA and no c/o when asked. Eager for discharge. Pt returned to chair and left with all needs met.   Lauraine Erm Mobility Specialist Please contact via SecureChat or [employer] [phone]

## 2024-05-04 NOTE — TOC Transition Note (Addendum)
 Transition of Care [facility]) - Discharge Note   Patient Details  Name: [name] MRN: [id number] Date of Birth: [birth year]  Transition of Care [facility]) CM/SW Contact:  Justina Delcia Czar, RN Phone Number: [phone] 05/04/2024, 12:42 PM   Clinical Narrative:    Spoke to pt and offered choice for [facility]. Medicare.gov with ratings given to pt and placed on chart. Pt agreeable to [employer] for [facility]. Contacted rep, [employer] with new referral.   Pt reports having scale at home for daily weights. Provided pt with Living Better with HF booklet. Pt states he follow a heart healthy/low sodium diet.   Pt states his dtr will assist him as needed. Will provide transportation home.  PCP appt scheduled for 05/11/2024 at 2 pm.     Final next level of care: Home w Home Health Services Barriers to Discharge: No Barriers Identified   Patient Goals and CMS Choice Patient states their goals for this hospitalization and ongoing recovery are:: wants to recover CMS Medicare.gov Compare Post Acute Care list provided to:: Patient Choice offered to / list presented to : Patient      Discharge Placement     Discharge Plan and Services Additional resources added to the After Visit Summary for     Discharge Planning Services: CM Consult Post Acute Care Choice: Home Health             Social Drivers of Health (SDOH) Interventions SDOH Screenings   Food Insecurity: No Food Insecurity (04/19/2024)  Housing: Low Risk  (04/19/2024)  Transportation Needs: No Transportation Needs (04/19/2024)  Utilities: Not At Risk (04/19/2024)  Social Connections: Socially Isolated (04/19/2024)  Tobacco Use: High Risk (04/23/2024)     Readmission Risk Interventions     No data to display

## 2024-05-05 ENCOUNTER — Encounter: Payer: Self-pay | Admitting: Medical Oncology

## 2024-05-05 NOTE — Progress Notes (Signed)
 Rapid Diagnostic Service for Malignancy [facility] [phone]   [phone]  INITIAL CONSULT NOTE  Patient Care Team: Sabas Norleen PARAS., MD as PCP - General (Family Medicine) Mona, Vinie BROCKS, MD as PCP - Cardiology (Cardiology) Thukkani, Arun K, MD as PCP - Structural Heart (Cardiology) Golden Forestine BROCKS, RN as Oncology Nurse Navigator (Medical Oncology)  Hematological/Oncological History 04/21/2024: CTA was obtained for TAVR workup showed spiculated left apical pulmonary nodule measuring up to 2.6 cm. Moderate bilateral layering pleural effusions larger on the right than left. Prostatomegaly with indentation of the bladder base. PSA was 1.90.  05/06/2024: Establish care with [facility] Rapid Diagnostic Service for Malignancy  CHIEF COMPLAINTS/PURPOSE OF CONSULTATION:  Left pulmonary nodule  HISTORY OF PRESENTING ILLNESS:  [name] Alert 68 y.o. male with medical history significant for hypertension, aortic valve stenosis s/p TAVR, heart failure, non-ischemic cardiomyopathy and asthma presents to the rapid diagnostic clinic for evaluation for left apical pulmonary nodule. He is unaccompanied for this visit.   On exam today, Mr. Fontenot reports he is doing well since recent hospitalization. His energy has improved and he is able to complete all his baseline ADLs independently. He has a good appetite without any dietary restrictions. He denies nausea, vomiting or bowel habit changes. He reports improvement of peripheral edema after starting diuretics but has frequent urination. He denies easy bruising or signs of bleeding. He denies fevers, chills, sweats, shortness of breath, chest pain or cough. He has no other complaints. Rest of the ROS is below.   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 Wendel and Dr. Daniel   Severe aortic stenosis     SURGICAL HISTORY: Past Surgical History:  Procedure Laterality Date   ABDOMINAL AORTOGRAM W/LOWER EXTREMITY N/A 10/16/2016   Procedure: Abdominal Aortogram w/Lower Extremity;  Surgeon: Deatrice DELENA Cage, MD;  Location: [facility] INVASIVE CV LAB;  Service: Cardiovascular;  Laterality: N/A;   AORTIC VALVE REPLACEMENT N/A 01/29/2016   Procedure: AORTIC VALVE REPLACEMENT (AVR) with Magna Ease size 25mm;  Surgeon: Maude Fleeta Ochoa, MD;  Location: [facility] OR;  Service: Open Heart Surgery;  Laterality: N/A;   CARDIAC CATHETERIZATION N/A 01/23/2016   Procedure: Right/Left Heart Cath and Coronary Angiography;  Surgeon: Vinie BROCKS Mona, MD;  Location: [facility] INVASIVE CV LAB;  Service: Cardiovascular;  Laterality: N/A;   CARDIOVERSION N/A 03/26/2016   Procedure: CARDIOVERSION;  Surgeon: Vinie BROCKS Mona, MD;  Location: [facility] ENDOSCOPY;  Service: Cardiovascular;  Laterality: N/A;   CATARACT EXTRACTION  12/2002 & 09/2007   ENDARTERECTOMY FEMORAL Left 11/01/2016   Procedure: ENDARTERECTOMY FEMORAL;  Surgeon: Serene Gaile ORN, MD;  Location: [facility] OR;  Service: Vascular;  Laterality: Left;   INTRAOPERATIVE TRANSTHORACIC ECHOCARDIOGRAM N/A 04/27/2024   Procedure: ECHOCARDIOGRAM, TRANSTHORACIC;  Surgeon: Wendel Lurena POUR, MD;  Location: [facility] INVASIVE CV LAB;  Service: Cardiovascular;  Laterality: N/A;   PATCH ANGIOPLASTY Left 11/01/2016  Procedure: PATCH ANGIOPLASTY;  Surgeon: Serene Gaile ORN, MD;  Location: [facility] OR;  Service: Vascular;  Laterality: Left;   RIGHT HEART CATH AND CORONARY ANGIOGRAPHY N/A 04/20/2024    Procedure: RIGHT HEART CATH AND CORONARY ANGIOGRAPHY;  Surgeon: Rolan Ezra RAMAN, MD;  Location: [facility] INVASIVE CV LAB;  Service: Cardiovascular;  Laterality: N/A;   SHOULDER SURGERY Right 07/01/1969   TEE WITHOUT CARDIOVERSION N/A 01/29/2016   Procedure: TRANSESOPHAGEAL ECHOCARDIOGRAM (TEE);  Surgeon: Maude Fleeta Ochoa, MD;  Location: [facility] OR;  Service: Open Heart Surgery;  Laterality: N/A;    SOCIAL HISTORY: Social History   Socioeconomic History   Marital status: Married    Spouse name: Not on file   Number of children: 4   Years of education: 10   Highest education level: Not on file  Occupational History   Occupation: holiday representative    Comment: Engineer, Mining  Tobacco Use   Smoking status: Former    Current packs/day: 0.00    Average packs/day: 1 pack/day for 48.0 years (48.0 ttl pk-yrs)    Types: Cigarettes    Start date: 01/23/1971    Quit date: 2025    Years since quitting: 0.8   Smokeless tobacco: Former  Advertising Account Planner   Vaping status: Former   Quit date: 08/28/2015  Substance and Sexual Activity   Alcohol use: Not Currently    Alcohol/week: 4.0 standard drinks of alcohol    Types: 4 Standard drinks or equivalent per week    Comment: beer   Drug use: No   Sexual activity: Not Currently  Other Topics Concern   Not on file  Social History Narrative   Not on file   Social Drivers of Health   Financial Resource Strain: Not on file  Food Insecurity: No Food Insecurity (05/06/2024)   Hunger Vital Sign    Worried About Running Out of Food in the Last Year: Never true    Ran Out of Food in the Last Year: Never true  Transportation Needs: No Transportation Needs (05/06/2024)   PRAPARE - Administrator, Civil Service (Medical): No    Lack of Transportation (Non-Medical): No  Physical Activity: Not on file  Stress: Not on file  Social Connections: Socially Isolated (04/19/2024)   Social Connection and Isolation Panel    Frequency of Communication with Friends  and Family: More than three times a week    Frequency of Social Gatherings with Friends and Family: Twice a week    Attends Religious Services: Never    Database Administrator or Organizations: No    Attends Banker Meetings: Never    Marital Status: Widowed  Intimate Partner Violence: Not At Risk (05/06/2024)   Humiliation, Afraid, Rape, and Kick questionnaire    Fear of Current or Ex-Partner: No    Emotionally Abused: No    Physically Abused: No    Sexually Abused: No    FAMILY HISTORY: Family History  Problem Relation Age of Onset   Breast cancer Mother    Breast cancer Sister     ALLERGIES:  is allergic to no known allergies.  MEDICATIONS:  Current Outpatient Medications  Medication Sig Dispense Refill   acetaminophen  (TYLENOL ) 500 MG tablet Take 1,000 mg by mouth daily as needed for headache.     albuterol  (VENTOLIN  HFA) 108 (90 Base) MCG/ACT inhaler Inhale 2 puffs into the lungs every 4 (four) hours as needed for wheezing or shortness of breath.     apixaban  (ELIQUIS ) 5 MG TABS  tablet Take 1 tablet (5 mg total) by mouth 2 (two) times daily. 60 tablet 6   atorvastatin  (LIPITOR) 10 MG tablet TAKE 1 TABLET BY MOUTH EVERY DAY 90 tablet 3   empagliflozin  (JARDIANCE ) 10 MG TABS tablet Take 1 tablet (10 mg total) by mouth daily before breakfast. 30 tablet 11   finasteride  (PROSCAR ) 5 MG tablet Take 1 tablet (5 mg total) by mouth daily. (Patient taking differently: Take 5 mg by mouth every evening.) 30 tablet 1   spironolactone  (ALDACTONE ) 25 MG tablet Take 1 tablet (25 mg total) by mouth daily. 30 tablet 6   tamsulosin  (FLOMAX ) 0.4 MG CAPS capsule Take 0.4 mg by mouth every evening.  1   torsemide  (DEMADEX ) 20 MG tablet Take 1 tablet (20 mg total) by mouth daily. 30 tablet 6   No current facility-administered medications for this visit.    REVIEW OF SYSTEMS:   Constitutional: ( - ) fevers, ( - )  chills , ( - ) night sweats Eyes: ( - ) blurriness of vision, ( - )  double vision, ( - ) watery eyes Ears, nose, mouth, throat, and face: ( - ) mucositis, ( - ) sore throat Respiratory: ( - ) cough, ( - ) dyspnea, ( - ) wheezes Cardiovascular: ( - ) palpitation, ( - ) chest discomfort, ( - ) lower extremity swelling Gastrointestinal:  ( - ) nausea, ( - ) heartburn, ( - ) change in bowel habits Skin: ( - ) abnormal skin rashes Lymphatics: ( - ) new lymphadenopathy, ( - ) easy bruising Neurological: ( - ) numbness, ( - ) tingling, ( - ) new weaknesses Behavioral/Psych: ( - ) mood change, ( - ) new changes  All other systems were reviewed with the patient and are negative.  PHYSICAL EXAMINATION: ECOG PERFORMANCE STATUS: 1 - Symptomatic but completely ambulatory  Vitals:   05/06/24 0848  BP: 104/63  Pulse: 70  Resp: 16  Temp: 98.6 F (37 C)  SpO2: 97%   Filed Weights   05/06/24 0848  Weight: 157 lb 6.4 oz (71.4 kg)    GENERAL: well appearing male in NAD  SKIN: skin color, texture, turgor are normal, no rashes or significant lesions EYES: conjunctiva are pink and non-injected, sclera clear OROPHARYNX: no exudate, no erythema; lips, buccal mucosa, and tongue normal  NECK: supple, non-tender LYMPH:  no palpable lymphadenopathy in the cervical, axillary or supraclavicular lymph nodes.  LUNGS: clear to auscultation and percussion with normal breathing effort HEART: regular rate & rhythm and no murmurs and no lower extremity edema ABDOMEN: soft, non-tender, non-distended, normal bowel sounds Musculoskeletal: no cyanosis of digits and no clubbing  PSYCH: alert & oriented x 3, fluent speech NEURO: no focal motor/sensory deficits  LABORATORY DATA:  I have reviewed the data as listed    Latest Ref Rng & Units 05/06/2024   10:01 AM 05/02/2024    3:40 AM 05/01/2024    5:25 AM  CBC  WBC 4.0 - 10.5 K/uL 10.3  6.8  5.7   Hemoglobin 13.0 - 17.0 g/dL 85.7  87.6  87.5   Hematocrit 39.0 - 52.0 % 42.4  36.7  37.3   Platelets 150 - 400 K/uL 175  157  143         Latest Ref Rng & Units 05/03/2024    5:00 AM 05/02/2024    3:40 AM 05/01/2024    5:25 AM  CMP  Glucose 70 - 99 mg/dL 92  894  85  BUN 8 - 23 mg/dL 21  21  14    Creatinine 0.61 - 1.24 mg/dL 9.29  9.36  9.34   Sodium 135 - 145 mmol/L 137  134  135   Potassium 3.5 - 5.1 mmol/L 4.0  3.8  3.9   Chloride 98 - 111 mmol/L 100  99  101   CO2 22 - 32 mmol/L 27  27  26    Calcium  8.9 - 10.3 mg/dL 9.0  8.9  8.7      RADIOGRAPHIC STUDIES: I have personally reviewed the radiological images as listed and agreed with the findings in the report. ECHOCARDIOGRAM COMPLETE Result Date: 04/28/2024    ECHOCARDIOGRAM REPORT   Patient Name:   [name] Date of Exam: 04/28/2024 Medical Rec #:  [id number]       Height:       70.0 in Accession #:    [id number]      Weight:       158.5 lb Date of Birth:  05/20/1956       BSA:          1.891 m Patient Age:    68 years        BP:           93/54 mmHg Patient Gender: M               HR:           61 bpm. Exam Location:  Inpatient Procedure: 2D Echo and Strain Analysis (Both Spectral and Color Flow Doppler            were utilized during procedure). Indications:    S/P TAVR  History:        Patient has prior history of Echocardiogram examinations. CHF;                 CAD.                 Aortic Valve: 26 mm Sapien prosthetic, stented (TAVR) valve is                 present in the aortic position.  Sonographer:    Charmaine Gaskins Referring Phys: THOMPSON, KATHRYN, R IMPRESSIONS  1. Left ventricular ejection fraction, by estimation, is 30 to 35%. Left ventricular ejection fraction by 3D volume is 30 %. The left ventricle has moderately decreased function. The left ventricle demonstrates global hypokinesis. There is mild left ventricular hypertrophy. Left ventricular diastolic parameters are consistent with Grade II diastolic dysfunction (pseudonormalization). The average left ventricular global longitudinal strain is -7.8 %. The global longitudinal strain is abnormal.   2. Right ventricular systolic function is normal. The right ventricular size is normal. There is normal pulmonary artery systolic pressure. The estimated right ventricular systolic pressure is 22.2 mmHg.  3. Left atrial size was severely dilated.  4. Right atrial size was severely dilated.  5. The mitral valve is degenerative. Moderate mitral valve regurgitation. Moderate mitral annular calcification.  6. The tricuspid valve is abnormal.  7. The aortic valve has been repaired/replaced. Aortic valve regurgitation is not visualized. There is a 26 mm Sapien prosthetic (TAVR) valve-in-valve (prior 25 mm MagnaEase valve) present in the aortic position. Echo findings are consistent with elevated gradients related to valve in valve and smaller annulus. Aortic valve area, by VTI measures 1.04 cm. Aortic valve mean gradient measures 22.0 mmHg. Aortic valve Vmax measures 3.21 m/s. Peak gradient 41.2 mmHg, DI 0.33. No perivalvular leak.  8. The inferior  vena cava is normal in size with greater than 50% respiratory variability, suggesting right atrial pressure of 3 mmHg. Comparison(s): Changes from prior study are noted. 04/19/2024: LVEF 20-25%, 25 mm Edwards MagnaEase bioprosthetic AVR - 49 mmHg MG, mild to moderate AI. FINDINGS  Left Ventricle: Left ventricular ejection fraction, by estimation, is 30 to 35%. Left ventricular ejection fraction by 3D volume is 30 %. The left ventricle has moderately decreased function. The left ventricle demonstrates global hypokinesis. The average left ventricular global longitudinal strain is -7.8 %. Strain was performed and the global longitudinal strain is abnormal. The left ventricular internal cavity size was normal in size. There is mild left ventricular hypertrophy. Left ventricular  diastolic parameters are consistent with Grade II diastolic dysfunction (pseudonormalization). Right Ventricle: The right ventricular size is normal. No increase in right ventricular wall thickness. Right  ventricular systolic function is normal. There is normal pulmonary artery systolic pressure. The tricuspid regurgitant velocity is 2.19 m/s, and  with an assumed right atrial pressure of 3 mmHg, the estimated right ventricular systolic pressure is 22.2 mmHg. Left Atrium: Left atrial size was severely dilated. Right Atrium: Right atrial size was severely dilated. Pericardium: There is no evidence of pericardial effusion. Mitral Valve: The mitral valve is degenerative in appearance. There is moderate thickening of the posterior and anterior mitral valve leaflet(s). There is moderate calcification of the anterior and posterior mitral valve leaflet(s). Moderate mitral annular calcification. Moderate mitral valve regurgitation, with centrally-directed jet. MV peak gradient, 5.8 mmHg. The mean mitral valve gradient is 2.0 mmHg. Tricuspid Valve: The tricuspid valve is abnormal. Tricuspid valve regurgitation is mild. Aortic Valve: The aortic valve has been repaired/replaced. Aortic valve regurgitation is not visualized. Aortic valve mean gradient measures 22.0 mmHg. Aortic valve peak gradient measures 41.2 mmHg. Aortic valve area, by VTI measures 1.04 cm. There is a  26 mm Sapien prosthetic, stented (TAVR) valve present in the aortic position. Pulmonic Valve: The pulmonic valve was grossly normal. Pulmonic valve regurgitation is trivial. Aorta: The aortic root and ascending aorta are structurally normal, with no evidence of dilitation. Venous: The inferior vena cava is normal in size with greater than 50% respiratory variability, suggesting right atrial pressure of 3 mmHg. IAS/Shunts: No atrial level shunt detected by color flow Doppler. Additional Comments: 3D was performed not requiring image post processing on an independent workstation and was abnormal. There is a small pleural effusion in both left and right lateral regions.  LEFT VENTRICLE PLAX 2D LVIDd:         6.40 cm         Diastology LVIDs:         5.40 cm          LV e' medial:    4.13 cm/s LV PW:         1.30 cm         LV E/e' medial:  26.9 LV IVS:        1.20 cm         LV e' lateral:   9.14 cm/s LVOT diam:     2.00 cm         LV E/e' lateral: 12.1 LV SV:         69 LV SV Index:   37              2D Longitudinal LVOT Area:     3.14 cm        Strain  2D Strain GLS   -7.9 %                                (A4C):                                2D Strain GLS   -7.3 %                                (A3C):                                2D Strain GLS   -8.1 %                                (A2C):                                2D Strain GLS   -7.8 %                                Avg:                                 3D Volume EF                                LV 3D EF:    Left                                             ventricul                                             ar                                             ejection                                             fraction                                             by 3D                                             volume is  30 %.                                 3D Volume EF:                                3D EF:        30 % RIGHT VENTRICLE RV Basal diam:  3.20 cm     PULMONARY VEINS RV Mid diam:    2.70 cm     Diastolic Velocity: 66.90 cm/s RV S prime:     13.90 cm/s  S/D Velocity:       0.60 RVSP:           22.2 mmHg   Systolic Velocity:  42.70 cm/s LEFT ATRIUM              Index        RIGHT ATRIUM           Index LA diam:        5.20 cm  2.75 cm/m   RA Pressure: 3.00 mmHg LA Vol (A2C):   136.0 ml 71.92 ml/m  RA Area:     28.20 cm LA Vol (A4C):   140.0 ml 74.04 ml/m  RA Volume:   92.50 ml  48.92 ml/m LA Biplane Vol: 139.0 ml 73.51 ml/m  AORTIC VALVE AV Area (Vmax):    1.11 cm AV Area (Vmean):   1.10 cm AV Area (VTI):     1.04 cm AV Vmax:           321.00 cm/s AV Vmean:          219.000 cm/s AV VTI:            0.667 m AV Peak Grad:       41.2 mmHg AV Mean Grad:      22.0 mmHg LVOT Vmax:         113.60 cm/s LVOT Vmean:        77.000 cm/s LVOT VTI:          0.221 m LVOT/AV VTI ratio: 0.33  AORTA Ao Root diam: 3.60 cm Ao Asc diam:  3.30 cm MITRAL VALVE                TRICUSPID VALVE MV Area (PHT): 3.23 cm     TR Peak grad:   19.2 mmHg MV Area VTI:   1.94 cm     TR Vmax:        219.00 cm/s MV Peak grad:  5.8 mmHg     Estimated RAP:  3.00 mmHg MV Mean grad:  2.0 mmHg     RVSP:           22.2 mmHg MV Vmax:       1.20 m/s MV Vmean:      59.8 cm/s    SHUNTS MV Decel Time: 235 msec     Systemic VTI:  0.22 m MV E velocity: 111.00 cm/s  Systemic Diam: 2.00 cm MV A velocity: 34.60 cm/s MV E/A ratio:  3.21 Vinie Maxcy MD Electronically signed by Vinie Maxcy MD Signature Date/Time: 04/28/2024/12:49:27 PM    Final    Structural Heart Procedure Result Date: 04/27/2024 See surgical note for result.  ECHOCARDIOGRAM LIMITED Result Date: 04/27/2024    ECHOCARDIOGRAM LIMITED REPORT   Patient Name:   [name] Date of Exam: 04/27/2024 Medical  Rec #:  [id number]       Height:       70.0 in Accession #:    [id number]      Weight:       157.2 lb Date of Birth:  24-Sep-1955       BSA:          1.884 m Patient Age:    68 years        BP:           93/66 mmHg Patient Gender: M               HR:           78 bpm. Exam Location:  Inpatient Procedure: Limited Echo, Color Doppler and Cardiac Doppler (Both Spectral and            Color Flow Doppler were utilized during procedure). Indications:     Aortic Stenosis i35.0  History:         Patient has prior history of Echocardiogram examinations, most                  recent 04/19/2024. CAD, COPD, Arrythmias:Atrial Flutter; Risk                  Factors:Dyslipidemia and Hypertension.                  Aortic Valve: 25 mm [facility] Ease valve is present in the                  aortic position. Procedure Date: 01/29/16.  Sonographer:     Damien Senior RDCS Referring Phys:  [id number] LAMARR JONELLE HUMMER Diagnosing Phys:  Stanly Leavens MD  Sonographer Comments: 26mm Edwards S3U TAVR placed valve-in-valve IMPRESSIONS  1. Left ventricular ejection fraction, by estimation, is 25%. The left ventricle has severely decreased function. The left ventricular internal cavity size was mildly dilated.  2. The mitral valve is grossly normal. Mild to moderate mitral valve regurgitation. No evidence of mitral stenosis.  3. Prior to procedure, a 25 mm Magna Ease was present with mild to moderate aortic regurgitation. Mean gradient 34 mm Hg, Peak gradient 51 mm Hg, DVI 0.17, EOA 0.71.     After procedure a 26 mm Edwards Sapien Valve was placed. No PVL. Mean gradient of 8 mm Hg, Peak gradient 15 mm Hg. DVI 0.40. EOA improved to 1.85 cm2. There is a 25 mm [employer] valve present in the aortic position. Procedure Date: 01/29/16. Echo findings are consistent with normal structure and function of the aortic valve prosthesis. FINDINGS  Left Ventricle: Left ventricular ejection fraction, by estimation, is 25%. The left ventricle has severely decreased function. The left ventricular internal cavity size was mildly dilated. Mitral Valve: The mitral valve is grossly normal. Mild to moderate mitral valve regurgitation. No evidence of mitral valve stenosis. Aortic Valve: Prior to procedure, a 25 mm Magna Ease was present with mild to moderate aortic regurgitation. Mean gradient 34 mm Hg, Peak gradient 51 mm Hg, DVI 0.17, EOA 0.71. After procedure a 26 mm Edwards Sapien Valve was placed. No PVL. Mean gradient of 8 mm Hg, Peak gradient 15 mm Hg. DVI 0.40. EOA improved to 1.85 cm2. Aortic valve mean gradient measures 8.0 mmHg. Aortic valve peak gradient measures 15.5 mmHg. Aortic valve area, by VTI measures 1.84 cm. There is a 25 mm [employer] valve present in the aortic position. Procedure Date: 01/29/16. Echo findings are consistent  with normal structure and function of the aortic valve prosthesis. Additional Comments: Spectral Doppler  performed. Color Doppler performed.  LEFT VENTRICLE PLAX 2D LVOT diam:     2.30 cm LV SV:         56 LV SV Index:   30 LVOT Area:     4.15 cm  AORTIC VALVE AV Area (Vmax):    1.68 cm AV Area (Vmean):   2.19 cm AV Area (VTI):     1.84 cm AV Vmax:           197.00 cm/s AV Vmean:          130.000 cm/s AV VTI:            0.303 m AV Peak Grad:      15.5 mmHg AV Mean Grad:      8.0 mmHg LVOT Vmax:         79.60 cm/s LVOT Vmean:        68.500 cm/s LVOT VTI:          0.134 m LVOT/AV VTI ratio: 0.44  SHUNTS Systemic VTI:  0.13 m Systemic Diam: 2.30 cm Stanly Leavens MD Electronically signed by Stanly Leavens MD Signature Date/Time: 04/27/2024/5:26:17 PM    Final    DG CHEST PORT 1 VIEW Result Date: 04/26/2024 CLINICAL DATA:  Shortness of breath EXAM: PORTABLE CHEST 1 VIEW COMPARISON:  Chest x-ray performed April 25, 2024 FINDINGS: Heart is enlarged. Postsurgical changes from sternotomy. Right-sided approach PICC line projects near the cavoatrial junction. Interstitial airspace opacities are present. Small bilateral pleural effusions. IMPRESSION: 1. Modest worsening and small bilateral pleural effusions. 2. Similar appearance of interstitial edema. 3. Satisfactory position right-sided PICC line. Electronically Signed   By: Maude Naegeli M.D.   On: 04/26/2024 07:41   DG CHEST PORT 1 VIEW Result Date: 04/25/2024 EXAM: 1 VIEW(S) XRAY OF THE CHEST 04/25/2024 09:16:00 PM COMPARISON: 04/21/2024 CLINICAL HISTORY: Respiratory distress FINDINGS: LINES, TUBES AND DEVICES: Right upper extremity PICC in place with tip terminating over the lower SVC. LUNGS AND PLEURA: Small bilateral pleural effusions. Diffuse interstitial and patchy airspace opacities, mildly increased compared to prior, favoring interstitial edema. No pneumothorax. HEART AND MEDIASTINUM: Stable cardiomegaly. Aortic arch atherosclerosis. Status post median sternotomy and aortic valve replacement. BONES AND SOFT TISSUES: No acute osseous  abnormality. IMPRESSION: 1. Cardiomegaly with mild/moderate interstitial edema. 2. Small bilateral pleural effusions. Electronically signed by: Pinkie Pebbles MD 04/25/2024 09:26 PM EDT RP Workstation: [id number]   MR BRAIN W WO CONTRAST Result Date: 04/22/2024 EXAM: MRI BRAIN WITH AND WITHOUT CONTRAST 04/22/2024 01:16:26 PM TECHNIQUE: Multiplanar multisequence MRI of the head/brain was performed with and without the administration of intravenous contrast. COMPARISON: None available. CLINICAL HISTORY: Brain metastases, unknown primary. FINDINGS: BRAIN AND VENTRICLES: There is no evidence of an acute infarct, mass, midline shift, hydrocephalus, or extra axial fluid collection. A focus of susceptibility in the region of the inferior 4th ventricle may reflect chronic hemorrhage or calcification. No intracranial hemorrhage is evident elsewhere. Scattered small T2 hyperintensities in the cerebral white matter bilaterally are nonspecific but compatible with mild chronic small vessel ischemic disease. No abnormal enhancement is identified. Mild cerebral atrophy is within normal limits for age. A normal variant cavum septum pellucidum et vergae is incidentally noted. Major intracranial vascular flow voids are preserved. ORBITS: Bilateral cataract extraction. SINUSES: Mucosal thickening and a moderately large amount of fluid in the right maxillary sinus. Clear mastoid air cells. BONES AND SOFT TISSUES: Normal bone marrow signal and enhancement. No  acute soft tissue abnormality. IMPRESSION: 1. No acute intracranial abnormality or evidence of metastatic disease. 2. Right maxillary sinus fluid, correlate for acute sinusitis. Electronically signed by: Dasie Hamburg MD 04/22/2024 01:50 PM EDT RP Workstation: [id number]   DG CHEST PORT 1 VIEW Result Date: 04/22/2024 CLINICAL DATA:  Migration of central line EXAM: PORTABLE CHEST 1 VIEW COMPARISON:  04/21/2024, 04/20/2024 FINDINGS: Right IJ Swan-Ganz catheter has been  retracted, the tip is directed to the right in the region of the central pulmonary artery. Right upper extremity central venous catheter tip partially obscured by the right IJ line, suspect that the tip overlies the SVC. Sternotomy and valve prosthesis. Stable cardiomediastinal silhouette with aortic atherosclerosis and pulmonary edema. Pleural effusions and basilar airspace disease. CT demonstrated spiculated left apical nodule not well seen radiographically. IMPRESSION: 1. Right IJ Swan-Ganz catheter has been retracted, the tip is directed to the right in the region of the central pulmonary artery. 2. Right upper extremity central venous catheter tip is partially obscured by the right IJ line, suspect that the tip overlies the SVC. 3. Pulmonary edema with pleural effusions and basilar airspace disease. Electronically Signed   By: Luke Bun M.D.   On: 04/22/2024 00:01   CT CORONARY MORPH W/CTA COR W/SCORE W/CA W/CM &/OR WO/CM Addendum Date: 04/21/2024 ADDENDUM REPORT: 04/21/2024 18:57 EXAM: OVER-READ INTERPRETATION CT CHEST The following report is an over-read performed by radiologist Dr. Rogelia Myers of [facility] on 04/21/2024. This over-read does not include interpretation of cardiac or coronary anatomy or pathology. The coronary CTA interpretation by the cardiologist is attached. COMPARISON:  04/20/2024 FINDINGS: Pulmonary Embolism: No pulmonary embolism. Pulmonary artery catheter in place terminating in a segmental branch of the right middle lobe. Cardiovascular: Normal appearance of extracardiac vascular structures. Dense multi-vessel coronary atherosclerosis. Aortic valve replacement. No pericardial effusion. No aortic aneurysm. Calcified plaque throughout the visualized aorta. Mediastinum/Nodes: No mediastinal mass. No mediastinal or hilar lymphadenopathy. Normal esophagus. Lungs/Pleura: The midline trachea and bronchi are patent. Emphysema. Intralobular septal thickening  predominantly within the lower lobes. Moderate bilateral pleural effusions with compressive atelectasis in both lower lobes. No pneumothorax. Musculoskeletal: No acute fracture or destructive bone lesion. Sternotomy wires. No sternal dehiscence. Multilevel degenerative disc disease of the spine. Upper Abdomen: No acute abnormality in the partially visualized upper abdomen. IMPRESSION: 1. Mild cardiomegaly with findings of interstitial edema. Moderate volume bilateral pleural effusions, larger on the right than the left with bibasilar compressive atelectasis. 2. Pulmonary artery catheter in place terminating in a segmental branch of the right middle lobe. Electronically Signed   By: Rogelia Myers M.D.   On: 04/21/2024 18:57   Result Date: 04/21/2024 CLINICAL DATA:  Prosthetic aortic valve pathology with assessment EXAM: Cardiac TAVR CT TECHNIQUE: The patient was scanned on a Siemens Force 192 slice scanner. A 120 kV retrospective scan was triggered in the descending thoracic aorta at 111 HU's. Gantry rotation speed was 270 msecs and collimation was .9 mm. No beta blockade or nitro were given. Fair image quality- imaged at heart rate of 86 bpm. The 3D data set was reconstructed in 5% intervals of the R-R cycle. Systolic and diastolic phases were analyzed on a dedicated work station using MPR, MIP and VRT modes. The patient received 100 cc of contrast. FINDINGS: Valve Characteristics Manufacturer and model: Edwards Magna Ease Size: 25 mm Year of implantation: 2017 Prosthetic Valve: Hypoattenuation and degree: Calcification of all prosthetic leaflets. Severe restriction of the non and left prosthetic leaflet. Restriction of the right prosthetic  leaflet. Level of coronary ostia above stent posts: NA Optimum Fluoroscopic Angle for Delivery: LAO 11, CAU 11 Prosthetic Valve- Left Main Coronary angle: RAO 0, CAU 21 Valves for structural team consideration via valve in valve selector (THV current- assuming no fracture):  26 mm Sapien Valve is preferred. RCA virtual valve to coronary distance: 5 mm LM virtual valve to coronary distance: 7 mm 29 mm Evolut Valve RCA virtual valve to coronary distance: 4 mm LM virtual valve to coronary distance: 5 mm Non prosthetic valve findings valve Findings: Coronary Arteries: Normal coronary origin. Study not completed with nitroglycerin . Known coronary artery disease. CAC scoring deferred. Aorta: Ascending aorta 38 mm, aortic atherosclerosis is prominent. Main Pulmonary artery: Mild dilation 30 mm Swan Ganz catheter with unclear termination, course into the main trunk of the right pulmonary artery is noted. Systemic veins: Normal anatomy. Pulmonary veins: Normal anatomy. Left atrial appendage: Patent. Interatrial septum: No communications. Chamber assessment: Left ventricular dilation with globular remodeling. Right ventricular dilation. Pericardium: No pericardial effusion. Extra Cardiac Findings as per separate reporting. IMPRESSION: 1. Prosthetic Aortic stenosis. Findings pertinent to TAVR procedure are detailed above. Mahesh  Chandrasekhar Electronically Signed: By: Stanly Leavens M.D. On: 04/21/2024 17:04   DG CHEST PORT 1 VIEW Result Date: 04/21/2024 CLINICAL DATA:  Congestive heart failure, malpositioned Swan-Ganz catheter EXAM: PORTABLE CHEST 1 VIEW COMPARISON:  04/21/2024 x-ray and CT FINDINGS: Two frontal views of the chest demonstrate postsurgical changes from median sternotomy and aortic valve replacement. Right-sided PICC tip overlies superior vena cava. Right internal jugular flow directed central venous catheter is unchanged in position, with tip in the region of the distal aspect of the right middle lobe pulmonary artery as noted on earlier CT. Recommend further retraction to a more central location prior to balloon inflation. The cardiac silhouette remains enlarged. Continued findings of pulmonary vascular congestion, pulmonary edema, and bilateral pleural effusions.  The spiculated left apical pulmonary nodule seen on CT is obscured by overlapping shadows. No pneumothorax. IMPRESSION: 1. No change in position of the tip of the flow directed central venous catheter, likely still within the distal aspect of the right middle lobe pulmonary artery. Recommend retraction to a more central location prior to balloon inflation. 2. Stable pulmonary edema and bilateral effusions. 3. The spiculated left apical pulmonary nodule seen on earlier CT is obscured by overlying structures. These results will be called to the ordering clinician or representative by the Radiologist Assistant, and communication documented in the PACS or [employer]. Electronically Signed   By: Ozell Daring M.D.   On: 04/21/2024 18:56   CT Angio Abd/Pel w/ and/or w/o Addendum Date: 04/21/2024 ADDENDUM REPORT: 04/21/2024 16:15 ADDENDUM: These results were called by telephone at the time of interpretation on 04/21/2024 at 4:15 pm to provider KATHRYN THOMPSON , who verbally acknowledged these results. Electronically Signed   By: Wilkie Lent M.D.   On: 04/21/2024 16:15   Result Date: 04/21/2024 CLINICAL DATA:  Severe aortic valvular disease. Preoperative planning prior to TAVR. EXAM: CT ANGIOGRAPHY CHEST, ABDOMEN AND PELVIS TECHNIQUE: Non-contrast CT of the chest was initially obtained. Multidetector CT imaging through the chest, abdomen and pelvis was performed using the standard protocol during bolus administration of intravenous contrast. Multiplanar reconstructed images and MIPs were obtained and reviewed to evaluate the vascular anatomy. RADIATION DOSE REDUCTION: This exam was performed according to the departmental dose-optimization program which includes automated exposure control, adjustment of the mA and/or kV according to patient size and/or use of iterative reconstruction technique.  CONTRAST:  OMNIPAQUE  IOHEXOL  350 MG/ML SOLN COMPARISON:  None Available. FINDINGS: CTA CHEST FINDINGS  Cardiovascular: Conventional 3 vessel arch anatomy. Calcified atherosclerotic plaque results in approximately 40% narrowing of the origin of the left subclavian artery. Patient is status post median sternotomy with evidence of prior mechanical aortic valve replacement. Calcifications are present throughout the coronary arteries. The aortic root measures 3.9 cm at the sinuses of Valsalva. Mildly ectatic but nonaneurysmal ascending thoracic aorta with a maximal diameter of 3.8 cm. Scattered atherosclerotic plaque throughout the aorta. Cardiomegaly. A right IJ vascular sheath conveys a Swan-Ganz catheter into the heart. The tip of the Norva is within the right middle lobe pulmonary artery. Calcifications present throughout the coronary arteries. No pericardial effusion. Right upper extremity PICC present. The tip of the catheter lies in the distal SVC. Mediastinum/Nodes: No enlarged mediastinal, hilar, or axillary lymph nodes. Thyroid gland, trachea, and esophagus demonstrate no significant findings. Lungs/Pleura: Moderate bilateral layering pleural effusions slightly worse on the right than the left. Spiculated nodule in the left lung apex measures 2.6 x 1.7 cm. Advanced centrilobular pulmonary emphysema. Diffuse bronchial wall thickening. Focus of atelectasis versus infiltrate in the posterior right upper lobe just anterior to the minor fissure. Dependent atelectasis in both lower lobes. Musculoskeletal: Healed median sternotomy. No acute fracture or aggressive appearing lytic or blastic osseous lesion. Review of the MIP images confirms the above findings. CTA ABDOMEN AND PELVIS FINDINGS VASCULAR Aorta: Normal in caliber. No aneurysm or dissection. Extensive calcified atherosclerotic plaque. Celiac: Patent without evidence of aneurysm, dissection, vasculitis or significant stenosis. Variant anatomy. The common hepatic artery is replaced to the SMA. SMA: Patent without evidence of aneurysm, dissection, vasculitis or  significant stenosis. Replaced common hepatic artery. Renals: Both renal arteries are patent without evidence of aneurysm, dissection, vasculitis, fibromuscular dysplasia or significant stenosis. IMA: Patent without evidence of aneurysm, dissection, vasculitis or significant stenosis. Inflow: Severe bulky calcified atherosclerotic plaque at the origin of the bilateral common iliac arteries resulting in significant stenosis bilaterally. Stenosis is likely critical on the left and relatively high-grade on the right leaving a luminal diameter of only 5 mm. 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 At least moderate stenosis of the origin of the right internal iliac artery. The external iliac arteries are patent but small in caliber on the left likely due to the more proximal common iliac artery stenosis. Left external iliac artery measures no more than 4 mm. Heavily calcified atherosclerotic plaque also present in the common femoral arteries bilaterally. Surgical changes suggest prior left common femoral cutdown and endarterectomy. Veins: No focal venous abnormality. Review of the MIP images confirms the above findings. NON-VASCULAR Hepatobiliary: No focal liver abnormality is seen. No gallstones, gallbladder wall thickening, or biliary dilatation. Pancreas: Unremarkable. No pancreatic ductal dilatation or surrounding inflammatory changes. Spleen: No splenic injury or perisplenic hematoma. Adrenals/Urinary Tract: Low-attenuation adreniform thickening of both adrenal glands consistent with adrenal hyperplasia. There is also likely some component of benign adenoma as well on the left. No hydronephrosis, nephrolithiasis or enhancing renal mass in either kidney. The ureters and bladder are unremarkable. Stomach/Bowel: Colonic diverticular disease without CT evidence of active inflammation. No focal bowel wall thickening or evidence of obstruction. Lymphatic: No suspicious lymphadenopathy. Reproductive:  Prostatomegaly with indentation of the bladder base. Other: No abdominal wall hernia or abnormality. No abdominopelvic ascites. Musculoskeletal: No acute fracture or aggressive appearing lytic or blastic osseous lesion. Multilevel degenerative changes. Review of the MIP images confirms the above findings. IMPRESSION: VASCULAR 1.  The tip of the Swan-Ganz catheter is fairly distal in the right middle lobe pulmonary artery. Recommend pulling back before inflating the balloon to prevent injury to the right middle lobe pulmonary artery. 2. Severe heavily calcified atherosclerotic plaque in the bilateral common iliac arteries resulting in critical stenosis on the left and at least moderate stenosis on the right. 3. The left external iliac artery is relatively small in caliber with a lumen measuring only 4 mm. 4. Bulky calcified atherosclerotic plaque as well in the bilateral common femoral arteries although there are surgical changes suggesting prior left common femoral endarterectomy with a trace residual non flow limiting dissection flap. 5. Surgical changes of prior mechanical aortic valve replacement. The aortic root is normal in caliber at 3.9 cm. 6. Mild ectasia of the ascending thoracic aorta without aneurysm. Maximal diameter 3.7 cm. 7. Right upper extremity PICC is well position with the tip in the mid SVC. 8. Mild to moderate stenosis of the origin of the left subclavian artery due to calcified atherosclerotic plaque. 9. Incidental note is made of replaced common hepatic artery to the SMA. NON VASCULAR 1. Spiculated left apical pulmonary nodule measuring up to 2.6 cm. Differential considerations include primary bronchogenic carcinoma versus masslike pleuroparenchymal scarring. Recommend further evaluation with nonemergent PET-CT. If hypermetabolic, then recommend referral to the multi disciplinary thoracic oncology conference. 2. Moderate bilateral layering pleural effusions larger on the right than the left. 3.  Focal dependent atelectasis versus infiltrate within the posterior right upper lobe. 4. Advanced centrilobular pulmonary emphysema and diffuse chronic bronchial wall thickening. Findings suggest underlying COPD. 5. Prostatomegaly with indentation of the bladder base. Malignancy is difficult to exclude entirely. Recommend non emergent urology referral for cystoscopy and further evaluation in the future. 6. Additional ancillary findings as above. Electronically Signed: By: Wilkie Lent M.D. On: 04/21/2024 15:47   CT ANGIO CHEST AORTA W/CM & OR WO/CM Addendum Date: 04/21/2024 ADDENDUM REPORT: 04/21/2024 16:15 ADDENDUM: These results were called by telephone at the time of interpretation on 04/21/2024 at 4:15 pm to provider KATHRYN THOMPSON , who verbally acknowledged these results. Electronically Signed   By: Wilkie Lent M.D.   On: 04/21/2024 16:15   Result Date: 04/21/2024 CLINICAL DATA:  Severe aortic valvular disease. Preoperative planning prior to TAVR. EXAM: CT ANGIOGRAPHY CHEST, ABDOMEN AND PELVIS TECHNIQUE: Non-contrast CT of the chest was initially obtained. Multidetector CT imaging through the chest, abdomen and pelvis was performed using the standard protocol during bolus administration of intravenous contrast. Multiplanar reconstructed images and MIPs were obtained and reviewed to evaluate the vascular anatomy. RADIATION DOSE REDUCTION: This exam was performed according to the departmental dose-optimization program which includes automated exposure control, adjustment of the mA and/or kV according to patient size and/or use of iterative reconstruction technique. CONTRAST:  OMNIPAQUE  IOHEXOL  350 MG/ML SOLN COMPARISON:  None Available. FINDINGS: CTA CHEST FINDINGS Cardiovascular: Conventional 3 vessel arch anatomy. Calcified atherosclerotic plaque results in approximately 40% narrowing of the origin of the left subclavian artery. Patient is status post median sternotomy with evidence of  prior mechanical aortic valve replacement. Calcifications are present throughout the coronary arteries. The aortic root measures 3.9 cm at the sinuses of Valsalva. Mildly ectatic but nonaneurysmal ascending thoracic aorta with a maximal diameter of 3.8 cm. Scattered atherosclerotic plaque throughout the aorta. Cardiomegaly. A right IJ vascular sheath conveys a Swan-Ganz catheter into the heart. The tip of the Norva is within the right middle lobe pulmonary artery. Calcifications present throughout the coronary arteries. No pericardial  effusion. Right upper extremity PICC present. The tip of the catheter lies in the distal SVC. Mediastinum/Nodes: No enlarged mediastinal, hilar, or axillary lymph nodes. Thyroid gland, trachea, and esophagus demonstrate no significant findings. Lungs/Pleura: Moderate bilateral layering pleural effusions slightly worse on the right than the left. Spiculated nodule in the left lung apex measures 2.6 x 1.7 cm. Advanced centrilobular pulmonary emphysema. Diffuse bronchial wall thickening. Focus of atelectasis versus infiltrate in the posterior right upper lobe just anterior to the minor fissure. Dependent atelectasis in both lower lobes. Musculoskeletal: Healed median sternotomy. No acute fracture or aggressive appearing lytic or blastic osseous lesion. Review of the MIP images confirms the above findings. CTA ABDOMEN AND PELVIS FINDINGS VASCULAR Aorta: Normal in caliber. No aneurysm or dissection. Extensive calcified atherosclerotic plaque. Celiac: Patent without evidence of aneurysm, dissection, vasculitis or significant stenosis. Variant anatomy. The common hepatic artery is replaced to the SMA. SMA: Patent without evidence of aneurysm, dissection, vasculitis or significant stenosis. Replaced common hepatic artery. Renals: Both renal arteries are patent without evidence of aneurysm, dissection, vasculitis, fibromuscular dysplasia or significant stenosis. IMA: Patent without evidence of  aneurysm, dissection, vasculitis or significant stenosis. Inflow: Severe bulky calcified atherosclerotic plaque at the origin of the bilateral common iliac arteries resulting in significant stenosis bilaterally. Stenosis is likely critical on the left and relatively high-grade on the right leaving a luminal diameter of only 5 mm. 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 At least moderate stenosis of the origin of the right internal iliac artery. The external iliac arteries are patent but small in caliber on the left likely due to the more proximal common iliac artery stenosis. Left external iliac artery measures no more than 4 mm. Heavily calcified atherosclerotic plaque also present in the common femoral arteries bilaterally. Surgical changes suggest prior left common femoral cutdown and endarterectomy. Veins: No focal venous abnormality. Review of the MIP images confirms the above findings. NON-VASCULAR Hepatobiliary: No focal liver abnormality is seen. No gallstones, gallbladder wall thickening, or biliary dilatation. Pancreas: Unremarkable. No pancreatic ductal dilatation or surrounding inflammatory changes. Spleen: No splenic injury or perisplenic hematoma. Adrenals/Urinary Tract: Low-attenuation adreniform thickening of both adrenal glands consistent with adrenal hyperplasia. There is also likely some component of benign adenoma as well on the left. No hydronephrosis, nephrolithiasis or enhancing renal mass in either kidney. The ureters and bladder are unremarkable. Stomach/Bowel: Colonic diverticular disease without CT evidence of active inflammation. No focal bowel wall thickening or evidence of obstruction. Lymphatic: No suspicious lymphadenopathy. Reproductive: Prostatomegaly with indentation of the bladder base. Other: No abdominal wall hernia or abnormality. No abdominopelvic ascites. Musculoskeletal: No acute fracture or aggressive appearing lytic or blastic osseous lesion. Multilevel  degenerative changes. Review of the MIP images confirms the above findings. IMPRESSION: VASCULAR 1. The tip of the Swan-Ganz catheter is fairly distal in the right middle lobe pulmonary artery. Recommend pulling back before inflating the balloon to prevent injury to the right middle lobe pulmonary artery. 2. Severe heavily calcified atherosclerotic plaque in the bilateral common iliac arteries resulting in critical stenosis on the left and at least moderate stenosis on the right. 3. The left external iliac artery is relatively small in caliber with a lumen measuring only 4 mm. 4. Bulky calcified atherosclerotic plaque as well in the bilateral common femoral arteries although there are surgical changes suggesting prior left common femoral endarterectomy with a trace residual non flow limiting dissection flap. 5. Surgical changes of prior mechanical aortic valve replacement. The aortic root is normal  in caliber at 3.9 cm. 6. Mild ectasia of the ascending thoracic aorta without aneurysm. Maximal diameter 3.7 cm. 7. Right upper extremity PICC is well position with the tip in the mid SVC. 8. Mild to moderate stenosis of the origin of the left subclavian artery due to calcified atherosclerotic plaque. 9. Incidental note is made of replaced common hepatic artery to the SMA. NON VASCULAR 1. Spiculated left apical pulmonary nodule measuring up to 2.6 cm. Differential considerations include primary bronchogenic carcinoma versus masslike pleuroparenchymal scarring. Recommend further evaluation with nonemergent PET-CT. If hypermetabolic, then recommend referral to the multi disciplinary thoracic oncology conference. 2. Moderate bilateral layering pleural effusions larger on the right than the left. 3. Focal dependent atelectasis versus infiltrate within the posterior right upper lobe. 4. Advanced centrilobular pulmonary emphysema and diffuse chronic bronchial wall thickening. Findings suggest underlying COPD. 5. Prostatomegaly  with indentation of the bladder base. Malignancy is difficult to exclude entirely. Recommend non emergent urology referral for cystoscopy and further evaluation in the future. 6. Additional ancillary findings as above. Electronically Signed: By: Wilkie Lent M.D. On: 04/21/2024 15:47   DG CHEST PORT 1 VIEW Result Date: 04/21/2024 EXAM: 1 VIEW XRAY OF THE CHEST 04/21/2024 09:17:00 AM COMPARISON: 04/20/2024 CLINICAL HISTORY: CHF; swan position FINDINGS: LINES, TUBES AND DEVICES: Right IJ Swan-Ganz catheter in place with tip terminating over the right lower lobe pulmonary artery, recommend retraction. Right PICC in place with tip terminating over the superior cavoatrial junction, unchanged. Cardiac monitoring leads noted. LUNGS AND PLEURA: Diffuse interstitial opacities. Bibasilar patchy opacities. Bilateral pleural effusions, layering. No pneumothorax. HEART AND MEDIASTINUM: Cardiomegaly, unchanged. Median sternotomy and cardiac valve replacement noted. Atherosclerotic calcifications of the aorta. BONES AND SOFT TISSUES: No acute osseous abnormality. IMPRESSION: 1. Right IJ Swan-Ganz catheter with tip in the right lower lobe pulmonary artery, recommend retraction. 2. Pulmonary edema, similar to prior. 3. Similar bibasilar and left retrocardiac opacities. 4. Bilateral layering pleural effusions. 5. Right PICC with tip at the superior cavoatrial junction, unchanged. Electronically signed by: Donnice Mania MD 04/21/2024 02:06 PM EDT RP Workstation: [id number]   CARDIAC CATHETERIZATION Result Date: 04/20/2024   RPAV lesion is 95% stenosed. 1. 95% stenosis in a large PLV vessel (the RCA has a relatively early bifurcation). 2. Low RA pressure but elevated PCWP. 3. Mild pulmonary venous hypertension. 4. Cardiac output adequate on dobutamine  + norepinephrine .   DG CHEST PORT 1 VIEW Result Date: 04/20/2024 CLINICAL DATA:  Shortness of breath EXAM: PORTABLE CHEST 1 VIEW COMPARISON:  Chest radiograph dated  04/19/2024 FINDINGS: Lines/tubes: Right upper extremity PICC tip projects over the SVC. Lungs: Well inflated lungs. Increased diffuse interstitial opacities and bibasilar patchy and dense left retrocardiac opacity. Pleura: Slightly increased small bilateral pleural effusions. No pneumothorax. Heart/mediastinum: Similar enlarged, postsurgical cardiomediastinal silhouette. Bones: Median sternotomy wires are nondisplaced. Degenerative changes of the right shoulder. IMPRESSION: 1. Interval placement of right upper extremity PICC with tip projecting over the SVC. No pneumothorax. 2. Increased pulmonary edema. 3. Slightly increased small bilateral pleural effusions. 4. Bibasilar patchy and dense left retrocardiac opacity, likely a combination of pulmonary edema and atelectasis. Electronically Signed   By: Limin  Xu M.D.   On: 04/20/2024 09:30   US  EKG SITE RITE Result Date: 04/19/2024 If Site Rite image not attached, placement could not be confirmed due to current cardiac rhythm.  ECHOCARDIOGRAM COMPLETE Result Date: 04/19/2024    ECHOCARDIOGRAM REPORT   Patient Name:   [name] Date of Exam: 04/19/2024 Medical Rec #:  [id number]  Height:       70.0 in Accession #:    [id number]      Weight:       169.0 lb Date of Birth:  1956/04/27       BSA:          1.943 m Patient Age:    68 years        BP:           78/59 mmHg Patient Gender: M               HR:           71 bpm. Exam Location:  Inpatient Procedure: 2D Echo, Cardiac Doppler and Color Doppler (Both Spectral and Color            Flow Doppler were utilized during procedure). Indications:    CHF-Acute Systolic I50.21  History:        Patient has prior history of Echocardiogram examinations, most                 recent 10/22/2023. CHF and Cardiomyopathy, PAD and COPD,                 Arrythmias:Atrial Fibrillation and Atrial Flutter,                 Signs/Symptoms:Shortness of Breath; Risk Factors:Dyslipidemia.                 Aortic Valve: 25 mm  Edwards valve is present in the aortic                 position. Procedure Date: 2017.  Sonographer:    Thea Norlander RCS Referring Phys: [id number] SHENG L HALEY IMPRESSIONS  1. Left ventricular ejection fraction, by estimation, is 20 to 25%. The left ventricle has severely decreased function. The left ventricle demonstrates regional wall motion abnormalities (see scoring diagram/findings for description). The left ventricular internal cavity size was moderately to severely dilated. There is mild concentric left ventricular hypertrophy. Left ventricular diastolic parameters are indeterminate.  2. Right ventricular systolic function is normal. The right ventricular size is normal. Tricuspid regurgitation signal is inadequate for assessing PA pressure.  3. Left atrial size was severely dilated.  4. Right atrial size was mild to moderately dilated.  5. The mitral valve is degenerative. Moderate to severe mitral valve regurgitation. No evidence of mitral stenosis.  6. 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 Details below.  7. The aortic valve was not well visualized. Aortic valve regurgitation is mild to moderate. There is a 25 mm Edwards valve present in the aortic position. Procedure Date: 2017. Echo findings are consistent with stenosis of the aortic prosthesis. Aortic  valve area, by VTI measures 0.61 cm. Aortic valve mean gradient measures 49.0 mmHg. Aortic valve Vmax measures 4.72 m/s. Aortic valve acceleration time measures 169 msec.  8. Aortic dilatation noted. There is borderline dilatation of the aortic root, measuring 37 mm.  9. The inferior vena cava is normal in size with greater than 50% respiratory variability, suggesting right atrial pressure of 3 mmHg. 10. Cannot exclude a small PFO. Comparison(s): A prior study was performed on 10/22/2023. The estimated ejection fraction was 45-50% with hypokinesis of inferior and posterior walls. There was grade II diastolic dysfunction with  elevated LAP. There was moderate biatrial enlargement. 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 FINDINGS  Left Ventricle: Left ventricular ejection fraction, by  estimation, is 20 to 25%. The left ventricle has severely decreased function. The left ventricle demonstrates regional wall motion abnormalities. The left ventricular internal cavity size was moderately to severely dilated. There is mild concentric left ventricular hypertrophy. Left ventricular diastolic parameters are indeterminate.  LV Wall Scoring: The entire inferior wall and posterior wall are hypokinetic. Right Ventricle: The right ventricular size is normal. No increase in right ventricular wall thickness. Right ventricular systolic function is normal. Tricuspid regurgitation signal is inadequate for assessing PA pressure. Left Atrium: Left atrial size was severely dilated. Right Atrium: Right atrial size was mild to moderately dilated. Pericardium: There is no evidence of pericardial effusion. Mitral Valve: The mitral valve is degenerative in appearance. Mild mitral annular calcification. Moderate to severe mitral valve regurgitation. No evidence of mitral valve stenosis. Tricuspid Valve: The tricuspid valve is normal in structure. Tricuspid valve regurgitation is trivial. No evidence of tricuspid stenosis. Aortic Valve: The aortic valve was not well visualized. Aortic valve regurgitation is mild to moderate. Aortic valve mean gradient measures 49.0 mmHg. Aortic valve peak gradient measures 88.9 mmHg. Aortic valve area, by VTI measures 0.61 cm. There is a 25 mm Edwards valve present in the aortic position. Procedure Date: 2017. Pulmonic Valve: The pulmonic valve was normal in structure. Pulmonic valve regurgitation is trivial. No evidence of pulmonic stenosis. Aorta: Aortic dilatation noted. There is borderline dilatation of the aortic root, measuring 37 mm. Venous: The inferior vena cava  is normal in size with greater than 50% respiratory variability, suggesting right atrial pressure of 3 mmHg. IAS/Shunts: Cannot exclude a small PFO. 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 Details below.  LEFT VENTRICLE PLAX 2D LVIDd:         6.60 cm   Diastology LVIDs:         5.30 cm   LV e' medial:    4.03 cm/s LV PW:         1.10 cm   LV E/e' medial:  30.5 LV IVS:        1.10 cm   LV e' lateral:   9.14 cm/s LVOT diam:     2.30 cm   LV E/e' lateral: 13.5 LV SV:         68 LV SV Index:   35 LVOT Area:     4.15 cm  RIGHT VENTRICLE             IVC RV S prime:     11.70 cm/s  IVC diam: 1.90 cm TAPSE (M-mode): 2.2 cm LEFT ATRIUM              Index        RIGHT ATRIUM           Index LA diam:        4.10 cm  2.11 cm/m   RA Area:     21.40 cm LA Vol (A2C):   104.0 ml 53.52 ml/m  RA Volume:   62.70 ml  32.27 ml/m LA Vol (A4C):   106.0 ml 54.55 ml/m LA Biplane Vol: 106.0 ml 54.55 ml/m  AORTIC VALVE AV Area (Vmax):    0.60 cm AV Area (Vmean):   0.60 cm AV Area (VTI):     0.61 cm AV Vmax:           471.50 cm/s AV Vmean:          323.000 cm/s AV VTI:            1.115  m AV Peak Grad:      88.9 mmHg AV Mean Grad:      49.0 mmHg LVOT Vmax:         68.00 cm/s LVOT Vmean:        46.900 cm/s LVOT VTI:          0.164 m LVOT/AV VTI ratio: 0.15  AORTA Ao Root diam: 3.70 cm Ao Asc diam:  3.50 cm MITRAL VALVE MV Area (PHT): 5.02 cm     SHUNTS MV Decel Time: 151 msec     Systemic VTI:  0.16 m MV E velocity: 123.00 cm/s  Systemic Diam: 2.30 cm Emeline Calender Electronically signed by Emeline Calender Signature Date/Time: 04/19/2024/7:22:45 PM    Final    DG Chest 2 View Result Date: 04/19/2024 EXAM: 2 VIEW(S) XRAY OF THE CHEST 04/19/2024 12:14:00 PM COMPARISON: 08/26/2023 CLINICAL HISTORY: shob. SOB FINDINGS: LUNGS AND PLEURA: Chronic coarsened interstitial markings with superimposed increased interstitial markings identified. Bibasilar opacities are new from the previous exam and may  reflect atelectasis or airspace disease. No pulmonary edema. No pleural effusion. No pneumothorax. HEART AND MEDIASTINUM: Status post median sternotomy. Prosthetic aortic valve. Aortic atherosclerotic calcification. BONES AND SOFT TISSUES: No acute osseous abnormality. IMPRESSION: 1. New bilateral small pleural effusions with mild interstitial edema concerning for chf. 2. New bibasilar opacities, possibly representing atelectasis or airspace disease. 3. Aortic atherosclerotic calcification. Electronically signed by: Waddell Calk MD 04/19/2024 01:38 PM EDT RP Workstation: [id number]    ASSESSMENT & PLAN KIVON APREA is a 68 y.o. male who presents to the rapid diagnostic clinic for evaluation of left pulmonary nodule.    #Left apical lung nodule: --Differentials include bronchogenic primary, metastatic lung lesion and benign lesion --Referral to pulmonology for evaluation of EBUS with biopsy --Recommend PET/CT scan for further evaluation and staging, scheduled for 05/14/2024 --Labs today to check CBC, CMP, LDH, ESR and sed rate.   --RTC once workup is complete   Orders Placed This Encounter  Procedures   NM PET Image Initial (PI) Skull Base To Thigh    Standing Status:   Future    Expected Date:   05/07/2024    Expiration Date:   05/06/2025    If indicated for the ordered procedure, I authorize the administration of a radiopharmaceutical per Radiology protocol:   Yes    Preferred imaging location?:   [place]   CBC with Differential (Cancer Center Only)    Standing Status:   Future    Number of Occurrences:   1    Expiration Date:   05/06/2025   CMP (Cancer Center only)    Standing Status:   Future    Number of Occurrences:   1    Expiration Date:   05/06/2025   Lactate dehydrogenase (LDH)    Standing Status:   Future    Number of Occurrences:   1    Expiration Date:   05/06/2025   Sedimentation rate    Standing Status:   Future    Number of Occurrences:   1    Expiration Date:    05/06/2025   C-reactive protein    Standing Status:   Future    Number of Occurrences:   1    Expiration Date:   05/06/2025   Ambulatory referral to Pulmonology    Referral Priority:   Urgent    Referral Type:   Consultation    Referral Reason:   Specialty Services Required    Requested Specialty:  Pulmonary Disease    Number of Visits Requested:   1    All questions were answered. The patient knows to call the clinic with any problems, questions or concerns.  I have spent a total of 60 minutes minutes of face-to-face and non-face-to-face time, preparing to see the patient, obtaining and/or reviewing separately obtained history, performing a medically appropriate examination, counseling and educating the patient, ordering medications/tests/procedures, referring and communicating with other health care professionals, documenting clinical information in the electronic health record, independently interpreting results and communicating results to the patient, and care coordination.   Johnston Police, PA-C Department of Hematology/Oncology [facility] at [facility] Phone: [phone]  Patient was seen with Dr. Federico  I have read the above note and personally examined the patient. I agree with the assessment and plan as noted above.  Briefly Mr. Alexandro Line is a 68 year old male who presents for evaluation of a 2.6 cm pulmonary nodule.  This was found incidentally during a workup for a TAVR.  Patient reports he is otherwise asymptomatic.  He is not having any chest pain, shortness of breath, or productive cough.  He has had prior chest x-rays which showed no concerning abnormalities, additionally he has had no recent unexpected weight loss, fevers, chills, sweats.  At this time recommend a PET CT scan in order to determine if this is an FDG avid lesion.  Additionally would recommend consult to pulmonary to see if EBUS biopsy would be feasible.  The patient voiced  understanding of our findings and recommendations moving forward.   Norleen IVAR Federico, MD Department of Hematology/Oncology [facility] at [facility] Phone: [phone] Pager: [phone] Email: [email or web address]

## 2024-05-06 ENCOUNTER — Inpatient Hospital Stay

## 2024-05-06 ENCOUNTER — Inpatient Hospital Stay: Attending: Physician Assistant | Admitting: Physician Assistant

## 2024-05-06 ENCOUNTER — Encounter: Payer: Self-pay | Admitting: Medical Oncology

## 2024-05-06 ENCOUNTER — Encounter: Payer: Self-pay | Admitting: Physician Assistant

## 2024-05-06 ENCOUNTER — Telehealth: Payer: Self-pay

## 2024-05-06 VITALS — BP 104/63 | HR 70 | Temp 98.6°F | Resp 16 | Ht 70.0 in | Wt 157.4 lb

## 2024-05-06 DIAGNOSIS — R911 Solitary pulmonary nodule: Secondary | ICD-10-CM

## 2024-05-06 DIAGNOSIS — R918 Other nonspecific abnormal finding of lung field: Secondary | ICD-10-CM | POA: Diagnosis present

## 2024-05-06 LAB — CBC WITH DIFFERENTIAL (CANCER CENTER ONLY)
Abs Immature Granulocytes: 0.03 K/uL (ref 0.00–0.07)
Basophils Absolute: 0.1 K/uL (ref 0.0–0.1)
Basophils Relative: 1 %
Eosinophils Absolute: 0 K/uL (ref 0.0–0.5)
Eosinophils Relative: 0 %
HCT: 42.4 % (ref 39.0–52.0)
Hemoglobin: 14.2 g/dL (ref 13.0–17.0)
Immature Granulocytes: 0 %
Lymphocytes Relative: 12 %
Lymphs Abs: 1.3 K/uL (ref 0.7–4.0)
MCH: 32.8 pg (ref 26.0–34.0)
MCHC: 33.5 g/dL (ref 30.0–36.0)
MCV: 97.9 fL (ref 80.0–100.0)
Monocytes Absolute: 0.7 K/uL (ref 0.1–1.0)
Monocytes Relative: 7 %
Neutro Abs: 8.2 K/uL — ABNORMAL HIGH (ref 1.7–7.7)
Neutrophils Relative %: 80 %
Platelet Count: 175 K/uL (ref 150–400)
RBC: 4.33 MIL/uL (ref 4.22–5.81)
RDW: 13.2 % (ref 11.5–15.5)
WBC Count: 10.3 K/uL (ref 4.0–10.5)
nRBC: 0 % (ref 0.0–0.2)

## 2024-05-06 LAB — CMP (CANCER CENTER ONLY)
ALT: 22 U/L (ref 0–44)
AST: 25 U/L (ref 15–41)
Albumin: 4.5 g/dL (ref 3.5–5.0)
Alkaline Phosphatase: 52 U/L (ref 38–126)
Anion gap: 7 (ref 5–15)
BUN: 25 mg/dL — ABNORMAL HIGH (ref 8–23)
CO2: 31 mmol/L (ref 22–32)
Calcium: 9.9 mg/dL (ref 8.9–10.3)
Chloride: 101 mmol/L (ref 98–111)
Creatinine: 0.77 mg/dL (ref 0.61–1.24)
GFR, Estimated: >60 mL/min (ref >60)
Glucose, Bld: 95 mg/dL (ref 70–99)
Potassium: 4.1 mmol/L (ref 3.5–5.1)
Sodium: 139 mmol/L (ref 135–145)
Total Bilirubin: 0.5 mg/dL (ref 0.0–1.2)
Total Protein: 7.9 g/dL (ref 6.5–8.1)

## 2024-05-06 LAB — LACTATE DEHYDROGENASE: LDH: 332 U/L — ABNORMAL HIGH (ref 98–192)

## 2024-05-06 LAB — SEDIMENTATION RATE: Sed Rate: 30 mm/h — ABNORMAL HIGH (ref 0–16)

## 2024-05-06 LAB — C-REACTIVE PROTEIN: CRP: 0.8 mg/dL (ref <1.0)

## 2024-05-06 NOTE — Telephone Encounter (Signed)
 Patient returned call and left VM. Per pt's chart he has already been scheduled for a consult on 05/13/24 with Dr. Zaida. Pt has his PET on 05/14/2024. There are no sooner appts with the other providers. Called patient back and advised him to keep his upcoming appt with Dr. Zaida. Pt verbalized understanding and denied any further questions or concerns at this time.

## 2024-05-06 NOTE — Progress Notes (Signed)
 [facility]  Patient presented to clinic, alone, for his scheduled appointment with PA-C Johnston. I introduced myself and provided them with my direct contact information. Patient was encouraged to call me with any questions/concerns he may have.   Patient scheduled for a NM PET 05/14/24. Patient informed to arrive at 4 PM for a 4:30 appointment at [facility]. Patient also informed nothing to eat or drink six hours prior to appointment and may have water.   Colene KYM Raider, RN, BSN, [facility] Oncology Nurse Navigator, [facility] 05/06/2024 10:07 AM

## 2024-05-06 NOTE — Telephone Encounter (Signed)
 Per Lauraine in secure chat,  can we get this patient scheduled for a consult with either Dr. Shelah , any of the Interventional Pulmonologist's, or me, whoever has the earliest opening. Thanks   I spoke with the patient earlier and he was having lab work done. Advised I would call back later. Pt is aware that we are trying to schedule a NP appt. Per Lauraine the PET does not need to be resulted prior to appt. Please advise patient when he calls. I have left a VM to schedule appt and advise on PET.

## 2024-05-12 ENCOUNTER — Other Ambulatory Visit: Payer: Self-pay | Admitting: Internal Medicine

## 2024-05-12 DIAGNOSIS — I480 Paroxysmal atrial fibrillation: Secondary | ICD-10-CM

## 2024-05-13 ENCOUNTER — Ambulatory Visit

## 2024-05-13 ENCOUNTER — Encounter: Payer: Self-pay | Admitting: Physician Assistant

## 2024-05-13 ENCOUNTER — Telehealth: Payer: Self-pay

## 2024-05-13 VITALS — BP 94/60 | HR 59 | Ht 70.0 in | Wt 159.6 lb

## 2024-05-13 DIAGNOSIS — J4489 Other specified chronic obstructive pulmonary disease: Secondary | ICD-10-CM

## 2024-05-13 DIAGNOSIS — R911 Solitary pulmonary nodule: Secondary | ICD-10-CM | POA: Diagnosis not present

## 2024-05-13 NOTE — H&P (View-Only) (Signed)
 Subjective:   PATIENT ID: [name] Alert GENDER: male DOB: 06-28-1956, MRN: [id number]   HPI Discussed the use of AI scribe software for clinical note transcription with the patient, who gave verbal consent to proceed.  History of Present Illness [name] is a 68 year old male with COPD who presents for evaluation of a lung nodule.  He was recently hospitalized and discharged on November 4th after undergoing aortic valve replacement due to a malfunctioning valve and heart strain. During his hospital stay, a chest scan revealed a nodule in the left upper lung. He is scheduled for a PET scan to assess the activity of the lung nodule.  He has a history of smoking but has quit since his recent hospitalization. A CT scan showed evidence of lung damage, and the notes mention that the patient has a history of COPD. He has an inhaler, albuterol  sulfate, which he last used on October 20th during an episode of breathlessness when he was suspected of having a heart attack. No current shortness of breath or excessive phlegm production.  He is on Eliquis for atrial fibrillation, which he needs to stop 48 hours before any procedure. He denies taking other blood thinners or aspirin  and occasionally uses Tylenol .  I reached out to his cardiology team regarding stopping the Eliquis given that he just had a valve in valve TAVR.  His family history is significant for his wife's recent death from cancer, which started in the right lung or liver and metastasized over nine months.     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 Wendel and Dr. Daniel   Severe aortic stenosis      Family History  Problem Relation Age of Onset   Breast cancer Mother    Breast cancer Sister      Social History   Socioeconomic History   Marital status: Married    Spouse name: Not on file   Number of children: 4   Years of education: 10   Highest education level: Not on file  Occupational History   Occupation: holiday representative    Comment: Engineer, Mining  Tobacco Use   Smoking status: Former    Current packs/day: 0.00    Average packs/day: 1 pack/day for 48.0 years (48.0 ttl pk-yrs)    Types: Cigarettes    Start date: 01/23/1971    Quit date: 2025    Years since quitting: 0.8   Smokeless tobacco: Former   Tobacco comments:    Quit 04/19/24   Vaping Use   Vaping status: Former   Quit date: 08/28/2015  Substance and Sexual Activity   Alcohol use: Not Currently    Alcohol/week: 4.0 standard drinks of alcohol    Types: 4 Standard drinks or equivalent per week    Comment: beer   Drug use: No   Sexual activity: Not Currently  Other Topics Concern  Not on file  Social History Narrative   Not on file   Social Drivers of Health   Financial Resource Strain: Not on file  Food Insecurity: No Food Insecurity (05/06/2024)   Hunger Vital Sign    Worried About Running Out of Food in the Last Year: Never true    Ran Out of Food in the Last Year: Never true  Transportation Needs: No Transportation Needs (05/06/2024)   PRAPARE - Administrator, Civil Service (Medical): No    Lack of Transportation (Non-Medical): No  Physical Activity: Not on file  Stress: Not on file  Social Connections: Socially Isolated (04/19/2024)   Social Connection and Isolation Panel    Frequency of Communication with Friends and Family: More than three times a week    Frequency of Social  Gatherings with Friends and Family: Twice a week    Attends Religious Services: Never    Database Administrator or Organizations: No    Attends Banker Meetings: Never    Marital Status: Widowed  Intimate Partner Violence: Not At Risk (05/06/2024)   Humiliation, Afraid, Rape, and Kick questionnaire    Fear of Current or Ex-Partner: No    Emotionally Abused: No    Physically Abused: No    Sexually Abused: No     Allergies  Allergen Reactions   No Known Allergies      Outpatient Medications Prior to Visit  Medication Sig Dispense Refill   acetaminophen  (TYLENOL ) 500 MG tablet Take 1,000 mg by mouth daily as needed for headache.     albuterol  (VENTOLIN  HFA) 108 (90 Base) MCG/ACT inhaler Inhale 2 puffs into the lungs every 4 (four) hours as needed for wheezing or shortness of breath.     apixaban (ELIQUIS) 5 MG TABS tablet Take 1 tablet (5 mg total) by mouth 2 (two) times daily. 60 tablet 6   atorvastatin  (LIPITOR) 10 MG tablet TAKE 1 TABLET BY MOUTH EVERY DAY 90 tablet 3   empagliflozin  (JARDIANCE ) 10 MG TABS tablet Take 1 tablet (10 mg total) by mouth daily before breakfast. 30 tablet 11   finasteride  (PROSCAR ) 5 MG tablet Take 1 tablet (5 mg total) by mouth daily. 30 tablet 1   spironolactone (ALDACTONE) 25 MG tablet Take 1 tablet (25 mg total) by mouth daily. 30 tablet 6   tamsulosin  (FLOMAX ) 0.4 MG CAPS capsule Take 0.4 mg by mouth every evening.  1   torsemide (DEMADEX) 20 MG tablet Take 1 tablet (20 mg total) by mouth daily. 30 tablet 6   No facility-administered medications prior to visit.    ROS Reviewed all systems and reported negative except as above     Objective:   Vitals:   05/13/24 1357  BP: 94/60  Pulse: (!) 59  SpO2: 96%  Weight: 159 lb 9.6 oz (72.4 kg)  Height: 5' 10 (1.778 m)    Physical Exam Physical Exam GENERAL: Appropriate to age, no acute distress. HEAD EYES EARS NOSE THROAT: Moist mucous membranes, atraumatic,  normocephalic. CHEST: Clear to auscultation bilaterally, no wheezing, no crackles, no rales. CARDIAC: Regular rate and rhythm, normal S1, normal S2, no murmurs, no rubs, no gallops. ABDOMEN: Soft, nontender. NEUROLOGICAL: Motor and sensation grossly intact, alert and oriented times X 3. EXTREMITIES: Warm, well perfused, no edema.     CBC    Component Value Date/Time   WBC 10.3 05/06/2024 1001   WBC 6.8 05/02/2024 0340   RBC 4.33 05/06/2024 1001   HGB 14.2 05/06/2024  1001   HCT 42.4 05/06/2024 1001   PLT 175 05/06/2024 1001   MCV 97.9 05/06/2024 1001   MCH 32.8 05/06/2024 1001   MCHC 33.5 05/06/2024 1001   RDW 13.2 05/06/2024 1001   LYMPHSABS 1.3 05/06/2024 1001   MONOABS 0.7 05/06/2024 1001   EOSABS 0.0 05/06/2024 1001   BASOSABS 0.1 05/06/2024 1001     Chest imaging:  PFT:    Latest Ref Rng & Units 01/26/2016    8:34 AM  PFT Results  FVC-Pre L 3.08   FVC-Predicted Pre % 70   FVC-Post L 3.01   FVC-Predicted Post % 68   Pre FEV1/FVC % % 76   Post FEV1/FCV % % 76   FEV1-Pre L 2.35   FEV1-Predicted Pre % 71   FEV1-Post L 2.28   DLCO uncorrected ml/min/mmHg 20.60   DLCO UNC% % 69   DLCO corrected ml/min/mmHg 20.11   DLCO COR %Predicted % 67   DLVA Predicted % 93   TLC L 5.73   TLC % Predicted % 86   RV % Predicted % 94     Labs:    Echo:       Assessment & Plan:   Assessment and Plan Assessment & Plan Solitary pulmonary nodule, left upper lung Solitary pulmonary nodule identified. Differential includes malignancy. PET scan and biopsy planned for further evaluation. Biopsy risks include bleeding, pneumothorax, infection. Radiation therapy considered if cancer confirmed and no metastasis. - Ordered PET scan to assess metabolic activity of the nodule. - Scheduling of biopsy will depend on when he can stop his Eliquis.  I am waiting for his cardiology team to get back to me on when Eliquis can be stopped - Instructed to stop Eliquis 48 hours prior to  biopsy. - Ordered pulmonary function test (PFT) to assess lung function.  Chronic obstructive pulmonary disease (COPD) COPD present with lung damage on CT. Smoking cessation noted. No current symptoms requiring long-acting inhaler. - Monitor for symptoms of shortness of breath or increased phlegm production. - Will consider long-acting inhaler if symptoms develop.  Atrial fibrillation Managed with Eliquis. Coordination with cardiologist needed for biopsy preparation. - Will confirm with cardiologist regarding the safety of stopping Eliquis for 48 hours prior to biopsy.        Zola Herter, MD [facility] Office: [phone]

## 2024-05-13 NOTE — Progress Notes (Signed)
 Subjective:   PATIENT ID: [name] Alert GENDER: male DOB: 06-28-1956, MRN: [id number]   HPI Discussed the use of AI scribe software for clinical note transcription with the patient, who gave verbal consent to proceed.  History of Present Illness [name] is a 68 year old male with COPD who presents for evaluation of a lung nodule.  He was recently hospitalized and discharged on November 4th after undergoing aortic valve replacement due to a malfunctioning valve and heart strain. During his hospital stay, a chest scan revealed a nodule in the left upper lung. He is scheduled for a PET scan to assess the activity of the lung nodule.  He has a history of smoking but has quit since his recent hospitalization. A CT scan showed evidence of lung damage, and the notes mention that the patient has a history of COPD. He has an inhaler, albuterol  sulfate, which he last used on October 20th during an episode of breathlessness when he was suspected of having a heart attack. No current shortness of breath or excessive phlegm production.  He is on Eliquis for atrial fibrillation, which he needs to stop 48 hours before any procedure. He denies taking other blood thinners or aspirin  and occasionally uses Tylenol .  I reached out to his cardiology team regarding stopping the Eliquis given that he just had a valve in valve TAVR.  His family history is significant for his wife's recent death from cancer, which started in the right lung or liver and metastasized over nine months.     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 Wendel and Dr. Daniel   Severe aortic stenosis      Family History  Problem Relation Age of Onset   Breast cancer Mother    Breast cancer Sister      Social History   Socioeconomic History   Marital status: Married    Spouse name: Not on file   Number of children: 4   Years of education: 10   Highest education level: Not on file  Occupational History   Occupation: holiday representative    Comment: Engineer, Mining  Tobacco Use   Smoking status: Former    Current packs/day: 0.00    Average packs/day: 1 pack/day for 48.0 years (48.0 ttl pk-yrs)    Types: Cigarettes    Start date: 01/23/1971    Quit date: 2025    Years since quitting: 0.8   Smokeless tobacco: Former   Tobacco comments:    Quit 04/19/24   Vaping Use   Vaping status: Former   Quit date: 08/28/2015  Substance and Sexual Activity   Alcohol use: Not Currently    Alcohol/week: 4.0 standard drinks of alcohol    Types: 4 Standard drinks or equivalent per week    Comment: beer   Drug use: No   Sexual activity: Not Currently  Other Topics Concern  Not on file  Social History Narrative   Not on file   Social Drivers of Health   Financial Resource Strain: Not on file  Food Insecurity: No Food Insecurity (05/06/2024)   Hunger Vital Sign    Worried About Running Out of Food in the Last Year: Never true    Ran Out of Food in the Last Year: Never true  Transportation Needs: No Transportation Needs (05/06/2024)   PRAPARE - Administrator, Civil Service (Medical): No    Lack of Transportation (Non-Medical): No  Physical Activity: Not on file  Stress: Not on file  Social Connections: Socially Isolated (04/19/2024)   Social Connection and Isolation Panel    Frequency of Communication with Friends and Family: More than three times a week    Frequency of Social  Gatherings with Friends and Family: Twice a week    Attends Religious Services: Never    Database Administrator or Organizations: No    Attends Banker Meetings: Never    Marital Status: Widowed  Intimate Partner Violence: Not At Risk (05/06/2024)   Humiliation, Afraid, Rape, and Kick questionnaire    Fear of Current or Ex-Partner: No    Emotionally Abused: No    Physically Abused: No    Sexually Abused: No     Allergies  Allergen Reactions   No Known Allergies      Outpatient Medications Prior to Visit  Medication Sig Dispense Refill   acetaminophen  (TYLENOL ) 500 MG tablet Take 1,000 mg by mouth daily as needed for headache.     albuterol  (VENTOLIN  HFA) 108 (90 Base) MCG/ACT inhaler Inhale 2 puffs into the lungs every 4 (four) hours as needed for wheezing or shortness of breath.     apixaban (ELIQUIS) 5 MG TABS tablet Take 1 tablet (5 mg total) by mouth 2 (two) times daily. 60 tablet 6   atorvastatin  (LIPITOR) 10 MG tablet TAKE 1 TABLET BY MOUTH EVERY DAY 90 tablet 3   empagliflozin  (JARDIANCE ) 10 MG TABS tablet Take 1 tablet (10 mg total) by mouth daily before breakfast. 30 tablet 11   finasteride  (PROSCAR ) 5 MG tablet Take 1 tablet (5 mg total) by mouth daily. 30 tablet 1   spironolactone (ALDACTONE) 25 MG tablet Take 1 tablet (25 mg total) by mouth daily. 30 tablet 6   tamsulosin  (FLOMAX ) 0.4 MG CAPS capsule Take 0.4 mg by mouth every evening.  1   torsemide (DEMADEX) 20 MG tablet Take 1 tablet (20 mg total) by mouth daily. 30 tablet 6   No facility-administered medications prior to visit.    ROS Reviewed all systems and reported negative except as above     Objective:   Vitals:   05/13/24 1357  BP: 94/60  Pulse: (!) 59  SpO2: 96%  Weight: 159 lb 9.6 oz (72.4 kg)  Height: 5' 10 (1.778 m)    Physical Exam Physical Exam GENERAL: Appropriate to age, no acute distress. HEAD EYES EARS NOSE THROAT: Moist mucous membranes, atraumatic,  normocephalic. CHEST: Clear to auscultation bilaterally, no wheezing, no crackles, no rales. CARDIAC: Regular rate and rhythm, normal S1, normal S2, no murmurs, no rubs, no gallops. ABDOMEN: Soft, nontender. NEUROLOGICAL: Motor and sensation grossly intact, alert and oriented times X 3. EXTREMITIES: Warm, well perfused, no edema.     CBC    Component Value Date/Time   WBC 10.3 05/06/2024 1001   WBC 6.8 05/02/2024 0340   RBC 4.33 05/06/2024 1001   HGB 14.2 05/06/2024  1001   HCT 42.4 05/06/2024 1001   PLT 175 05/06/2024 1001   MCV 97.9 05/06/2024 1001   MCH 32.8 05/06/2024 1001   MCHC 33.5 05/06/2024 1001   RDW 13.2 05/06/2024 1001   LYMPHSABS 1.3 05/06/2024 1001   MONOABS 0.7 05/06/2024 1001   EOSABS 0.0 05/06/2024 1001   BASOSABS 0.1 05/06/2024 1001     Chest imaging:  PFT:    Latest Ref Rng & Units 01/26/2016    8:34 AM  PFT Results  FVC-Pre L 3.08   FVC-Predicted Pre % 70   FVC-Post L 3.01   FVC-Predicted Post % 68   Pre FEV1/FVC % % 76   Post FEV1/FCV % % 76   FEV1-Pre L 2.35   FEV1-Predicted Pre % 71   FEV1-Post L 2.28   DLCO uncorrected ml/min/mmHg 20.60   DLCO UNC% % 69   DLCO corrected ml/min/mmHg 20.11   DLCO COR %Predicted % 67   DLVA Predicted % 93   TLC L 5.73   TLC % Predicted % 86   RV % Predicted % 94     Labs:    Echo:       Assessment & Plan:   Assessment and Plan Assessment & Plan Solitary pulmonary nodule, left upper lung Solitary pulmonary nodule identified. Differential includes malignancy. PET scan and biopsy planned for further evaluation. Biopsy risks include bleeding, pneumothorax, infection. Radiation therapy considered if cancer confirmed and no metastasis. - Ordered PET scan to assess metabolic activity of the nodule. - Scheduling of biopsy will depend on when he can stop his Eliquis.  I am waiting for his cardiology team to get back to me on when Eliquis can be stopped - Instructed to stop Eliquis 48 hours prior to  biopsy. - Ordered pulmonary function test (PFT) to assess lung function.  Chronic obstructive pulmonary disease (COPD) COPD present with lung damage on CT. Smoking cessation noted. No current symptoms requiring long-acting inhaler. - Monitor for symptoms of shortness of breath or increased phlegm production. - Will consider long-acting inhaler if symptoms develop.  Atrial fibrillation Managed with Eliquis. Coordination with cardiologist needed for biopsy preparation. - Will confirm with cardiologist regarding the safety of stopping Eliquis for 48 hours prior to biopsy.        Zola Herter, MD [facility] Office: [phone]

## 2024-05-13 NOTE — Patient Instructions (Addendum)
  VISIT SUMMARY: Today, you were seen for a follow-up on a lung nodule that was discovered during your recent hospitalization for aortic valve replacement. We discussed the next steps for evaluating the nodule and reviewed your current health conditions, including COPD and atrial fibrillation.  YOUR PLAN: -SOLITARY PULMONARY NODULE, LEFT UPPER LUNG: A solitary pulmonary nodule is a small, round growth in the lung that can be benign or malignant. We have ordered a PET scan to assess the activity of the nodule and scheduled a biopsy for November 25th to determine its nature. Please stop taking Eliquis 48 hours before the biopsy. The biopsy carries some risks, including bleeding, lung collapse, and infection. If the nodule is cancerous and has not spread, radiation therapy may be considered.  -CHRONIC OBSTRUCTIVE PULMONARY DISEASE (COPD): COPD is a chronic lung condition that makes it hard to breathe. Your CT scan showed lung damage, but you are not currently experiencing symptoms that require a long-acting inhaler. Please monitor for any new symptoms like shortness of breath or increased phlegm production, and we will consider additional treatment if needed.   INSTRUCTIONS: Please wait for our phone call regarding further instructions about when to do the biopsy. We will also be conducting a PET scan and a pulmonary function test to further evaluate your lung nodule and overall lung function.  Please continue to not smoke cigarettes.

## 2024-05-13 NOTE — Telephone Encounter (Signed)
 Please schedule the following:  Provider performing procedure: Stone Spirito  Diagnosis: Left upper lobe nodule Which side if for nodule / mass? Left side  Procedure: Robotic bronch  Has patient been spoken to by Provider and given informed consent? Yes  Anesthesia: General Do you need Fluro? Yes Duration of procedure: 60 minutes Date: 06/03/24  Time: AM Location: [facility] Does patient have OSA? No DM? No Or Latex allergy? NO Medication Restriction/ Anticoagulate/Antiplatelet: Stop Eliquis 48 hours prior Pre-op Labs Ordered:determined by Anesthesia Imaging request: None  (If, SuperDimension CT Chest, please have STAT courier sent to ENDO)

## 2024-05-14 ENCOUNTER — Ambulatory Visit (HOSPITAL_COMMUNITY)
Admit: 2024-05-14 | Discharge: 2024-05-14 | Disposition: A | Discharge status: Home / Self Care | Source: Ambulatory Visit | Attending: Family Medicine | Admitting: Family Medicine

## 2024-05-14 ENCOUNTER — Encounter (HOSPITAL_COMMUNITY): Payer: Self-pay

## 2024-05-14 ENCOUNTER — Ambulatory Visit (HOSPITAL_COMMUNITY)
Admission: RE | Admit: 2024-05-14 | Discharge: 2024-05-14 | Disposition: A | Discharge status: Home / Self Care | Source: Ambulatory Visit | Attending: Physician Assistant | Admitting: Physician Assistant

## 2024-05-14 ENCOUNTER — Telehealth (HOSPITAL_COMMUNITY): Payer: Self-pay

## 2024-05-14 VITALS — BP 116/60 | HR 66 | Wt 157.0 lb

## 2024-05-14 DIAGNOSIS — I6523 Occlusion and stenosis of bilateral carotid arteries: Secondary | ICD-10-CM | POA: Diagnosis not present

## 2024-05-14 DIAGNOSIS — I350 Nonrheumatic aortic (valve) stenosis: Secondary | ICD-10-CM | POA: Diagnosis not present

## 2024-05-14 DIAGNOSIS — R918 Other nonspecific abnormal finding of lung field: Secondary | ICD-10-CM

## 2024-05-14 DIAGNOSIS — I2510 Atherosclerotic heart disease of native coronary artery without angina pectoris: Secondary | ICD-10-CM | POA: Insufficient documentation

## 2024-05-14 DIAGNOSIS — R911 Solitary pulmonary nodule: Secondary | ICD-10-CM | POA: Insufficient documentation

## 2024-05-14 DIAGNOSIS — N400 Enlarged prostate without lower urinary tract symptoms: Secondary | ICD-10-CM | POA: Insufficient documentation

## 2024-05-14 DIAGNOSIS — I428 Other cardiomyopathies: Secondary | ICD-10-CM | POA: Insufficient documentation

## 2024-05-14 DIAGNOSIS — G4733 Obstructive sleep apnea (adult) (pediatric): Secondary | ICD-10-CM | POA: Diagnosis not present

## 2024-05-14 DIAGNOSIS — T8209XA Other mechanical complication of heart valve prosthesis, initial encounter: Secondary | ICD-10-CM | POA: Diagnosis not present

## 2024-05-14 DIAGNOSIS — I340 Nonrheumatic mitral (valve) insufficiency: Secondary | ICD-10-CM | POA: Diagnosis not present

## 2024-05-14 DIAGNOSIS — Q2381 Bicuspid aortic valve: Secondary | ICD-10-CM | POA: Diagnosis not present

## 2024-05-14 DIAGNOSIS — J439 Emphysema, unspecified: Secondary | ICD-10-CM | POA: Diagnosis not present

## 2024-05-14 DIAGNOSIS — I480 Paroxysmal atrial fibrillation: Secondary | ICD-10-CM | POA: Insufficient documentation

## 2024-05-14 DIAGNOSIS — Z79899 Other long term (current) drug therapy: Secondary | ICD-10-CM | POA: Diagnosis not present

## 2024-05-14 DIAGNOSIS — Z7901 Long term (current) use of anticoagulants: Secondary | ICD-10-CM | POA: Diagnosis not present

## 2024-05-14 DIAGNOSIS — I493 Ventricular premature depolarization: Secondary | ICD-10-CM | POA: Diagnosis not present

## 2024-05-14 DIAGNOSIS — I739 Peripheral vascular disease, unspecified: Secondary | ICD-10-CM | POA: Insufficient documentation

## 2024-05-14 DIAGNOSIS — Z7984 Long term (current) use of oral hypoglycemic drugs: Secondary | ICD-10-CM | POA: Diagnosis not present

## 2024-05-14 DIAGNOSIS — I700 Atherosclerosis of aorta: Secondary | ICD-10-CM | POA: Diagnosis not present

## 2024-05-14 DIAGNOSIS — Y831 Surgical operation with implant of artificial internal device as the cause of abnormal reaction of the patient, or of later complication, without mention of misadventure at the time of the procedure: Secondary | ICD-10-CM | POA: Insufficient documentation

## 2024-05-14 DIAGNOSIS — I5022 Chronic systolic (congestive) heart failure: Secondary | ICD-10-CM | POA: Insufficient documentation

## 2024-05-14 DIAGNOSIS — Z952 Presence of prosthetic heart valve: Secondary | ICD-10-CM | POA: Diagnosis not present

## 2024-05-14 DIAGNOSIS — J449 Chronic obstructive pulmonary disease, unspecified: Secondary | ICD-10-CM | POA: Insufficient documentation

## 2024-05-14 DIAGNOSIS — E278 Other specified disorders of adrenal gland: Secondary | ICD-10-CM | POA: Insufficient documentation

## 2024-05-14 DIAGNOSIS — Z87891 Personal history of nicotine dependence: Secondary | ICD-10-CM | POA: Diagnosis not present

## 2024-05-14 DIAGNOSIS — I110 Hypertensive heart disease with heart failure: Secondary | ICD-10-CM | POA: Diagnosis not present

## 2024-05-14 DIAGNOSIS — Z953 Presence of xenogenic heart valve: Secondary | ICD-10-CM | POA: Diagnosis not present

## 2024-05-14 DIAGNOSIS — Z95820 Peripheral vascular angioplasty status with implants and grafts: Secondary | ICD-10-CM | POA: Insufficient documentation

## 2024-05-14 LAB — BASIC METABOLIC PANEL WITH GFR
Anion gap: 11 (ref 5–15)
BUN: 24 mg/dL — ABNORMAL HIGH (ref 8–23)
CO2: 26 mmol/L (ref 22–32)
Calcium: 9.4 mg/dL (ref 8.9–10.3)
Chloride: 101 mmol/L (ref 98–111)
Creatinine, Ser: 0.76 mg/dL (ref 0.61–1.24)
GFR, Estimated: >60 mL/min (ref >60)
Glucose, Bld: 89 mg/dL (ref 70–99)
Potassium: 4.2 mmol/L (ref 3.5–5.1)
Sodium: 138 mmol/L (ref 135–145)

## 2024-05-14 LAB — CBC
HCT: 42 % (ref 39.0–52.0)
Hemoglobin: 14.2 g/dL (ref 13.0–17.0)
MCH: 33.3 pg (ref 26.0–34.0)
MCHC: 33.8 g/dL (ref 30.0–36.0)
MCV: 98.4 fL (ref 80.0–100.0)
Platelets: 135 K/uL — ABNORMAL LOW (ref 150–400)
RBC: 4.27 MIL/uL (ref 4.22–5.81)
RDW: 13.1 % (ref 11.5–15.5)
WBC: 6.3 K/uL (ref 4.0–10.5)
nRBC: 0 % (ref 0.0–0.2)

## 2024-05-14 LAB — BRAIN NATRIURETIC PEPTIDE: B Natriuretic Peptide: 205.4 pg/mL — ABNORMAL HIGH (ref 0.0–100.0)

## 2024-05-14 LAB — GLUCOSE, CAPILLARY: Glucose-Capillary: 94 mg/dL (ref 70–99)

## 2024-05-14 MED ORDER — SPIRONOLACTONE 25 MG PO TABS: 25.0000 mg | ORAL_TABLET | Freq: Every day | ORAL | 2 refills | Status: AC

## 2024-05-14 MED ORDER — ATORVASTATIN CALCIUM 10 MG PO TABS: 10.0000 mg | ORAL_TABLET | Freq: Every day | ORAL | 3 refills | Status: AC

## 2024-05-14 MED ORDER — FLUDEOXYGLUCOSE F - 18 (FDG) INJECTION
7.5000 | Freq: Once | INTRAVENOUS | Status: AC
  Administered 2024-05-14: 7.79 via INTRAVENOUS

## 2024-05-14 MED ORDER — TORSEMIDE 20 MG PO TABS: 20.0000 mg | ORAL_TABLET | Freq: Every day | ORAL | 2 refills | Status: AC

## 2024-05-14 MED ORDER — EMPAGLIFLOZIN 10 MG PO TABS: 10.0000 mg | ORAL_TABLET | Freq: Every day | ORAL | 11 refills | Status: AC

## 2024-05-14 MED ORDER — APIXABAN 5 MG PO TABS: 5.0000 mg | ORAL_TABLET | Freq: Two times a day (BID) | ORAL | 6 refills | Status: AC

## 2024-05-14 NOTE — Progress Notes (Signed)
 [facility] CONSULT NOTE  Primary Care: Sabas Norleen PARAS., MD Primary Cardiologist: Vinie JAYSON Maxcy, MD HF Cardiologist: Dr. Cherrie  HPI: [name] is a 68 y.o. male with a hx of nonischemic cardiomyopathy with mildly reduced EF, nonobstructive CAD by heart cath, severe aortic stenosis with bicuspid valve status bioprosthetic AVR 12/2015, atrial fibrillation status post DCCV 03/2016, PAD status post left, femoral, superficial femoral, profunda femoral and external iliac endarterectomy and bovine pericardial patch angioplasty 10/2016, hypertension, tobacco abuse, COPD, OSA.  Admitted 11/25 with hypotension and elevated troponins. Echo showed EF 20-25% (down from 45-50% in 4/25. Severe prosthetic AS mean gradient . Concern for CGS, moved to ICU. Low dose DBA added without benefit, NE started. Underwent R/LHC showing low RA but elevated PWCP; LHC showed 95% stenosis large RPLV branch. CTS and Structural Heart consulted for evaluation for redo valve replacement. He was felt to be at high risk for conventional surgical aortic valve replacement. Pre-TAVR scans showed incidental finding of spiculated left apical pulmonary nodule concerning for bronchogenic carcinoma as well as prostatomegaly. Case was d/w oncology, who recommended brain MRI to r/o brain mets. This showed no evidence of metastatic disease. He underwent VIV TAVR 04/27/24. Post-op echo EF 30%, G2DD, mod MR (improved), mild/mod AI, AoV mean gradient 22 mmHg. Drips weaned and GDMT titrated, he was discharged home, weight 159 lbs.  Saw Pulmonology post op, scheduled bronch.  Today he returns for post hospital HF follow up. Overall feeling fine. No SOB with activity, walking around yard and to mailbox. Able to rake leaves in yard without dyspnea. Denies palpitations, abnormal bleeding, CP, dizziness, edema, or PND/Orthopnea. Appetite ok. Weight at home 155 pounds. Taking all medications. Quit smoking 04/19/24. Has PET scan  later today, EBUS scheduled for next month.  Cardiac Studies  - Echo (04/28/24) post op: EF 30%, G2DD, RV ok, moderate MR,  AVA 1.04 cm , AoV mean gradient 41.2 mmHg  - R/LHC (10/25) on DBA 2.5 + NE 10: 95% stenosis large RPLV branch; RA 4, PA 49/22 (32), PCWP 23, CO/CI (Fick) 4.42/2.64, PVR 2 WU, PAPi 6.75  - Echo 04/19/24: EF 20-25%, RV ok, moderate to severe MR, severe prosthetic AS with AVA 0.61 cm , AoV mean gradient 49 mmHg  Past Medical History:  Diagnosis Date   Asthma    CAD (coronary artery disease) 01/23/2016   minor CAD at cath-40% RCA   COPD (chronic obstructive pulmonary disease) (HCC)    denies patient said that a pulmonologist said he said that he didnt have COPD   HFrEF (heart failure with reduced ejection fraction) (HCC)    Hypertension    Non-ischemic cardiomyopathy (HCC) 01/23/2016   EF 30-35%   Pneumonia    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 Wendel and Dr. Daniel   Severe aortic stenosis    Current Outpatient Medications  Medication Sig Dispense Refill   acetaminophen  (TYLENOL ) 500 MG tablet Take 1,000 mg by mouth daily as needed for headache.     albuterol  (VENTOLIN  HFA) 108 (90 Base) MCG/ACT inhaler Inhale 2 puffs into the lungs every 4 (four) hours as needed for wheezing or shortness of breath.     apixaban (ELIQUIS) 5 MG TABS tablet Take 1 tablet (5 mg total) by mouth 2 (two) times daily. 60 tablet 6   atorvastatin  (LIPITOR) 10 MG tablet TAKE 1 TABLET  BY MOUTH EVERY DAY 90 tablet 3   empagliflozin  (JARDIANCE ) 10 MG TABS tablet Take 1 tablet (10 mg total) by mouth daily before breakfast. 30 tablet 11   finasteride  (PROSCAR ) 5 MG tablet Take 1 tablet (5 mg total) by mouth daily. 30 tablet 1   spironolactone (ALDACTONE) 25 MG tablet Take 1 tablet (25 mg total) by mouth daily. 30 tablet 6   tamsulosin  (FLOMAX ) 0.4 MG CAPS capsule Take 0.4 mg  by mouth every evening.  1   torsemide (DEMADEX) 20 MG tablet Take 1 tablet (20 mg total) by mouth daily. 30 tablet 6   No current facility-administered medications for this encounter.   Allergies  Allergen Reactions   No Known Allergies    Social History   Socioeconomic History   Marital status: Married    Spouse name: Not on file   Number of children: 4   Years of education: 10   Highest education level: Not on file  Occupational History   Occupation: holiday representative    Comment: Engineer, Mining  Tobacco Use   Smoking status: Former    Current packs/day: 0.00    Average packs/day: 1 pack/day for 48.0 years (48.0 ttl pk-yrs)    Types: Cigarettes    Start date: 01/23/1971    Quit date: 2025    Years since quitting: 0.8   Smokeless tobacco: Former   Tobacco comments:    Quit 04/19/24   Vaping Use   Vaping status: Former   Quit date: 08/28/2015  Substance and Sexual Activity   Alcohol use: Not Currently    Alcohol/week: 4.0 standard drinks of alcohol    Types: 4 Standard drinks or equivalent per week    Comment: beer   Drug use: No   Sexual activity: Not Currently  Other Topics Concern   Not on file  Social History Narrative   Not on file   Social Drivers of Health   Financial Resource Strain: Not on file  Food Insecurity: No Food Insecurity (05/06/2024)   Hunger Vital Sign    Worried About Running Out of Food in the Last Year: Never true    Ran Out of Food in the Last Year: Never true  Transportation Needs: No Transportation Needs (05/06/2024)   PRAPARE - Administrator, Civil Service (Medical): No    Lack of Transportation (Non-Medical): No  Physical Activity: Not on file  Stress: Not on file  Social Connections: Socially Isolated (04/19/2024)   Social Connection and Isolation Panel    Frequency of Communication with Friends and Family: More than three times a week    Frequency of Social Gatherings with Friends and Family: Twice a week    Attends  Religious Services: Never    Database Administrator or Organizations: No    Attends Banker Meetings: Never    Marital Status: Widowed  Intimate Partner Violence: Not At Risk (05/06/2024)   Humiliation, Afraid, Rape, and Kick questionnaire    Fear of Current or Ex-Partner: No    Emotionally Abused: No    Physically Abused: No    Sexually Abused: No   Family History  Problem Relation Age of Onset   Breast cancer Mother    Breast cancer Sister    Wt Readings from Last 3 Encounters:  05/14/24 71.2 kg (157 lb)  05/13/24 72.4 kg (159 lb 9.6 oz)  05/06/24 71.4 kg (157 lb 6.4 oz)   BP 116/60   Pulse 66   Wt 71.2 kg (  157 lb)   SpO2 97%   BMI 22.53 kg/m   PHYSICAL EXAM: General:  NAD. No resp difficulty, walked into clinic HEENT: Normal Neck: Supple. No JVD. Cor: Regular rate & rhythm. No rubs, gallops, 2/6 SEM RSUB Lungs: Clear, diminished Abdomen: Soft, nontender, nondistended.  Extremities: No cyanosis, clubbing, rash, edema Neuro: Alert & oriented x 3, moves all 4 extremities w/o difficulty. Affect pleasant.  ECG (personally reviewed): SR with PVC  ASSESSMENT & PLAN: 1. Chronic systolic CHF  - Echo (10/25): EF 20-25%, moderate LV dilation,  RV normal, moderate-severe MR, bioprosthetic aortic valve with severe stenosis and mean gradient 49 with mild-moderate AI, IVC not dilated.  -  Prior echo had shown EF 45-50%.  In the past, EF dropped with severe AS.  Suspect this is the cause again.   - LHC in 2017 showed nonobstructive CAD,  - R/LHC this admission: 95% stenosis large RPLV branch.  On RHC, RA pressure was low but PCWP elevated.  - Intra-op echo with EF 25%, mild/mid MR - Echo 04/28/24: EF 30% w mod MR - NYHA II, volume OK - Continue torsemide 20 mg daily. - Continue spiro 12.5 mg daily - Continue Jardiance  10 mg daily - BP too low for ARB/ARNi - no ? blocker w/ low output  - Labs today. - Repeat echo in 3-4 months - Discuss CR next visit   2.  Aortic stenosis:  - Severe bioprosthetic AS with mild-moderate AI. He initially had AVR for bicuspid aortic valve. S - s/p VIV TAVR 04/27/24 - intra-op mean gradient 8 mmHg; formal post-op 22 mmHg with mod AI - Continue Eliquis 5 mg bid. No bleeding issues - CBC today - He has follow up with Structural Hear Team   3. Spiculated Left Apical Pulmonary Nodule/ Prostatomegaly - incidental findings on TAVR protocol CTA - PSA reassuring, normal at 1.9  mg/ml, can follow w/ outpatient urology  - Has PET and EBUS arranged, following with Pulm and Onc   4. CAD:  - LHC (10/25): 95% stenosis in large RPLV branch.  For the time being, will manage medically.  - No chest pain - Continue Eliquis, no ASA - Continue atorvastatin  10 mg daily   5. Atrial fibrillation:  - Paroxysmal.  - NSR on ECG today - Continue Eliquis 5 mg bid   6 PAD:  - History of peripheral intervention in 2018.     7. Smoking/suspected COPD:  - Quit 03/2024. - Congratulated.  Follow up in 3-4 weeks with APP.  Harlene Gainer, FNP-BC 05/14/24

## 2024-05-14 NOTE — Telephone Encounter (Signed)
 Called to confirm/remind patient of their appointment at the [facility] on 11/15/.   Appointment:   [] Confirmed  [] Left mess   [] No answer/No voice mail  [] VM Full/unable to leave message  [] Phone not in service  Patient reminded to bring all medications and/or complete list.  Confirmed patient has transportation. Gave directions, instructed to utilize valet parking.

## 2024-05-14 NOTE — Telephone Encounter (Signed)
 Patient has been informed letter sent. Routing To [name].

## 2024-05-14 NOTE — Patient Instructions (Addendum)
 Good to see you today!   Labs done today, your results will be available in MyChart, we will contact you for abnormal readings.  Your physician recommends that you schedule a follow-up appointment as scheduled  Your cardiac medications have been refilled  If you have any questions or concerns before your next appointment please send us  a message through [place] or call our office at [phone].    TO LEAVE A MESSAGE FOR THE NURSE SELECT OPTION 2, PLEASE LEAVE A MESSAGE INCLUDING: YOUR NAME DATE OF BIRTH CALL BACK NUMBER REASON FOR CALL**this is important as we prioritize the call backs  YOU WILL RECEIVE A CALL BACK THE SAME DAY AS LONG AS YOU CALL BEFORE 4:00 PM At the [facility], you and your health needs are our priority. As part of our continuing mission to provide you with exceptional heart care, we have created designated Provider Care Teams. These Care Teams include your primary Cardiologist (physician) and Advanced Practice Providers (APPs- Physician Assistants and Nurse Practitioners) who all work together to provide you with the care you need, when you need it.   You may see any of the following providers on your designated Care Team at your next follow up: Dr Toribio Fuel Dr Ezra Shuck Dr. Morene Brownie Greig Mosses, NP Caffie Shed, [place] [facility] [place], [place] Beckey Coe, NP Jordan Lee, NP Ellouise Class, NP Tinnie Redman, PharmD Jaun Bash, PharmD   Please be sure to bring in all your medications bottles to every appointment.    Thank you for choosing [facility]

## 2024-05-17 ENCOUNTER — Encounter: Payer: Self-pay | Admitting: Physician Assistant

## 2024-05-17 ENCOUNTER — Telehealth: Payer: Self-pay | Admitting: Physician Assistant

## 2024-05-17 ENCOUNTER — Ambulatory Visit (HOSPITAL_COMMUNITY): Payer: Self-pay | Admitting: Family Medicine

## 2024-05-17 DIAGNOSIS — K118 Other diseases of salivary glands: Secondary | ICD-10-CM

## 2024-05-17 NOTE — Telephone Encounter (Signed)
 I called Mr. [name] to review the PET scan results from 05/14/2024. Findings show LUL spiculated pulmonary nodule concerning for primary lung cancer. No evidence of metastatic disease. Patient is scheduled for bronch with biopsy on 06/03/2024. I will follow up once diagnosis is confirmed to determine next steps.   PET scan incidentally showed probably small incidental hypermetabolic nodule int he deep lobe of the right parotid gland and asymmetric activity in the right nasopharynx. Patient denies any symptoms at this time. Will make referral to ENT for further evaluation.   [name] expressed understanding of the plan provided.

## 2024-05-19 ENCOUNTER — Ambulatory Visit (HOSPITAL_COMMUNITY)
Admission: RE | Admit: 2024-05-19 | Discharge: 2024-05-19 | Disposition: A | Discharge status: Home / Self Care | Source: Ambulatory Visit | Attending: Cardiology | Admitting: Cardiology

## 2024-05-19 DIAGNOSIS — I5022 Chronic systolic (congestive) heart failure: Secondary | ICD-10-CM | POA: Diagnosis present

## 2024-05-19 DIAGNOSIS — Z952 Presence of prosthetic heart valve: Secondary | ICD-10-CM | POA: Diagnosis present

## 2024-05-19 LAB — ECHOCARDIOGRAM COMPLETE
AR max vel: 2.25 cm2
AV Area VTI: 2.27 cm2
AV Area mean vel: 2.23 cm2
AV Mean grad: 20 mmHg
AV Peak grad: 37.5 mmHg
Ao pk vel: 3.06 m/s
Area-P 1/2: 3.12 cm2
MV M vel: 4.95 m/s
MV Peak grad: 98 mmHg
Radius: 0.5 cm
S' Lateral: 5.1 cm

## 2024-05-24 ENCOUNTER — Ambulatory Visit: Attending: Physician Assistant | Admitting: Physician Assistant

## 2024-05-24 VITALS — BP 118/56 | HR 60 | Ht 69.0 in | Wt 166.0 lb

## 2024-05-24 DIAGNOSIS — I739 Peripheral vascular disease, unspecified: Secondary | ICD-10-CM | POA: Insufficient documentation

## 2024-05-24 DIAGNOSIS — I480 Paroxysmal atrial fibrillation: Secondary | ICD-10-CM | POA: Insufficient documentation

## 2024-05-24 DIAGNOSIS — I2510 Atherosclerotic heart disease of native coronary artery without angina pectoris: Secondary | ICD-10-CM | POA: Insufficient documentation

## 2024-05-24 DIAGNOSIS — I5020 Unspecified systolic (congestive) heart failure: Secondary | ICD-10-CM | POA: Diagnosis not present

## 2024-05-24 DIAGNOSIS — R918 Other nonspecific abnormal finding of lung field: Secondary | ICD-10-CM | POA: Insufficient documentation

## 2024-05-24 DIAGNOSIS — Z952 Presence of prosthetic heart valve: Secondary | ICD-10-CM | POA: Diagnosis present

## 2024-05-24 DIAGNOSIS — N400 Enlarged prostate without lower urinary tract symptoms: Secondary | ICD-10-CM | POA: Insufficient documentation

## 2024-05-24 NOTE — Progress Notes (Signed)
 [facility]   MULTIDISCIPLINARY HEART VALVE CLINIC                                     Cardiology Office Note:    Date:  05/24/2024   ID:  CHAZ MCGLASSON, DOB [birth year], MRN [id number]  PCP:  Sabas Norleen PARAS., MD  [facility] Cardiologist:  Vinie JAYSON Maxcy, MD  [facility] Structural heart: Lurena MARLA Red, MD [facility] Electrophysiologist:  None   Referring MD: Sabas Norleen PARAS., MD   1 month s/p TAVR  History of Present Illness:    [name] is a 68 y.o. 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   Admitted 05/2024 with cardiogenic shock and severe bioprosthetic valve failure. Echo showed EF 20-25% (down from 45-50% in 4/25. Severe prosthetic AS mean gradient . Underwent R/LHC showing low RA but elevated PWCP; LHC showed 95% stenosis large RPLV branch. CTS and Structural Heart consulted for evaluation for redo valve replacement. He was felt to be at high risk for conventional surgical aortic valve replacement. Pre-TAVR scans showed incidental finding of spiculated left apical pulmonary nodule concerning for bronchogenic carcinoma as well as prostatomegaly. Case was d/w oncology, who recommended brain MRI to r/o brain mets. This showed no evidence of metastatic disease. He underwent VIV TAVR 04/27/24. Post-op echo EF 30%, G2DD, mod MR (improved), mild/mod AI, AoV mean gradient 22 mmHg. Drips weaned and GDMT titrated, he was discharged home, weight 159 lbs. Saw Pulmonology post op, scheduled bronch on 06/03/24. Echo 05/19/24 showed EF 35-40%, moderate LV dilation, G3DD,  normally functioning TAVR with a mean gradient of 20 mm hg and no PVL.   Today the patient presents to clinic for follow up. Here alone. He is doing so much better. No CP or SOB. No LE edema, orthopnea or PND. No dizziness or syncope. No blood in stool or urine. No palpitations. He has gained 6 lbs due to eating more. Quit smoking on 04/19/24. Had SPB 88 recently when check by [facility] but not symptomatic. Staying very active working out in the yard and blowing leaves. He is mostly limited by exertional leg pain.    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 Red and Dr. Daniel   Severe aortic stenosis      Current Medications: Current Meds  Medication Sig   acetaminophen  (TYLENOL ) 500 MG tablet Take 1,000 mg by mouth daily as needed for headache.   albuterol  (VENTOLIN  HFA) 108 (90  Base) MCG/ACT inhaler Inhale 2 puffs into the lungs every 4 (four) hours as needed for wheezing or shortness of breath.   apixaban  (ELIQUIS ) 5 MG TABS tablet Take 1 tablet (5 mg total) by mouth 2 (two) times daily.   atorvastatin  (LIPITOR) 10 MG tablet Take 1 tablet (10 mg total) by mouth daily.   empagliflozin  (JARDIANCE ) 10 MG TABS tablet Take 1 tablet (10 mg total) by mouth daily before breakfast.   finasteride  (PROSCAR ) 5 MG tablet Take 1 tablet (5 mg total) by mouth daily.   spironolactone  (ALDACTONE ) 25 MG tablet Take 1 tablet (25 mg total) by mouth daily.   tamsulosin  (FLOMAX ) 0.4 MG CAPS  capsule Take 0.4 mg by mouth every evening.   torsemide  (DEMADEX ) 20 MG tablet Take 1 tablet (20 mg total) by mouth daily.      ROS:   Please see the history of present illness.    All other systems reviewed and are negative.  EKGs       Risk Assessment/Calculations:    CHA2DS2-VASc Score = 4   This indicates a 4.8% annual risk of stroke. The patient's score is based upon: CHF History: 1 HTN History: 1 Diabetes History: 0 Stroke History: 0 Vascular Disease History: 1 Age Score: 1 Gender Score: 0          Physical Exam:    VS:  BP (!) 118/56   Pulse 60   Ht 5' 9 (1.753 m)   Wt 166 lb (75.3 kg)   SpO2 96%   BMI 24.51 kg/m     Wt Readings from Last 3 Encounters:  05/24/24 166 lb (75.3 kg)  05/14/24 157 lb (71.2 kg)  05/13/24 159 lb 9.6 oz (72.4 kg)     GEN: Well nourished, well developed in no acute distress NECK: No JVD CARDIAC: RRR, no murmurs, rubs, gallops RESPIRATORY:  Clear to auscultation without rales, wheezing or rhonchi  ABDOMEN: Soft, non-tender, non-distended EXTREMITIES:  No edema; No deformity.   ASSESSMENT:    1. S/P VIV TAVR (transcatheter aortic valve replacement)   2. HFrEF (heart failure with reduced ejection fraction) (HCC)   3. Lung mass   4. Enlarged prostate   5. CAD in native artery   6. PAF (paroxysmal atrial fibrillation) (HCC)   7. PAD (peripheral artery disease)     PLAN:    In order of problems listed above:  Bioprosthetic aortic valve failure s/p VIV TAVR:  -- Echo 05/19/24 showed EF 35-40%, moderate LV dilation, G3DD, normally functioning TAVR with a mean gradient of 20 mm hg and no PVL.  -- NYHA class I symptoms with marked improvement s/p TAVR.  -- Continue Eliquis  5mg  BID.  -- SBE prophylaxis discussed; the patient is edentulous and does not go to the dentist.  -- I will see back for 1 year office visit with echo.  HFrEF: -- EF 35-40%. -- Continue torsemide  20 mg daily, spiro 12.5 mg daily and Jardiance  10  mg daily. -- BP too low for ARB/ARNi (although improved today. SBP 88 last week when checked by [facility]) -- no ? blocker w/ low output. -- Followed by the advanced CHF team.    Spiculated Left Apical Pulmonary Nodule: -- Bronch on 06/03/24.   Prostatomegaly: -- F/u PSA reassuring, normal at 1.9  mg/ml.  -- Referral sent to [facility].    CAD:  -- LHC showed 95% stenosis in large RPLV branch.   -- No chest pain. Plan medical management.  -- No aspirin  given  need for OAC at discharge. -- Continue atorvastatin  10mg  daily (LDL 42 04/20/24).   PAF:   -- Sounds regular on exam.  -- Continue Eliquis  5mg  BID.    PAD:  -- Previous SFA intervention 2018.   -- Continue atorvastatin  10mg  daily.  -- Has claudication but remains as active as possible.    COPD with ongoing tobacco abuse:  -- Quit smoking on 04/19/24. -- Congratulated him.     Medication Adjustments/Labs and Tests Ordered: Current medicines are reviewed at length with the patient today.  Concerns regarding medicines are outlined above.  Orders Placed This Encounter  Procedures   ECHOCARDIOGRAM COMPLETE   No orders of the defined types were placed in this encounter.   Patient Instructions  Medication Instructions:  Your physician recommends that you continue on your current medications as directed. Please refer to the Current Medication list given to you today.  *If you need a refill on your cardiac medications before your next appointment, please call your pharmacy*  Lab Work: None needed If you have labs (blood work) drawn today and your tests are completely normal, you will receive your results only by: MyChart Message (if you have MyChart) OR A paper copy in the mail If you have any lab test that is abnormal or we need to change your treatment, we will call you to review the results.  Testing/Procedures: 04/11/2025 Your physician has requested that you have an echocardiogram. Echocardiography is a painless  test that uses sound waves to create images of your heart. It provides your doctor with information about the size and shape of your heart and how well your heart's chambers and valves are working. This procedure takes approximately one hour. There are no restrictions for this procedure. Please do NOT wear cologne, perfume, aftershave, or lotions (deodorant is allowed). Please arrive 15 minutes prior to your appointment time.  Please note: We ask at that you not bring children with you during ultrasound (echo/ vascular) testing. Due to room size and safety concerns, children are not allowed in the ultrasound rooms during exams. Our front office staff cannot provide observation of children in our lobby area while testing is being conducted. An adult accompanying a patient to their appointment will only be allowed in the ultrasound room at the discretion of the ultrasound technician under special circumstances. We apologize for any inconvenience.   Follow-Up: At [facility], you and your health needs are our priority.  As part of our continuing mission to provide you with exceptional heart care, our providers are all part of one team.  This team includes your primary Cardiologist (physician) and Advanced Practice Providers or APPs (Physician Assistants and Nurse Practitioners) who all work together to provide you with the care you need, when you need it.  Your next appointment:   As scheduled on 06/11/24  Provider:   Heart Failure Clinic  We recommend signing up for the patient portal called MyChart.  Sign up information is provided on this After Visit Summary.  MyChart is used to connect with patients for Virtual Visits (Telemedicine).  Patients are able to view lab/test results, encounter notes, upcoming appointments, etc.  Non-urgent messages can be sent to your provider as well.   To learn more about what you can do with MyChart, go to [email or web address].            Signed, [name], PA-C  05/24/2024 11:36 AM    [facility]

## 2024-05-24 NOTE — Patient Instructions (Addendum)
 Medication Instructions:  Your physician recommends that you continue on your current medications as directed. Please refer to the Current Medication list given to you today.  *If you need a refill on your cardiac medications before your next appointment, please call your pharmacy*  Lab Work: None needed If you have labs (blood work) drawn today and your tests are completely normal, you will receive your results only by: MyChart Message (if you have MyChart) OR A paper copy in the mail If you have any lab test that is abnormal or we need to change your treatment, we will call you to review the results.  Testing/Procedures: 04/11/2025 Your physician has requested that you have an echocardiogram. Echocardiography is a painless test that uses sound waves to create images of your heart. It provides your doctor with information about the size and shape of your heart and how well your heart's chambers and valves are working. This procedure takes approximately one hour. There are no restrictions for this procedure. Please do NOT wear cologne, perfume, aftershave, or lotions (deodorant is allowed). Please arrive 15 minutes prior to your appointment time.  Please note: We ask at that you not bring children with you during ultrasound (echo/ vascular) testing. Due to room size and safety concerns, children are not allowed in the ultrasound rooms during exams. Our front office staff cannot provide observation of children in our lobby area while testing is being conducted. An adult accompanying a patient to their appointment will only be allowed in the ultrasound room at the discretion of the ultrasound technician under special circumstances. We apologize for any inconvenience.   Follow-Up: At [facility], you and your health needs are our priority.  As part of our continuing mission to provide you with exceptional heart care, our providers are all part of one team.  This team includes your primary  Cardiologist (physician) and Advanced Practice Providers or APPs (Physician Assistants and Nurse Practitioners) who all work together to provide you with the care you need, when you need it.  Your next appointment:   As scheduled on 06/11/24  Provider:   Heart Failure Clinic  We recommend signing up for the patient portal called MyChart.  Sign up information is provided on this After Visit Summary.  MyChart is used to connect with patients for Virtual Visits (Telemedicine).  Patients are able to view lab/test results, encounter notes, upcoming appointments, etc.  Non-urgent messages can be sent to your provider as well.   To learn more about what you can do with MyChart, go to [email or web address].

## 2024-05-31 ENCOUNTER — Other Ambulatory Visit: Payer: Self-pay

## 2024-05-31 ENCOUNTER — Encounter (HOSPITAL_COMMUNITY): Payer: Self-pay

## 2024-05-31 NOTE — Progress Notes (Signed)
 SDW CALL  Patient was given pre-op instructions over the phone. The opportunity was given for the patient to ask questions. No further questions asked. Patient verbalized understanding of instructions given.   PCP - Norleen Neer Cardiologist - Dr. Vinie Maxcy Patient had TAVR on 04/27/24 and had follow up on 05/24/24  PPM/ICD - denies Device Orders - n/a Rep Notified -  n/a  Chest x-ray - 04/26/24 EKG - 05/14/24 Stress Test - denies ECHO - 05/19/24 Cardiac Cath - 04/20/24  Sleep Study - pt states that at one time he was diagnosed with sleep apnea but has no issues now - pt does not wear a cpap  No DM  Last dose of GLP1 agonist-  n/a GLP1 instructions:  n/a  Patient instructed to continue Jardiance  because he takes it for heart failure.   Blood Thinner Instructions: last dose of Eliquis  is 12/1 - patient is aware this should be his last dose Aspirin  Instructions: n/a  ERAS Protcol - NPO PRE-SURGERY Ensure or G2- n/a  COVID TEST-  n/a   Anesthesia review: yes - CAD, COPD, CHF, aortic stenosis, TAVR on 04/27/24  Patient denies shortness of breath, fever, cough and chest pain over the phone call   All instructions explained to the patient, with a verbal understanding of the material. Patient agrees to go over the instructions while at home for a better understanding.

## 2024-06-01 NOTE — Progress Notes (Addendum)
 Anesthesia Chart Review: Same day workup  68 y.o. 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    Admitted 05/2024 with cardiogenic shock and severe bioprosthetic valve failure. Echo showed EF 20-25% (down from 45-50% in 4/25. Severe prosthetic AS mean gradient . Underwent R/LHC showing low RA but elevated PWCP; LHC showed 95% stenosis large RPLV branch. CTS and Structural Heart consulted for evaluation for redo valve replacement. He was felt to be at high risk for conventional surgical aortic valve replacement. Pre-TAVR scans showed incidental finding of spiculated left apical pulmonary nodule concerning for bronchogenic carcinoma as well as prostatomegaly. Case was d/w oncology, who recommended brain MRI to r/o brain mets. This showed no evidence of metastatic disease. He underwent VIV TAVR 04/27/24. Post-op echo EF 30%, G2DD, mod MR (improved), mild/mod AI, AoV mean gradient 22 mmHg. Drips weaned and GDMT titrated, he was discharged home, weight 159 lbs. Saw Pulmonology post op, scheduled bronch on 06/03/24. Echo 05/19/24 showed EF 35-40%, moderate LV dilation, G3DD, normally functioning TAVR with a mean gradient of 20 mm hg and no PVL.   Patient last seen in cardiology follow-up by Izetta Hummer, PA-C on 05/24/2024 and noted to be doing well from cardiac standpoint, NYHA class I symptoms with marked improvement s/p TAVR.  He is continued on Eliquis  5 mg twice daily, torsemide  20 mg daily, spironolactone  12.5 mg daily, Jardiance  10 mg daily, atorvastatin  10 mg daily.  It was discussed that he would be having  bronchoscopy on 06/03/2024 for spiculated left apical pulmonary nodule.  He was recommended to follow-up in 1 year.  BMP and CBC 05/14/2024 reviewed, mild thrombocytopenia platelets 135, BNP mildly elevated at 205, otherwise unremarkable.  Pt reports last dose of Eliquis  12/1.  Patient will need day of surgery evaluation.  EKG 05/14/2024: Sinus bradycardia.  Rate 57.  Rightward axis.  Minimal voltage criteria for LVH, may be normal variant.  Septal infarct (cited on or before 05-Nov-2023)  PET imaging 05/14/2024: IMPRESSION: 1. The spiculated left upper lobe pulmonary nodule is hypermetabolic, consistent with primary bronchogenic carcinoma. 2. No evidence of metastatic disease. 3. Previously demonstrated focal airspace disease or ill-defined ground-glass nodule in the right upper lobe appears less conspicuous and demonstrates no hypermetabolic activity, favoring an inflammatory or other benign etiology. Recommend attention on follow-up. 4. Probable small incidental hypermetabolic nodule in the deep lobe of the right parotid gland. 5. Asymmetric activity in the right nasopharynx without corresponding abnormality on the CT images, possibly muscular. Consider correlation with direct visualization. 6. Aortic Atherosclerosis (ICD10-I70.0) and Emphysema (ICD10-J43.9).   TTE 05/19/2024: 1. Left ventricular ejection fraction, by estimation, is 35 to 40%. Left  ventricular ejection fraction by 3D volume is 35 %. The left ventricle has  moderately decreased function. The left ventricle demonstrates global  hypokinesis. The left ventricular  internal cavity size was moderately dilated. Left ventricular diastolic  parameters are consistent with Grade III diastolic dysfunction  (restrictive). Elevated left atrial pressure.   2. Right ventricular systolic function is normal. The right ventricular  size is mildly enlarged.   3. Left atrial size was severely dilated.   4. Right atrial size was  mildly dilated.  5. The mitral valve is normal in structure. Mild mitral valve  regurgitation. No evidence of mitral stenosis.   6. The aortic valve has been repaired/replaced. Aortic valve  regurgitation is not visualized. No aortic stenosis is present. There is a  26 mm Edwards Sapien prosthetic (TAVR) valve present in the aortic  position. Procedure Date: 04/27/2024. Echo  findings are consistent with normal structure and function of the aortic  valve prosthesis. Aortic valve area, by VTI measures 2.27 cm. Aortic  valve mean gradient measures 20.0 mmHg. Aortic valve Vmax measures 3.06  m/s.   7. The inferior vena cava is normal in size with greater than 50%  respiratory variability, suggesting right atrial pressure of 3 mmHg.   R/L cath 04/20/2024:    RPAV lesion is 95% stenosed.   1. 95% stenosis in a large PLV vessel (the RCA has a relatively early bifurcation).  2. Low RA pressure but elevated PCWP.  3. Mild pulmonary venous hypertension. 4. Cardiac output adequate on dobutamine  + norepinephrine .       Lynwood Geofm [facility] Short Stay Center/Anesthesiology Phone [phone] 06/01/2024 2:39 PM

## 2024-06-01 NOTE — Anesthesia Preprocedure Evaluation (Addendum)
 Anesthesia Evaluation  Patient identified by MRN, date of birth, ID band Patient awake    Reviewed: Allergy & Precautions, NPO status , Patient's Chart, lab work & pertinent test results  Airway Mallampati: I  TM Distance: >3 FB Neck ROM: Full    Dental  (+) Edentulous Upper, Edentulous Lower   Pulmonary asthma , COPD, former smoker    + decreased breath sounds      Cardiovascular hypertension, + CAD, + Peripheral Vascular Disease and +CHF  + Valvular Problems/Murmurs AS  Rhythm:Regular Rate:Normal  - s/p TAVR  Echo:   1. Left ventricular ejection fraction, by estimation, is 35 to 40%. Left  ventricular ejection fraction by 3D volume is 35 %. The left ventricle has  moderately decreased function. The left ventricle demonstrates global  hypokinesis. The left ventricular  internal cavity size was moderately dilated. Left ventricular diastolic  parameters are consistent with Grade III diastolic dysfunction  (restrictive). Elevated left atrial pressure.   2. Right ventricular systolic function is normal. The right ventricular  size is mildly enlarged.   3. Left atrial size was severely dilated.   4. Right atrial size was mildly dilated.   5. The mitral valve is normal in structure. Mild mitral valve  regurgitation. No evidence of mitral stenosis.   6. The aortic valve has been repaired/replaced. Aortic valve  regurgitation is not visualized. No aortic stenosis is present. There is a  26 mm Edwards Sapien prosthetic (TAVR) valve present in the aortic  position. Procedure Date: 04/27/2024. Echo  findings are consistent with normal structure and function of the aortic  valve prosthesis. Aortic valve area, by VTI measures 2.27 cm. Aortic  valve mean gradient measures 20.0 mmHg. Aortic valve Vmax measures 3.06  m/s.   7. The inferior vena cava is normal in size with greater than 50%  respiratory variability, suggesting right atrial  pressure of 3 mmHg.     Neuro/Psych negative neurological ROS  negative psych ROS   GI/Hepatic negative GI ROS, Neg liver ROS,,,  Endo/Other  negative endocrine ROS    Renal/GU negative Renal ROS     Musculoskeletal  (+) Arthritis ,    Abdominal   Peds  Hematology negative hematology ROS (+)   Anesthesia Other Findings   Reproductive/Obstetrics                              Anesthesia Physical Anesthesia Plan  ASA: 3  Anesthesia Plan: General   Post-op Pain Management: Tylenol  PO (pre-op)*   Induction: Intravenous  PONV Risk Score and Plan: 3 and Ondansetron , Dexamethasone and Midazolam   Airway Management Planned: Oral ETT  Additional Equipment: None  Intra-op Plan:   Post-operative Plan: Extubation in OR  Informed Consent: I have reviewed the patients History and Physical, chart, labs and discussed the procedure including the risks, benefits and alternatives for the proposed anesthesia with the patient or authorized representative who has indicated his/her understanding and acceptance.     Dental advisory given  Plan Discussed with: CRNA  Anesthesia Plan Comments: (PAT note by Lynwood Hope, PA-C: 68 y.o. 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   Admitted 05/2024 with cardiogenic shock and severe bioprosthetic valve failure. Echo showed EF 20-25% (down from 45-50% in 4/25. Severe prosthetic AS mean gradient . Underwent R/LHC showing low RA but elevated PWCP; LHC showed 95% stenosis large RPLV branch. CTS and Structural Heart consulted  for evaluation for redo valve replacement. He was felt to be at high risk for conventional surgical aortic valve replacement. Pre-TAVR scans showed incidental finding of spiculated left apical pulmonary nodule concerning for bronchogenic carcinoma as well as prostatomegaly. Case was d/w oncology, who recommended brain MRI to r/o brain mets. This showed no evidence of metastatic disease. He underwent VIV TAVR 04/27/24. Post-op echo EF 30%, G2DD, mod MR (improved), mild/mod AI, AoV mean gradient 22 mmHg. Drips weaned and GDMT titrated, he was discharged home, weight 159 lbs. Saw Pulmonology post op, scheduled bronch on 06/03/24. Echo 05/19/24 showed EF 35-40%, moderate LV dilation, G3DD, normally functioning TAVR with a mean gradient of 20 mm hg and no PVL.   Patient last seen in cardiology follow-up by Izetta Hummer, PA-C on 05/24/2024 and noted to be doing well from cardiac standpoint, NYHA class I symptoms with marked improvement s/p TAVR.  He is continued on Eliquis  5 mg twice daily, torsemide  20 mg daily, spironolactone  12.5 mg daily, Jardiance  10 mg daily, atorvastatin  10 mg daily.  It was discussed that he would be having bronchoscopy on 06/03/2024 for spiculated left apical pulmonary nodule.  He was recommended to follow-up in 1 year.  BMP and CBC 05/14/2024 reviewed, mild thrombocytopenia platelets 135, BNP mildly elevated at 205, otherwise unremarkable.   Pt reports last dose of Eliquis  12/1.  Patient will need day of surgery evaluation.  EKG 05/14/2024: Sinus bradycardia.  Rate 57.  Rightward axis.  Minimal voltage criteria for LVH, may be normal variant.  Septal infarct (cited on or before 05-Nov-2023)  PET imaging 05/14/2024: IMPRESSION: 1. The spiculated left upper lobe pulmonary nodule is hypermetabolic, consistent with primary bronchogenic carcinoma. 2. No evidence of metastatic disease. 3. Previously demonstrated focal airspace disease or ill-defined ground-glass nodule in the right  upper lobe appears less conspicuous and demonstrates no hypermetabolic activity, favoring an inflammatory or other benign etiology. Recommend attention on follow-up. 4. Probable small incidental hypermetabolic nodule in the deep lobe of the right parotid gland. 5. Asymmetric activity in the right nasopharynx without corresponding abnormality on the CT images, possibly muscular. Consider correlation with direct visualization. 6. Aortic Atherosclerosis (ICD10-I70.0) and Emphysema (ICD10-J43.9).  TTE 05/19/2024: 1. Left ventricular ejection fraction, by estimation, is 35 to 40%. Left  ventricular ejection fraction by 3D volume is 35 %. The left ventricle has  moderately decreased function. The left ventricle demonstrates global  hypokinesis. The left ventricular  internal cavity size was moderately dilated. Left ventricular diastolic  parameters are consistent with Grade III diastolic dysfunction  (restrictive). Elevated left atrial pressure.  2. Right ventricular systolic function is normal. The right ventricular  size is mildly enlarged.  3. Left atrial size was severely dilated.  4. Right atrial size was mildly dilated.  5. The mitral valve is normal in structure. Mild mitral valve  regurgitation. No evidence of mitral stenosis.  6. The aortic valve has been repaired/replaced. Aortic valve  regurgitation is not visualized. No aortic stenosis is present. There is a  26 mm Edwards Sapien prosthetic (TAVR) valve present in the aortic  position. Procedure Date: 04/27/2024. Echo  findings are consistent with normal structure and function of the aortic  valve prosthesis. Aortic valve  area, by VTI measures 2.27 cm. Aortic  valve mean gradient measures 20.0 mmHg. Aortic valve Vmax measures 3.06  m/s.  7. The inferior vena cava is normal in size with greater than 50%  respiratory variability, suggesting right atrial pressure of 3 mmHg.   R/L cath 04/20/2024:    RPAV lesion is  95% stenosed.  1. 95% stenosis in a large PLV vessel (the RCA has a relatively early bifurcation).  2. Low RA pressure but elevated PCWP.  3. Mild pulmonary venous hypertension. 4. Cardiac output adequate on dobutamine  + norepinephrine .   )         Anesthesia Quick Evaluation

## 2024-06-03 ENCOUNTER — Encounter (HOSPITAL_COMMUNITY): Payer: Self-pay

## 2024-06-03 ENCOUNTER — Encounter (HOSPITAL_COMMUNITY): Admission: RE | Disposition: A | Payer: Self-pay | Source: Home / Self Care

## 2024-06-03 ENCOUNTER — Ambulatory Visit (HOSPITAL_COMMUNITY)

## 2024-06-03 ENCOUNTER — Ambulatory Visit (HOSPITAL_COMMUNITY): Admission: RE | Admit: 2024-06-03 | Discharge: 2024-06-03 | Disposition: A

## 2024-06-03 ENCOUNTER — Ambulatory Visit (HOSPITAL_COMMUNITY): Payer: Self-pay | Admitting: Physician Assistant

## 2024-06-03 ENCOUNTER — Encounter (HOSPITAL_COMMUNITY): Payer: Self-pay | Admitting: Physician Assistant

## 2024-06-03 DIAGNOSIS — R911 Solitary pulmonary nodule: Secondary | ICD-10-CM | POA: Diagnosis present

## 2024-06-03 HISTORY — PX: VIDEO BRONCHOSCOPY WITH ENDOBRONCHIAL NAVIGATION: SHX6175

## 2024-06-03 HISTORY — DX: Unspecified osteoarthritis, unspecified site: M19.90

## 2024-06-03 SURGERY — VIDEO BRONCHOSCOPY WITH ENDOBRONCHIAL NAVIGATION
Anesthesia: General | Laterality: Left

## 2024-06-03 MED ORDER — OXYCODONE HCL 5 MG PO TABS: 5.0000 mg | ORAL_TABLET | Freq: Once | ORAL | Status: DC | PRN

## 2024-06-03 MED ORDER — PROPOFOL 10 MG/ML IV BOLUS
INTRAVENOUS | Status: DC | PRN
  Administered 2024-06-03: 150 ug/kg/min via INTRAVENOUS
  Administered 2024-06-03: 150 mg via INTRAVENOUS

## 2024-06-03 MED ORDER — CHLORHEXIDINE GLUCONATE 0.12 % MT SOLN
15.0000 mL | Freq: Once | OROMUCOSAL | Status: AC
  Administered 2024-06-03: 15 mL via OROMUCOSAL

## 2024-06-03 MED ORDER — SUGAMMADEX SODIUM 200 MG/2ML IV SOLN
INTRAVENOUS | Status: DC | PRN
  Administered 2024-06-03: 200 mg via INTRAVENOUS

## 2024-06-03 MED ORDER — ACETAMINOPHEN 10 MG/ML IV SOLN: 1000.0000 mg | Freq: Once | INTRAVENOUS | Status: DC | PRN

## 2024-06-03 MED ORDER — DROPERIDOL 2.5 MG/ML IJ SOLN: 0.6250 mg | Freq: Once | INTRAMUSCULAR | Status: DC | PRN

## 2024-06-03 MED ORDER — LIDOCAINE 2% (20 MG/ML) 5 ML SYRINGE
INTRAMUSCULAR | Status: DC | PRN
  Administered 2024-06-03: 50 mg via INTRAVENOUS

## 2024-06-03 MED ORDER — PHENYLEPHRINE 80 MCG/ML (10ML) SYRINGE FOR IV PUSH (FOR BLOOD PRESSURE SUPPORT)
PREFILLED_SYRINGE | INTRAVENOUS | Status: DC | PRN
  Administered 2024-06-03: 80 ug via INTRAVENOUS
  Administered 2024-06-03 (×2): 160 ug via INTRAVENOUS
  Administered 2024-06-03: 240 ug via INTRAVENOUS
  Administered 2024-06-03 (×2): 160 ug via INTRAVENOUS

## 2024-06-03 MED ORDER — ONDANSETRON HCL 4 MG/2ML IJ SOLN
INTRAMUSCULAR | Status: DC | PRN
  Administered 2024-06-03: 4 mg via INTRAVENOUS

## 2024-06-03 MED ORDER — ROCURONIUM BROMIDE 10 MG/ML (PF) SYRINGE
PREFILLED_SYRINGE | INTRAVENOUS | Status: DC | PRN
  Administered 2024-06-03: 50 mg via INTRAVENOUS
  Administered 2024-06-03: 20 mg via INTRAVENOUS
  Administered 2024-06-03: 10 mg via INTRAVENOUS

## 2024-06-03 MED ORDER — LACTATED RINGERS IV SOLN: INTRAVENOUS | Status: DC

## 2024-06-03 MED ORDER — DEXAMETHASONE SOD PHOSPHATE PF 10 MG/ML IJ SOLN
INTRAMUSCULAR | Status: DC | PRN
  Administered 2024-06-03: 10 mg via INTRAVENOUS

## 2024-06-03 MED ORDER — OXYCODONE HCL 5 MG/5ML PO SOLN: 5.0000 mg | Freq: Once | ORAL | Status: DC | PRN

## 2024-06-03 MED ORDER — CHLORHEXIDINE GLUCONATE 0.12 % MT SOLN
OROMUCOSAL | Status: DC
  Filled 2024-06-03: qty 15

## 2024-06-03 MED ORDER — FENTANYL CITRATE (PF) 100 MCG/2ML IJ SOLN: 25.0000 ug | INTRAMUSCULAR | Status: DC | PRN

## 2024-06-03 NOTE — Transfer of Care (Signed)
 Immediate Anesthesia Transfer of Care Note  Patient: [name]  Procedure(s) Performed: VIDEO BRONCHOSCOPY WITH ENDOBRONCHIAL NAVIGATION (Left)  Patient Location: PACU  Anesthesia Type:General  Level of Consciousness: awake, alert , and oriented  Airway & Oxygen Therapy: Patient Spontanous Breathing and Patient connected to nasal cannula oxygen  Post-op Assessment: Report given to RN and Post -op Vital signs reviewed and stable  Post vital signs: Reviewed and stable  Last Vitals:  Vitals Value Taken Time  BP 98/63 06/03/24 11:28  Temp    Pulse 63 06/03/24 11:29  Resp 20 06/03/24 11:29  SpO2 96 % 06/03/24 11:29  Vitals shown include unfiled device data.  Last Pain:  Vitals:   06/03/24 0655  TempSrc: Oral         Complications: There were no known notable events for this encounter.

## 2024-06-03 NOTE — Interval H&P Note (Signed)
 History and Physical Interval Note:  06/03/2024 9:02 AM  [name] Alert  has presented today for surgery, with the diagnosis of Left lung nodule.  The various methods of treatment have been discussed with the patient and family. After consideration of risks, benefits and other options for treatment, the patient has consented to  Procedure(s): VIDEO BRONCHOSCOPY WITH ENDOBRONCHIAL NAVIGATION (Left) as a surgical intervention.  The patient's history has been reviewed, patient examined, no change in status, stable for surgery.  I have reviewed the patient's chart and labs.  Questions were answered to the patient's satisfaction.     [name]

## 2024-06-03 NOTE — Op Note (Signed)
 Video Bronchoscopy with Endobronchial Ultrasound and Electromagnetic Navigation Procedure Note  Date of Operation: 06/03/2024  Pre-op Diagnosis: Left upper lobe Lung nodule  Surgeon: Zola Herter, MD   Anesthesia: General endotracheal anesthesia  Operation: Flexible video fiberoptic bronchoscopy with endobronchial ultrasound, robotic assisted navigation and biopsies.  Estimated Blood Loss: Minimal  Complications: No immediate complications   Indications and History: [name] is a 68 y.o. male with a left upper lobe nodule.  Recommendation was made to achieve a tissue diagnosis using endobronchial ultrasound and robotic assisted navigational bronchoscopy. The risks, benefits, complications, treatment options and expected outcomes were discussed with the patient.  The possibilities of pneumothorax, pneumonia, reaction to medication, pulmonary aspiration, perforation of a viscus, bleeding, failure to diagnose a condition and creating a complication requiring transfusion or operation were discussed with the patient who freely signed the consent.    Description of Procedure: The patient was seen in the Preoperative Area, was examined and was deemed appropriate to proceed.  The patient was taken to [facility] Endoscopy room 3, identified as [name] and the procedure verified as Flexible Video Fiberoptic Bronchoscopy with robotic assisted navigation and endobronchial ultrasound.  A Time Out was held and the above information confirmed.   Robotic assisted navigation: Prior to the date of the procedure a high-resolution CT scan of the chest was performed. Utilizing ION software program a virtual tracheobronchial tree was generated to allow the creation of distinct navigation pathways to the patient's parenchymal abnormalities. After being taken to the operating room general anesthesia was initiated and the patient  was orally intubated. The video fiberoptic bronchoscope was introduced via the  endotracheal tube and a general inspection was performed which showed normal right and left lung anatomy. Aspiration of the bilateral mainstems was completed to remove any remaining secretions. Robotic catheter inserted into patient's endotracheal tube.   Target #1 Left upper lobe nodule: The distinct navigation pathways prepared prior to this procedure were then utilized to navigate to patient's lesion identified on CT scan. The robotic catheter was secured into place and the vision probe was withdrawn.  Lesion location was approximated using fluoroscopy.  Local registration and targeting was performed using [employer] Cios mobile C-arm three-dimensional imaging. Radial EBUS used to confirm lesion and a concentric view was obtained. Under fluoroscopic guidance transbronchial needle biopsies, and transbronchial cryobiopsies were performed to be sent for cytology and pathology.  Needle-in-lesion was confirmed using Cios mobile C-arm.  A bronchioalveolar lavage was performed in the left upper lobe and sent for cytology .    At the end of the procedure a general airway inspection was performed and there was no evidence of active bleeding. The bronchoscope was removed.  The patient tolerated the procedure well. There was no significant blood loss and there were no obvious complications. A post-procedural chest x-ray is pending.  Samples Target #1: 2. Transbronchial Wang needle biopsies from left upper lobe nodule 3. Transbronchial cryobiopsies from left upper lobe nodule 4. Bronchoalveolar lavage from left upper lobe nodule     Zola Herter, MD 06/03/2024, 11:19 AM [facility]

## 2024-06-03 NOTE — Anesthesia Procedure Notes (Signed)
 Procedure Name: Intubation Date/Time: 06/03/2024 10:06 AM  Performed by: Alen Motto D, CRNAPre-anesthesia Checklist: Patient identified, Emergency Drugs available, Suction available and Patient being monitored Patient Re-evaluated:Patient Re-evaluated prior to induction Oxygen Delivery Method: Circle System Utilized Preoxygenation: Pre-oxygenation with 100% oxygen Induction Type: IV induction Ventilation: Mask ventilation without difficulty and Oral airway inserted - appropriate to patient size Laryngoscope Size: Cleotilde and 3 Grade View: Grade I Tube type: Oral Tube size: 8.0 mm Number of attempts: 1 Airway Equipment and Method: Stylet and Oral airway Placement Confirmation: ETT inserted through vocal cords under direct vision, positive ETCO2 and breath sounds checked- equal and bilateral Secured at: 23 cm Tube secured with: Tape Dental Injury: Teeth and Oropharynx as per pre-operative assessment

## 2024-06-03 NOTE — Op Note (Signed)
 Procedure Note  Patient: [name]  [employer] Cios/GE 3D OEC mobile C-arm was utilized to identify and biopsy left upper lobe nodule.  Needle-in-lesion was confirmed using real-time Cios/GE 3D OEC imaging, and images were uploaded to PACS.   [name], MD [facility] 06/03/2024 11:21 AM

## 2024-06-07 ENCOUNTER — Encounter (HOSPITAL_COMMUNITY): Payer: Self-pay

## 2024-06-07 LAB — CYTOLOGY - NON PAP

## 2024-06-07 NOTE — Anesthesia Postprocedure Evaluation (Signed)
 Anesthesia Post Note  Patient: [name]  Procedure(s) Performed: VIDEO BRONCHOSCOPY WITH ENDOBRONCHIAL NAVIGATION (Left)     Patient location during evaluation: PACU Anesthesia Type: General Level of consciousness: awake and alert Pain management: pain level controlled Vital Signs Assessment: post-procedure vital signs reviewed and stable Respiratory status: spontaneous breathing, nonlabored ventilation, respiratory function stable and patient connected to nasal cannula oxygen Cardiovascular status: blood pressure returned to baseline and stable Postop Assessment: no apparent nausea or vomiting Anesthetic complications: no   There were no known notable events for this encounter.            Alliah Boulanger D Ameriah Lint

## 2024-06-07 NOTE — Progress Notes (Signed)
 [facility] CLINIC CONSULT NOTE  Primary Care: Sabas Norleen PARAS., MD Primary Cardiologist: Vinie JAYSON Maxcy, MD HF Cardiologist: Dr. Cherrie  HPI: [name] is a 68 y.o. male with a hx of nonischemic cardiomyopathy with mildly reduced EF, nonobstructive CAD by heart cath, severe aortic stenosis with bicuspid valve status bioprosthetic AVR 12/2015, atrial fibrillation status post DCCV 03/2016, PAD status post left, femoral, superficial femoral, profunda femoral and external iliac endarterectomy and bovine pericardial patch angioplasty 10/2016, hypertension, tobacco abuse, COPD, OSA.  Admitted 11/25 with hypotension and elevated troponins. Echo showed EF 20-25% (down from 45-50% in 4/25. Severe prosthetic AS mean gradient . Concern for CGS, moved to ICU. Low dose DBA added without benefit, NE started. Underwent R/LHC showing low RA but elevated PWCP; LHC showed 95% stenosis large RPLV branch. CTS and Structural Heart consulted for evaluation for redo valve replacement. He was felt to be at high risk for conventional surgical aortic valve replacement. Pre-TAVR scans showed incidental finding of spiculated left apical pulmonary nodule concerning for bronchogenic carcinoma as well as prostatomegaly. Case was d/w oncology, who recommended brain MRI to r/o brain mets. This showed no evidence of metastatic disease. He underwent VIV TAVR 04/27/24. Post-op echo EF 30%, G2DD, mod MR (improved), mild/mod AI, AoV mean gradient 22 mmHg. Drips weaned and GDMT titrated, he was discharged home, weight 159 lbs.  Saw Pulmonology post op, scheduled bronch.  Today he returns for post hospital HF follow up. Overall feeling fine. No SOB with activity, walking around yard and to mailbox. Able to rake leaves in yard without dyspnea. Denies palpitations, abnormal bleeding, CP, dizziness, edema, or PND/Orthopnea. Appetite ok. Weight at home 155 pounds. Taking all medications. Quit smoking 04/19/24. Has PET scan  later today, EBUS scheduled for next month.  Cardiac Studies  - Echo (04/28/24) post op: EF 30%, G2DD, RV ok, moderate MR,  AVA 1.04 cm , AoV mean gradient 41.2 mmHg  - R/LHC (10/25) on DBA 2.5 + NE 10: 95% stenosis large RPLV branch; RA 4, PA 49/22 (32), PCWP 23, CO/CI (Fick) 4.42/2.64, PVR 2 WU, PAPi 6.75  - Echo 04/19/24: EF 20-25%, RV ok, moderate to severe MR, severe prosthetic AS with AVA 0.61 cm , AoV mean gradient 49 mmHg  Past Medical History:  Diagnosis Date   Arthritis    Asthma    CAD (coronary artery disease) 01/23/2016   minor CAD at cath-40% RCA   CHF (congestive heart failure) (HCC)    COPD (chronic obstructive pulmonary disease) (HCC)    denies patient said that a pulmonologist said he said that he didnt have COPD   HFrEF (heart failure with reduced ejection fraction) (HCC)    Hypertension    Non-ischemic cardiomyopathy (HCC) 01/23/2016   EF 30-35%   Pneumonia    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 Wendel and Dr. Daniel   Severe aortic stenosis    Current Outpatient Medications  Medication Sig Dispense Refill   acetaminophen  (TYLENOL ) 500 MG tablet Take 1,000 mg by mouth daily as needed for headache.     albuterol  (VENTOLIN  HFA) 108 (90 Base) MCG/ACT inhaler Inhale 2 puffs into the lungs every 4 (four) hours as needed for wheezing or shortness of breath.     apixaban  (ELIQUIS ) 5 MG TABS tablet Take 1 tablet (5 mg total) by mouth 2 (two) times daily. 60  tablet 6   atorvastatin  (LIPITOR) 10 MG tablet Take 1 tablet (10 mg total) by mouth daily. 90 tablet 3   empagliflozin  (JARDIANCE ) 10 MG TABS tablet Take 1 tablet (10 mg total) by mouth daily before breakfast. 30 tablet 11   finasteride  (PROSCAR ) 5 MG tablet Take 1 tablet (5 mg total) by mouth daily. 30 tablet 1   spironolactone  (ALDACTONE ) 25 MG tablet Take 1 tablet (25 mg total) by mouth daily.  90 tablet 2   tamsulosin  (FLOMAX ) 0.4 MG CAPS capsule Take 0.4 mg by mouth every evening.  1   torsemide  (DEMADEX ) 20 MG tablet Take 1 tablet (20 mg total) by mouth daily. 90 tablet 2   No current facility-administered medications for this visit.   Allergies  Allergen Reactions   No Known Allergies    Social History   Socioeconomic History   Marital status: Widowed    Spouse name: Not on file   Number of children: 4   Years of education: 10   Highest education level: Not on file  Occupational History   Occupation: holiday representative    Comment: Engineer, Mining  Tobacco Use   Smoking status: Former    Current packs/day: 0.00    Average packs/day: 1 pack/day for 48.0 years (48.0 ttl pk-yrs)    Types: Cigarettes    Start date: 01/23/1971    Quit date: 2025    Years since quitting: 0.9   Smokeless tobacco: Former   Tobacco comments:    Quit 04/19/24   Vaping Use   Vaping status: Former   Quit date: 08/28/2015  Substance and Sexual Activity   Alcohol use: Not Currently    Alcohol/week: 4.0 standard drinks of alcohol    Types: 4 Standard drinks or equivalent per week    Comment: has not drank alcohol in 3 months (05/31/24)   Drug use: No   Sexual activity: Not Currently  Other Topics Concern   Not on file  Social History Narrative   Not on file   Social Drivers of Health   Financial Resource Strain: Not on file  Food Insecurity: No Food Insecurity (05/06/2024)   Hunger Vital Sign    Worried About Running Out of Food in the Last Year: Never true    Ran Out of Food in the Last Year: Never true  Transportation Needs: No Transportation Needs (05/06/2024)   PRAPARE - Administrator, Civil Service (Medical): No    Lack of Transportation (Non-Medical): No  Physical Activity: Not on file  Stress: Not on file  Social Connections: Socially Isolated (04/19/2024)   Social Connection and Isolation Panel    Frequency of Communication with Friends and Family: More than  three times a week    Frequency of Social Gatherings with Friends and Family: Twice a week    Attends Religious Services: Never    Database Administrator or Organizations: No    Attends Banker Meetings: Never    Marital Status: Widowed  Intimate Partner Violence: Not At Risk (05/06/2024)   Humiliation, Afraid, Rape, and Kick questionnaire    Fear of Current or Ex-Partner: No    Emotionally Abused: No    Physically Abused: No    Sexually Abused: No   Family History  Problem Relation Age of Onset   Breast cancer Mother    Breast cancer Sister    Wt Readings from Last 3 Encounters:  06/03/24 73.9 kg (163 lb)  05/24/24 75.3 kg (166 lb)  05/14/24  71.2 kg (157 lb)   There were no vitals taken for this visit.  PHYSICAL EXAM: General:  NAD. No resp difficulty, walked into clinic HEENT: Normal Neck: Supple. No JVD. Cor: Regular rate & rhythm. No rubs, gallops, 2/6 SEM RSUB Lungs: Clear, diminished Abdomen: Soft, nontender, nondistended.  Extremities: No cyanosis, clubbing, rash, edema Neuro: Alert & oriented x 3, moves all 4 extremities w/o difficulty. Affect pleasant.  ECG (personally reviewed): SR with PVC  ASSESSMENT & PLAN: 1. Chronic systolic CHF  - Echo (10/25): EF 20-25%, moderate LV dilation,  RV normal, moderate-severe MR, bioprosthetic aortic valve with severe stenosis and mean gradient 49 with mild-moderate AI, IVC not dilated.  -  Prior echo had shown EF 45-50%.  In the past, EF dropped with severe AS.  Suspect this is the cause again.   - LHC in 2017 showed nonobstructive CAD,  - R/LHC this admission: 95% stenosis large RPLV branch.  On RHC, RA pressure was low but PCWP elevated.  - Intra-op echo with EF 25%, mild/mid MR - Echo 04/28/24: EF 30% w mod MR - NYHA II, volume OK - Continue torsemide  20 mg daily. - Continue spiro 12.5 mg daily - Continue Jardiance  10 mg daily - BP too low for ARB/ARNi - no ? blocker w/ low output  - Labs today. - Repeat  echo in 3-4 months - Discuss CR next visit   2. Aortic stenosis:  - Severe bioprosthetic AS with mild-moderate AI. He initially had AVR for bicuspid aortic valve. S - s/p VIV TAVR 04/27/24 - intra-op mean gradient 8 mmHg; formal post-op 22 mmHg with mod AI - Continue Eliquis  5 mg bid. No bleeding issues - CBC today - He has follow up with Structural Hear Team   3. Spiculated Left Apical Pulmonary Nodule/ Prostatomegaly - incidental findings on TAVR protocol CTA - PSA reassuring, normal at 1.9  mg/ml, can follow w/ outpatient urology  - Has PET and EBUS arranged, following with Pulm and Onc   4. CAD:  - LHC (10/25): 95% stenosis in large RPLV branch.  For the time being, will manage medically.  - No chest pain - Continue Eliquis , no ASA - Continue atorvastatin  10 mg daily   5. Atrial fibrillation:  - Paroxysmal.  - NSR on ECG today - Continue Eliquis  5 mg bid   6 PAD:  - History of peripheral intervention in 2018.     7. Smoking/suspected COPD:  - Quit 03/2024. - Congratulated.  Follow up in 3-4 weeks with APP.  Harlene Gainer, FNP-BC 06/07/24

## 2024-06-08 ENCOUNTER — Telehealth: Payer: Self-pay | Admitting: Physician Assistant

## 2024-06-08 ENCOUNTER — Encounter: Payer: Self-pay | Admitting: Medical Oncology

## 2024-06-08 LAB — CYTOLOGY - NON PAP

## 2024-06-08 NOTE — Progress Notes (Signed)
  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    Cancer Staging  No matching staging information was found for the patient.   SDOH Screening and Interventions Updated:  No  SDOH Screenings   Food Insecurity: No Food Insecurity (05/06/2024)  Housing: Low Risk  (05/06/2024)  Transportation Needs: No Transportation Needs (05/06/2024)  Utilities: Not At Risk (05/06/2024)  Depression (PHQ2-9): Low Risk  (05/06/2024)  Social Connections: Socially Isolated (04/19/2024)  Tobacco Use: Medium Risk (06/03/2024)     Genetics Assessment Completed:  No Genetics Referral Made:  no  Care Team Updated:  Yes  Colene KYM Golden, RN, BSN Oncology Nurse Navigator, [facility] 06/08/2024 3:08 PM

## 2024-06-08 NOTE — Telephone Encounter (Signed)
 I called Mr. [name] to review the biopsy results from 06/03/2024. 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 Patient will follow up with Dr. Sherrod, thoracic medical oncologist, on 06/16/24 to further discuss diagnosis and  treatment recommendations. Mr. Twist expressed understanding of the plan provided.

## 2024-06-09 ENCOUNTER — Encounter: Payer: Self-pay | Admitting: Acute Care

## 2024-06-09 ENCOUNTER — Ambulatory Visit: Admitting: Acute Care

## 2024-06-09 VITALS — BP 113/73 | HR 72 | Temp 98.6°F | Ht 69.0 in | Wt 169.0 lb

## 2024-06-09 DIAGNOSIS — Z87891 Personal history of nicotine dependence: Secondary | ICD-10-CM

## 2024-06-09 DIAGNOSIS — C3412 Malignant neoplasm of upper lobe, left bronchus or lung: Secondary | ICD-10-CM

## 2024-06-09 DIAGNOSIS — Z9889 Other specified postprocedural states: Secondary | ICD-10-CM

## 2024-06-09 DIAGNOSIS — I509 Heart failure, unspecified: Secondary | ICD-10-CM

## 2024-06-09 DIAGNOSIS — R911 Solitary pulmonary nodule: Secondary | ICD-10-CM

## 2024-06-09 DIAGNOSIS — C3492 Malignant neoplasm of unspecified part of left bronchus or lung: Secondary | ICD-10-CM

## 2024-06-09 DIAGNOSIS — R9389 Abnormal findings on diagnostic imaging of other specified body structures: Secondary | ICD-10-CM

## 2024-06-09 DIAGNOSIS — R942 Abnormal results of pulmonary function studies: Secondary | ICD-10-CM

## 2024-06-09 DIAGNOSIS — Z7901 Long term (current) use of anticoagulants: Secondary | ICD-10-CM

## 2024-06-09 DIAGNOSIS — Z8679 Personal history of other diseases of the circulatory system: Secondary | ICD-10-CM

## 2024-06-09 DIAGNOSIS — Z952 Presence of prosthetic heart valve: Secondary | ICD-10-CM

## 2024-06-09 NOTE — Patient Instructions (Addendum)
 It is good to see you today. I am glad you have done well after the bronchoscopy with biopsies. We have reviewed your biopsy results. The biopsy of the left upper lobe was positive for non small cell lung cancer.  You have a follow up with Dr. Sherrod 06/16/2024. He will review treatment options with you. I have also referred you to radiation oncology, as you have told me you will not consider chemotherapy. Please be open minded when you go to see Dr. Sherrod. You will get a call to schedule your consult with radiation oncology. Schedule this for after your visit with Dr. Sherrod Lanius have already had MRI of the brain and PET scan , so staging is complete. Metta Sours with treatment . They will take great care of you at the cancer center.  Call if you need us  for anything. Congratulations on quitting smoking. Keep up the good work.  Please contact office for sooner follow up if symptoms do not improve or worsen or seek emergency care

## 2024-06-09 NOTE — Progress Notes (Signed)
 History of Present Illness [name] is a 68 y.o. male former smoker , quit October 2025 , with  a 48-pack-year smoking history.  Referred to Dr. Zaida for evaluation of a pulmonary nodule.  Synopsis 68 year old male recently quit heavy smoker with COPD recently hospitalized after undergoing an aortic valve replacement due to malfunctioning heart valve and heart strain.  Recent procedure was a valve in valve TAVR. During hospitalization a CT chest revealed a left upper lobe lobe nodule.  PET scan was scheduled to further evaluate the nodule.  PET scan done 05/14/2024 showed the spiculated left upper lobe pulmonary nodule was hypermetabolic and consistent with primary bronchogenic carcinoma.  There was no evidence of metastatic disease. Patient was scheduled for robotic assisted navigational bronchoscopy with biopsies on 06/04/2023 to be done by Dr. Zaida.  Patient is here today to follow-up after bronchoscopy with biopsies to discuss tissue results, ensure referral for appropriate treatment and to ensure he is done well postprocedure.   06/09/2024 Discussed the use of AI scribe software for clinical note transcription with the patient, who gave verbal consent to proceed.  History of Present Illness Patient presents for follow-up after bronchoscopy with biopsies. Patient states he has done well after the procedure.  He does endorse a sore throat that resolved on its own, however has had no bleeding , fever, worsening dyspnea, or adverse reaction to the anesthesia.  We have discussed the results of his biopsy.  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  Results were shared with the patient by an oncology NP who called to schedule the patient with Dr. Gatha 06/08/2024.  While reviewing options for treatment with the patient he shared with me that his wife recently passed away from cancer which initially started in the right lung  and then metastasized to the liver and other body organs over about a 45-month period of time.  He states that he is not interested in having any kind of chemotherapy as he feels he watched her slowly die as a result of the treatment.  I have asked him to be open minded when he talks to Dr. Sherrod, as his wife had small cell lung cancer which is very different from the non-small cell cancer that he has been diagnosed with.  I have also referred him to radiation oncology as that is a treatment modality that he is agreeable with considering for his treatment.  Patient has already had MRI brain and PET scan so in that regard staging is complete.  He has follow-up with Dr. Sherrod 06/16/2024.  I congratulated the patient for quitting smoking.  He is followed through the advanced [facility] failure clinic and he has concerns about his recent heart issues impacting his ability to get treatment for his lung cancer.  I am unsure if patient's recent valve in valve TAVR and chronic heart failure will eliminate surgery as an option for treatment.  Test Results: Cytology 06/03/2024 LUNG, LUL, FINE NEEDLE ASPIRATION:  Non-small cell carcinoma  See comment   B. LUNG, LUL, LAVAGE:    FINAL MICROSCOPIC DIAGNOSIS:  - Non-small cell carcinoma  - See comment   PET scan 05/14/2024 The spiculated left upper lobe pulmonary nodule is hypermetabolic, consistent with primary bronchogenic carcinoma. 2. No evidence of metastatic disease. 3. Previously demonstrated focal airspace disease or ill-defined ground-glass nodule in the right upper lobe appears less conspicuous and demonstrates no hypermetabolic activity, favoring an inflammatory or other  benign etiology. Recommend attention on follow-up. 4. Probable small incidental hypermetabolic nodule in the deep lobe of the right parotid gland. 5. Asymmetric activity in the right nasopharynx without corresponding abnormality on the CT images, possibly  muscular. Consider correlation with direct visualization. 6. Aortic Atherosclerosis (ICD10-I70.0) and Emphysema (ICD10-J43.9).      Latest Ref Rng & Units 05/14/2024    3:17 PM 05/06/2024   10:01 AM 05/02/2024    3:40 AM  CBC  WBC 4.0 - 10.5 K/uL 6.3  10.3  6.8   Hemoglobin 13.0 - 17.0 g/dL 85.7  85.7  87.6   Hematocrit 39.0 - 52.0 % 42.0  42.4  36.7   Platelets 150 - 400 K/uL 135  175  157        Latest Ref Rng & Units 05/14/2024    3:17 PM 05/06/2024   10:01 AM 05/03/2024    5:00 AM  BMP  Glucose 70 - 99 mg/dL 89  95  92   BUN 8 - 23 mg/dL 24  25  21    Creatinine 0.61 - 1.24 mg/dL 9.23  9.22  9.29   Sodium 135 - 145 mmol/L 138  139  137   Potassium 3.5 - 5.1 mmol/L 4.2  4.1  4.0   Chloride 98 - 111 mmol/L 101  101  100   CO2 22 - 32 mmol/L 26  31  27    Calcium  8.9 - 10.3 mg/dL 9.4  9.9  9.0     BNP    Component Value Date/Time   BNP 205.4 (H) 05/14/2024 1517   BNP 1,100.6 (H) 01/19/2016 1029    ProBNP    Component Value Date/Time   PROBNP 195 01/10/2017 0941    PFT    Component Value Date/Time   FEV1PRE 2.35 01/26/2016 0834   FEV1POST 2.28 01/26/2016 0834   FVCPRE 3.08 01/26/2016 0834   FVCPOST 3.01 01/26/2016 0834   TLC 5.73 01/26/2016 0834   DLCOUNC 20.60 01/26/2016 0834   PREFEV1FVCRT 76 01/26/2016 0834   PSTFEV1FVCRT 76 01/26/2016 0834    DG Chest Port 1 View Result Date: 06/03/2024 EXAM: 1 VIEW(S) XRAY OF THE CHEST 06/03/2024 11:54:00 AM COMPARISON: 04/26/2024 CLINICAL HISTORY: S/P bronchoscopy FINDINGS: LUNGS AND PLEURA: Ill-definition of the known left apical pulmonary nodule. Emphysema. No visible pneumothorax. No pleural effusion. HEART AND MEDIASTINUM: TAVR in place. No acute abnormality of the cardiac and mediastinal silhouettes. BONES AND SOFT TISSUES: Prior median sternotomy. Spurring and irregularity along the right coracoclavicular ligament attachments. IMPRESSION: 1. Ill-definition of the known left apical pulmonary nodule. 2. Emphysema. 3.  Several chronic and incidental findings are present, including prior TAVR and median sternotomy, and spurring with irregularity along the right coracoclavicular ligament attachments. Electronically signed by: Ryan Salvage MD 06/03/2024 12:30 PM EST RP Workstation: [id number]   DG C-ARM BRONCHOSCOPY Result Date: 06/03/2024 C-ARM BRONCHOSCOPY: Fluoroscopy was utilized by the requesting physician.  No radiographic interpretation.   DG C-Arm 1-60 Min-No Report Result Date: 06/03/2024 Fluoroscopy was utilized by the requesting physician.  No radiographic interpretation.   DG C-Arm 1-60 Min-No Report Result Date: 06/03/2024 Fluoroscopy was utilized by the requesting physician.  No radiographic interpretation.   ECHOCARDIOGRAM COMPLETE Result Date: 05/19/2024    ECHOCARDIOGRAM REPORT   Patient Name:   [name] Date of Exam: 05/19/2024 Medical Rec #:  [id number]       Height:       70.0 in Accession #:    [id number]      Weight:  157.0 lb Date of Birth:  1955-11-16       BSA:          1.883 m Patient Age:    68 years        BP:           116/68 mmHg Patient Gender: M               HR:           59 bpm. Exam Location:  [address] Procedure: 2D Echo, Color Doppler, Cardiac Doppler and 3D Echo (Both Spectral            and Color Flow Doppler were utilized during procedure). Indications:    LV Dysfunction I50.22  History:        Patient has prior history of Echocardiogram examinations, most                 recent 04/28/2024. CHF, CAD, COPD; Risk Factors:Hypertension.                 Aortic Valve: 26 mm Edwards Sapien prosthetic, stented (TAVR)                 valve is present in the aortic position. Procedure Date:                 04/27/2024.  Sonographer:    Augustin Seals RDCS Referring Phys: [phone] KENNETH C HILTY IMPRESSIONS  1. Left ventricular ejection fraction, by estimation, is 35 to 40%. Left ventricular ejection fraction by 3D volume is 35 %. The left ventricle has moderately decreased  function. The left ventricle demonstrates global hypokinesis. The left ventricular internal cavity size was moderately dilated. Left ventricular diastolic parameters are consistent with Grade III diastolic dysfunction (restrictive). Elevated left atrial pressure.  2. Right ventricular systolic function is normal. The right ventricular size is mildly enlarged.  3. Left atrial size was severely dilated.  4. Right atrial size was mildly dilated.  5. The mitral valve is normal in structure. Mild mitral valve regurgitation. No evidence of mitral stenosis.  6. The aortic valve has been repaired/replaced. Aortic valve regurgitation is not visualized. No aortic stenosis is present. There is a 26 mm Edwards Sapien prosthetic (TAVR) valve present in the aortic position. Procedure Date: 04/27/2024. Echo findings are consistent with normal structure and function of the aortic valve prosthesis. Aortic valve area, by VTI measures 2.27 cm. Aortic valve mean gradient measures 20.0 mmHg. Aortic valve Vmax measures 3.06 m/s.  7. The inferior vena cava is normal in size with greater than 50% respiratory variability, suggesting right atrial pressure of 3 mmHg. FINDINGS  Left Ventricle: Left ventricular ejection fraction, by estimation, is 35 to 40%. Left ventricular ejection fraction by 3D volume is 35 %. The left ventricle has moderately decreased function. The left ventricle demonstrates global hypokinesis. The left ventricular internal cavity size was moderately dilated. There is no left ventricular hypertrophy. Left ventricular diastolic parameters are consistent with Grade III diastolic dysfunction (restrictive). Elevated left atrial pressure. Right Ventricle: The right ventricular size is mildly enlarged. No increase in right ventricular wall thickness. Right ventricular systolic function is normal. Left Atrium: Left atrial size was severely dilated. Right Atrium: Right atrial size was mildly dilated. Pericardium: There is no  evidence of pericardial effusion. Mitral Valve: The mitral valve is normal in structure. Mild mitral valve regurgitation. No evidence of mitral valve stenosis. Tricuspid Valve: The tricuspid valve is normal in structure. Tricuspid valve regurgitation is not  demonstrated. No evidence of tricuspid stenosis. Aortic Valve: Mildly elevated gradients. Normal EOA index and DVI 0.5 suggestive of high flow state. The aortic valve has been repaired/replaced. Aortic valve regurgitation is not visualized. No aortic stenosis is present. Aortic valve mean gradient measures 20.0 mmHg. Aortic valve peak gradient measures 37.5 mmHg. Aortic valve area, by VTI measures 2.27 cm. There is a 26 mm Edwards Sapien prosthetic, stented (TAVR) valve present in the aortic position. Procedure Date: 04/27/2024. Echo findings are  consistent with normal structure and function of the aortic valve prosthesis. Pulmonic Valve: The pulmonic valve was normal in structure. Pulmonic valve regurgitation is not visualized. No evidence of pulmonic stenosis. Aorta: The aortic root is normal in size and structure. Venous: The inferior vena cava is normal in size with greater than 50% respiratory variability, suggesting right atrial pressure of 3 mmHg. IAS/Shunts: No atrial level shunt detected by color flow Doppler. Additional Comments: 3D was performed not requiring image post processing on an independent workstation and was abnormal.  LEFT VENTRICLE PLAX 2D LVIDd:         6.60 cm         Diastology LVIDs:         5.10 cm         LV e' medial:    4.70 cm/s LV PW:         1.20 cm         LV E/e' medial:  24.9 LV IVS:        1.10 cm         LV e' lateral:   9.08 cm/s LVOT diam:     2.40 cm         LV E/e' lateral: 12.9 LV SV:         150 LV SV Index:   80 LVOT Area:     4.52 cm        3D Volume EF                                LV 3D EF:    Left                                             ventricul                                             ar                                              ejection                                             fraction                                             by 3D  volume is                                             35 %.                                 3D Volume EF:                                3D EF:        35 %                                LV EDV:       271 ml                                LV ESV:       176 ml                                LV SV:        95 ml RIGHT VENTRICLE             IVC RV Basal diam:  4.40 cm     IVC diam: 1.60 cm RV Mid diam:    3.40 cm RV S prime:     11.30 cm/s  PULMONARY VEINS TAPSE (M-mode): 1.8 cm      A Reversal Velocity: 33.40 cm/s                             Diastolic Velocity:  42.20 cm/s                             S/D Velocity:        1.20                             Systolic Velocity:   50.50 cm/s LEFT ATRIUM              Index        RIGHT ATRIUM           Index LA diam:        4.30 cm  2.28 cm/m   RA Area:     21.80 cm LA Vol (A2C):   81.7 ml  43.38 ml/m  RA Volume:   64.40 ml  34.20 ml/m LA Vol (A4C):   104.0 ml 55.22 ml/m LA Biplane Vol: 92.1 ml  48.90 ml/m  AORTIC VALVE AV Area (Vmax):    2.25 cm AV Area (Vmean):   2.23 cm AV Area (VTI):     2.27 cm AV Vmax:           306.00 cm/s AV Vmean:          207.000 cm/s AV VTI:            0.662 m AV Peak Grad:      37.5 mmHg AV Mean Grad:  20.0 mmHg LVOT Vmax:         152.00 cm/s LVOT Vmean:        102.000 cm/s LVOT VTI:          0.332 m LVOT/AV VTI ratio: 0.50  AORTA Ao Root diam: 3.30 cm Ao Asc diam:  3.60 cm MITRAL VALVE MV Area (PHT): 3.12 cm       SHUNTS MV Decel Time: 243 msec       Systemic VTI:  0.33 m MR Peak grad:    98.0 mmHg    Systemic Diam: 2.40 cm MR Mean grad:    68.0 mmHg MR Vmax:         495.00 cm/s MR Vmean:        394.0 cm/s MR PISA:         1.57 cm MR PISA Eff ROA: 10 mm MR PISA Radius:  0.50 cm MV E velocity: 117.00 cm/s MV A velocity: 42.80 cm/s MV E/A ratio:  2.73  Morene Brownie Electronically signed by Morene Brownie Signature Date/Time: 05/19/2024/3:03:14 PM    Final    NM PET Image Initial (PI) Skull Base To Thigh Result Date: 05/14/2024 CLINICAL DATA:  Initial treatment strategy for spiculated left lung nodule. EXAM: NUCLEAR MEDICINE PET SKULL BASE TO THIGH TECHNIQUE: 7.79 mCi F-18 FDG was injected intravenously. Full-ring PET imaging was performed from the skull base to thigh after the radiotracer. CT data was obtained and used for attenuation correction and anatomic localization. Fasting blood glucose: 94 mg/dl COMPARISON:  CT of the chest, abdomen and pelvis 04/21/2024. FINDINGS: Mediastinal blood pool activity: SUV max 1.9 NECK: No hypermetabolic cervical lymph nodes are identified.Probable small incidental hypermetabolic nodule in the deep lobe of the right parotid gland (SUV max 6.6). There is asymmetric activity within the right nasopharynx (SUV max 5.4) without corresponding abnormality on the CT images. No other suspicious activity identified within the pharyngeal mucosal space. Incidental CT findings: Bilateral carotid atherosclerosis. CHEST: There are no hypermetabolic mediastinal, hilar or axillary lymph nodes. There is intense hypermetabolic activity within the previously demonstrated spiculated left upper lobe nodule. This has an SUV max of 7.6 and measures 2.3 x 1.4 cm on image 14/7. Previously demonstrated focal airspace disease or ill-defined ground-glass nodule in the right upper lobe appears less conspicuous, now measuring 1.7 cm on image 44/7. No associated hypermetabolic activity. No other hypermetabolic pulmonary activity or suspicious nodularity. Incidental CT findings: Atherosclerosis of the aorta, great vessels and coronary arteries status post aortic valve replacement and median sternotomy. Moderate centrilobular and paraseptal emphysema. The previously demonstrated pleural effusions and bibasilar atelectasis have resolved. ABDOMEN/PELVIS:  There is no hypermetabolic activity within the liver, adrenal glands, spleen or pancreas. There is no hypermetabolic nodal activity in the abdomen or pelvis. Incidental CT findings: Stable findings of mild adrenal hyperplasia bilaterally; no specific follow-up imaging recommended. Aortic and branch vessel atherosclerosis. Diverticular changes in the sigmoid colon. The bladder is trabeculated with a left-sided diverticulum. The prostate gland is moderately enlarged. Scrotal hydrocele again noted. SKELETON: There is no hypermetabolic activity to suggest osseous metastatic disease. Asymmetric left suboccipital muscular activity. Incidental CT findings: Healed median sternotomy. Multilevel spondylosis. IMPRESSION: 1. The spiculated left upper lobe pulmonary nodule is hypermetabolic, consistent with primary bronchogenic carcinoma. 2. No evidence of metastatic disease. 3. Previously demonstrated focal airspace disease or ill-defined ground-glass nodule in the right upper lobe appears less conspicuous and demonstrates no hypermetabolic activity, favoring an inflammatory or other benign etiology. Recommend attention on follow-up. 4. Probable small  incidental hypermetabolic nodule in the deep lobe of the right parotid gland. 5. Asymmetric activity in the right nasopharynx without corresponding abnormality on the CT images, possibly muscular. Consider correlation with direct visualization. 6. Aortic Atherosclerosis (ICD10-I70.0) and Emphysema (ICD10-J43.9). Electronically Signed   By: Elsie Perone M.D.   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 Wendel and Dr. Daniel   Severe aortic stenosis      Social History   Tobacco Use   Smoking status: Former    Current packs/day: 0.00    Average packs/day: 1 pack/day for 48.0 years (48.0 ttl pk-yrs)    Types: Cigarettes    Start date: 01/23/1971    Quit date: 2025    Years since quitting: 0.9   Smokeless tobacco: Former   Tobacco comments:    Quit 04/19/24   Vaping Use   Vaping status: Former   Quit date: 08/28/2015  Substance Use Topics   Alcohol use: Not Currently    Alcohol/week: 4.0 standard drinks of alcohol    Types: 4 Standard drinks or equivalent per week    Comment: has not drank alcohol in 3 months (05/31/24)   Drug use: No    Mr.Fonseca reports that he quit smoking about 11 months ago. His smoking use included cigarettes. He started smoking about 53 years ago. He has a 48 pack-year smoking history. He has quit using smokeless tobacco. He reports that he does not currently use alcohol after a past usage of about 4.0 standard drinks of alcohol per week. He reports that he does not use drugs.  Tobacco Cessation: Counseling given: Not Answered Tobacco comments: Quit 04/19/24  Former smoker quit 04/19/2024 with a 48-pack-year smoking history  Past surgical hx, Family hx, Social hx all reviewed.  Current Outpatient Medications on File Prior to Visit  Medication Sig   acetaminophen  (TYLENOL ) 500 MG tablet Take 1,000 mg by mouth daily as needed for headache.   albuterol  (VENTOLIN  HFA) 108 (90 Base) MCG/ACT inhaler Inhale 2 puffs into the lungs every 4 (four) hours as needed for wheezing or shortness of breath.   apixaban  (ELIQUIS ) 5 MG TABS tablet Take 1 tablet (5 mg total) by mouth 2 (two) times daily.   atorvastatin  (LIPITOR) 10 MG  tablet Take 1 tablet (10 mg total) by mouth daily.   empagliflozin  (JARDIANCE ) 10 MG TABS tablet Take 1 tablet (10 mg total) by mouth daily before breakfast.   finasteride  (PROSCAR ) 5 MG tablet Take 1 tablet (5 mg total) by mouth daily.   spironolactone  (ALDACTONE ) 25 MG tablet Take 1 tablet (25 mg total) by mouth daily.   tamsulosin  (FLOMAX ) 0.4 MG CAPS capsule Take 0.4 mg by mouth every evening.   torsemide  (DEMADEX ) 20 MG tablet Take 1 tablet (20 mg total) by  mouth daily.   No current facility-administered medications on file prior to visit.     No Known Allergies  Review Of Systems:  Constitutional:   No  weight loss, night sweats,  Fevers, chills, fatigue, or  lassitude.  HEENT:   No headaches,  Difficulty swallowing,  Tooth/dental problems, or  Sore throat,                No sneezing, itching, ear ache, nasal congestion, post nasal drip,   CV:  No chest pain,  Orthopnea, PND, swelling in lower extremities, anasarca, dizziness, palpitations, syncope.   GI  No heartburn, indigestion, abdominal pain, nausea, vomiting, diarrhea, change in bowel habits, loss of appetite, bloody stools.   Resp:  + shortness of breath with exertion less at rest.  No excess mucus, no productive cough,  No non-productive cough,  No coughing up of blood.  No change in color of mucus.  No wheezing.  No chest wall deformity, baseline excess mucus, productive cough, occasional baseline wheezing  Skin: no rash or lesions.  GU: no dysuria, change in color of urine, no urgency or frequency.  No flank pain, no hematuria   MS:  No joint pain or swelling.  No decreased range of motion.  No back pain.  Psych:  No change in mood or affect. No depression or anxiety.  No memory loss.   Vital Signs BP 113/73   Pulse 72   Temp 98.6 F (37 C) (Oral)   Ht 5' 9 (1.753 m)   Wt 169 lb (76.7 kg)   SpO2 95%   BMI 24.96 kg/m    Physical Exam:  General- No distress,  A&Ox3, pleasant and appropriate ENT: No  sinus tenderness, TM clear, pale nasal mucosa, no oral exudate,no post nasal drip, no LAN Cardiac: S1, S2, regular rate and rhythm, no murmur Chest: No wheeze/ rales/ dullness; no accessory muscle use, no nasal flaring, no sternal retractions Abd.: Soft Non-tender, nondistended, bowel sounds positive, Body mass index is 24.96 kg/m.  Ext: No clubbing cyanosis, edema, no obvious deformities Neuro:  normal strength, moving all extremities x 4, alert and oriented x 3, very appropriate Skin: No rashes, warm and dry, no obvious skin lesions Psych: normal mood and behavior  Physical Exam    Assessment/Plan New diagnosis non-small cell lung cancer, left upper lobe PET scan and MRI brain completed Former smoker quit 04/19/2024 with a 48-pack-year smoking history Chronic heart failure Recent valve in valve TAVR Plan I am glad you have done well after the bronchoscopy with biopsies. We have reviewed your biopsy results. The biopsy of the left upper lobe was positive for non small cell lung cancer.  You have a follow up with Dr. Sherrod 06/16/2024. He will review treatment options with you. I have also referred you to radiation oncology, as you have told me you will not consider chemotherapy. Please be open minded when you go to see Dr. Sherrod. You will get a call to schedule your consult with radiation oncology. Schedule this for after your visit with Dr. Sherrod Lanius have already had MRI of the brain and PET scan , so staging is complete. Metta Sours with treatment . They will take great care of you at the cancer center.  Call if you need us  for anything. Congratulations on quitting smoking. Keep up the good work.  Please contact office for sooner follow up if symptoms do not improve or worsen or seek emergency care    I spent 30 minutes  dedicated to the care of this patient on the date of this encounter to include pre-visit review of records, face-to-face time with the patient discussing  conditions above, post visit ordering of testing, clinical documentation with the electronic health record, making appropriate referrals as documented, and communicating necessary information to the patient's healthcare team.    Assessment & Plan        Lauraine JULIANNA Lites, NP 06/09/2024  11:30 AM

## 2024-06-10 ENCOUNTER — Other Ambulatory Visit: Payer: Self-pay

## 2024-06-10 ENCOUNTER — Telehealth (HOSPITAL_COMMUNITY): Payer: Self-pay

## 2024-06-10 NOTE — Telephone Encounter (Signed)
 Called to confirm/remind patient of their appointment at [facility] on 06/11/24.   Appointment:   [x] Confirmed  [] Left mess   [] No answer/No voice mail  [] VM Full/unable to leave message  [] Phone not in service  Patient reminded to bring all medications and/or complete list.  Confirmed patient has transportation. Gave directions, instructed to utilize valet parking.

## 2024-06-11 ENCOUNTER — Ambulatory Visit (HOSPITAL_COMMUNITY)
Admission: RE | Admit: 2024-06-11 | Discharge: 2024-06-11 | Disposition: A | Source: Ambulatory Visit | Attending: Family Medicine

## 2024-06-11 ENCOUNTER — Encounter (HOSPITAL_COMMUNITY): Payer: Self-pay

## 2024-06-11 ENCOUNTER — Ambulatory Visit (HOSPITAL_COMMUNITY): Payer: Self-pay | Admitting: Family Medicine

## 2024-06-11 VITALS — BP 112/80 | HR 66 | Ht 69.0 in | Wt 169.2 lb

## 2024-06-11 DIAGNOSIS — Z952 Presence of prosthetic heart valve: Secondary | ICD-10-CM | POA: Diagnosis not present

## 2024-06-11 DIAGNOSIS — I5022 Chronic systolic (congestive) heart failure: Secondary | ICD-10-CM | POA: Diagnosis not present

## 2024-06-11 DIAGNOSIS — I739 Peripheral vascular disease, unspecified: Secondary | ICD-10-CM | POA: Diagnosis not present

## 2024-06-11 DIAGNOSIS — Z87891 Personal history of nicotine dependence: Secondary | ICD-10-CM | POA: Diagnosis not present

## 2024-06-11 DIAGNOSIS — I480 Paroxysmal atrial fibrillation: Secondary | ICD-10-CM | POA: Diagnosis not present

## 2024-06-11 DIAGNOSIS — C3492 Malignant neoplasm of unspecified part of left bronchus or lung: Secondary | ICD-10-CM | POA: Diagnosis not present

## 2024-06-11 DIAGNOSIS — I2510 Atherosclerotic heart disease of native coronary artery without angina pectoris: Secondary | ICD-10-CM

## 2024-06-11 LAB — BASIC METABOLIC PANEL WITH GFR
Anion gap: 7 (ref 5–15)
BUN: 24 mg/dL — ABNORMAL HIGH (ref 8–23)
CO2: 27 mmol/L (ref 22–32)
Calcium: 9 mg/dL (ref 8.9–10.3)
Chloride: 104 mmol/L (ref 98–111)
Creatinine, Ser: 0.72 mg/dL (ref 0.61–1.24)
GFR, Estimated: >60 mL/min (ref >60)
Glucose, Bld: 110 mg/dL — ABNORMAL HIGH (ref 70–99)
Potassium: 4.2 mmol/L (ref 3.5–5.1)
Sodium: 138 mmol/L (ref 135–145)

## 2024-06-11 LAB — BRAIN NATRIURETIC PEPTIDE: B Natriuretic Peptide: 106.9 pg/mL — ABNORMAL HIGH (ref 0.0–100.0)

## 2024-06-11 MED ORDER — LOSARTAN POTASSIUM 25 MG PO TABS: 12.5000 mg | ORAL_TABLET | Freq: Every day | ORAL | 3 refills | Status: AC

## 2024-06-11 NOTE — Patient Instructions (Signed)
 START Losartan  12.5 mg ( 1/2 Tab) daily.  Labs done today, your results will be available in MyChart, we will contact you for abnormal readings.  Your physician recommends that you schedule a follow-up appointment in: 3 weeks.  If you have any questions or concerns before your next appointment please send us  a message through [place] or call our office at [phone].    TO LEAVE A MESSAGE FOR THE NURSE SELECT OPTION 2, PLEASE LEAVE A MESSAGE INCLUDING: YOUR NAME DATE OF BIRTH CALL BACK NUMBER REASON FOR CALL**this is important as we prioritize the call backs  YOU WILL RECEIVE A CALL BACK THE SAME DAY AS LONG AS YOU CALL BEFORE 4:00 PM  At [facility], you and your health needs are our priority. As part of our continuing mission to provide you with exceptional heart care, we have created designated Provider Care Teams. These Care Teams include your primary Cardiologist (physician) and Advanced Practice Providers (APPs- Physician Assistants and Nurse Practitioners) who all work together to provide you with the care you need, when you need it.   You may see any of the following providers on your designated Care Team at your next follow up: Dr Toribio Fuel Dr Ezra Shuck Dr. Morene Brownie Greig Mosses, NP Caffie Shed, [place] [facility] [place], [place] Beckey Coe, NP Jordan Lee, NP Ellouise Class, NP Tinnie Redman, PharmD Jaun Bash, PharmD   Please be sure to bring in all your medications bottles to every appointment.    Thank you for choosing [facility]

## 2024-06-12 NOTE — Progress Notes (Signed)
 The proposed treatment discussed in conference is for discussion purpose only and is not a binding recommendation.  The patients have not been physically examined, or presented with their treatment options.  Therefore, final treatment plans cannot be decided.

## 2024-06-15 ENCOUNTER — Other Ambulatory Visit: Payer: Self-pay

## 2024-06-15 DIAGNOSIS — R911 Solitary pulmonary nodule: Secondary | ICD-10-CM

## 2024-06-16 ENCOUNTER — Inpatient Hospital Stay: Attending: Physician Assistant | Admitting: Internal Medicine

## 2024-06-16 ENCOUNTER — Inpatient Hospital Stay

## 2024-06-16 VITALS — HR 73 | Temp 97.8°F | Resp 17 | Ht 69.0 in | Wt 166.0 lb

## 2024-06-16 DIAGNOSIS — I509 Heart failure, unspecified: Secondary | ICD-10-CM | POA: Insufficient documentation

## 2024-06-16 DIAGNOSIS — C3490 Malignant neoplasm of unspecified part of unspecified bronchus or lung: Secondary | ICD-10-CM | POA: Diagnosis not present

## 2024-06-16 DIAGNOSIS — C3412 Malignant neoplasm of upper lobe, left bronchus or lung: Secondary | ICD-10-CM | POA: Diagnosis present

## 2024-06-16 DIAGNOSIS — R911 Solitary pulmonary nodule: Secondary | ICD-10-CM

## 2024-06-16 DIAGNOSIS — I428 Other cardiomyopathies: Secondary | ICD-10-CM | POA: Diagnosis not present

## 2024-06-16 DIAGNOSIS — Z87891 Personal history of nicotine dependence: Secondary | ICD-10-CM | POA: Insufficient documentation

## 2024-06-16 DIAGNOSIS — I110 Hypertensive heart disease with heart failure: Secondary | ICD-10-CM | POA: Diagnosis not present

## 2024-06-16 DIAGNOSIS — L989 Disorder of the skin and subcutaneous tissue, unspecified: Secondary | ICD-10-CM | POA: Insufficient documentation

## 2024-06-16 DIAGNOSIS — Z803 Family history of malignant neoplasm of breast: Secondary | ICD-10-CM | POA: Diagnosis not present

## 2024-06-16 DIAGNOSIS — Z85828 Personal history of other malignant neoplasm of skin: Secondary | ICD-10-CM | POA: Diagnosis not present

## 2024-06-16 LAB — CBC WITH DIFFERENTIAL (CANCER CENTER ONLY)
Abs Immature Granulocytes: 0.01 K/uL (ref 0.00–0.07)
Basophils Absolute: 0 K/uL (ref 0.0–0.1)
Basophils Relative: 0 %
Eosinophils Absolute: 0 K/uL (ref 0.0–0.5)
Eosinophils Relative: 1 %
HCT: 42 % (ref 39.0–52.0)
Hemoglobin: 14.3 g/dL (ref 13.0–17.0)
Immature Granulocytes: 0 %
Lymphocytes Relative: 22 %
Lymphs Abs: 1.6 K/uL (ref 0.7–4.0)
MCH: 32 pg (ref 26.0–34.0)
MCHC: 34 g/dL (ref 30.0–36.0)
MCV: 94 fL (ref 80.0–100.0)
Monocytes Absolute: 0.3 K/uL (ref 0.1–1.0)
Monocytes Relative: 4 %
Neutro Abs: 5.3 K/uL (ref 1.7–7.7)
Neutrophils Relative %: 73 %
Platelet Count: 105 K/uL — ABNORMAL LOW (ref 150–400)
RBC: 4.47 MIL/uL (ref 4.22–5.81)
RDW: 12.4 % (ref 11.5–15.5)
WBC Count: 7.3 K/uL (ref 4.0–10.5)
nRBC: 0 % (ref 0.0–0.2)

## 2024-06-16 LAB — CMP (CANCER CENTER ONLY)
ALT: 34 U/L (ref 0–44)
AST: 29 U/L (ref 15–41)
Albumin: 4.5 g/dL (ref 3.5–5.0)
Alkaline Phosphatase: 66 U/L (ref 38–126)
Anion gap: 11 (ref 5–15)
BUN: 25 mg/dL — ABNORMAL HIGH (ref 8–23)
CO2: 28 mmol/L (ref 22–32)
Calcium: 9.4 mg/dL (ref 8.9–10.3)
Chloride: 100 mmol/L (ref 98–111)
Creatinine: 0.85 mg/dL (ref 0.61–1.24)
GFR, Estimated: >60 mL/min (ref >60)
Glucose, Bld: 153 mg/dL — ABNORMAL HIGH (ref 70–99)
Potassium: 4.1 mmol/L (ref 3.5–5.1)
Sodium: 139 mmol/L (ref 135–145)
Total Bilirubin: 0.3 mg/dL (ref 0.0–1.2)
Total Protein: 7.5 g/dL (ref 6.5–8.1)

## 2024-06-16 NOTE — Progress Notes (Signed)
 [facility] [phone]   [phone]  CONSULT NOTE  REFERRING PHYSICIAN: Dr. Zola Herter  REASON FOR CONSULTATION:  68 years old white male recently diagnosed with lung cancer  HPI [name] is a 68 y.o. male with past medical history significant for coronary artery disease, asthma, osteoarthritis, congestive heart failure, COPD, hypertension, nonischemic cardiomyopathy as well as severe aortic stenosis status post TAVR.  The patient had CT of the coronary morphology performed on April 21, 2024 for evaluation of his aortic valve and incidentally showed a spiculated left apical pulmonary nodule measuring up to 2.6 cm suspicious for primary bronchogenic carcinoma.  The patient then had MRI of the brain on on April 22, 2024 that showed no evidence of metastatic disease to the brain.  A PET scan on May 14, 2024 showed the spiculated left upper lobe pulmonary nodule was hypermetabolic consistent with primary bronchogenic carcinoma.  There was no evidence of metastatic disease to the lymph node or distant metastasis.  There was probable small incidental hypermetabolic nodule in the deep lobe of the right parotid gland.  On June 03, 2024 the patient underwent redo bronchoscopy with endobronchial ultrasound and electromagnetic navigation procedure under the care of Dr. Herter.  The final pathology ([id number]) showed non-small cell lung cancer consistent with adenocarcinoma.  HPI  Discussed the use of AI scribe software for clinical note transcription with the patient, who gave verbal consent to proceed.  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  He was accompanied by his daughter [name].  A 2.6 cm nodule in the apical left upper lobe was  incidentally identified on CT during evaluation for heart failure. MRI of the brain and PET imaging revealed no evidence of metastatic disease. Bronchoscopy performed two weeks ago confirmed non-small cell lung cancer, adenocarcinoma subtype. He is currently asymptomatic from his lung cancer, denying chest pain, increased dyspnea, cough, hemoptysis, nausea, vomiting, diarrhea, or recent weight loss. He reports good appetite and has regained weight lost during prior cardiac illness. Pulmonary function testing is pending.  His cardiac history is significant for severe aortic stenosis requiring two aortic valve replacements, most recently in October 2025, as well as congestive heart failure and cardiomyopathy. Family history significant for mother and 3 sisters breast cancer.  Father died in accident.  He has a niece diagnosed with lung cancer. The patient is widowed and has 3 sons and 1 daughter.  He was accompanied by his daughter [name] today.  He used to work in [employer].  He has a history of smoking for around 55 years and quit recently.  He has no history of alcohol or drug abuse.     Past Medical History:  Diagnosis Date   Arthritis    Asthma    CAD (coronary artery disease) 01/23/2016   minor CAD at cath-40% RCA   CHF (congestive heart failure) (HCC)    COPD (chronic obstructive pulmonary disease) (HCC)    denies patient said that a pulmonologist said he said that he didnt have COPD   HFrEF (heart failure with reduced ejection fraction) (HCC)    Hypertension    Non-ischemic cardiomyopathy (HCC) 01/23/2016   EF 30-35%   Pneumonia    S/P VIV TAVR (transcatheter aortic valve replacement) 04/27/2024   s/p  VIV TAVR with a 26 mm Edwards Sapien 3 Ultra  Resilia THV via the TF approach by Dr. Wendel and Dr. Daniel   Severe aortic stenosis       Past Surgical History:  Procedure Laterality Date   ABDOMINAL AORTOGRAM W/LOWER EXTREMITY N/A 10/16/2016   Procedure: Abdominal Aortogram  w/Lower Extremity;  Surgeon: Deatrice DELENA Cage, MD;  Location: [facility] INVASIVE CV LAB;  Service: Cardiovascular;  Laterality: N/A;   AORTIC VALVE REPLACEMENT N/A 01/29/2016   Procedure: AORTIC VALVE REPLACEMENT (AVR) with Magna Ease size 25mm;  Surgeon: Maude Fleeta Ochoa, MD;  Location: [facility] OR;  Service: Open Heart Surgery;  Laterality: N/A;   CARDIAC CATHETERIZATION N/A 01/23/2016   Procedure: Right/Left Heart Cath and Coronary Angiography;  Surgeon: Vinie JAYSON Maxcy, MD;  Location: [facility] INVASIVE CV LAB;  Service: Cardiovascular;  Laterality: N/A;   CARDIOVERSION N/A 03/26/2016   Procedure: CARDIOVERSION;  Surgeon: Vinie JAYSON Maxcy, MD;  Location: [facility] ENDOSCOPY;  Service: Cardiovascular;  Laterality: N/A;   CATARACT EXTRACTION  12/2002 & 09/2007   ENDARTERECTOMY FEMORAL Left 11/01/2016   Procedure: ENDARTERECTOMY FEMORAL;  Surgeon: Serene Gaile ORN, MD;  Location: [facility] OR;  Service: Vascular;  Laterality: Left;   INTRAOPERATIVE TRANSTHORACIC ECHOCARDIOGRAM N/A 04/27/2024   Procedure: ECHOCARDIOGRAM, TRANSTHORACIC;  Surgeon: Wendel Lurena POUR, MD;  Location: [facility] CV LAB;  Service: Cardiovascular;  Laterality: N/A;   PATCH ANGIOPLASTY Left 11/01/2016   Procedure: PATCH ANGIOPLASTY;  Surgeon: Serene Gaile ORN, MD;  Location: [facility] OR;  Service: Vascular;  Laterality: Left;   RIGHT HEART CATH AND CORONARY ANGIOGRAPHY N/A 04/20/2024   Procedure: RIGHT HEART CATH AND CORONARY ANGIOGRAPHY;  Surgeon: Rolan Ezra RAMAN, MD;  Location: [facility] INVASIVE CV LAB;  Service: Cardiovascular;  Laterality: N/A;   SHOULDER SURGERY Right 07/01/1969   TEE WITHOUT CARDIOVERSION N/A 01/29/2016   Procedure: TRANSESOPHAGEAL ECHOCARDIOGRAM (TEE);  Surgeon: Maude Fleeta Ochoa, MD;  Location: [facility] OR;  Service: Open Heart Surgery;  Laterality: N/A;   VIDEO BRONCHOSCOPY WITH ENDOBRONCHIAL NAVIGATION Left 06/03/2024   Procedure: VIDEO BRONCHOSCOPY WITH ENDOBRONCHIAL NAVIGATION;  Surgeon: Zaida Zola SAILOR, MD;  Location: [facility] ENDOSCOPY;  Service:  Pulmonary;  Laterality: Left;    Family History  Problem Relation Age of Onset   Breast cancer Mother    Breast cancer Sister     Social History Social History[1]  Allergies[2]  Current Outpatient Medications  Medication Sig Dispense Refill   acetaminophen  (TYLENOL ) 500 MG tablet Take 1,000 mg by mouth daily as needed for headache.     albuterol  (VENTOLIN  HFA) 108 (90 Base) MCG/ACT inhaler Inhale 2 puffs into the lungs every 4 (four) hours as needed for wheezing or shortness of breath.     apixaban  (ELIQUIS ) 5 MG TABS tablet Take 1 tablet (5 mg total) by mouth 2 (two) times daily. 60 tablet 6   atorvastatin  (LIPITOR) 10 MG tablet Take 1 tablet (10 mg total) by mouth daily. 90 tablet 3   empagliflozin  (JARDIANCE ) 10 MG TABS tablet Take 1 tablet (10 mg total) by mouth daily before breakfast. 30 tablet 11   finasteride  (PROSCAR ) 5 MG tablet Take 1 tablet (5 mg total) by mouth daily. 30 tablet 1   losartan  (COZAAR ) 25 MG tablet Take 0.5 tablets (12.5 mg total) by mouth daily. 45 tablet 3   spironolactone  (ALDACTONE ) 25 MG tablet Take 1 tablet (25 mg total) by mouth daily. 90 tablet 2   tamsulosin  (FLOMAX ) 0.4 MG CAPS capsule Take 0.4 mg by mouth every evening.  1   torsemide  (DEMADEX ) 20 MG tablet Take 1 tablet (20 mg total)  by mouth daily. 90 tablet 2   No current facility-administered medications for this visit.    Review of Systems  Constitutional: positive for fatigue Eyes: negative Ears, nose, mouth, throat, and face: negative Respiratory: negative Cardiovascular: negative Gastrointestinal: negative Genitourinary:negative Integument/breast: positive for skin lesion(s) Hematologic/lymphatic: negative Musculoskeletal:negative Neurological: negative Behavioral/Psych: negative Endocrine: negative Allergic/Immunologic: negative  Physical Exam  MJO:jozmu, healthy, no distress, well nourished, and well developed SKIN: skin color, texture, turgor are normal, no rashes or  significant lesions HEAD: Normocephalic, No masses, lesions, tenderness or abnormalities EYES: normal, PERRLA, Conjunctiva are pink and non-injected EARS: External ears normal, Canals clear OROPHARYNX:no exudate, no erythema, and lips, buccal mucosa, and tongue normal  NECK: supple, no adenopathy, no JVD LYMPH:  no palpable lymphadenopathy, no hepatosplenomegaly LUNGS: clear to auscultation , and palpation HEART: regular rate & rhythm, no murmurs, and no gallops ABDOMEN:abdomen soft, non-tender, normal bowel sounds, and no masses or organomegaly BACK: Back symmetric, no curvature., No CVA tenderness EXTREMITIES:no joint deformities, effusion, or inflammation, no edema  NEURO: French & oriented x 3 with fluent speech, no focal motor/sensory deficits  PERFORMANCE STATUS: ECOG 1  LABORATORY DATA: Lab Results  Component Value Date   WBC 6.3 05/14/2024   HGB 14.2 05/14/2024   HCT 42.0 05/14/2024   MCV 98.4 05/14/2024   PLT 135 (L) 05/14/2024      Chemistry      Component Value Date/Time   NA 138 06/11/2024 1100   NA 137 11/19/2023 1006   K 4.2 06/11/2024 1100   CL 104 06/11/2024 1100   CO2 27 06/11/2024 1100   BUN 24 (H) 06/11/2024 1100   BUN 18 11/19/2023 1006   CREATININE 0.72 06/11/2024 1100   CREATININE 0.77 05/06/2024 1001   CREATININE 1.01 10/08/2016 1157      Component Value Date/Time   CALCIUM  9.0 06/11/2024 1100   ALKPHOS 52 05/06/2024 1001   AST 25 05/06/2024 1001   ALT 22 05/06/2024 1001   BILITOT 0.5 05/06/2024 1001       RADIOGRAPHIC STUDIES: DG Chest Port 1 View Result Date: 06/03/2024 EXAM: 1 VIEW(S) XRAY OF THE CHEST 06/03/2024 11:54:00 AM COMPARISON: 04/26/2024 CLINICAL HISTORY: S/P bronchoscopy FINDINGS: LUNGS AND PLEURA: Ill-definition of the known left apical pulmonary nodule. Emphysema. No visible pneumothorax. No pleural effusion. HEART AND MEDIASTINUM: TAVR in place. No acute abnormality of the cardiac and mediastinal silhouettes. BONES AND SOFT  TISSUES: Prior median sternotomy. Spurring and irregularity along the right coracoclavicular ligament attachments. IMPRESSION: 1. Ill-definition of the known left apical pulmonary nodule. 2. Emphysema. 3. Several chronic and incidental findings are present, including prior TAVR and median sternotomy, and spurring with irregularity along the right coracoclavicular ligament attachments. Electronically signed by: Ryan Salvage MD 06/03/2024 12:30 PM EST RP Workstation: [id number]   DG C-ARM BRONCHOSCOPY Result Date: 06/03/2024 C-ARM BRONCHOSCOPY: Fluoroscopy was utilized by the requesting physician.  No radiographic interpretation.   DG C-Arm 1-60 Min-No Report Result Date: 06/03/2024 Fluoroscopy was utilized by the requesting physician.  No radiographic interpretation.   DG C-Arm 1-60 Min-No Report Result Date: 06/03/2024 Fluoroscopy was utilized by the requesting physician.  No radiographic interpretation.   ECHOCARDIOGRAM COMPLETE Result Date: 05/19/2024    ECHOCARDIOGRAM REPORT   Patient Name:   [name] Date of Exam: 05/19/2024 Medical Rec #:  [id number]       Height:       70.0 in Accession #:    [id number]      Weight:  157.0 lb Date of Birth:  Jan 11, 1956       BSA:          1.883 m Patient Age:    68 years        BP:           116/68 mmHg Patient Gender: M               HR:           59 bpm. Exam Location:  [address] Procedure: 2D Echo, Color Doppler, Cardiac Doppler and 3D Echo (Both Spectral            and Color Flow Doppler were utilized during procedure). Indications:    LV Dysfunction I50.22  History:        Patient has prior history of Echocardiogram examinations, most                 recent 04/28/2024. CHF, CAD, COPD; Risk Factors:Hypertension.                 Aortic Valve: 26 mm Edwards Sapien prosthetic, stented (TAVR)                 valve is present in the aortic position. Procedure Date:                 04/27/2024.  Sonographer:    Augustin Seals RDCS Referring  Phys: [phone] KENNETH C HILTY IMPRESSIONS  1. Left ventricular ejection fraction, by estimation, is 35 to 40%. Left ventricular ejection fraction by 3D volume is 35 %. The left ventricle has moderately decreased function. The left ventricle demonstrates global hypokinesis. The left ventricular internal cavity size was moderately dilated. Left ventricular diastolic parameters are consistent with Grade III diastolic dysfunction (restrictive). Elevated left atrial pressure.  2. Right ventricular systolic function is normal. The right ventricular size is mildly enlarged.  3. Left atrial size was severely dilated.  4. Right atrial size was mildly dilated.  5. The mitral valve is normal in structure. Mild mitral valve regurgitation. No evidence of mitral stenosis.  6. The aortic valve has been repaired/replaced. Aortic valve regurgitation is not visualized. No aortic stenosis is present. There is a 26 mm Edwards Sapien prosthetic (TAVR) valve present in the aortic position. Procedure Date: 04/27/2024. Echo findings are consistent with normal structure and function of the aortic valve prosthesis. Aortic valve area, by VTI measures 2.27 cm. Aortic valve mean gradient measures 20.0 mmHg. Aortic valve Vmax measures 3.06 m/s.  7. The inferior vena cava is normal in size with greater than 50% respiratory variability, suggesting right atrial pressure of 3 mmHg. FINDINGS  Left Ventricle: Left ventricular ejection fraction, by estimation, is 35 to 40%. Left ventricular ejection fraction by 3D volume is 35 %. The left ventricle has moderately decreased function. The left ventricle demonstrates global hypokinesis. The left ventricular internal cavity size was moderately dilated. There is no left ventricular hypertrophy. Left ventricular diastolic parameters are consistent with Grade III diastolic dysfunction (restrictive). Elevated left atrial pressure. Right Ventricle: The right ventricular size is mildly enlarged. No increase in  right ventricular wall thickness. Right ventricular systolic function is normal. Left Atrium: Left atrial size was severely dilated. Right Atrium: Right atrial size was mildly dilated. Pericardium: There is no evidence of pericardial effusion. Mitral Valve: The mitral valve is normal in structure. Mild mitral valve regurgitation. No evidence of mitral valve stenosis. Tricuspid Valve: The tricuspid valve is normal in structure. Tricuspid valve regurgitation is not  demonstrated. No evidence of tricuspid stenosis. Aortic Valve: Mildly elevated gradients. Normal EOA index and DVI 0.5 suggestive of high flow state. The aortic valve has been repaired/replaced. Aortic valve regurgitation is not visualized. No aortic stenosis is present. Aortic valve mean gradient measures 20.0 mmHg. Aortic valve peak gradient measures 37.5 mmHg. Aortic valve area, by VTI measures 2.27 cm. There is a 26 mm Edwards Sapien prosthetic, stented (TAVR) valve present in the aortic position. Procedure Date: 04/27/2024. Echo findings are  consistent with normal structure and function of the aortic valve prosthesis. Pulmonic Valve: The pulmonic valve was normal in structure. Pulmonic valve regurgitation is not visualized. No evidence of pulmonic stenosis. Aorta: The aortic root is normal in size and structure. Venous: The inferior vena cava is normal in size with greater than 50% respiratory variability, suggesting right atrial pressure of 3 mmHg. IAS/Shunts: No atrial level shunt detected by color flow Doppler. Additional Comments: 3D was performed not requiring image post processing on an independent workstation and was abnormal.  LEFT VENTRICLE PLAX 2D LVIDd:         6.60 cm         Diastology LVIDs:         5.10 cm         LV e' medial:    4.70 cm/s LV PW:         1.20 cm         LV E/e' medial:  24.9 LV IVS:        1.10 cm         LV e' lateral:   9.08 cm/s LVOT diam:     2.40 cm         LV E/e' lateral: 12.9 LV SV:         150 LV SV Index:    80 LVOT Area:     4.52 cm        3D Volume EF                                LV 3D EF:    Left                                             ventricul                                             ar                                             ejection                                             fraction                                             by 3D  volume is                                             35 %.                                 3D Volume EF:                                3D EF:        35 %                                LV EDV:       271 ml                                LV ESV:       176 ml                                LV SV:        95 ml RIGHT VENTRICLE             IVC RV Basal diam:  4.40 cm     IVC diam: 1.60 cm RV Mid diam:    3.40 cm RV S prime:     11.30 cm/s  PULMONARY VEINS TAPSE (M-mode): 1.8 cm      A Reversal Velocity: 33.40 cm/s                             Diastolic Velocity:  42.20 cm/s                             S/D Velocity:        1.20                             Systolic Velocity:   50.50 cm/s LEFT ATRIUM              Index        RIGHT ATRIUM           Index LA diam:        4.30 cm  2.28 cm/m   RA Area:     21.80 cm LA Vol (A2C):   81.7 ml  43.38 ml/m  RA Volume:   64.40 ml  34.20 ml/m LA Vol (A4C):   104.0 ml 55.22 ml/m LA Biplane Vol: 92.1 ml  48.90 ml/m  AORTIC VALVE AV Area (Vmax):    2.25 cm AV Area (Vmean):   2.23 cm AV Area (VTI):     2.27 cm AV Vmax:           306.00 cm/s AV Vmean:          207.000 cm/s AV VTI:            0.662 m AV Peak Grad:      37.5 mmHg AV Mean Grad:  20.0 mmHg LVOT Vmax:         152.00 cm/s LVOT Vmean:        102.000 cm/s LVOT VTI:          0.332 m LVOT/AV VTI ratio: 0.50  AORTA Ao Root diam: 3.30 cm Ao Asc diam:  3.60 cm MITRAL VALVE MV Area (PHT): 3.12 cm       SHUNTS MV Decel Time: 243 msec       Systemic VTI:  0.33 m MR Peak grad:    98.0 mmHg    Systemic Diam: 2.40 cm MR Mean grad:     68.0 mmHg MR Vmax:         495.00 cm/s MR Vmean:        394.0 cm/s MR PISA:         1.57 cm MR PISA Eff ROA: 10 mm MR PISA Radius:  0.50 cm MV E velocity: 117.00 cm/s MV A velocity: 42.80 cm/s MV E/A ratio:  2.73 Morene Brownie Electronically signed by Morene Brownie Signature Date/Time: 05/19/2024/3:03:14 PM    Final     ASSESSMENT: This is a very pleasant 68 years old white male recently diagnosed with a stage Ia (cT1c N0, M0) non-small cell lung cancer, adenocarcinoma presented with apical left upper lobe lung nodule diagnosed in November 2025.  The patient has multiple comorbidities and he would not be a good surgical candidate.   PLAN: I had a lengthy discussion with the patient and his daughter today about his current disease stage, prognosis and treatment options.  I personally and independently reviewed the imaging studies as well as the pathology report and discussed the results with the patient and his daughter. Assessment and Plan Assessment & Plan Stage IA adenocarcinoma of the left upper lobe of the lung Recently diagnosed, incidentally discovered, early-stage non-small cell lung cancer localized to the left upper lobe, measuring 2.6 cm, without evidence of metastatic disease on imaging. He remains asymptomatic, with preserved appetite and no weight loss or respiratory complaints. Significant cardiac comorbidities and recent aortic valve replacement confer high perioperative risk, precluding surgical resection. SBRT is recommended as definitive therapy given his comorbidities and early-stage disease. Chemotherapy, targeted therapy, and immunotherapy are not indicated. SBRT is anticipated to be well tolerated, with minimal acute side effects; he should be able to drive himself to treatments, and no sedation is required. Expected outcome is local tumor control, with possible residual scarring at the treatment site. - Discussed diagnosis and rationale for non-surgical management due to  cardiac risk. - Referred to radiation oncology for SBRT as definitive treatment. - Confirmed upcoming appointment with local radiation oncologist for evaluation and treatment planning. - Planned follow-up in four months with repeat imaging to assess response to radiation therapy. - Planned ongoing surveillance every six months after initial post-treatment follow-up.  Congestive heart failure with cardiomyopathy and prosthetic aortic valve Severe aortic stenosis status post two aortic valve replacements, most recently in October. Ongoing management for congestive heart failure and cardiomyopathy. Cardiac comorbidities preclude surgical management of lung cancer. - Reviewed cardiac history and confirmed recent aortic valve replacement and ongoing heart failure management.  Hypertension History of hypertension.  History of skin cancer History of skin cancer with current skin lesions identified on examination. He has not had recent dermatologic evaluation. - Referred to local dermatologist for evaluation of current skin lesions. He was advised to call immediately if he has any other concerning symptoms in the interval.   The patient voices understanding of current  disease status and treatment options and is in agreement with the current care plan.  All questions were answered. The patient knows to call the clinic with any problems, questions or concerns. We can certainly see the patient much sooner if necessary.  Thank you so much for allowing me to participate in the care of [name]. I will continue to follow up the patient with you and assist in his care.  The total time spent in the appointment was 60 minutes including review of chart and various tests results, discussions about plan of care and coordination of care plan .  Disclaimer: This note was dictated with voice recognition software. Similar sounding words can inadvertently be transcribed and may not be corrected upon  review.   [name] Benjamin June 16, 2024, 1:29 PM        [1]  Social History Tobacco Use   Smoking status: Former    Current packs/day: 0.00    Average packs/day: 1 pack/day for 48.0 years (48.0 ttl pk-yrs)    Types: Cigarettes    Start date: 01/23/1971    Quit date: 2025    Years since quitting: 0.9   Smokeless tobacco: Former   Tobacco comments:    Quit 04/19/24   Vaping Use   Vaping status: Former   Quit date: 08/28/2015  Substance Use Topics   Alcohol use: Not Currently    Alcohol/week: 4.0 standard drinks of alcohol    Types: 4 Standard drinks or equivalent per week    Comment: has not drank alcohol in 3 months (05/31/24)   Drug use: No  [2] No Known Allergies

## 2024-06-17 ENCOUNTER — Ambulatory Visit
Admission: RE | Admit: 2024-06-17 | Discharge: 2024-06-17 | Attending: Radiation Oncology | Admitting: Radiation Oncology

## 2024-06-17 VITALS — BP 118/61 | HR 64 | Temp 98.3°F | Resp 18 | Ht 69.0 in | Wt 172.1 lb

## 2024-06-17 DIAGNOSIS — C3412 Malignant neoplasm of upper lobe, left bronchus or lung: Secondary | ICD-10-CM

## 2024-06-17 NOTE — Progress Notes (Signed)
 This NN met with the pt today at his/her consult appt with Dr. Sherrod. Pt was accompanied by his dtr, Creighton.  NN explained the role of navigation in the cancer care continuum.  The plan for the pt is radiation to [facility]. Pt is established with Dr Jomarie in [facility].  Following the completion of his radiation treatment, per Dr Blas note is for the pt to f/u in 4 mos with CT with f/u appt 1 week later.  NN provided pt with direct contact information and encouraged pt call with any questions or concerns.  NN escorted pt to scheduling to arrange for follow up appt.

## 2024-06-18 NOTE — Progress Notes (Signed)
 error

## 2024-06-18 NOTE — Progress Notes (Signed)
 " Radiation Oncology         [phone] ________________________________  Name: [name]        MRN: [id number]  Date of Service: 06/17/2024 DOB: 11/27/1955  RR:Dojundxb, Norleen PARAS., MD  Gatha Gatha, MD    REFERRING PHYSICIAN: Ruthell Lauraine FALCON, NP   DIAGNOSIS: Non-small cell lung carcinoma, left upper lobe, cT1c N0 M0    HISTORY OF PRESENT ILLNESS: [name] is a 68 y.o. male seen at the request of Lauraine Ruthell, MD. While undergoing workup for cardiac issues earlier this year, he was found to have an abnormality in his left upper lobe on CT imaging.  Chest CT imaging in late October revealed a spiculated nodule in the left lung apex, measuring 2.6 x 1.7 cm.  On 05/14/2024, he underwent PET/CT imaging; it revealed increased metabolic activity involving his left upper lobe nodule.  No evidence of lymphadenopathy, or metastatic disease, was seen.  Additionally, he has undergone MRI imaging of the brain, revealing no acute intracranial abnormality.  Bronchoscopy with fine-needle aspiration of his left upper lung nodule revealed non-small cell carcinoma.  He has been seen in consultation by Dr. Sherrod, and systemic treatment was not recommended.  Consultation is requested regarding the potential role of radiation in his care.      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 Wendel and Dr. Daniel   Severe aortic stenosis        PAST SURGICAL HISTORY: Past Surgical History:  Procedure Laterality Date   ABDOMINAL AORTOGRAM W/LOWER EXTREMITY N/A 10/16/2016   Procedure: Abdominal Aortogram w/Lower Extremity;  Surgeon: Deatrice DELENA Cage, MD;  Location: [facility] INVASIVE CV LAB;  Service: Cardiovascular;  Laterality: N/A;   AORTIC VALVE REPLACEMENT N/A 01/29/2016   Procedure: AORTIC VALVE REPLACEMENT (AVR) with Magna Ease size 25mm;  Surgeon: Maude Fleeta Ochoa, MD;  Location: [facility] OR;  Service: Open Heart Surgery;  Laterality: N/A;   CARDIAC CATHETERIZATION N/A 01/23/2016   Procedure: Right/Left Heart Cath and Coronary Angiography;  Surgeon: Vinie JAYSON Maxcy, MD;  Location: [facility] INVASIVE CV LAB;  Service: Cardiovascular;  Laterality: N/A;   CARDIOVERSION N/A 03/26/2016   Procedure: CARDIOVERSION;  Surgeon: Vinie JAYSON Maxcy, MD;  Location: [facility] ENDOSCOPY;  Service: Cardiovascular;  Laterality: N/A;   CATARACT EXTRACTION  12/2002 & 09/2007   ENDARTERECTOMY FEMORAL Left 11/01/2016   Procedure: ENDARTERECTOMY FEMORAL;  Surgeon: Serene Gaile ORN, MD;  Location: [facility] OR;  Service: Vascular;  Laterality: Left;   INTRAOPERATIVE TRANSTHORACIC ECHOCARDIOGRAM N/A 04/27/2024   Procedure: ECHOCARDIOGRAM, TRANSTHORACIC;  Surgeon: Wendel Lurena POUR, MD;  Location: [facility] INVASIVE CV LAB;  Service: Cardiovascular;  Laterality: N/A;   PATCH ANGIOPLASTY Left 11/01/2016   Procedure: PATCH ANGIOPLASTY;  Surgeon: Serene Gaile ORN, MD;  Location: [facility] OR;  Service: Vascular;  Laterality: Left;   RIGHT HEART CATH AND CORONARY ANGIOGRAPHY N/A 04/20/2024   Procedure: RIGHT HEART CATH AND CORONARY ANGIOGRAPHY;  Surgeon: Rolan Ezra RAMAN, MD;  Location: [facility] INVASIVE CV LAB;  Service: Cardiovascular;  Laterality: N/A;   SHOULDER SURGERY Right 07/01/1969   TEE WITHOUT CARDIOVERSION N/A 01/29/2016   Procedure: TRANSESOPHAGEAL ECHOCARDIOGRAM (TEE);  Surgeon: Maude Fleeta Ochoa, MD;  Location: [facility] OR;   Service: Open Heart Surgery;  Laterality: N/A;   VIDEO BRONCHOSCOPY WITH ENDOBRONCHIAL NAVIGATION Left 06/03/2024   Procedure: VIDEO BRONCHOSCOPY WITH ENDOBRONCHIAL NAVIGATION;  Surgeon: Zaida Zola SAILOR, MD;  Location: [facility] ENDOSCOPY;  Service: Pulmonary;  Laterality: Left;     FAMILY HISTORY:  Family History  Problem Relation Age of Onset   Breast cancer Mother    Breast cancer Sister      SOCIAL HISTORY:  reports that he quit smoking about a year ago. His smoking use included cigarettes. He started smoking about 53 years ago. He has a 48 pack-year smoking history. He has quit using smokeless tobacco. He reports that he does not currently use alcohol after a past usage of about 4.0 standard drinks of alcohol per week. He reports that he does not use drugs.  He lives by himself in [place]. He.  Left upper lobe   ALLERGIES: Patient has no known allergies.   MEDICATIONS:  Current Outpatient Medications  Medication Sig Dispense Refill   acetaminophen  (TYLENOL ) 500 MG tablet Take 1,000 mg by mouth daily as needed for headache.     albuterol  (VENTOLIN  HFA) 108 (90 Base) MCG/ACT inhaler Inhale 2 puffs into the lungs every 4 (four) hours as needed for wheezing or shortness of breath.     apixaban  (ELIQUIS ) 5 MG TABS tablet Take 1 tablet (5 mg total) by mouth 2 (two) times daily. 60 tablet 6   atorvastatin  (LIPITOR) 10 MG tablet Take 1 tablet (10 mg total) by mouth daily. 90 tablet 3   empagliflozin  (JARDIANCE ) 10 MG TABS tablet Take 1 tablet (10 mg total) by mouth daily before breakfast. 30 tablet 11   finasteride  (PROSCAR ) 5 MG tablet Take 1 tablet (5 mg total) by mouth daily. 30 tablet 1   losartan  (COZAAR ) 25 MG tablet Take 0.5 tablets (12.5 mg total) by mouth daily. 45 tablet 3   spironolactone  (ALDACTONE ) 25 MG tablet Take 1 tablet (25 mg total) by mouth daily. 90 tablet 2   tamsulosin  (FLOMAX ) 0.4 MG CAPS capsule Take 0.4 mg by mouth every evening.  1   torsemide  (DEMADEX ) 20 MG tablet  Take 1 tablet (20 mg total) by mouth daily. 90 tablet 2   No current facility-administered medications for this encounter.     REVIEW OF SYSTEMS: On review of systems, the patient reports that he is doing well overall.  He denies any chest pain, shortness of breath, cough, fevers, chills, night sweats, unintended weight changes.  He denies any bowel or bladder disturbances, and denies abdominal pain, nausea or vomiting.  He denies any new musculoskeletal or joint aches or pains. A complete review of systems is obtained and is otherwise negative.     PHYSICAL EXAM:  Wt Readings from Last 3 Encounters:  06/17/24 172 lb 1.6 oz (78.1 kg)  06/16/24 166 lb (75.3 kg)  06/11/24 169 lb 3.2 oz (76.7 kg)   Temp Readings from Last 3 Encounters:  06/17/24 98.3 F (36.8 C) (Oral)  06/16/24 97.8 F (36.6 C) (Temporal)  06/09/24 98.6 F (37 C) (Oral)   BP  Readings from Last 3 Encounters:  06/17/24 118/61  06/11/24 112/80  06/09/24 113/73   Pulse Readings from Last 3 Encounters:  06/17/24 64  06/16/24 73  06/11/24 66   Pain Assessment Pain Score: 0-No pain/10  In general this is a well appearing male in no acute distress.  He's alert and oriented x4 and appropriate throughout the examination. Cardiopulmonary assessment is negative for acute distress and he exhibits normal effort.  No cervical or supraclavicular lymphadenopathy is appreciated.  Extremities are without edema.  His abdomen is nontender and nondistended.    ECOG = 0  0 - Asymptomatic (Fully active, able to carry on all predisease activities without restriction)  1 - Symptomatic but completely ambulatory (Restricted in physically strenuous activity but ambulatory and able to carry out work of a light or sedentary nature. For example, light housework, office work)  2 - Symptomatic, <50% in bed during the day (Ambulatory and capable of all self care but unable to carry out any work activities. Up and about more than 50% of  waking hours)  3 - Symptomatic, >50% in bed, but not bedbound (Capable of only limited self-care, confined to bed or chair 50% or more of waking hours)  4 - Bedbound (Completely disabled. Cannot carry on any self-care. Totally confined to bed or chair)  5 - Death   Raylene MM, Creech RH, Tormey DC, et al. [phone]). Toxicity and response criteria of the [facility]. Am. DOROTHA Bridges. Oncol. 5 (6): 649-55    LABORATORY DATA:  Lab Results  Component Value Date   WBC 7.3 06/16/2024   HGB 14.3 06/16/2024   HCT 42.0 06/16/2024   MCV 94.0 06/16/2024   PLT 105 (L) 06/16/2024   Lab Results  Component Value Date   NA 139 06/16/2024   K 4.1 06/16/2024   CL 100 06/16/2024   CO2 28 06/16/2024   Lab Results  Component Value Date   ALT 34 06/16/2024   AST 29 06/16/2024   ALKPHOS 66 06/16/2024   BILITOT 0.3 06/16/2024      RADIOGRAPHY: DG Chest Port 1 View Result Date: 06/03/2024 EXAM: 1 VIEW(S) XRAY OF THE CHEST 06/03/2024 11:54:00 AM COMPARISON: 04/26/2024 CLINICAL HISTORY: S/P bronchoscopy FINDINGS: LUNGS AND PLEURA: Ill-definition of the known left apical pulmonary nodule. Emphysema. No visible pneumothorax. No pleural effusion. HEART AND MEDIASTINUM: TAVR in place. No acute abnormality of the cardiac and mediastinal silhouettes. BONES AND SOFT TISSUES: Prior median sternotomy. Spurring and irregularity along the right coracoclavicular ligament attachments. IMPRESSION: 1. Ill-definition of the known left apical pulmonary nodule. 2. Emphysema. 3. Several chronic and incidental findings are present, including prior TAVR and median sternotomy, and spurring with irregularity along the right coracoclavicular ligament attachments. Electronically signed by: Ryan Salvage MD 06/03/2024 12:30 PM EST RP Workstation: [id number]   DG C-ARM BRONCHOSCOPY Result Date: 06/03/2024 C-ARM BRONCHOSCOPY: Fluoroscopy was utilized by the requesting physician.  No radiographic interpretation.    DG C-Arm 1-60 Min-No Report Result Date: 06/03/2024 Fluoroscopy was utilized by the requesting physician.  No radiographic interpretation.   DG C-Arm 1-60 Min-No Report Result Date: 06/03/2024 Fluoroscopy was utilized by the requesting physician.  No radiographic interpretation.       IMPRESSION/PLAN: 1.  The patient is a 68 year old male with non-small cell lung carcinoma of the left upper lobe, T1c N0 M0.  His tumor mass measures 2.6 x 1.7 cm.  PET/CT imaging revealed no evidence of lymphadenopathy, or metastatic disease.  I reviewed his pathologic and radiologic  findings with him thus far.  Given the small size of his malignancy, and lack of associated lymphadenopathy, I think he is an excellent candidate for stereotactic body radiotherapy (SBRT.) I explained the logistics of SBRT to him, as well as its rationale and potential side effects.  He expressed understanding, and is agreeable to proceed.  Of note, he wishes to defer treatment until mid January of next year.  Will make arrangements for him to return to our department in mid January for simulation and treatment planning.  I encouraged him to contact me in the interim with any questions or concerns he may have.   In a visit lasting 60 minutes, greater than 50% of the time was spent face to face discussing the patient's condition, in preparation for the discussion, and coordinating the patient's care.    JOMARIE LYNWOOD LABOR., MD    **Disclaimer: This note was dictated with voice recognition software. Similar sounding words can inadvertently be transcribed and this note may contain transcription errors which may not have been corrected upon publication of note.**  "

## 2024-07-02 NOTE — Progress Notes (Signed)
 "  ADVANCED HF CLINIC NOTE  Primary Care: Sabas Norleen PARAS., MD Primary Cardiologist: Vinie JAYSON Maxcy, MD HF Cardiologist: Dr. Cherrie  HPI: [name] is a 69 y.o. male with a hx of nonischemic cardiomyopathy with mildly reduced EF, nonobstructive CAD by heart cath, severe aortic stenosis with bicuspid valve status bioprosthetic AVR 12/2015, atrial fibrillation status post DCCV 03/2016, PAD status post left, femoral, superficial femoral, profunda femoral and external iliac endarterectomy and bovine pericardial patch angioplasty 10/2016, hypertension, tobacco abuse, COPD, OSA.  Admitted 11/25 with hypotension and elevated troponins. Echo showed EF 20-25% (down from 45-50% in 4/25. Severe prosthetic AS mean gradient . Concern for CGS, moved to ICU. Low dose DBA added without benefit, NE started. Underwent R/LHC showing low RA but elevated PWCP; LHC showed 95% stenosis large RPLV branch. CTS and Structural Heart consulted for evaluation for redo valve replacement. He was felt to be at high risk for conventional surgical aortic valve replacement. Pre-TAVR scans showed incidental finding of spiculated left apical pulmonary nodule concerning for bronchogenic carcinoma as well as prostatomegaly. Case was d/w oncology, who recommended brain MRI to r/o brain mets. This showed no evidence of metastatic disease. He underwent VIV TAVR 04/27/24. Post-op echo EF 30%, G2DD, mod MR (improved), mild/mod AI, AoV mean gradient 22 mmHg. Drips weaned and GDMT titrated, he was discharged home, weight 159 lbs.  Today he returns for HF follow up. Overall feeling fine. No SOB doing yardwork. Main issue is leg pain, calves hurt when he walks.  Can walk through the pain, but bothersome. No pedal ulcers. Denies increasing SOB, palpitations, abnormal bleeding, CP, dizziness, edema, or PND/Orthopnea. Appetite ok. Weight at home 169 pounds. Taking all medications. Remains quit from tobacco since 03/2024.  Cardiac  Studies  - Echo (04/28/24) post op: EF 30%, G2DD, RV ok, moderate MR,  AVA 1.04 cm , AoV mean gradient 41.2 mmHg  - R/LHC (10/25) on DBA 2.5 + NE 10: 95% stenosis large RPLV branch; RA 4, PA 49/22 (32), PCWP 23, CO/CI (Fick) 4.42/2.64, PVR 2 WU, PAPi 6.75  - Echo 04/19/24: EF 20-25%, RV ok, moderate to severe MR, severe prosthetic AS with AVA 0.61 cm , AoV mean gradient 49 mmHg  Past Medical History:  Diagnosis Date   Arthritis    Asthma    CAD (coronary artery disease) 01/23/2016   minor CAD at cath-40% RCA   CHF (congestive heart failure) (HCC)    COPD (chronic obstructive pulmonary disease) (HCC)    denies patient said that a pulmonologist said he said that he didnt have COPD   HFrEF (heart failure with reduced ejection fraction) (HCC)    Hypertension    Non-ischemic cardiomyopathy (HCC) 01/23/2016   EF 30-35%   Pneumonia    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 Wendel and Dr. Daniel   Severe aortic stenosis    Current Outpatient Medications  Medication Sig Dispense Refill   acetaminophen  (TYLENOL ) 500 MG tablet Take 1,000 mg by mouth daily as needed for headache.     albuterol  (VENTOLIN  HFA) 108 (90 Base) MCG/ACT inhaler Inhale 2 puffs into the lungs every 4 (four) hours as needed for wheezing or shortness of breath.     apixaban  (ELIQUIS ) 5 MG TABS tablet Take 1 tablet (5 mg total) by mouth 2 (two) times daily. 60 tablet 6   atorvastatin  (LIPITOR) 10  MG tablet Take 1 tablet (10 mg total) by mouth daily. 90 tablet 3   empagliflozin  (JARDIANCE ) 10 MG TABS tablet Take 1 tablet (10 mg total) by mouth daily before breakfast. 30 tablet 11   finasteride  (PROSCAR ) 5 MG tablet Take 1 tablet (5 mg total) by mouth daily. 30 tablet 1   losartan  (COZAAR ) 25 MG tablet Take 0.5 tablets (12.5 mg total) by mouth daily. 45 tablet 3   spironolactone  (ALDACTONE ) 25 MG tablet Take  1 tablet (25 mg total) by mouth daily. 90 tablet 2   tamsulosin  (FLOMAX ) 0.4 MG CAPS capsule Take 0.4 mg by mouth every evening.  1   torsemide  (DEMADEX ) 20 MG tablet Take 1 tablet (20 mg total) by mouth daily. 90 tablet 2   No current facility-administered medications for this encounter.   No Known Allergies  Social History   Socioeconomic History   Marital status: Widowed    Spouse name: Not on file   Number of children: 4   Years of education: 10   Highest education level: Not on file  Occupational History   Occupation: holiday representative    Comment: Engineer, Mining  Tobacco Use   Smoking status: Former    Current packs/day: 0.00    Average packs/day: 1 pack/day for 48.0 years (48.0 ttl pk-yrs)    Types: Cigarettes    Start date: 01/23/1971    Quit date: 2025    Years since quitting: 1.0   Smokeless tobacco: Former   Tobacco comments:    Quit 04/19/24   Vaping Use   Vaping status: Former   Quit date: 08/28/2015  Substance and Sexual Activity   Alcohol use: Not Currently    Alcohol/week: 4.0 standard drinks of alcohol    Types: 4 Standard drinks or equivalent per week    Comment: has not drank alcohol in 3 months (05/31/24)   Drug use: No   Sexual activity: Not Currently  Other Topics Concern   Not on file  Social History Narrative   Not on file   Social Drivers of Health   Tobacco Use: Medium Risk (06/11/2024)   Patient History    Smoking Tobacco Use: Former    Smokeless Tobacco Use: Former    Passive Exposure: Not on Actuary Strain: Not on file  Food Insecurity: No Food Insecurity (05/06/2024)   Epic    Worried About Programme Researcher, Broadcasting/film/video in the Last Year: Never true    Ran Out of Food in the Last Year: Never true  Transportation Needs: No Transportation Needs (05/06/2024)   Epic    Lack of Transportation (Medical): No    Lack of Transportation (Non-Medical): No  Physical Activity: Not on file  Stress: Not on file  Social Connections:  Socially Isolated (04/19/2024)   Social Connection and Isolation Panel    Frequency of Communication with Friends and Family: More than three times a week    Frequency of Social Gatherings with Friends and Family: Twice a week    Attends Religious Services: Never    Database Administrator or Organizations: No    Attends Banker Meetings: Never    Marital Status: Widowed  Intimate Partner Violence: Not At Risk (05/06/2024)   Epic    Fear of Current or Ex-Partner: No    Emotionally Abused: No    Physically Abused: No    Sexually Abused: No  Depression (PHQ2-9): Low Risk (06/17/2024)   Depression (PHQ2-9)    PHQ-2 Score: 0  Alcohol Screen: Not on file  Housing: Low Risk (05/06/2024)   Epic    Unable to Pay for Housing in the Last Year: No    Number of Times Moved in the Last Year: 0    Homeless in the Last Year: No  Utilities: Not At Risk (05/06/2024)   Epic    Threatened with loss of utilities: No  Health Literacy: Not on file   Family History  Problem Relation Age of Onset   Breast cancer Mother    Breast cancer Sister    Wt Readings from Last 3 Encounters:  07/05/24 78.6 kg (173 lb 3.2 oz)  06/17/24 78.1 kg (172 lb 1.6 oz)  06/16/24 75.3 kg (166 lb)   BP 116/64   Pulse 73   Wt 78.6 kg (173 lb 3.2 oz)   SpO2 96%   BMI 25.58 kg/m   PHYSICAL EXAM: General:  NAD. No resp difficulty, walked into clinic HEENT: Normal Neck: Supple. No JVD. Cor: Regular rate & rhythm. No rubs, gallops or murmurs. Lungs: Clear, diminished in bases Abdomen: Soft, nontender, nondistended.  Extremities: No cyanosis, clubbing, rash, edema Neuro: Alert & oriented x 3, moves all 4 extremities w/o difficulty. Affect pleasant.  ASSESSMENT & PLAN: 1. Chronic Systolic CHF  - Echo (10/25): EF 20-25%, moderate LV dilation,  RV normal, moderate-severe MR, bioprosthetic aortic valve with severe stenosis and mean gradient 49 with mild-moderate AI, IVC not dilated.  -  Prior echo had shown  EF 45-50%.  In the past, EF dropped with severe AS.  Suspect this is the cause again.   - LHC in 2017 showed nonobstructive CAD,  - R/LHC this admission: 95% stenosis large RPLV branch.  On RHC, RA pressure was low but PCWP elevated.  - Intra-op echo with EF 25%, mild/mid MR - Echo 04/28/24: EF 30% w mod MR - NYHA I-II, volume OK - Start bisoprolol  2.5 mg at bedtime  - Continue losartan  12.5 mg daily. - Continue torsemide  20 mg daily. - Continue spiro 25 mg daily - Continue Jardiance  10 mg daily - Repeat echo in 2 months - We discussed CR however he declines. - Recent labs 06/16/24 reviewed and are stable, K 4.1, SCr 0.85   2. Aortic stenosis  - Severe bioprosthetic AS with mild-moderate AI. He initially had AVR for bicuspid aortic valve. S - s/p VIV TAVR 04/27/24 - intra-op mean gradient 8 mmHg; formal post-op 22 mmHg with mod AI - Continue Eliquis  5 mg bid. No bleeding issues - Follows with Structural Heart Team   3. Non small cell Lung CA  - incidental findings on TAVR protocol CTA - recent bronch showed + non small cell CA from bx in left lung nodule - PET negative for mets - Saw Rad Onc, planning SBRT this month   4. CAD - LHC (10/25): 95% stenosis in large RPLV branch.  For the time being, will manage medically.  - No chest pain - Continue Eliquis , no ASA - Continue atorvastatin  10 mg daily   5. Atrial fibrillation  - Paroxysmal.  - Regular on exam - Continue Eliquis  5 mg bid. No bleeding issues   6. PAD  - History of peripheral intervention in 2018.  - Worsening claudication pain, no pedal ulcers - Will get ABIs and LE arterial duplex dopplers - Previously followed by Dr. Darron, refer back   7. Emphysema/smoking:  - Quit smoking 03/2024. - Congratulated.  Follow up in 2 months with Dr. Cherrie + echo.  [place],  FNP-BC 07/05/2024 "

## 2024-07-05 ENCOUNTER — Encounter (HOSPITAL_COMMUNITY): Payer: Self-pay

## 2024-07-05 ENCOUNTER — Ambulatory Visit (HOSPITAL_COMMUNITY)
Admission: RE | Admit: 2024-07-05 | Discharge: 2024-07-05 | Disposition: A | Discharge status: Home / Self Care | Source: Ambulatory Visit | Attending: Family Medicine | Admitting: Family Medicine

## 2024-07-05 VITALS — BP 116/64 | HR 73 | Wt 173.2 lb

## 2024-07-05 DIAGNOSIS — C3490 Malignant neoplasm of unspecified part of unspecified bronchus or lung: Secondary | ICD-10-CM | POA: Diagnosis not present

## 2024-07-05 DIAGNOSIS — Z952 Presence of prosthetic heart valve: Secondary | ICD-10-CM | POA: Diagnosis not present

## 2024-07-05 DIAGNOSIS — C3492 Malignant neoplasm of unspecified part of left bronchus or lung: Secondary | ICD-10-CM | POA: Diagnosis not present

## 2024-07-05 DIAGNOSIS — Z7901 Long term (current) use of anticoagulants: Secondary | ICD-10-CM | POA: Diagnosis not present

## 2024-07-05 DIAGNOSIS — Q2381 Bicuspid aortic valve: Secondary | ICD-10-CM | POA: Diagnosis not present

## 2024-07-05 DIAGNOSIS — I739 Peripheral vascular disease, unspecified: Secondary | ICD-10-CM | POA: Diagnosis not present

## 2024-07-05 DIAGNOSIS — I5022 Chronic systolic (congestive) heart failure: Secondary | ICD-10-CM | POA: Diagnosis not present

## 2024-07-05 DIAGNOSIS — Z87891 Personal history of nicotine dependence: Secondary | ICD-10-CM | POA: Insufficient documentation

## 2024-07-05 DIAGNOSIS — Z7984 Long term (current) use of oral hypoglycemic drugs: Secondary | ICD-10-CM | POA: Insufficient documentation

## 2024-07-05 DIAGNOSIS — R7989 Other specified abnormal findings of blood chemistry: Secondary | ICD-10-CM | POA: Diagnosis not present

## 2024-07-05 DIAGNOSIS — I2510 Atherosclerotic heart disease of native coronary artery without angina pectoris: Secondary | ICD-10-CM | POA: Diagnosis not present

## 2024-07-05 DIAGNOSIS — R911 Solitary pulmonary nodule: Secondary | ICD-10-CM | POA: Diagnosis not present

## 2024-07-05 DIAGNOSIS — I428 Other cardiomyopathies: Secondary | ICD-10-CM | POA: Insufficient documentation

## 2024-07-05 DIAGNOSIS — I110 Hypertensive heart disease with heart failure: Secondary | ICD-10-CM | POA: Diagnosis not present

## 2024-07-05 DIAGNOSIS — Z953 Presence of xenogenic heart valve: Secondary | ICD-10-CM | POA: Insufficient documentation

## 2024-07-05 DIAGNOSIS — Z79899 Other long term (current) drug therapy: Secondary | ICD-10-CM | POA: Insufficient documentation

## 2024-07-05 DIAGNOSIS — I350 Nonrheumatic aortic (valve) stenosis: Secondary | ICD-10-CM | POA: Diagnosis not present

## 2024-07-05 DIAGNOSIS — I480 Paroxysmal atrial fibrillation: Secondary | ICD-10-CM | POA: Diagnosis not present

## 2024-07-05 DIAGNOSIS — G4733 Obstructive sleep apnea (adult) (pediatric): Secondary | ICD-10-CM | POA: Diagnosis not present

## 2024-07-05 DIAGNOSIS — J439 Emphysema, unspecified: Secondary | ICD-10-CM | POA: Diagnosis not present

## 2024-07-05 MED ORDER — BISOPROLOL FUMARATE 5 MG PO TABS: 2.5000 mg | ORAL_TABLET | Freq: Every evening | ORAL | 11 refills | Status: AC

## 2024-07-05 NOTE — Patient Instructions (Signed)
 START Bisoprolol  2.5 mg ( 1/2 Tab) nightly.  Your physician has requested that you have an echocardiogram. Echocardiography is a painless test that uses sound waves to create images of your heart. It provides your doctor with information about the size and shape of your heart and how well your hearts chambers and valves are working. This procedure takes approximately one hour. There are no restrictions for this procedure. Please do NOT wear cologne, perfume, aftershave, or lotions (deodorant is allowed). Please arrive 15 minutes prior to your appointment time.  Please note: We ask at that you not bring children with you during ultrasound (echo/ vascular) testing. Due to room size and safety concerns, children are not allowed in the ultrasound rooms during exams. Our front office staff cannot provide observation of children in our lobby area while testing is being conducted. An adult accompanying a patient to their appointment will only be allowed in the ultrasound room at the discretion of the ultrasound technician under special circumstances. We apologize for any inconvenience.  Your provider has ordered ultra sounds of your legs. You will be called to have these test arranged.  You have been referred back to Dr. Darron. His office will call you to arrange your appointment.  Your physician recommends that you schedule a follow-up appointment in: 3 months ( April) ** PLEASE CALL THE OFFICE IN [place] TO ARRANGE YOUR FOLLOW UP APPOINTMENT.**   If you have any questions or concerns before your next appointment please send us  a message through [place] or call our office at [phone].    TO LEAVE A MESSAGE FOR THE NURSE SELECT OPTION 2, PLEASE LEAVE A MESSAGE INCLUDING: YOUR NAME DATE OF BIRTH CALL BACK NUMBER REASON FOR CALL**this is important as we prioritize the call backs  YOU WILL RECEIVE A CALL BACK THE SAME DAY AS LONG AS YOU CALL BEFORE 4:00 PM  At the [facility],  you and your health needs are our priority. As part of our continuing mission to provide you with exceptional heart care, we have created designated Provider Care Teams. These Care Teams include your primary Cardiologist (physician) and Advanced Practice Providers (APPs- Physician Assistants and Nurse Practitioners) who all work together to provide you with the care you need, when you need it.   You may see any of the following providers on your designated Care Team at your next follow up: Dr Toribio Fuel Dr Ezra Shuck Dr. Morene Brownie Greig Mosses, NP Caffie Shed, [place] [facility] [place], [place] Beckey Coe, NP Jordan Lee, NP Ellouise Class, NP Tinnie Redman, PharmD Jaun Bash, PharmD   Please be sure to bring in all your medications bottles to every appointment.    Thank you for choosing [facility]

## 2024-07-13 ENCOUNTER — Ambulatory Visit: Admitting: Cardiovascular Disease

## 2024-07-22 ENCOUNTER — Ambulatory Visit
Admission: RE | Admit: 2024-07-22 | Discharge: 2024-07-22 | Disposition: A | Discharge status: Home / Self Care | Source: Ambulatory Visit | Attending: Radiation Oncology | Admitting: Radiation Oncology

## 2024-07-22 DIAGNOSIS — C3412 Malignant neoplasm of upper lobe, left bronchus or lung: Secondary | ICD-10-CM | POA: Diagnosis not present

## 2024-07-22 DIAGNOSIS — Z510 Encounter for antineoplastic radiation therapy: Secondary | ICD-10-CM | POA: Insufficient documentation

## 2024-07-23 ENCOUNTER — Ambulatory Visit (HOSPITAL_COMMUNITY)
Admission: RE | Admit: 2024-07-23 | Discharge: 2024-07-23 | Disposition: A | Source: Ambulatory Visit | Attending: Vascular Surgery

## 2024-07-23 ENCOUNTER — Ambulatory Visit (HOSPITAL_COMMUNITY)
Admission: RE | Admit: 2024-07-23 | Discharge: 2024-07-23 | Disposition: A | Source: Ambulatory Visit | Attending: Family Medicine | Admitting: Family Medicine

## 2024-07-23 DIAGNOSIS — I739 Peripheral vascular disease, unspecified: Secondary | ICD-10-CM

## 2024-07-24 LAB — VAS US ABI WITH/WO TBI
Left ABI: 0.65
Right ABI: 0.53

## 2024-07-27 ENCOUNTER — Encounter: Payer: Self-pay | Admitting: Cardiovascular Disease

## 2024-07-27 ENCOUNTER — Ambulatory Visit: Admitting: Cardiovascular Disease

## 2024-07-27 ENCOUNTER — Ambulatory Visit (HOSPITAL_COMMUNITY): Payer: Self-pay | Admitting: Family Medicine

## 2024-07-27 VITALS — BP 126/52 | HR 56 | Ht 69.0 in | Wt 175.0 lb

## 2024-07-27 DIAGNOSIS — E785 Hyperlipidemia, unspecified: Secondary | ICD-10-CM | POA: Insufficient documentation

## 2024-07-27 DIAGNOSIS — I480 Paroxysmal atrial fibrillation: Secondary | ICD-10-CM | POA: Diagnosis not present

## 2024-07-27 DIAGNOSIS — I5022 Chronic systolic (congestive) heart failure: Secondary | ICD-10-CM | POA: Insufficient documentation

## 2024-07-27 DIAGNOSIS — I739 Peripheral vascular disease, unspecified: Secondary | ICD-10-CM | POA: Diagnosis not present

## 2024-07-27 NOTE — Patient Instructions (Signed)
 Medication Instructions:  No changes *If you need a refill on your cardiac medications before your next appointment, please call your pharmacy*  Lab Work: None ordered If you have labs (blood work) drawn today and your tests are completely normal, you will receive your results only by: MyChart Message (if you have MyChart) OR A paper copy in the mail If you have any lab test that is abnormal or we need to change your treatment, we will call you to review the results.  Testing/Procedures: None ordered  Follow-Up: At [facility], you and your health needs are our priority.  As part of our continuing mission to provide you with exceptional heart care, our providers are all part of one team.  This team includes your primary Cardiologist (physician) and Advanced Practice Providers or APPs (Physician Assistants and Nurse Practitioners) who all work together to provide you with the care you need, when you need it.  Your next appointment:   6 month(s)  Provider:   Dr. Alvenia Aus  We recommend signing up for the patient portal called "MyChart".  Sign up information is provided on this After Visit Summary.  MyChart is used to connect with patients for Virtual Visits (Telemedicine).  Patients are able to view lab/test results, encounter notes, upcoming appointments, etc.  Non-urgent messages can be sent to your provider as well.   To learn more about what you can do with MyChart, go to [email or web address].   Other Instructions EXERCISE PROGRAM FOR INDIVIDUALS WITH  PERIPHERAL ARTERIAL DISEASE (PAD)   General Information:   Research in vascular exercise has demonstrated remarkable improvement in symptoms of leg pain (claudication) without expensive or invasive interventions. Regular walking programs are extremely helpful for patients with PAD and intermittent claudication.  These steps are designed to help you get started with a safe and effective program to help you walk farther with  less pain:   Walk at least three times a week (preferably every day).  Your goal is to build up to 30-45 minutes of total walking time (not counting rest breaks). It may take you several weeks to build up your exercise time starting at 5-10 minutes or whatever you can tolerate.  Walk as far as possible using moderate to maximal pain (7-8 on the scale below) as a signal to stop, and resume walking when the pain goes away.  On a treadmill, set the speed and grade at a level that brings on the claudication pain within 3 to 5 minutes. Walk at this rate until you experience claudication of moderate severity, rest until the pain improves, and then resume walking.  Over time, you will be able to walk longer at the designated speed and grade; workload should then be increased until you develop the pain within 3 to 5 minutes once again.  This regimen will induce a significant benefit. Studies have demonstrated that participants may be able to walk up to three or four times farther and have less leg pain, within twelve weeks, by following this protocol.  Pain Scale    0_____1_____2_____3_____4_____5_____6_____7_____8_____9_____10   No Pain                                   Moderate Pain                               Maximal Pain

## 2024-07-27 NOTE — Progress Notes (Signed)
 "    Cardiology Office Note   Date:  07/27/2024   ID:  [name], DOB 1956/06/29, MRN [id number]  PCP:  Sabas Norleen PARAS., MD  Cardiologist:  Dr. Mona  No chief complaint on file.     History of Present Illness: [name] is a 69 y.o. male who was referred for evaluation management of peripheral arterial disease.  I saw him in the past for this most recently in 2019 with no follow-up with me since then.  I The patient has known history of chronic systolic heart failure due to nonischemic cardiomyopathy in the setting of severe aortic stenosis due to bicuspid aortic valve. He underwent successful aortic valve replacement in July 2017 with subsequent improvement in LV systolic function. He has other chronic medical conditions that include tobacco use. He quit smoking in June 2018. He is not diabetic. The patient is status post left common femoral artery endarterectomy in 2018 for severe left leg claudication. Left leg claudication resolved.  He was hospitalized in October 2025 with heart failure and was found to have an EF of 20 to 25% with severe prostatic valve stenosis with mean gradient of 49 mmHg.  Pre-TAVR workup revealed incidental finding of spiculated left apical pulmonary nodule concerning for malignancy.  No evidence of metastatic disease.  He underwent TAVR.  He quit smoking after that. He reports significant improvement in shortness of breath.  However, he reports gradual worsening of bilateral calf claudication especially on the left.  This happens after walking 1-2 blocks.  He usually has to rest for few minutes.  No rest pain or lower extremity ulceration.  He underwent recent lower extremity arterial Doppler studies which showed a significant drop in ABI bilaterally from the normal range to 0.53 on the right and 0.65 on the left.  Duplex on the right showed significant SFAs disease.  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 Wendel and Dr. Daniel   Severe aortic stenosis     Past Surgical History:  Procedure Laterality Date   ABDOMINAL AORTOGRAM W/LOWER EXTREMITY N/A 10/16/2016   Procedure: Abdominal Aortogram w/Lower Extremity;  Surgeon: Deatrice DELENA Cage, MD;  Location: [facility] INVASIVE CV LAB;  Service: Cardiovascular;  Laterality: N/A;   AORTIC VALVE REPLACEMENT N/A 01/29/2016   Procedure: AORTIC VALVE REPLACEMENT (AVR) with Magna Ease size 25mm;  Surgeon: Maude Fleeta Ochoa, MD;  Location: [facility] OR;  Service: Open Heart Surgery;  Laterality: N/A;   CARDIAC CATHETERIZATION N/A 01/23/2016   Procedure: Right/Left Heart Cath and Coronary Angiography;  Surgeon: Vinie JAYSON Mona, MD;  Location: [facility] INVASIVE CV LAB;  Service: Cardiovascular;  Laterality: N/A;   CARDIOVERSION N/A 03/26/2016   Procedure: CARDIOVERSION;  Surgeon: Vinie JAYSON Mona, MD;  Location: [facility] ENDOSCOPY;  Service: Cardiovascular;  Laterality: N/A;   CATARACT EXTRACTION  12/2002 & 09/2007   ENDARTERECTOMY FEMORAL Left 11/01/2016   Procedure: ENDARTERECTOMY FEMORAL;  Surgeon: Serene Gaile ORN, MD;  Location: [facility] OR;  Service: Vascular;  Laterality: Left;   INTRAOPERATIVE TRANSTHORACIC ECHOCARDIOGRAM N/A 04/27/2024   Procedure: ECHOCARDIOGRAM, TRANSTHORACIC;  Surgeon: Wendel Lurena POUR, MD;  Location: [facility] INVASIVE CV LAB;  Service: Cardiovascular;   Laterality: N/A;   PATCH ANGIOPLASTY Left 11/01/2016   Procedure: PATCH ANGIOPLASTY;  Surgeon: Serene Gaile ORN, MD;  Location: [facility] OR;  Service: Vascular;  Laterality: Left;   RIGHT HEART CATH AND CORONARY ANGIOGRAPHY N/A 04/20/2024   Procedure: RIGHT HEART CATH AND CORONARY ANGIOGRAPHY;  Surgeon: Rolan Ezra RAMAN, MD;  Location: [facility] INVASIVE CV LAB;  Service: Cardiovascular;  Laterality: N/A;   SHOULDER SURGERY Right 07/01/1969   TEE WITHOUT CARDIOVERSION N/A 01/29/2016   Procedure: TRANSESOPHAGEAL ECHOCARDIOGRAM (TEE);  Surgeon: Maude Fleeta Ochoa, MD;  Location: [facility] OR;  Service: Open Heart Surgery;  Laterality: N/A;   VIDEO BRONCHOSCOPY WITH ENDOBRONCHIAL NAVIGATION Left 06/03/2024   Procedure: VIDEO BRONCHOSCOPY WITH ENDOBRONCHIAL NAVIGATION;  Surgeon: Zaida Zola SAILOR, MD;  Location: [facility] ENDOSCOPY;  Service: Pulmonary;  Laterality: Left;     Current Outpatient Medications  Medication Sig Dispense Refill   acetaminophen  (TYLENOL ) 500 MG tablet Take 1,000 mg by mouth daily as needed for headache.     albuterol  (VENTOLIN  HFA) 108 (90 Base) MCG/ACT inhaler Inhale 2 puffs into the lungs every 4 (four) hours as needed for wheezing or shortness of breath.     apixaban  (ELIQUIS ) 5 MG TABS tablet Take 1 tablet (5 mg total) by mouth 2 (two) times daily. 60 tablet 6   atorvastatin  (LIPITOR) 10 MG tablet Take 1 tablet (10 mg total) by mouth daily. 90 tablet 3   bisoprolol  (ZEBETA ) 5 MG tablet Take 0.5 tablets (2.5 mg total) by mouth at bedtime. 15 tablet 11   empagliflozin  (JARDIANCE ) 10 MG TABS tablet Take 1 tablet (10 mg total) by mouth daily before breakfast. 30 tablet 11   finasteride  (PROSCAR ) 5 MG tablet Take 1 tablet (5 mg total) by mouth daily. 30 tablet 1   losartan  (COZAAR ) 25 MG tablet Take 0.5 tablets (12.5 mg total) by mouth daily. 45 tablet 3   spironolactone  (ALDACTONE ) 25 MG tablet Take 1 tablet (25 mg total) by mouth daily. 90 tablet 2   tamsulosin  (FLOMAX ) 0.4 MG CAPS capsule Take 0.4 mg  by mouth every evening.  1   torsemide  (DEMADEX ) 20 MG tablet Take 1 tablet (20 mg total) by mouth daily. 90 tablet 2   No current facility-administered medications for this visit.    Allergies:   Patient has no known allergies.    Social History:  The patient  reports that he quit smoking about 12 months ago. His smoking use included cigarettes. He started smoking about 53 years ago. He has a 48 pack-year smoking history. He has quit using smokeless tobacco. He reports that he does not currently use alcohol after a past usage of about 4.0 standard drinks of alcohol per week. He reports that he does not use drugs.   Family History:  The patient's family history includes Breast cancer in his mother and sister.    ROS:  Please see the history of present illness.   Otherwise, review of systems are positive for none.   All other systems are reviewed and negative.    PHYSICAL EXAM: VS:  BP (!) 126/52  Pulse (!) 56   Ht 5' 9 (1.753 m)   Wt 175 lb (79.4 kg)   SpO2 98%   BMI 25.84 kg/m  , BMI Body mass index is 25.84 kg/m. GEN: Well nourished, well developed, in no acute distress  HEENT: normal  Neck: no JVD, carotid bruits, or masses Cardiac: RRR; no  rubs, or gallops,no edema.  2 out of 6 systolic murmur at the base. Respiratory:  clear to auscultation bilaterally, normal work of breathing GI: soft, nontender, nondistended, + BS MS: no deformity or atrophy  Skin: warm and dry, no rash Neuro:  Strength and sensation are intact Psych: euthymic mood, full affect Vascular : Femoral pulse: +2 on the right and +1 on the left.  Distal pulses are not palpable.  EKG:  EKG is not ordered today.    Recent Labs: 05/04/2024: Magnesium  1.9 06/11/2024: B Natriuretic Peptide 106.9 06/16/2024: ALT 34; BUN 25; Creatinine 0.85; Hemoglobin 14.3; Platelet Count 105; Potassium 4.1; Sodium 139    Lipid Panel    Component Value Date/Time   CHOL 108 04/20/2024 0449   CHOL 138 01/10/2017 0941    TRIG 68 04/20/2024 0449   HDL 52 04/20/2024 0449   HDL 68 01/10/2017 0941   CHOLHDL 2.1 04/20/2024 0449   VLDL 14 04/20/2024 0449   LDLCALC 42 04/20/2024 0449   LDLCALC 57 01/10/2017 0941      Wt Readings from Last 3 Encounters:  07/27/24 175 lb (79.4 kg)  07/05/24 173 lb 3.2 oz (78.6 kg)  06/17/24 172 lb 1.6 oz (78.1 kg)          10/08/2016   11:00 AM  PAD Screen  Previous PAD dx? No  Previous surgical procedure? No  Pain with walking? Yes  Subsides with rest? No  Feet/toe relief with dangling? No  Painful, non-healing ulcers? No  Extremities discolored? No      ASSESSMENT AND PLAN:  1.  Peripheral arterial disease moderate to severe bilateral calf claudication: This is due to SFA and popliteal disease.  Fortunately, his symptoms do not seem to be lifestyle-limiting.  Recommend a walking exercise program and treatment of risk factors.  I offered him to participate in the supervised exercise therapy program but he is not able to attend as he is about to start treatment for lung cancer.  2. Chronic systolic heart failure: He appears to be euvolemic on current dose of furosemide .  3. Status post aortic valve replacement with bioprosthetic valve with recent valve in valve TAVR for severe stenosis.  He seems to be doing better.  4. Paroxysmal atrial fibrillation: On long-term anticoagulation with Eliquis .  5. Hyperlipidemia: Continue treatment with atorvastatin  .  I reviewed most recent lipid profile which showed an LDL of 42.  6.  Prolonged history of tobacco use: He quit again in October.  I discussed with him the importance of complete abstinence.   Disposition:   FU with me in 6 months  Signed,  Deatrice Cage, MD  07/27/2024 9:05 AM     [facility] "

## 2024-07-28 DIAGNOSIS — Z510 Encounter for antineoplastic radiation therapy: Secondary | ICD-10-CM | POA: Diagnosis not present

## 2024-08-03 ENCOUNTER — Ambulatory Visit: Admitting: Radiation Oncology

## 2024-08-04 ENCOUNTER — Ambulatory Visit
Admission: RE | Admit: 2024-08-04 | Discharge: 2024-08-04 | Attending: Radiation Oncology | Admitting: Radiation Oncology

## 2024-08-04 ENCOUNTER — Other Ambulatory Visit: Payer: Self-pay

## 2024-08-04 LAB — RAD ONC ARIA SESSION SUMMARY
Course Elapsed Days: 0
Plan Fractions Treated to Date: 1
Plan Prescribed Dose Per Fraction: 18 Gy
Plan Total Fractions Prescribed: 3
Plan Total Prescribed Dose: 54 Gy
Reference Point Dosage Given to Date: 18 Gy
Reference Point Session Dosage Given: 18 Gy
Session Number: 1

## 2024-08-05 ENCOUNTER — Ambulatory Visit: Admitting: Radiation Oncology

## 2024-08-09 ENCOUNTER — Ambulatory Visit: Admitting: Radiation Oncology

## 2024-08-11 ENCOUNTER — Ambulatory Visit: Admitting: Radiation Oncology

## 2024-10-06 ENCOUNTER — Inpatient Hospital Stay

## 2024-10-13 ENCOUNTER — Inpatient Hospital Stay: Admitting: Internal Medicine

## 2025-04-11 ENCOUNTER — Ambulatory Visit (HOSPITAL_COMMUNITY)

## 2025-04-11 ENCOUNTER — Ambulatory Visit: Admitting: Physician Assistant
